# Patient Record
Sex: Female | Born: 1945
Health system: Southern US, Community
[De-identification: ages and names within clinical notes are randomized; demographics above are authoritative.]

## PROBLEM LIST (undated history)

## (undated) DIAGNOSIS — J189 Pneumonia, unspecified organism: Secondary | ICD-10-CM

## (undated) DIAGNOSIS — K5790 Diverticulosis of intestine, part unspecified, without perforation or abscess without bleeding: Secondary | ICD-10-CM

## (undated) DIAGNOSIS — G4730 Sleep apnea, unspecified: Secondary | ICD-10-CM

## (undated) DIAGNOSIS — K529 Noninfective gastroenteritis and colitis, unspecified: Secondary | ICD-10-CM

## (undated) DIAGNOSIS — J329 Chronic sinusitis, unspecified: Secondary | ICD-10-CM

## (undated) DIAGNOSIS — C50919 Malignant neoplasm of unspecified site of unspecified female breast: Secondary | ICD-10-CM

## (undated) DIAGNOSIS — E785 Hyperlipidemia, unspecified: Secondary | ICD-10-CM

## (undated) DIAGNOSIS — G5601 Carpal tunnel syndrome, right upper limb: Secondary | ICD-10-CM

## (undated) DIAGNOSIS — I890 Lymphedema, not elsewhere classified: Secondary | ICD-10-CM

## (undated) DIAGNOSIS — M1990 Unspecified osteoarthritis, unspecified site: Secondary | ICD-10-CM

## (undated) DIAGNOSIS — K635 Polyp of colon: Secondary | ICD-10-CM

## (undated) DIAGNOSIS — K120 Recurrent oral aphthae: Secondary | ICD-10-CM

## (undated) DIAGNOSIS — IMO0002 Reserved for concepts with insufficient information to code with codable children: Secondary | ICD-10-CM

## (undated) DIAGNOSIS — I10 Essential (primary) hypertension: Secondary | ICD-10-CM

## (undated) DIAGNOSIS — T7840XA Allergy, unspecified, initial encounter: Secondary | ICD-10-CM

## (undated) DIAGNOSIS — K219 Gastro-esophageal reflux disease without esophagitis: Secondary | ICD-10-CM

## (undated) HISTORY — DX: Allergy, unspecified, initial encounter: T78.40XA

## (undated) HISTORY — DX: Carpal tunnel syndrome, right upper limb: G56.01

## (undated) HISTORY — PX: SKIN GRAFT: SHX250

## (undated) HISTORY — DX: Diverticulosis of intestine, part unspecified, without perforation or abscess without bleeding: K57.90

## (undated) HISTORY — PX: AUGMENTATION MAMMAPLASTY: SUR837

## (undated) HISTORY — DX: Hyperlipidemia, unspecified: E78.5

## (undated) HISTORY — DX: Noninfective gastroenteritis and colitis, unspecified: K52.9

## (undated) HISTORY — DX: Essential (primary) hypertension: I10

## (undated) HISTORY — DX: Reserved for concepts with insufficient information to code with codable children: IMO0002

## (undated) HISTORY — PX: MASTECTOMY: SHX3

## (undated) HISTORY — DX: Sleep apnea, unspecified: G47.30

## (undated) HISTORY — DX: Polyp of colon: K63.5

## (undated) HISTORY — PX: TUBAL LIGATION: SHX77

## (undated) HISTORY — PX: REDUCTION MAMMAPLASTY: SUR839

## (undated) HISTORY — DX: Pneumonia, unspecified organism: J18.9

## (undated) HISTORY — DX: Unspecified osteoarthritis, unspecified site: M19.90

## (undated) HISTORY — DX: Lymphedema, not elsewhere classified: I89.0

## (undated) HISTORY — PX: TONSILLECTOMY: SUR1361

## (undated) HISTORY — PX: RECONSTRUCTION BREAST IMMEDIATE / DELAYED W/ TISSUE EXPANDER: SUR1077

## (undated) HISTORY — DX: Malignant neoplasm of unspecified site of unspecified female breast: C50.919

## (undated) HISTORY — PX: SQUAMOUS CELL CARCINOMA EXCISION: SHX2433

## (undated) HISTORY — DX: Gastro-esophageal reflux disease without esophagitis: K21.9

## (undated) HISTORY — DX: Recurrent oral aphthae: K12.0

## (undated) HISTORY — DX: Chronic sinusitis, unspecified: J32.9

---

## 1998-10-19 ENCOUNTER — Other Ambulatory Visit: Admission: RE | Admit: 1998-10-19 | Discharge: 1998-10-19 | Payer: Self-pay | Admitting: Obstetrics and Gynecology

## 2001-12-08 ENCOUNTER — Other Ambulatory Visit: Admission: RE | Admit: 2001-12-08 | Discharge: 2001-12-08 | Payer: Self-pay | Admitting: Obstetrics and Gynecology

## 2002-10-13 LAB — HM COLONOSCOPY

## 2003-01-13 ENCOUNTER — Other Ambulatory Visit: Admission: RE | Admit: 2003-01-13 | Discharge: 2003-01-13 | Payer: Self-pay | Admitting: Obstetrics and Gynecology

## 2004-03-27 ENCOUNTER — Other Ambulatory Visit: Admission: RE | Admit: 2004-03-27 | Discharge: 2004-03-27 | Payer: Self-pay | Admitting: Obstetrics and Gynecology

## 2004-11-07 ENCOUNTER — Ambulatory Visit: Payer: Self-pay | Admitting: Internal Medicine

## 2004-11-14 ENCOUNTER — Ambulatory Visit: Payer: Self-pay | Admitting: Internal Medicine

## 2005-02-03 ENCOUNTER — Ambulatory Visit: Payer: Self-pay | Admitting: Internal Medicine

## 2005-04-02 ENCOUNTER — Other Ambulatory Visit: Admission: RE | Admit: 2005-04-02 | Discharge: 2005-04-02 | Payer: Self-pay | Admitting: Obstetrics and Gynecology

## 2005-04-04 ENCOUNTER — Ambulatory Visit: Payer: Self-pay | Admitting: Internal Medicine

## 2005-06-30 ENCOUNTER — Ambulatory Visit: Payer: Self-pay | Admitting: Internal Medicine

## 2006-03-13 ENCOUNTER — Ambulatory Visit: Payer: Self-pay | Admitting: Internal Medicine

## 2006-03-26 ENCOUNTER — Ambulatory Visit: Payer: Self-pay | Admitting: Internal Medicine

## 2006-09-15 ENCOUNTER — Ambulatory Visit: Payer: Self-pay | Admitting: Internal Medicine

## 2006-09-15 LAB — CONVERTED CEMR LAB
HDL: 42.5 mg/dL (ref >39.0)
Triglyceride fasting, serum: 71 mg/dL (ref 0–149)

## 2006-09-30 ENCOUNTER — Ambulatory Visit: Payer: Self-pay | Admitting: Internal Medicine

## 2007-02-15 ENCOUNTER — Encounter: Payer: Self-pay | Admitting: Internal Medicine

## 2007-02-15 DIAGNOSIS — K219 Gastro-esophageal reflux disease without esophagitis: Secondary | ICD-10-CM | POA: Insufficient documentation

## 2007-02-15 DIAGNOSIS — I10 Essential (primary) hypertension: Secondary | ICD-10-CM | POA: Insufficient documentation

## 2007-02-15 DIAGNOSIS — R7301 Impaired fasting glucose: Secondary | ICD-10-CM | POA: Insufficient documentation

## 2007-02-15 DIAGNOSIS — E785 Hyperlipidemia, unspecified: Secondary | ICD-10-CM | POA: Insufficient documentation

## 2007-02-15 DIAGNOSIS — M1990 Unspecified osteoarthritis, unspecified site: Secondary | ICD-10-CM | POA: Insufficient documentation

## 2007-02-15 DIAGNOSIS — J309 Allergic rhinitis, unspecified: Secondary | ICD-10-CM | POA: Insufficient documentation

## 2007-04-07 ENCOUNTER — Ambulatory Visit: Payer: Self-pay | Admitting: Internal Medicine

## 2007-04-07 LAB — CONVERTED CEMR LAB
Bilirubin, Direct: 0.1 mg/dL (ref 0.0–0.3)
Hgb A1c MFr Bld: 6.5 % — ABNORMAL HIGH (ref 4.6–6.0)
Total Protein: 6.8 g/dL (ref 6.0–8.3)

## 2007-04-26 ENCOUNTER — Ambulatory Visit: Payer: Self-pay | Admitting: Internal Medicine

## 2007-11-02 ENCOUNTER — Encounter: Payer: Self-pay | Admitting: Internal Medicine

## 2007-12-07 ENCOUNTER — Ambulatory Visit: Payer: Self-pay | Admitting: Internal Medicine

## 2007-12-14 ENCOUNTER — Telehealth: Payer: Self-pay | Admitting: Internal Medicine

## 2007-12-14 LAB — CONVERTED CEMR LAB
Chloride: 101 meq/L (ref 96–112)
Creatinine, Ser: 0.8 mg/dL (ref 0.4–1.2)
Glucose, Bld: 126 mg/dL — ABNORMAL HIGH (ref 70–99)
HDL: 54.1 mg/dL (ref >39.0)
Hgb A1c MFr Bld: 7 % — ABNORMAL HIGH (ref 4.6–6.0)
LDL Cholesterol: 88 mg/dL (ref 0–99)
TSH: 1.98 microintl units/mL (ref 0.35–5.50)
Total CHOL/HDL Ratio: 3
Triglycerides: 113 mg/dL (ref 0–149)

## 2007-12-22 ENCOUNTER — Ambulatory Visit: Payer: Self-pay | Admitting: Internal Medicine

## 2007-12-22 DIAGNOSIS — M79609 Pain in unspecified limb: Secondary | ICD-10-CM | POA: Insufficient documentation

## 2007-12-22 DIAGNOSIS — F4389 Other reactions to severe stress: Secondary | ICD-10-CM | POA: Insufficient documentation

## 2007-12-22 DIAGNOSIS — F438 Other reactions to severe stress: Secondary | ICD-10-CM

## 2007-12-22 DIAGNOSIS — T887XXA Unspecified adverse effect of drug or medicament, initial encounter: Secondary | ICD-10-CM | POA: Insufficient documentation

## 2008-01-03 ENCOUNTER — Telehealth: Payer: Self-pay | Admitting: Internal Medicine

## 2008-02-14 ENCOUNTER — Ambulatory Visit: Payer: Self-pay | Admitting: Internal Medicine

## 2008-02-14 DIAGNOSIS — N390 Urinary tract infection, site not specified: Secondary | ICD-10-CM | POA: Insufficient documentation

## 2008-02-14 LAB — CONVERTED CEMR LAB
Bilirubin Urine: NEGATIVE
Glucose, Urine, Semiquant: NEGATIVE
Nitrite: POSITIVE
Specific Gravity, Urine: 1.015
Urobilinogen, UA: 1

## 2008-02-15 ENCOUNTER — Encounter: Payer: Self-pay | Admitting: Internal Medicine

## 2008-04-28 ENCOUNTER — Ambulatory Visit: Payer: Self-pay | Admitting: Internal Medicine

## 2008-04-28 LAB — CONVERTED CEMR LAB
AST: 32 units/L (ref 0–37)
Albumin: 4.3 g/dL (ref 3.5–5.2)
Bilirubin, Direct: 0.1 mg/dL (ref 0.0–0.3)
Cholesterol: 176 mg/dL (ref 0–200)
Creatinine,U: 140 mg/dL
HDL: 48.6 mg/dL (ref >39.0)
Hgb A1c MFr Bld: 6.9 % — ABNORMAL HIGH (ref 4.6–6.0)
Microalb, Ur: 0.6 mg/dL (ref 0.0–1.9)
TSH: 1.5 microintl units/mL (ref 0.35–5.50)
Total CHOL/HDL Ratio: 3.6
Total Protein: 6.9 g/dL (ref 6.0–8.3)
Triglycerides: 91 mg/dL (ref 0–149)

## 2008-05-05 ENCOUNTER — Ambulatory Visit: Payer: Self-pay | Admitting: Internal Medicine

## 2008-08-30 ENCOUNTER — Ambulatory Visit: Payer: Self-pay | Admitting: Internal Medicine

## 2008-08-30 LAB — CONVERTED CEMR LAB
AST: 31 units/L (ref 0–37)
Albumin: 4.2 g/dL (ref 3.5–5.2)
Total Protein: 6.9 g/dL (ref 6.0–8.3)

## 2008-09-05 ENCOUNTER — Telehealth: Payer: Self-pay | Admitting: Internal Medicine

## 2008-09-12 ENCOUNTER — Ambulatory Visit: Payer: Self-pay | Admitting: Internal Medicine

## 2008-10-13 DIAGNOSIS — C50919 Malignant neoplasm of unspecified site of unspecified female breast: Secondary | ICD-10-CM

## 2008-10-13 DIAGNOSIS — K120 Recurrent oral aphthae: Secondary | ICD-10-CM

## 2008-10-13 HISTORY — DX: Malignant neoplasm of unspecified site of unspecified female breast: C50.919

## 2008-10-13 HISTORY — DX: Recurrent oral aphthae: K12.0

## 2009-02-14 ENCOUNTER — Ambulatory Visit: Payer: Self-pay | Admitting: Internal Medicine

## 2009-02-14 LAB — CONVERTED CEMR LAB
AST: 25 units/L (ref 0–37)
Alkaline Phosphatase: 58 units/L (ref 39–117)
BUN: 12 mg/dL (ref 6–23)
Creatinine, Ser: 0.7 mg/dL (ref 0.4–1.2)
Creatinine,U: 122.8 mg/dL
Glucose, Bld: 145 mg/dL — ABNORMAL HIGH (ref 70–99)
HDL: 52.6 mg/dL (ref >39.00)
Hgb A1c MFr Bld: 6.6 % — ABNORMAL HIGH (ref 4.6–6.5)
Microalb Creat Ratio: 4.9 mg/g (ref 0.0–30.0)
Microalb, Ur: 0.6 mg/dL (ref 0.0–1.9)
Potassium: 4.3 meq/L (ref 3.5–5.1)
Total Bilirubin: 1.1 mg/dL (ref 0.3–1.2)

## 2009-02-21 ENCOUNTER — Ambulatory Visit: Payer: Self-pay | Admitting: Internal Medicine

## 2009-02-21 DIAGNOSIS — J310 Chronic rhinitis: Secondary | ICD-10-CM | POA: Insufficient documentation

## 2009-03-01 ENCOUNTER — Encounter: Payer: Self-pay | Admitting: Internal Medicine

## 2009-04-04 ENCOUNTER — Encounter: Admission: RE | Admit: 2009-04-04 | Discharge: 2009-04-04 | Payer: Self-pay | Admitting: Obstetrics and Gynecology

## 2009-04-04 ENCOUNTER — Encounter (INDEPENDENT_AMBULATORY_CARE_PROVIDER_SITE_OTHER): Payer: Self-pay | Admitting: Diagnostic Radiology

## 2009-04-07 ENCOUNTER — Telehealth: Payer: Self-pay | Admitting: Internal Medicine

## 2009-04-07 DIAGNOSIS — C50919 Malignant neoplasm of unspecified site of unspecified female breast: Secondary | ICD-10-CM | POA: Insufficient documentation

## 2009-04-10 ENCOUNTER — Encounter: Payer: Self-pay | Admitting: Internal Medicine

## 2009-04-11 ENCOUNTER — Encounter: Admission: RE | Admit: 2009-04-11 | Discharge: 2009-04-11 | Payer: Self-pay | Admitting: Obstetrics and Gynecology

## 2009-04-12 ENCOUNTER — Ambulatory Visit: Payer: Self-pay | Admitting: Oncology

## 2009-04-12 LAB — COMPREHENSIVE METABOLIC PANEL
ALT: 26 U/L (ref 0–35)
AST: 23 U/L (ref 0–37)
Alkaline Phosphatase: 67 U/L (ref 39–117)
CO2: 25 mEq/L (ref 19–32)
Creatinine, Ser: 0.66 mg/dL (ref 0.40–1.20)
Sodium: 140 mEq/L (ref 135–145)
Total Bilirubin: 1.2 mg/dL (ref 0.3–1.2)

## 2009-04-12 LAB — CBC WITH DIFFERENTIAL/PLATELET
BASO%: 0.4 % (ref 0.0–2.0)
EOS%: 1.6 % (ref 0.0–7.0)
HCT: 41.1 % (ref 34.8–46.6)
LYMPH%: 28.1 % (ref 14.0–49.7)
MCH: 32 pg (ref 25.1–34.0)
MCHC: 35.5 g/dL (ref 31.5–36.0)
MCV: 90.1 fL (ref 79.5–101.0)
MONO%: 7.6 % (ref 0.0–14.0)
NEUT%: 62.3 % (ref 38.4–76.8)
Platelets: 219 10*3/uL (ref 145–400)
RBC: 4.57 10*6/uL (ref 3.70–5.45)

## 2009-04-12 LAB — LACTATE DEHYDROGENASE: LDH: 126 U/L (ref 94–250)

## 2009-04-16 ENCOUNTER — Encounter: Payer: Self-pay | Admitting: Internal Medicine

## 2009-04-19 ENCOUNTER — Ambulatory Visit (HOSPITAL_COMMUNITY): Admission: RE | Admit: 2009-04-19 | Discharge: 2009-04-19 | Payer: Self-pay | Admitting: Oncology

## 2009-04-19 ENCOUNTER — Encounter: Payer: Self-pay | Admitting: Oncology

## 2009-04-20 ENCOUNTER — Ambulatory Visit (HOSPITAL_COMMUNITY): Admission: RE | Admit: 2009-04-20 | Discharge: 2009-04-20 | Payer: Self-pay | Admitting: Oncology

## 2009-04-23 ENCOUNTER — Ambulatory Visit (HOSPITAL_BASED_OUTPATIENT_CLINIC_OR_DEPARTMENT_OTHER): Admission: RE | Admit: 2009-04-23 | Discharge: 2009-04-23 | Payer: Self-pay | Admitting: Surgery

## 2009-05-01 ENCOUNTER — Ambulatory Visit: Payer: Self-pay | Admitting: Internal Medicine

## 2009-05-05 ENCOUNTER — Encounter: Payer: Self-pay | Admitting: Internal Medicine

## 2009-05-05 ENCOUNTER — Ambulatory Visit (HOSPITAL_BASED_OUTPATIENT_CLINIC_OR_DEPARTMENT_OTHER): Admission: RE | Admit: 2009-05-05 | Discharge: 2009-05-05 | Payer: Self-pay | Admitting: Internal Medicine

## 2009-05-09 ENCOUNTER — Encounter: Payer: Self-pay | Admitting: Internal Medicine

## 2009-05-09 LAB — URINALYSIS, MICROSCOPIC - CHCC
Bilirubin (Urine): NEGATIVE
Ketones: NEGATIVE mg/dL
Nitrite: NEGATIVE
Specific Gravity, Urine: 1.005 (ref 1.003–1.035)

## 2009-05-09 LAB — CBC WITH DIFFERENTIAL/PLATELET
Basophils Absolute: 0 10*3/uL (ref 0.0–0.1)
Eosinophils Absolute: 0.1 10*3/uL (ref 0.0–0.5)
HCT: 36.2 % (ref 34.8–46.6)
HGB: 12.5 g/dL (ref 11.6–15.9)
MONO#: 0.6 10*3/uL (ref 0.1–0.9)
NEUT%: 79.3 % — ABNORMAL HIGH (ref 38.4–76.8)
Platelets: 144 10*3/uL — ABNORMAL LOW (ref 145–400)
WBC: 14.6 10*3/uL — ABNORMAL HIGH (ref 3.9–10.3)
lymph#: 2.3 10*3/uL (ref 0.9–3.3)

## 2009-05-09 LAB — COMPREHENSIVE METABOLIC PANEL
ALT: 17 U/L (ref 0–35)
BUN: 15 mg/dL (ref 6–23)
CO2: 26 mEq/L (ref 19–32)
Calcium: 9.4 mg/dL (ref 8.4–10.5)
Chloride: 103 mEq/L (ref 96–112)
Creatinine, Ser: 0.79 mg/dL (ref 0.40–1.20)
Glucose, Bld: 198 mg/dL — ABNORMAL HIGH (ref 70–99)

## 2009-05-12 ENCOUNTER — Ambulatory Visit: Payer: Self-pay | Admitting: Internal Medicine

## 2009-05-22 ENCOUNTER — Ambulatory Visit: Payer: Self-pay | Admitting: Oncology

## 2009-05-24 ENCOUNTER — Encounter: Payer: Self-pay | Admitting: Internal Medicine

## 2009-05-24 LAB — BASIC METABOLIC PANEL
CO2: 25 mEq/L (ref 19–32)
Chloride: 103 mEq/L (ref 96–112)
Glucose, Bld: 148 mg/dL — ABNORMAL HIGH (ref 70–99)
Potassium: 4.1 mEq/L (ref 3.5–5.3)
Sodium: 138 mEq/L (ref 135–145)

## 2009-05-24 LAB — CBC WITH DIFFERENTIAL/PLATELET
Basophils Absolute: 0.1 10*3/uL (ref 0.0–0.1)
Eosinophils Absolute: 0 10*3/uL (ref 0.0–0.5)
HGB: 11.9 g/dL (ref 11.6–15.9)
MONO#: 0.5 10*3/uL (ref 0.1–0.9)
NEUT#: 14 10*3/uL — ABNORMAL HIGH (ref 1.5–6.5)
RBC: 3.8 10*6/uL (ref 3.70–5.45)
RDW: 13.4 % (ref 11.2–14.5)
WBC: 16.4 10*3/uL — ABNORMAL HIGH (ref 3.9–10.3)
lymph#: 1.8 10*3/uL (ref 0.9–3.3)

## 2009-05-28 ENCOUNTER — Ambulatory Visit: Payer: Self-pay | Admitting: Internal Medicine

## 2009-05-28 DIAGNOSIS — G4733 Obstructive sleep apnea (adult) (pediatric): Secondary | ICD-10-CM | POA: Insufficient documentation

## 2009-05-31 ENCOUNTER — Encounter: Payer: Self-pay | Admitting: Internal Medicine

## 2009-05-31 LAB — CBC WITH DIFFERENTIAL/PLATELET
BASO%: 0.3 % (ref 0.0–2.0)
HCT: 32.5 % — ABNORMAL LOW (ref 34.8–46.6)
LYMPH%: 19.7 % (ref 14.0–49.7)
MCH: 30.5 pg (ref 25.1–34.0)
MCHC: 34.8 g/dL (ref 31.5–36.0)
MCV: 87.6 fL (ref 79.5–101.0)
MONO#: 0.1 10*3/uL (ref 0.1–0.9)
MONO%: 2.1 % (ref 0.0–14.0)
NEUT%: 77.6 % — ABNORMAL HIGH (ref 38.4–76.8)
Platelets: 91 10*3/uL — ABNORMAL LOW (ref 145–400)
WBC: 3.9 10*3/uL (ref 3.9–10.3)

## 2009-06-06 ENCOUNTER — Encounter: Payer: Self-pay | Admitting: Internal Medicine

## 2009-06-06 LAB — CBC WITH DIFFERENTIAL/PLATELET
Basophils Absolute: 0 10*3/uL (ref 0.0–0.1)
EOS%: 0.2 % (ref 0.0–7.0)
HGB: 11.3 g/dL — ABNORMAL LOW (ref 11.6–15.9)
MCH: 31.9 pg (ref 25.1–34.0)
MCV: 91.1 fL (ref 79.5–101.0)
MONO%: 2.7 % (ref 0.0–14.0)
NEUT%: 88.4 % — ABNORMAL HIGH (ref 38.4–76.8)
RDW: 14.1 % (ref 11.2–14.5)

## 2009-06-06 LAB — COMPREHENSIVE METABOLIC PANEL
AST: 22 U/L (ref 0–37)
Alkaline Phosphatase: 80 U/L (ref 39–117)
BUN: 7 mg/dL (ref 6–23)
Creatinine, Ser: 0.82 mg/dL (ref 0.40–1.20)
Potassium: 3.4 mEq/L — ABNORMAL LOW (ref 3.5–5.3)

## 2009-06-12 ENCOUNTER — Encounter: Admission: RE | Admit: 2009-06-12 | Discharge: 2009-06-12 | Payer: Self-pay | Admitting: Oncology

## 2009-06-13 LAB — HM DIABETES EYE EXAM

## 2009-06-14 ENCOUNTER — Encounter: Payer: Self-pay | Admitting: Internal Medicine

## 2009-06-14 LAB — CBC WITH DIFFERENTIAL/PLATELET
Eosinophils Absolute: 0 10*3/uL (ref 0.0–0.5)
LYMPH%: 16 % (ref 14.0–49.7)
MCHC: 34.7 g/dL (ref 31.5–36.0)
MCV: 88.3 fL (ref 79.5–101.0)
MONO%: 3.3 % (ref 0.0–14.0)
NEUT#: 2.7 10*3/uL (ref 1.5–6.5)
NEUT%: 80.1 % — ABNORMAL HIGH (ref 38.4–76.8)
Platelets: 109 10*3/uL — ABNORMAL LOW (ref 145–400)
RBC: 3.49 10*6/uL — ABNORMAL LOW (ref 3.70–5.45)

## 2009-06-19 ENCOUNTER — Ambulatory Visit: Payer: Self-pay | Admitting: Oncology

## 2009-06-21 ENCOUNTER — Encounter: Payer: Self-pay | Admitting: Internal Medicine

## 2009-06-21 LAB — CBC WITH DIFFERENTIAL/PLATELET
BASO%: 0.4 % (ref 0.0–2.0)
LYMPH%: 3.7 % — ABNORMAL LOW (ref 14.0–49.7)
MCHC: 33.9 g/dL (ref 31.5–36.0)
MONO#: 1.9 10*3/uL — ABNORMAL HIGH (ref 0.1–0.9)
Platelets: 159 10*3/uL (ref 145–400)
RBC: 3.41 10*6/uL — ABNORMAL LOW (ref 3.70–5.45)
WBC: 39 10*3/uL — ABNORMAL HIGH (ref 3.9–10.3)

## 2009-06-21 LAB — COMPREHENSIVE METABOLIC PANEL
ALT: 22 U/L (ref 0–35)
Alkaline Phosphatase: 86 U/L (ref 39–117)
CO2: 24 mEq/L (ref 19–32)
Sodium: 137 mEq/L (ref 135–145)
Total Bilirubin: 0.4 mg/dL (ref 0.3–1.2)
Total Protein: 6.1 g/dL (ref 6.0–8.3)

## 2009-06-27 ENCOUNTER — Encounter: Payer: Self-pay | Admitting: Internal Medicine

## 2009-06-27 LAB — CBC WITH DIFFERENTIAL/PLATELET
BASO%: 1.3 % (ref 0.0–2.0)
EOS%: 0.6 % (ref 0.0–7.0)
LYMPH%: 22.6 % (ref 14.0–49.7)
MCH: 30.7 pg (ref 25.1–34.0)
MCHC: 34.1 g/dL (ref 31.5–36.0)
MCV: 90.1 fL (ref 79.5–101.0)
MONO%: 5.8 % (ref 0.0–14.0)
Platelets: 140 10*3/uL — ABNORMAL LOW (ref 145–400)
RBC: 3.35 10*6/uL — ABNORMAL LOW (ref 3.70–5.45)
WBC: 3.1 10*3/uL — ABNORMAL LOW (ref 3.9–10.3)

## 2009-07-05 ENCOUNTER — Encounter: Payer: Self-pay | Admitting: Internal Medicine

## 2009-07-05 LAB — COMPREHENSIVE METABOLIC PANEL
ALT: 24 U/L (ref 0–35)
AST: 14 U/L (ref 0–37)
CO2: 21 mEq/L (ref 19–32)
Chloride: 103 mEq/L (ref 96–112)
Sodium: 137 mEq/L (ref 135–145)
Total Bilirubin: 0.5 mg/dL (ref 0.3–1.2)
Total Protein: 6.3 g/dL (ref 6.0–8.3)

## 2009-07-05 LAB — CBC WITH DIFFERENTIAL/PLATELET
BASO%: 0.5 % (ref 0.0–2.0)
EOS%: 0 % (ref 0.0–7.0)
MCHC: 33.9 g/dL (ref 31.5–36.0)
MONO#: 0.6 10*3/uL (ref 0.1–0.9)
RBC: 3.28 10*6/uL — ABNORMAL LOW (ref 3.70–5.45)
WBC: 4.3 10*3/uL (ref 3.9–10.3)
lymph#: 0.6 10*3/uL — ABNORMAL LOW (ref 0.9–3.3)

## 2009-07-12 ENCOUNTER — Encounter: Payer: Self-pay | Admitting: Internal Medicine

## 2009-07-12 LAB — CBC WITH DIFFERENTIAL/PLATELET
EOS%: 0.2 % (ref 0.0–7.0)
Eosinophils Absolute: 0 10*3/uL (ref 0.0–0.5)
HGB: 10.2 g/dL — ABNORMAL LOW (ref 11.6–15.9)
MCH: 32 pg (ref 25.1–34.0)
MCV: 91.4 fL (ref 79.5–101.0)
MONO%: 0.3 % (ref 0.0–14.0)
NEUT#: 14.5 10*3/uL — ABNORMAL HIGH (ref 1.5–6.5)
RBC: 3.19 10*6/uL — ABNORMAL LOW (ref 3.70–5.45)
RDW: 16.5 % — ABNORMAL HIGH (ref 11.2–14.5)
lymph#: 1.4 10*3/uL (ref 0.9–3.3)

## 2009-07-16 ENCOUNTER — Ambulatory Visit: Payer: Self-pay | Admitting: Oncology

## 2009-07-17 ENCOUNTER — Ambulatory Visit: Payer: Self-pay | Admitting: Internal Medicine

## 2009-07-18 ENCOUNTER — Encounter: Payer: Self-pay | Admitting: Internal Medicine

## 2009-07-18 LAB — CBC WITH DIFFERENTIAL/PLATELET
BASO%: 0.3 % (ref 0.0–2.0)
Basophils Absolute: 0 10*3/uL (ref 0.0–0.1)
EOS%: 0.2 % (ref 0.0–7.0)
HCT: 30.4 % — ABNORMAL LOW (ref 34.8–46.6)
MCH: 31.7 pg (ref 25.1–34.0)
MCHC: 34.9 g/dL (ref 31.5–36.0)
MCV: 91 fL (ref 79.5–101.0)
MONO%: 3.2 % (ref 0.0–14.0)
NEUT%: 87.2 % — ABNORMAL HIGH (ref 38.4–76.8)
RDW: 17.5 % — ABNORMAL HIGH (ref 11.2–14.5)
lymph#: 1.5 10*3/uL (ref 0.9–3.3)

## 2009-07-18 LAB — COMPREHENSIVE METABOLIC PANEL
Albumin: 3.9 g/dL (ref 3.5–5.2)
Alkaline Phosphatase: 131 U/L — ABNORMAL HIGH (ref 39–117)
BUN: 8 mg/dL (ref 6–23)
Creatinine, Ser: 0.61 mg/dL (ref 0.40–1.20)
Glucose, Bld: 148 mg/dL — ABNORMAL HIGH (ref 70–99)
Total Bilirubin: 0.8 mg/dL (ref 0.3–1.2)

## 2009-07-26 ENCOUNTER — Encounter (INDEPENDENT_AMBULATORY_CARE_PROVIDER_SITE_OTHER): Payer: Self-pay | Admitting: *Deleted

## 2009-07-26 LAB — CBC WITH DIFFERENTIAL/PLATELET
Basophils Absolute: 0 10*3/uL (ref 0.0–0.1)
Eosinophils Absolute: 0 10*3/uL (ref 0.0–0.5)
HCT: 30.2 % — ABNORMAL LOW (ref 34.8–46.6)
HGB: 10.1 g/dL — ABNORMAL LOW (ref 11.6–15.9)
MCV: 92.4 fL (ref 79.5–101.0)
MONO%: 1.2 % (ref 0.0–14.0)
NEUT#: 10.6 10*3/uL — ABNORMAL HIGH (ref 1.5–6.5)
NEUT%: 94.2 % — ABNORMAL HIGH (ref 38.4–76.8)
Platelets: 179 10*3/uL (ref 145–400)
RDW: 18.2 % — ABNORMAL HIGH (ref 11.2–14.5)

## 2009-08-01 ENCOUNTER — Encounter (INDEPENDENT_AMBULATORY_CARE_PROVIDER_SITE_OTHER): Payer: Self-pay | Admitting: *Deleted

## 2009-08-01 LAB — CBC WITH DIFFERENTIAL/PLATELET
Basophils Absolute: 0 10*3/uL (ref 0.0–0.1)
Eosinophils Absolute: 0.1 10*3/uL (ref 0.0–0.5)
LYMPH%: 22.6 % (ref 14.0–49.7)
MCV: 92 fL (ref 79.5–101.0)
MONO%: 15.8 % — ABNORMAL HIGH (ref 0.0–14.0)
NEUT#: 2.5 10*3/uL (ref 1.5–6.5)
Platelets: 166 10*3/uL (ref 145–400)
RBC: 3.26 10*6/uL — ABNORMAL LOW (ref 3.70–5.45)

## 2009-08-03 ENCOUNTER — Ambulatory Visit: Payer: Self-pay | Admitting: Internal Medicine

## 2009-08-08 ENCOUNTER — Encounter (INDEPENDENT_AMBULATORY_CARE_PROVIDER_SITE_OTHER): Payer: Self-pay | Admitting: *Deleted

## 2009-08-08 LAB — CBC WITH DIFFERENTIAL/PLATELET
BASO%: 0.8 % (ref 0.0–2.0)
EOS%: 0.1 % (ref 0.0–7.0)
LYMPH%: 3.1 % — ABNORMAL LOW (ref 14.0–49.7)
MCHC: 33.8 g/dL (ref 31.5–36.0)
MCV: 95.5 fL (ref 79.5–101.0)
MONO%: 0.1 % (ref 0.0–14.0)
Platelets: 223 10*3/uL (ref 145–400)
RBC: 3.65 10*6/uL — ABNORMAL LOW (ref 3.70–5.45)
WBC: 19.1 10*3/uL — ABNORMAL HIGH (ref 3.9–10.3)

## 2009-08-08 LAB — COMPREHENSIVE METABOLIC PANEL
ALT: 27 U/L (ref 0–35)
Alkaline Phosphatase: 132 U/L — ABNORMAL HIGH (ref 39–117)
Sodium: 141 mEq/L (ref 135–145)
Total Bilirubin: 1 mg/dL (ref 0.3–1.2)
Total Protein: 7.2 g/dL (ref 6.0–8.3)

## 2009-08-10 ENCOUNTER — Encounter: Admission: RE | Admit: 2009-08-10 | Discharge: 2009-08-10 | Payer: Self-pay | Admitting: Oncology

## 2009-08-15 ENCOUNTER — Ambulatory Visit: Payer: Self-pay | Admitting: Oncology

## 2009-08-15 ENCOUNTER — Encounter (INDEPENDENT_AMBULATORY_CARE_PROVIDER_SITE_OTHER): Payer: Self-pay | Admitting: *Deleted

## 2009-08-15 LAB — CBC WITH DIFFERENTIAL/PLATELET
BASO%: 2.1 % — ABNORMAL HIGH (ref 0.0–2.0)
Basophils Absolute: 0 10*3/uL (ref 0.0–0.1)
EOS%: 0.5 % (ref 0.0–7.0)
HCT: 30 % — ABNORMAL LOW (ref 34.8–46.6)
HGB: 10 g/dL — ABNORMAL LOW (ref 11.6–15.9)
LYMPH%: 28.3 % (ref 14.0–49.7)
MCH: 31.2 pg (ref 25.1–34.0)
MCHC: 33.3 g/dL (ref 31.5–36.0)
MCV: 93.5 fL (ref 79.5–101.0)
NEUT%: 60.2 % (ref 38.4–76.8)
Platelets: 97 10*3/uL — ABNORMAL LOW (ref 145–400)

## 2009-08-16 ENCOUNTER — Ambulatory Visit: Admission: RE | Admit: 2009-08-16 | Discharge: 2009-09-26 | Payer: Self-pay | Admitting: Radiation Oncology

## 2009-08-17 ENCOUNTER — Encounter (INDEPENDENT_AMBULATORY_CARE_PROVIDER_SITE_OTHER): Payer: Self-pay | Admitting: *Deleted

## 2009-08-28 ENCOUNTER — Telehealth: Payer: Self-pay | Admitting: *Deleted

## 2009-08-31 ENCOUNTER — Ambulatory Visit: Payer: Self-pay | Admitting: Internal Medicine

## 2009-09-04 ENCOUNTER — Ambulatory Visit (HOSPITAL_BASED_OUTPATIENT_CLINIC_OR_DEPARTMENT_OTHER): Admission: RE | Admit: 2009-09-04 | Discharge: 2009-09-05 | Payer: Self-pay | Admitting: Surgery

## 2009-09-26 ENCOUNTER — Ambulatory Visit: Payer: Self-pay | Admitting: Oncology

## 2009-09-28 LAB — CBC WITH DIFFERENTIAL/PLATELET
BASO%: 0.6 % (ref 0.0–2.0)
EOS%: 7.8 % — ABNORMAL HIGH (ref 0.0–7.0)
HCT: 36.1 % (ref 34.8–46.6)
LYMPH%: 21.6 % (ref 14.0–49.7)
MCH: 30.8 pg (ref 25.1–34.0)
MCHC: 33.6 g/dL (ref 31.5–36.0)
MCV: 91.4 fL (ref 79.5–101.0)
MONO%: 6.7 % (ref 0.0–14.0)
NEUT%: 63.3 % (ref 38.4–76.8)
lymph#: 1.4 10*3/uL (ref 0.9–3.3)

## 2009-09-28 LAB — COMPREHENSIVE METABOLIC PANEL
ALT: 12 U/L (ref 0–35)
AST: 14 U/L (ref 0–37)
Alkaline Phosphatase: 75 U/L (ref 39–117)
Chloride: 103 mEq/L (ref 96–112)
Creatinine, Ser: 0.6 mg/dL (ref 0.40–1.20)
Total Bilirubin: 0.4 mg/dL (ref 0.3–1.2)

## 2009-10-03 ENCOUNTER — Ambulatory Visit: Admission: RE | Admit: 2009-10-03 | Discharge: 2009-10-12 | Payer: Self-pay | Admitting: Radiation Oncology

## 2009-10-04 ENCOUNTER — Encounter: Payer: Self-pay | Admitting: Internal Medicine

## 2009-10-15 ENCOUNTER — Ambulatory Visit: Admission: RE | Admit: 2009-10-15 | Discharge: 2009-10-16 | Payer: Self-pay | Admitting: Radiation Oncology

## 2009-10-17 ENCOUNTER — Encounter: Admission: RE | Admit: 2009-10-17 | Discharge: 2009-10-17 | Payer: Self-pay | Admitting: Oncology

## 2009-11-08 ENCOUNTER — Ambulatory Visit (HOSPITAL_BASED_OUTPATIENT_CLINIC_OR_DEPARTMENT_OTHER): Admission: RE | Admit: 2009-11-08 | Discharge: 2009-11-08 | Payer: Self-pay | Admitting: Surgery

## 2009-11-20 ENCOUNTER — Ambulatory Visit: Payer: Self-pay | Admitting: Oncology

## 2009-11-22 LAB — CBC WITH DIFFERENTIAL/PLATELET
BASO%: 0.5 % (ref 0.0–2.0)
HCT: 39.9 % (ref 34.8–46.6)
LYMPH%: 30 % (ref 14.0–49.7)
MCH: 30 pg (ref 25.1–34.0)
MCHC: 33.8 g/dL (ref 31.5–36.0)
MCV: 88.6 fL (ref 79.5–101.0)
MONO#: 0.5 10*3/uL (ref 0.1–0.9)
MONO%: 8.5 % (ref 0.0–14.0)
NEUT%: 56.2 % (ref 38.4–76.8)
Platelets: 235 10*3/uL (ref 145–400)
WBC: 6.4 10*3/uL (ref 3.9–10.3)

## 2009-11-23 LAB — COMPREHENSIVE METABOLIC PANEL
ALT: 23 U/L (ref 0–35)
CO2: 28 mEq/L (ref 19–32)
Creatinine, Ser: 0.64 mg/dL (ref 0.40–1.20)
Total Bilirubin: 0.4 mg/dL (ref 0.3–1.2)

## 2009-11-23 LAB — VITAMIN D 25 HYDROXY (VIT D DEFICIENCY, FRACTURES): Vit D, 25-Hydroxy: 42 ng/mL (ref 30–89)

## 2009-12-04 ENCOUNTER — Ambulatory Visit: Admission: RE | Admit: 2009-12-04 | Discharge: 2010-01-28 | Payer: Self-pay | Admitting: Radiation Oncology

## 2009-12-06 ENCOUNTER — Encounter: Payer: Self-pay | Admitting: Internal Medicine

## 2010-02-13 ENCOUNTER — Ambulatory Visit: Payer: Self-pay | Admitting: Oncology

## 2010-02-14 ENCOUNTER — Encounter: Payer: Self-pay | Admitting: Internal Medicine

## 2010-02-22 ENCOUNTER — Encounter: Payer: Self-pay | Admitting: Internal Medicine

## 2010-03-06 ENCOUNTER — Ambulatory Visit (HOSPITAL_BASED_OUTPATIENT_CLINIC_OR_DEPARTMENT_OTHER): Admission: RE | Admit: 2010-03-06 | Discharge: 2010-03-06 | Payer: Self-pay | Admitting: Surgery

## 2010-03-13 ENCOUNTER — Ambulatory Visit: Payer: Self-pay | Admitting: Internal Medicine

## 2010-03-13 LAB — CONVERTED CEMR LAB
ALT: 24 units/L (ref 0–35)
AST: 21 units/L (ref 0–37)
Albumin: 4.5 g/dL (ref 3.5–5.2)
Alkaline Phosphatase: 74 units/L (ref 39–117)
BUN: 16 mg/dL (ref 6–23)
Cholesterol: 216 mg/dL — ABNORMAL HIGH (ref 0–200)
Direct LDL: 133.8 mg/dL
HDL: 63.2 mg/dL (ref >39.00)
Sodium: 143 meq/L (ref 135–145)
Total CHOL/HDL Ratio: 3
Total Protein: 7 g/dL (ref 6.0–8.3)
Triglycerides: 116 mg/dL (ref 0.0–149.0)
VLDL: 23.2 mg/dL (ref 0.0–40.0)

## 2010-03-20 ENCOUNTER — Ambulatory Visit: Payer: Self-pay | Admitting: Internal Medicine

## 2010-04-26 ENCOUNTER — Ambulatory Visit: Payer: Self-pay | Admitting: Internal Medicine

## 2010-04-26 LAB — CONVERTED CEMR LAB
Bilirubin Urine: NEGATIVE
Glucose, Urine, Semiquant: NEGATIVE
Ketones, urine, test strip: NEGATIVE
Protein, U semiquant: NEGATIVE
Urobilinogen, UA: 0.2

## 2010-04-29 ENCOUNTER — Encounter: Admission: RE | Admit: 2010-04-29 | Discharge: 2010-04-29 | Payer: Self-pay | Admitting: Oncology

## 2010-06-07 ENCOUNTER — Encounter: Payer: Self-pay | Admitting: Internal Medicine

## 2010-06-19 ENCOUNTER — Ambulatory Visit: Payer: Self-pay | Admitting: Internal Medicine

## 2010-06-26 ENCOUNTER — Ambulatory Visit: Payer: Self-pay | Admitting: Internal Medicine

## 2010-07-08 ENCOUNTER — Encounter: Payer: Self-pay | Admitting: Internal Medicine

## 2010-07-11 ENCOUNTER — Encounter: Payer: Self-pay | Admitting: Internal Medicine

## 2010-08-16 ENCOUNTER — Ambulatory Visit: Payer: Self-pay | Admitting: Oncology

## 2010-08-20 ENCOUNTER — Ambulatory Visit: Payer: Self-pay | Admitting: Internal Medicine

## 2010-08-20 LAB — CONVERTED CEMR LAB
Bilirubin Urine: NEGATIVE
Ketones, urine, test strip: NEGATIVE
Nitrite: NEGATIVE
Specific Gravity, Urine: 1.015
pH: 7

## 2010-08-20 LAB — CBC WITH DIFFERENTIAL/PLATELET
Basophils Absolute: 0 10*3/uL (ref 0.0–0.1)
HCT: 39.2 % (ref 34.8–46.6)
HGB: 13.7 g/dL (ref 11.6–15.9)
MCH: 32 pg (ref 25.1–34.0)
MONO#: 0.4 10*3/uL (ref 0.1–0.9)
NEUT%: 65.5 % (ref 38.4–76.8)
WBC: 6 10*3/uL (ref 3.9–10.3)
lymph#: 1.5 10*3/uL (ref 0.9–3.3)

## 2010-08-21 LAB — LIPID PANEL
Cholesterol: 198 mg/dL (ref 0–200)
HDL: 61 mg/dL
LDL Cholesterol: 78 mg/dL (ref 0–99)
Total CHOL/HDL Ratio: 3.2 ratio
Triglycerides: 293 mg/dL — ABNORMAL HIGH
VLDL: 59 mg/dL — ABNORMAL HIGH (ref 0–40)

## 2010-08-21 LAB — COMPREHENSIVE METABOLIC PANEL
BUN: 11 mg/dL (ref 6–23)
CO2: 26 mEq/L (ref 19–32)
Calcium: 10 mg/dL (ref 8.4–10.5)
Chloride: 101 mEq/L (ref 96–112)
Creatinine, Ser: 0.75 mg/dL (ref 0.40–1.20)

## 2010-08-21 LAB — LACTATE DEHYDROGENASE: LDH: 132 U/L (ref 94–250)

## 2010-08-21 LAB — VITAMIN D 25 HYDROXY (VIT D DEFICIENCY, FRACTURES): Vit D, 25-Hydroxy: 58 ng/mL (ref 30–89)

## 2010-09-30 ENCOUNTER — Telehealth: Payer: Self-pay | Admitting: *Deleted

## 2010-09-30 ENCOUNTER — Encounter (INDEPENDENT_AMBULATORY_CARE_PROVIDER_SITE_OTHER): Payer: Self-pay | Admitting: *Deleted

## 2010-10-03 ENCOUNTER — Ambulatory Visit: Payer: Self-pay | Admitting: Internal Medicine

## 2010-11-03 ENCOUNTER — Encounter: Payer: Self-pay | Admitting: Radiology

## 2010-11-04 ENCOUNTER — Encounter: Payer: Self-pay | Admitting: Surgery

## 2010-11-04 ENCOUNTER — Telehealth: Payer: Self-pay | Admitting: *Deleted

## 2010-11-14 ENCOUNTER — Other Ambulatory Visit (INDEPENDENT_AMBULATORY_CARE_PROVIDER_SITE_OTHER): Payer: BC Managed Care – PPO | Admitting: Internal Medicine

## 2010-11-14 DIAGNOSIS — R739 Hyperglycemia, unspecified: Secondary | ICD-10-CM

## 2010-11-14 DIAGNOSIS — R7309 Other abnormal glucose: Secondary | ICD-10-CM

## 2010-11-14 LAB — BASIC METABOLIC PANEL
BUN: 14 mg/dL (ref 6–23)
Calcium: 9.5 mg/dL (ref 8.4–10.5)
GFR: 107.12 mL/min (ref >60.00)
Glucose, Bld: 116 mg/dL — ABNORMAL HIGH (ref 70–99)

## 2010-11-14 LAB — HEMOGLOBIN A1C: Hgb A1c MFr Bld: 5.7 % (ref 4.6–6.5)

## 2010-11-14 LAB — CBC WITH DIFFERENTIAL/PLATELET
Basophils Relative: 0.6 % (ref 0.0–3.0)
Eosinophils Relative: 3.5 % (ref 0.0–5.0)
HCT: 35.9 % — ABNORMAL LOW (ref 36.0–46.0)
Lymphs Abs: 1.1 10*3/uL (ref 0.7–4.0)
MCHC: 34.2 g/dL (ref 30.0–36.0)
MCV: 93.3 fl (ref 78.0–100.0)
Monocytes Absolute: 0.4 10*3/uL (ref 0.1–1.0)
Platelets: 232 10*3/uL (ref 150.0–400.0)
RBC: 3.84 Mil/uL — ABNORMAL LOW (ref 3.87–5.11)
WBC: 4.1 10*3/uL — ABNORMAL LOW (ref 4.5–10.5)

## 2010-11-14 LAB — TSH: TSH: 1.83 u[IU]/mL (ref 0.35–5.50)

## 2010-11-14 LAB — HEPATIC FUNCTION PANEL: Albumin: 4.5 g/dL (ref 3.5–5.2)

## 2010-11-14 NOTE — Letter (Signed)
Summary: Diabetic Eye Exam/[facility]  Diabetic Eye Exam/[facility]   Imported By: Maryln Gottron 07/16/2010 10:51:04  _____________________________________________________________________  External Attachment:    Type:   Image     Comment:   External Document

## 2010-11-14 NOTE — Assessment & Plan Note (Signed)
Summary: follow up on labs/ssc   Vital Signs:  Patient profile:   65 year old female Menstrual status:  postmenopausal Weight:      180 pounds Pulse rate:   66 / minute BP sitting:   118 / 70  (right arm) Cuff size:   regular  Vitals Entered By: Romualdo Bolk, CMA (AAMA) (March 20, 2010 11:44 AM) CC: Follow-up visit on labs, Hypertension Management   History of Present Illness: Chelsey [name] comes in today     for rov .   for multiple medical problems . Since last visit she has completed her chemoa nd radiation for her left breast cancer .  now has port out.  to do reconstruction in the fall.   Radiation    scar opened up and had complication. Better with hydrogel dressing.  BP   Back to   full dose and bp better  BG  up an down nl and then 200    at times  Eatring consistently end of march.   better taste and mouth sores..  Anxiety 3 x per week.   takes  med and asks for refill . COntinues to work  Hypertension History:      She denies headache, chest pain, palpitations, dyspnea with exertion, orthopnea, PND, peripheral edema, visual symptoms, neurologic problems, syncope, and side effects from treatment.  She notes no problems with any antihypertensive medication side effects.        Positive major cardiovascular risk factors include female age 56 years old or older, diabetes, hyperlipidemia, and hypertension.  Negative major cardiovascular risk factors include non-tobacco-user status.     Preventive Screening-Counseling & Management  Alcohol-Tobacco     Alcohol drinks/day: <1     Smoking Status: never  Caffeine-Diet-Exercise     Caffeine use/day: 2     Does Patient Exercise: no  Safety-Violence-Falls     Seat Belt Use: yes  Current Medications (verified): 1)  Crestor 10 Mg Tabs (Rosuvastatin Calcium) .Marland Kitchen.. 1 By Mouth Once Daily 2)  Zestril 20 Mg Tabs (Lisinopril) .Marland Kitchen.. 1  By Mouth Once Daily 3)  Alprazolam 0.25 Mg  Tabs (Alprazolam) .Marland Kitchen.. 1-2  By Mouth Three Times A Day As  Needed 4)  Metformin Hcl 750 Mg  Tb24 (Metformin Hcl) .Marland Kitchen.. 1 By Mouth Once Daily 5)  Voltaren 75 Mg  Tbec (Diclofenac Sodium) 6)  Nasonex 50 Mcg/act Susp (Mometasone Furoate) .... 2 Sprays in Each Nostril Daily 7)  Nexium 40 Mg Cpdr (Esomeprazole Magnesium) .... Take 1 Tablet By Mouth Once A Day As Needed 8)  Vitamin D 1000 Unit Tabs (Cholecalciferol) .... Take 1 Tablet By Mouth Once A Day 9)  Tums 500 Mg Chew (Calcium Carbonate Antacid) .... Take By Mouth As Needed 10)  Zoloft 25 Mg Tabs (Sertraline Hcl) 11)  Vicodin 5-500 Mg Tabs (Hydrocodone-Acetaminophen)  Allergies (verified): 1)  ! Penicillin 2)  Amoxicillin (Amoxicillin)  Past History:  Past medical, surgical, family and social histories (including risk factors) reviewed for relevance to current acute and chronic problems.  Past Medical History: hyperglycemia Hx colitis ? secondary to NSAIDS  MALIGNANT NEOPLASM OF BREAST UNSPECIFIED SITE (ICD-174.9) -Left- bx +, chemo SNORING (ICD-786.09) RHINITIS, CHRONIC (ICD-472.0) UNS ADVRS EFF UNS RX MEDICINAL&BIOLOGICAL SBSTNC (ICD-995.20) OTHER ACUTE REACTIONS TO STRESS (ICD-308.3) LEG PAIN, BILATERAL (ICD-729.5) IMPAIRED FASTING GLUCOSE (ICD-790.21) OSTEOARTHRITIS (ICD-715.90)------ Knees HYPERTENSION (ICD-401.9) HYPERLIPIDEMIA (ICD-272.4) GERD (ICD-530.81) ALLERGIC RHINITIS (ICD-477.9) Obstructive sleep apnea - NPSG 05/06/19- AHI 47.7/hr, CPAP 14 cwp/ AHI 0/hr  Past Surgical History:  Reviewed history from 08/31/2009 and no changes required. Childbirth x3 Caesarean section Breast biopsies  Left Mastectomy on 11/23  Past History:  Care Management: ENT: Dr. Jearld Fenton   Gynecology: Tenny Craw Dermatology: Mayford Knife Gastroenterology: Juanda Chance Hematology/Oncology: [facility]  Family History: Reviewed history from 05/01/2009 and no changes required. dm ht  mom has alzheimers  son died around 37 years secondary TGA pulmonary HT  Daughter with anxiety disorder Mom with   OSA   Social History: Reviewed history from 09/12/2008 and no changes required. 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         chest pain with stress   and  local scar problem.   Physical Exam  General:  alert, well-developed, well-nourished, and well-hydrated.   Head:  normocephalic and atraumatic.   Eyes:  vision grossly intact, pupils equal, and pupils round.   Neck:  No deformities, masses, or tenderness noted. Chest Wall:  radation changes left  Lungs:  Normal respiratory effort, chest expands symmetrically. Lungs are clear to auscultation, no crackles or wheezes.no dullness.   Heart:  Normal rate and regular rhythm. S1 and S2 normal without gallop, murmur, click, rub or other extra sounds.no lifts.   Abdomen:  Bowel sounds positive,abdomen soft and non-tender without masses, organomegaly or   noted. Pulses:  pulses intact without delay   Extremities:  no clubbing cyanosis or edema   no lymphedema left  Neurologic:  alert & oriented X3, strength normal in all extremities, and gait normal.   Skin:  turgor normal, color normal, no ecchymoses, and no petechiae.   Cervical Nodes:  No lymphadenopathy noted Psych:  Oriented X3, normally interactive, good eye contact, not anxious appearing, and not depressed appearing.     Impression & Recommendations:  Problem # 1:  HYPERTENSION (ICD-401.9) back on full dose and controlled after chemoa and surgery  Her updated medication list for this problem includes:    Zestril 20 Mg Tabs  (Lisinopril) .Marland Kitchen... 1  by mouth once daily  BP today: 118/70 Prior BP: 120/76 (08/31/2009)  10 Yr Risk Heart Disease: 8 % Prior 10 Yr Risk Heart Disease: 24 % (08/03/2009)  Labs Reviewed: K+: 4.5 (03/13/2010) Creat: : 0.6 (03/13/2010)   Chol: 216 (03/13/2010)   HDL: 63.20 (03/13/2010)   LDL: 119 (02/14/2009)   TG: 116.0 (03/13/2010)  Problem # 2:  IMPAIRED FASTING GLUCOSE (ICD-790.21) up and down  no a1c was done  but can increase metform in dose The following medications were removed from the medication list:    Metformin Hcl 750 Mg Tb24 (Metformin hcl) .Marland Kitchen... 1 by mouth once daily Her updated medication list for this problem includes:    Metformin Hcl 500 Mg Xr24h-tab (Metformin hcl) .Marland Kitchen... 2 by mouth once daily  Problem # 3:  HYPERLIPIDEMIA (ICD-272.4)  Her updated medication list for this problem includes:    Crestor 10 Mg Tabs (Rosuvastatin calcium) .Marland Kitchen... 1 by mouth once daily  Labs Reviewed: SGOT: 21 (03/13/2010)   SGPT: 24 (03/13/2010)  10 Yr Risk Heart Disease: 8 % Prior 10 Yr Risk Heart Disease: 24 % (08/03/2009)   HDL:63.20 (03/13/2010), 52.60 (02/14/2009)  LDL:119 (02/14/2009), 109 (04/28/2008)  Chol:216 (03/13/2010), 192 (02/14/2009)  Trig:116.0 (03/13/2010), 104.0 (02/14/2009)  Problem # 4:  MALIGNANT NEOPLASM OF BREAST UNSPECIFIED SITE (ICD-174.9) sp mastectomy axillary  exploration and radiation and chemotherapy.  had swound heeling complications   Problem # 5:  OTHER ACUTE REACTIONS TO STRESS (ICD-308.3) Assessment: Improved ok to use as needed benzo   Complete Medication List: 1)  Crestor 10 Mg Tabs (Rosuvastatin calcium) .Marland Kitchen.. 1 by mouth once daily 2)  Zestril 20 Mg Tabs (Lisinopril) .Marland Kitchen.. 1  by mouth once daily 3)  Alprazolam 0.25 Mg Tabs (Alprazolam) .Marland Kitchen.. 1-2  by mouth three times a day as needed 4)  Voltaren 75 Mg Tbec (Diclofenac sodium) 5)  Nasonex 50 Mcg/act Susp (Mometasone furoate) .... 2 sprays in each nostril daily 6)  Nexium 40 Mg Cpdr (Esomeprazole  magnesium) .... Take 1 tablet by mouth once a day as needed 7)  Vitamin D 1000 Unit Tabs (Cholecalciferol) .... Take 1 tablet by mouth once a day 8)  Tums 500 Mg Chew (Calcium carbonate antacid) .... Take by mouth as needed 9)  Zoloft 25 Mg Tabs (Sertraline hcl) 10)  Vicodin 5-500 Mg Tabs (Hydrocodone-acetaminophen) 11)  Metformin Hcl 500 Mg Xr24h-tab (Metformin hcl) .... 2 by mouth once daily  Hypertension Assessment/Plan:      The patient's hypertensive risk group is category C: Target organ damage and/or diabetes.  Her calculated 10 year risk of coronary heart disease is 8 %.  Today's blood pressure is 118/70.  Her blood pressure goal is < 130/80.  Patient Instructions: 1)  increase to  1000mg  per day metfromin  2)  hg a1c  in 3 months     and rov  3)  contiue other meds .  4)  call if  BG  consistently  over 200. Prescriptions: ALPRAZOLAM 0.25 MG  TABS (ALPRAZOLAM) 1-2  by mouth three times a day as needed  #60 x 1   Entered and Authorized by:   Madelin Headings MD   Signed by:   Madelin Headings MD on 03/20/2010   Method used:   Print then Give to Patient   RxID:   [id number] METFORMIN HCL 500 MG XR24H-TAB (METFORMIN HCL) 2 by mouth once daily  #60 x 6   Entered and Authorized by:   Madelin Headings MD   Signed by:   Madelin Headings MD on 03/20/2010   Method used:   Print then Give to Patient   RxID:   [id number]

## 2010-11-14 NOTE — Miscellaneous (Signed)
  Clinical Lists Changes 

## 2010-11-14 NOTE — Letter (Signed)
[facility]   Imported By: Maryln Gottron 01/03/2010 15:30:09  _____________________________________________________________________  External Attachment:    Type:   Image     Comment:   External Document

## 2010-11-14 NOTE — Assessment & Plan Note (Signed)
Summary: 3 month rov/njr   Vital Signs:  Patient profile:   65 year old female Menstrual status:  postmenopausal Height:      63 inches Weight:      181 pounds BMI:     32.18 O2 Sat:      98 % on Room air Temp:     99.5 degrees F oral Pulse rate:   68 / minute BP sitting:   120 / 80  (right arm) Cuff size:   regular  Vitals Entered By: Romualdo Bolk, CMA (AAMA) (June 26, 2010 10:08 AM)  O2 Flow:  Room air CC: Follow-up visit on labs and pt is also having coughing, congestion, wheezing in left lung. This started on 9/10 with a low grade temp off and on., Hypertension Management   History of Present Illness: Chelsey [name]   comes in today     for  follow up of a couple of medical problems. Since last visit she is diing fairly well but over the past 4-5  days   she developed   uri signs and cough and hoarse  .  increasing cough  and her    left lung feeling.   full?  NO real pain    dry cough.  No hemoptysis  temp 99. Family has had a coughing illness.   BG : up in  am 150 170 at times      taking  metformin without se  Breast cancer :   To have reconstruction  surgery  with Dr.   Jonna Munro   at [facility].   has begun pilates    and swimming also.  but not fully back to exercising  . ortho  joint. diclimitations. BP has been good.   Hypertension History:      She denies headache, chest pain, palpitations, dyspnea with exertion, orthopnea, PND, peripheral edema, visual symptoms, neurologic problems, syncope, and side effects from treatment.  She notes no problems with any antihypertensive medication side effects.        Positive major cardiovascular risk factors include female age 6 years old or older, diabetes, hyperlipidemia, and hypertension.  Negative major cardiovascular risk factors include non-tobacco-user status.     Preventive Screening-Counseling & Management  Alcohol-Tobacco     Alcohol drinks/day: <1     Smoking Status: never  Caffeine-Diet-Exercise  Caffeine use/day: 2     Does Patient Exercise: no  Current Medications (verified): 1)  Crestor 10 Mg Tabs (Rosuvastatin Calcium) .Marland Kitchen.. 1 By Mouth Once Daily 2)  Zestril 20 Mg Tabs (Lisinopril) .Marland Kitchen.. 1  By Mouth Once Daily 3)  Alprazolam 0.25 Mg  Tabs (Alprazolam) .Marland Kitchen.. 1-2  By Mouth Three Times A Day As Needed 4)  Voltaren 75 Mg  Tbec (Diclofenac Sodium) 5)  Nasonex 50 Mcg/act Susp (Mometasone Furoate) .... 2 Sprays in Each Nostril Daily 6)  Nexium 40 Mg Cpdr (Esomeprazole Magnesium) .... Take 1 Tablet By Mouth Once A Day As Needed 7)  Vitamin D 1000 Unit Tabs (Cholecalciferol) .... Take 1 Tablet By Mouth Once A Day 8)  Tums 500 Mg Chew (Calcium Carbonate Antacid) .... Take By Mouth As Needed 9)  Zoloft 25 Mg Tabs (Sertraline Hcl) .Marland Kitchen.. 1 By Mouth Once Daily 10)  Vicodin 5-500 Mg Tabs (Hydrocodone-Acetaminophen) 11)  Metformin Hcl 500 Mg Xr24h-Tab (Metformin Hcl) .... 2 By Mouth Once Daily 12)  Arimidex 1 Mg Tabs (Anastrozole) .Marland Kitchen.. 1 By Mouth Once Daily  Allergies (verified): 1)  ! Penicillin 2)  Amoxicillin (  Amoxicillin)  Past History:  Past medical, surgical, family and social histories (including risk factors) reviewed, and no changes noted (except as noted below).  Past Medical History: hyperglycemia diabetic range  at one point  Hx colitis ? secondary to NSAIDS  MALIGNANT NEOPLASM OF BREAST UNSPECIFIED SITE (ICD-174.9) -Left- bx +, chemo radiation SNORING (ICD-786.09) RHINITIS, CHRONIC (ICD-472.0) UNS ADVRS EFF UNS RX MEDICINAL&BIOLOGICAL SBSTNC (ICD-995.20) OTHER ACUTE REACTIONS TO STRESS (ICD-308.3) LEG PAIN, BILATERAL (ICD-729.5) IMPAIRED FASTING GLUCOSE (ICD-790.21) OSTEOARTHRITIS (ICD-715.90)------ Knees HYPERTENSION (ICD-401.9) HYPERLIPIDEMIA (ICD-272.4) GERD (ICD-530.81) ALLERGIC RHINITIS (ICD-477.9) Obstructive sleep apnea - NPSG 05/06/19- AHI 47.7/hr, CPAP 14 cwp/ AHI 0/hr  Past Surgical History: Reviewed history from 08/31/2009 and no changes required. Childbirth  x3 Caesarean section Breast biopsies  Left Mastectomy on 11/23  Past History:  Care Management: ENT: Dr. Jearld Fenton   Gynecology: Tenny Craw Dermatology: Mayford Knife Gastroenterology: Juanda Chance Hematology/Oncology: [facility] : Dr Jonna Munro  for breast reconst  Family History: Reviewed history from 05/01/2009 and no changes required. dm ht  mom has alzheimers  son died around 4 years secondary TGA pulmonary HT  Daughter with anxiety disorder Mom with  OSA   Social History: Reviewed history from 04/26/2010 and no changes required. Occupation: Veterinary surgeon working  Married Never Smoked son getting divorced and daughter in college mom with alzheimers in the home    Review of Systems  The patient denies anorexia, weight loss, vision loss, decreased hearing, headaches, hemoptysis, abdominal pain, transient blindness, difficulty walking, depression, abnormal bleeding, enlarged lymph nodes, and angioedema.         arthritis in knees no change  no  new neuropathy signs  no inew skin infections currently .  Physical Exam  General:  hoarse in nad  cough at times  bery congested  Head:  normocephalic and atraumatic.   Eyes:  PERRL, EOMs full, conjunctiva clear  Ears:  R ear normal, L ear normal, and no external deformities.   Nose:  congested no face pain Mouth:  pharynx pink and moist.   no lesions clear no edema Neck:  No deformities, masses, or tenderness noted. Lungs:  normal respiratory effort, no intercostal retractions, and no accessory muscle use.  dec bs wheezes and rhonchi bilaterally   some improved after nebulizer  Heart:  normal rate, regular rhythm, no murmur, and no lifts.   Abdomen:  Bowel sounds positive,abdomen soft and non-tender without masses, organomegaly or  s noted. Pulses:  nl cap refill  Extremities:  no clubbing cyanosis or edema  Neurologic:  non  focal  Cervical Nodes:  No lymphadenopathy noted Psych:  Oriented X3, normally interactive, good eye  contact, and not anxious appearing.     Impression & Recommendations:  Problem # 1:  IMPAIRED FASTING GLUCOSE (ICD-790.21) Assessment Improved hg a1c 6.1  has had diabetic readings   in the past     and now better  Her updated medication list for this problem includes:    Metformin Hcl 500 Mg Xr24h-tab (Metforminhas had diabetic readings in the past but hcl) .Marland Kitchen... 2 by mouth once daily  Labs Reviewed: Creat: 0.6 (03/13/2010)     Last Eye Exam: normal (06/13/2009)  Problem # 2:  MALIGNANT NEOPLASM OF BREAST UNSPECIFIED SITE (ICD-174.9) to do     reconsgtructive surgery  in NOvember   Problem # 3:  BRONCHITIS, ACUTE WITH MILD BRONCHOSPASM (ICD-466.0) vs early pneumonia    high risk with radiation hx    although  looks pretty good   rx for  atypicals with hx of family illness  and hoarseness has some reponse to nebulizer .  IF not getting better may need chest x Quashon Jesus etc. Her updated medication list for this problem includes:    Azithromycin 250 Mg Tabs (Azithromycin) .Marland Kitchen... Take 2 by mouth first day  then 1 by mouth once daily for 4 more days    Hydrocodone-homatropine 5-1.5 Mg/63ml Syrp (Hydrocodone-homatropine) .Marland Kitchen... 1-2 tsp by mouth q 4-6 hours as needed cough    Proair Hfa 108 (90 Base) Mcg/act Aers (Albuterol sulfate) .Marland Kitchen... 2 sprays  every 6 hours as needed wheezing  Complete Medication List: 1)  Crestor 10 Mg Tabs (Rosuvastatin calcium) .Marland Kitchen.. 1 by mouth once daily 2)  Zestril 20 Mg Tabs (Lisinopril) .Marland Kitchen.. 1  by mouth once daily 3)  Alprazolam 0.25 Mg Tabs (Alprazolam) .Marland Kitchen.. 1-2  by mouth three times a day as needed 4)  Voltaren 75 Mg Tbec (Diclofenac sodium) 5)  Nasonex 50 Mcg/act Susp (Mometasone furoate) .... 2 sprays in each nostril daily 6)  Nexium 40 Mg Cpdr (Esomeprazole magnesium) .... Take 1 tablet by mouth once a day as needed 7)  Vitamin D 1000 Unit Tabs (Cholecalciferol) .... Take 1 tablet by mouth once a day 8)  Tums 500 Mg Chew (Calcium carbonate antacid) .... Take by  mouth as needed 9)  Zoloft 25 Mg Tabs (Sertraline hcl) .Marland Kitchen.. 1 by mouth once daily 10)  Vicodin 5-500 Mg Tabs (Hydrocodone-acetaminophen) 11)  Metformin Hcl 500 Mg Xr24h-tab (Metformin hcl) .... 2 by mouth once daily 12)  Arimidex 1 Mg Tabs (Anastrozole) .Marland Kitchen.. 1 by mouth once daily 13)  Azithromycin 250 Mg Tabs (Azithromycin) .... Take 2 by mouth first day  then 1 by mouth once daily for 4 more days 14)  Hydrocodone-homatropine 5-1.5 Mg/19ml Syrp (Hydrocodone-homatropine) .Marland Kitchen.. 1-2 tsp by mouth q 4-6 hours as needed cough 15)  Proair Hfa 108 (90 Base) Mcg/act Aers (Albuterol sulfate) .... 2 sprays  every 6 hours as needed wheezing  Other Orders: Albuterol Sulfate Sol 1mg  unit dose (J8119) Admin of Therapeutic Inj  intramuscular or subcutaneous (14782) Nebulizer Tx (95621)  Hypertension Assessment/Plan:      The patient's hypertensive risk group is category C: Target organ damage and/or diabetes.  Her calculated 10 year risk of coronary heart disease is 13 %.  Today's blood pressure is 120/80.  Her blood pressure goal is < 130/80.  Patient Instructions: 1)  look into continue   water exercise  2)  take antibiotic   3)  cough med for comfort  4)  expect improvement in  3-4 days  5)  call as needed  6)  continut on metformin  7)  Let us know if we need to  do labs before surgery  in November  8)  other wise dues fot labs in december January   Prescriptions: PROAIR HFA 108 (90 BASE) MCG/ACT AERS (ALBUTEROL SULFATE) 2 sprays  every 6 hours as needed wheezing  #1 x 0   Entered and Authorized by:   Madelin Headings MD   Signed by:   Madelin Headings MD on 06/26/2010   Method used:   Electronically to        [facility]. [phone]* (retail)       [phone]       [place], [place]  [address]       Ph: [phone]       Fax: [phone]  RxID:   [id number] HYDROCODONE-HOMATROPINE 5-1.5 MG/5ML SYRP (HYDROCODONE-HOMATROPINE) 1-2 tsp by mouth q 4-6 hours  as needed cough  #6 oz x 0   Entered and Authorized by:   Madelin Headings MD   Signed by:   Madelin Headings MD on 06/26/2010   Method used:   Print then Give to Patient   RxID:   [id number] AZITHROMYCIN 250 MG TABS (AZITHROMYCIN) take 2 by mouth first day  then 1 by mouth once daily for 4 more days  #6 x 0   Entered and Authorized by:   Madelin Headings MD   Signed by:   Madelin Headings MD on 06/26/2010   Method used:   Electronically to        [facility]. [phone]* (retail)       [phone]       [place], [place]  [address]       Ph: [phone]       Fax: [phone]   RxID:   [id number]    Medication Administration  Medication # 1:    Medication: Albuterol Sulfate Sol 1mg  unit dose    Diagnosis: WHEEZING (ICD-786.07)    Dose: 3ml    Route: inhaled    Exp Date: 07/14/2011    Lot #: [id number]    Mfr: nephron    Patient tolerated medication without complications    Given by: Romualdo Bolk, CMA [facility]) (June 26, 2010 10:58 AM)  Orders Added: 1)  Albuterol Sulfate Sol 1mg  unit dose [J7613] 2)  Admin of Therapeutic Inj  intramuscular or subcutaneous [96372] 3)  Nebulizer Tx [94640] 4)  Est. Patient Level IV [06301]

## 2010-11-14 NOTE — Assessment & Plan Note (Signed)
Summary: ?uti/discomfort when urinating/cjr   Vital Signs:  Patient profile:   65 year old female Menstrual status:  postmenopausal Weight:      180 pounds Temp:     98.7 degrees F oral Pulse rate:   72 / minute BP sitting:   110 / 70  (right arm) Cuff size:   regular  Vitals Entered By: Romualdo Bolk, CMA (AAMA) (April 26, 2010 10:37 AM) CC: cramping, burning upon urination, bladder spasms off and on x 1 week   History of Present Illness: Chelsey [name] comes in for   sda acute visit  for above sx that   began a week ago and  then improved   However returned last night with dysuria frequency without back pain of fever  but" had a  bad last night.,"    no NVD sweats.  to go out of town this weekend.    Preventive Screening-Counseling & Management  Alcohol-Tobacco     Alcohol drinks/day: <1     Smoking Status: never  Caffeine-Diet-Exercise     Caffeine use/day: 2     Does Patient Exercise: no  Current Medications (verified): 1)  Crestor 10 Mg Tabs (Rosuvastatin Calcium) .Marland Kitchen.. 1 By Mouth Once Daily 2)  Zestril 20 Mg Tabs (Lisinopril) .Marland Kitchen.. 1  By Mouth Once Daily 3)  Alprazolam 0.25 Mg  Tabs (Alprazolam) .Marland Kitchen.. 1-2  By Mouth Three Times A Day As Needed 4)  Voltaren 75 Mg  Tbec (Diclofenac Sodium) 5)  Nasonex 50 Mcg/act Susp (Mometasone Furoate) .... 2 Sprays in Each Nostril Daily 6)  Nexium 40 Mg Cpdr (Esomeprazole Magnesium) .... Take 1 Tablet By Mouth Once A Day As Needed 7)  Vitamin D 1000 Unit Tabs (Cholecalciferol) .... Take 1 Tablet By Mouth Once A Day 8)  Tums 500 Mg Chew (Calcium Carbonate Antacid) .... Take By Mouth As Needed 9)  Zoloft 25 Mg Tabs (Sertraline Hcl) 10)  Vicodin 5-500 Mg Tabs (Hydrocodone-Acetaminophen) 11)  Metformin Hcl 500 Mg Xr24h-Tab (Metformin Hcl) .... 2 By Mouth Once Daily  Allergies (verified): 1)  ! Penicillin 2)  Amoxicillin (Amoxicillin)  Past History:  Past medical, surgical, family and social histories (including risk factors) reviewed for  relevance to current acute and chronic problems.  Past Medical History: Reviewed history from 03/20/2010 and no changes required. hyperglycemia Hx colitis ? secondary to NSAIDS  MALIGNANT NEOPLASM OF BREAST UNSPECIFIED SITE (ICD-174.9) -Left- bx +, chemo SNORING (ICD-786.09) RHINITIS, CHRONIC (ICD-472.0) UNS ADVRS EFF UNS RX MEDICINAL&BIOLOGICAL SBSTNC (ICD-995.20) OTHER ACUTE REACTIONS TO STRESS (ICD-308.3) LEG PAIN, BILATERAL (ICD-729.5) IMPAIRED FASTING GLUCOSE (ICD-790.21) OSTEOARTHRITIS (ICD-715.90)------ Knees HYPERTENSION (ICD-401.9) HYPERLIPIDEMIA (ICD-272.4) GERD (ICD-530.81) ALLERGIC RHINITIS (ICD-477.9) Obstructive sleep apnea - NPSG 05/06/19- AHI 47.7/hr, CPAP 14 cwp/ AHI 0/hr  Past Surgical History: Reviewed history from 08/31/2009 and no changes required. Childbirth x3 Caesarean section Breast biopsies  Left Mastectomy on 11/23  Past History:  Care Management: ENT: Dr. Jearld Fenton   Gynecology: Tenny Craw Dermatology: Mayford Knife Gastroenterology: Juanda Chance Hematology/Oncology: [facility]  Family History: Reviewed history from 05/01/2009 and no changes required. dm ht  mom has alzheimers  son died around 64 years secondary TGA pulmonary HT  Daughter with anxiety disorder Mom with  OSA   Social History: Reviewed history from 03/20/2010 and no changes required. 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         no flank pain  Physical Exam  General:  alert, well-developed, well-nourished, and well-hydrated.   Neck:  No deformities, masses, or tenderness noted. Abdomen:  Bowel sounds positive,abdomen soft and non-tender without masses, organomegaly or   noted. no flank pain  Extremities:  no clubbing cyanosis or edema    Skin:  turgor normal, color normal, no petechiae, and no purpura.   Cervical Nodes:  No lymphadenopathy noted Psych:  Oriented X3, normally interactive, good eye contact, not anxious appearing, and not depressed appearing.     Impression & Recommendations:  Problem # 1:  UTI (ICD-599.0) seems uncomplicated   although she has a number of comorbic conditions that seem to be stable currently   will cx and rx  .   Expectant management  Her updated medication list for this problem includes:    Nitrofurantoin Monohyd Macro 100 Mg Caps (Nitrofurantoin monohyd macro) .Marland Kitchen... 1 by mouth two times a day  Orders: UA Dipstick w/o Micro (automated)  (81003) T-Culture, Urine ([id number])  Problem # 2:  MALIGNANT NEOPLASM OF BREAST UNSPECIFIED SITE (ICD-174.9) s/p rx      Complete Medication List: 1)  Crestor 10 Mg Tabs (Rosuvastatin calcium) .Marland Kitchen.. 1 by mouth once daily 2)  Zestril 20 Mg Tabs (Lisinopril) .Marland Kitchen.. 1  by mouth once daily 3)  Alprazolam 0.25 Mg Tabs (Alprazolam) .Marland Kitchen.. 1-2  by mouth three times a day as needed 4)  Voltaren 75 Mg Tbec (Diclofenac sodium) 5)  Nasonex 50 Mcg/act Susp (Mometasone furoate) .... 2 sprays in each nostril daily 6)  Nexium 40 Mg Cpdr (Esomeprazole magnesium) .... Take 1 tablet by mouth once a day as needed 7)  Vitamin D 1000 Unit Tabs (Cholecalciferol) .... Take 1 tablet by mouth once a day 8)  Tums 500 Mg Chew (Calcium carbonate antacid) .... Take by mouth as needed 9)  Zoloft 25 Mg Tabs (Sertraline hcl) 10)  Vicodin 5-500 Mg Tabs (Hydrocodone-acetaminophen) 11)  Metformin Hcl 500 Mg Xr24h-tab (Metformin hcl) .... 2 by mouth once daily 12)  Nitrofurantoin Monohyd Macro 100 Mg Caps (Nitrofurantoin monohyd macro) .Marland Kitchen.. 1 by mouth two times a day [id number]  -[phone]   cell  Patient Instructions: 1)  agree this is a uti  2)  take antibiotic   call if not getting better in a few days.  3)  You will be informed of lab results when available.  Prescriptions: NITROFURANTOIN  MONOHYD MACRO 100 MG CAPS (NITROFURANTOIN MONOHYD MACRO) 1 by mouth two times a day  #14 x 0   Entered and Authorized by:   Madelin Headings MD   Signed by:   Madelin Headings MD on 04/26/2010   Method used:   Electronically to        [facility]. [phone]* (retail)       [phone] [address].       [place], [place]  [address]       Ph: [phone]       Fax: [phone]   RxID:   [id number]   Laboratory Results   Urine Tests    Routine Urinalysis   Color: yellow Appearance: Clear Glucose: negative   (Normal Range: Negative) Bilirubin: negative   (Normal Range: Negative) Ketone: negative   (Normal Range: Negative) Spec. Gravity: <1.005   (Normal Range: 1.003-1.035) Blood: 2+   (Normal Range: Negative) pH: 5.0   (Normal Range: 5.0-8.0) Protein:  negative   (Normal Range: Negative) Urobilinogen: 0.2   (Normal Range: 0-1) Nitrite: negative   (Normal Range: Negative) Leukocyte Esterace: 2+   (Normal Range: Negative)    Comments: Rita Ohara  April 26, 2010 10:21 AM

## 2010-11-14 NOTE — Assessment & Plan Note (Signed)
Summary: ?bladder inf/njr   Vital Signs:  Patient profile:   65 year old female Menstrual status:  postmenopausal Weight:      185 pounds Temp:     99.2 degrees F oral Pulse rate:   66 / minute BP sitting:   100 / 64  (right arm) Cuff size:   regular  Vitals Entered By: Romualdo Bolk, CMA (AAMA) (August 20, 2010 11:04 AM) CC: Lower Back pain, burning upon urination and urinary frequency that started on 11/4. CBG Result 94   History of Present Illness: Chelsey [name] comes in today  for sda for above. LBP and then  dysuria . nofever.    Hx of 2 this year .  last  years ago.  BG not checking recenlty   but did have reeses cups and wine last pm.   sausage buiscuit this am. TO have surgery  reconstructive   Nov 30th [facility].    Now on arimidex    Preventive Screening-Counseling & Management  Alcohol-Tobacco     Alcohol drinks/day: <1     Smoking Status: never  Caffeine-Diet-Exercise     Caffeine use/day: 2     Does Patient Exercise: no  Current Medications (verified): 1)  Crestor 10 Mg Tabs (Rosuvastatin Calcium) .Marland Kitchen.. 1 By Mouth Once Daily 2)  Zestril 20 Mg Tabs (Lisinopril) .Marland Kitchen.. 1  By Mouth Once Daily 3)  Alprazolam 0.25 Mg  Tabs (Alprazolam) .Marland Kitchen.. 1-2  By Mouth Three Times A Day As Needed 4)  Voltaren 75 Mg  Tbec (Diclofenac Sodium) 5)  Nasonex 50 Mcg/act Susp (Mometasone Furoate) .... 2 Sprays in Each Nostril Daily 6)  Nexium 40 Mg Cpdr (Esomeprazole Magnesium) .... Take 1 Tablet By Mouth Once A Day As Needed 7)  Vitamin D 1000 Unit Tabs (Cholecalciferol) .... Take 1 Tablet By Mouth Once A Day 8)  Tums 500 Mg Chew (Calcium Carbonate Antacid) .... Take By Mouth As Needed 9)  Zoloft 25 Mg Tabs (Sertraline Hcl) .Marland Kitchen.. 1 By Mouth Once Daily 10)  Vicodin 5-500 Mg Tabs (Hydrocodone-Acetaminophen) 11)  Metformin Hcl 500 Mg Xr24h-Tab (Metformin Hcl) .... 2 By Mouth Once Daily 12)  Arimidex 1 Mg Tabs (Anastrozole) .Marland Kitchen.. 1 By Mouth Once Daily  Allergies (verified): 1)  !  Penicillin 2)  Amoxicillin (Amoxicillin)  Past History:  Past medical, surgical, family and social histories (including risk factors) reviewed, and no changes noted (except as noted below).  Past Medical History: Reviewed history from 06/26/2010 and no changes required. hyperglycemia diabetic range  at one point  Hx colitis ? secondary to NSAIDS  MALIGNANT NEOPLASM OF BREAST UNSPECIFIED SITE (ICD-174.9) -Left- bx +, chemo radiation SNORING (ICD-786.09) RHINITIS, CHRONIC (ICD-472.0) UNS ADVRS EFF UNS RX MEDICINAL&BIOLOGICAL SBSTNC (ICD-995.20) OTHER ACUTE REACTIONS TO STRESS (ICD-308.3) LEG PAIN, BILATERAL (ICD-729.5) IMPAIRED FASTING GLUCOSE (ICD-790.21) OSTEOARTHRITIS (ICD-715.90)------ Knees HYPERTENSION (ICD-401.9) HYPERLIPIDEMIA (ICD-272.4) GERD (ICD-530.81) ALLERGIC RHINITIS (ICD-477.9) Obstructive sleep apnea - NPSG 05/06/19- AHI 47.7/hr, CPAP 14 cwp/ AHI 0/hr  Past Surgical History: Reviewed history from 08/31/2009 and no changes required. Childbirth x3 Caesarean section Breast biopsies  Left Mastectomy on 11/23  Past History:  Care Management: ENT: Dr. Jearld Fenton   Gynecology: Tenny Craw Dermatology: Mayford Knife Gastroenterology: Juanda Chance Hematology/Oncology: [facility] : Dr Jonna Munro  for breast reconst  Family History: Reviewed history from 05/01/2009 and no changes required. dm ht  mom has alzheimers  son died around 62 years secondary TGA pulmonary HT  Daughter with anxiety disorder Mom with  OSA   Social History:  Reviewed history from 06/26/2010 and no changes required. Occupation: Veterinary surgeon working  Married [employer] Sm[employer] son getting divorced and daughter in college mom with alzheimers in the home    Review of Systems  The patient denies anorexia, fever, abdominal pain, melena, hematochezia, hematuria, incontinence, and genital sores.    Physical Exam  General:  Well-developed,well-nourished,in no acute distress; alert,appropriate and  cooperative throughout examination Head:  normocephalic and atraumatic.   Abdomen:  no flank pain  no guarding and no rigidity.   Pulses:  pulses intact without delay   Neurologic:  nl gait. Skin:  no ecchymoses and no petechiae.   Cervical Nodes:  No lymphadenopathy noted Psych:  Oriented X3, normally interactive, good eye contact, not anxious appearing, and not depressed appearing.     Impression & Recommendations:  Problem # 1:  UTI (ICD-599.0) seems  typical     Expectant management and culture done  The following medications were removed from the medication list:    Azithromycin 250 Mg Tabs (Azithromycin) .Marland Kitchen... Take 2 by mouth first day  then 1 by mouth once daily for 4 more days Her updated medication list for this problem includes:    Nitrofurantoin Monohyd Macro 100 Mg Caps (Nitrofurantoin monohyd macro) .Marland Kitchen... 1 by mouth two times a day  Orders: UA Dipstick w/o Micro (automated)  (81003) T-Culture, Urine ([id number])  Problem # 2:  IMPAIRED FASTING GLUCOSE (ICD-790.21) diabetic range in past  a1c 7 in 2009   but doing better  glucose in urine   porb form diet  indiscretion and UTI    good today.  Monitor as before  .  Her updated medication list for this problem includes:    Metformin Hcl 500 Mg Xr24h-tab (Metformin hcl) .Marland Kitchen... 2 by mouth once daily  Orders: Fingerstick (36416) Glucose, (CBG) (69629)  Complete Medication List: 1)  Crestor 10 Mg Tabs (Rosuvastatin calcium) .Marland Kitchen.. 1 by mouth once daily 2)  Zestril 20 Mg Tabs (Lisinopril) .Marland Kitchen.. 1  by mouth once daily 3)  Alprazolam 0.25 Mg Tabs (Alprazolam) .Marland Kitchen.. 1-2  by mouth three times a day as needed 4)  Voltaren 75 Mg Tbec (Diclofenac sodium) 5)  Nasonex 50 Mcg/act Susp (Mometasone furoate) .... 2 sprays in each nostril daily 6)  Nexium 40 Mg Cpdr (Esomeprazole magnesium) .... Take 1 tablet by mouth once a day as needed 7)  Vitamin D 1000 Unit Tabs (Cholecalciferol) .... Take 1 tablet by mouth once a day 8)  Tums 500 Mg  Chew (Calcium carbonate antacid) .... Take by mouth as needed 9)  Zoloft 25 Mg Tabs (Sertraline hcl) .Marland Kitchen.. 1 by mouth once daily 10)  Vicodin 5-500 Mg Tabs (Hydrocodone-acetaminophen) 11)  Metformin Hcl 500 Mg Xr24h-tab (Metformin hcl) .... 2 by mouth once daily 12)  Arimidex 1 Mg Tabs (Anastrozole) .Marland Kitchen.. 1 by mouth once daily 13)  Nitrofurantoin Monohyd Macro 100 Mg Caps (Nitrofurantoin monohyd macro) .Marland Kitchen.. 1 by mouth two times a day  Patient Instructions: 1)  take antibioitc as directed and will   call about  culture results .  2)  Monitor your blood sugar ocassional. Prescriptions: NITROFURANTOIN MONOHYD MACRO 100 MG CAPS (NITROFURANTOIN MONOHYD MACRO) 1 by mouth two times a day  #14 x 0   Entered and Authorized by:   Madelin Headings MD   Signed by:   Madelin Headings MD on 08/20/2010   Method used:   Electronically to        [facility]. [phone]* (retail)  [address]       [place], [place]  [address]       Ph: [phone]       Fax: [phone]   RxID:   [id number]    Orders Added: 1)  UA Dipstick w/o Micro (automated)  [81003] 2)  T-Culture, Urine [[id number]] 3)  Fingerstick [36416] 4)  Glucose, (CBG) [82962] 5)  Est. Patient Level III [27253]    Laboratory Results   Urine Tests  Date/Time Received: August 20, 2010 11:17 AM  Routine Urinalysis   Color: yellow Appearance: Clear Glucose: 100   (Normal Range: Negative) Bilirubin: negative   (Normal Range: Negative) Ketone: negative   (Normal Range: Negative) Spec. Gravity: 1.015   (Normal Range: 1.003-1.035) Blood: small   (Normal Range: Negative) pH: 7.0   (Normal Range: 5.0-8.0) Protein: negative   (Normal Range: Negative) Urobilinogen: 0.2   (Normal Range: 0-1) Nitrite: negative   (Normal Range: Negative) Leukocyte Esterace: large   (Normal Range: Negative)    Comments: Romualdo Bolk, CMA (AAMA)  August 20, 2010 11:18 AM   Blood Tests     CBG  Random:: 94mg /dL

## 2010-11-14 NOTE — Letter (Signed)
[facility]   Im[facility] By: Maryln Gottron 03/08/2010 15:34:36  _____________________________________________________________________  External Attachment:    Type:   Image     Comment:   External Document

## 2010-11-14 NOTE — Assessment & Plan Note (Signed)
Summary: tetanus shot/per shannon/cjr  Nurse Visit   Allergies: 1)  ! Penicillin 2)  Amoxicillin (Amoxicillin)  Immunizations Administered:  Tetanus Vaccine:    Vaccine Type: Tdap    Site: left deltoid    Mfr: GlaxoSmithKline    Dose: 0.5 ml    Route: IM    Given by: Romualdo Bolk, CMA (AAMA)    Exp. Date: 08/01/2012    Lot #: [id number]    VIS given: 08/30/08 version given October 03, 2010.  Orders Added: 1)  Tdap => 68yrs IM [90715] 2)  Admin 1st Vaccine [30865]

## 2010-11-14 NOTE — Progress Notes (Signed)
Summary: Dog bite  Phone Note Call from Patient Call back at Home Phone [phone]   Caller: Patient Summary of Call: Pt states that she was bite by a dog today. She is currently taking an antibiotic bactrium ds 1 po bid and has been on it for 7 days. Dog is up to date on shots. Pt states that it doesn't need stitches. Wound is more superficial than anything. Told pt to clean up wound, put antibiotic ointment on it and cover it up. Initial call taken by: Romualdo Bolk, CMA Duncan Dull),  September 30, 2010 4:26 PM  Follow-up for Phone Call        ? is she up to day on her  tdap?    Where is the bite? call us if we need to check the wound .   any signs of infection. Was this unprovoked attack  ? need to report the bite. ?  Follow-up by: Madelin Headings MD,  September 30, 2010 6:46 PM  Additional Follow-up for Phone Call Additional follow up Details #1::        she does not know about her Tdap.  The dog is hers.  He is up to date and she does not want to report the bite.  The reason she called late yesterday is to find out if she could see Dr. Fabian Sharp before closing.  Did not want to go to the ER or to report this as it was her own dog,. Call came in at 4:30 with several other semi emergent calls, and Carollee Herter was paged to ask Dr. Fabian Sharp about what to do about pt's dog bite. Additional Follow-up by: Lynann Beaver CMA AAMA,  October 01, 2010 2:01 PM    Additional Follow-up for Phone Call Additional follow up Details #2::    LMTOCB Romualdo Bolk, CMA Duncan Dull)  October 01, 2010 4:30 PM   Additional Follow-up for Phone Call Additional follow up Details #3:: Details for Additional Follow-up Action Taken: Pt is returning Shannon's call again.  Sent to [employer] phone. Additional Follow-up by: Lynann Beaver CMA AAMA,  October 02, 2010 2:33 PM    Pt states that her last td was more than 5 years. I told her that she needs Tdap. Wound is looking better. Pt is going to call [facility] because  she is seeing them about getting this. She is going over there on friday. But she is going to call them today and if she can't get it done at [facility], she will come here this week. Romualdo Bolk, CMA Duncan Dull)  October 02, 2010 2:56 PM

## 2010-11-14 NOTE — Progress Notes (Signed)
Summary: lab order  Phone Note Call from Patient Call back at Home Phone [phone]   Caller: Patient Summary of Call: According to ov note from 06/2010 you wanted pt to come in for labs and then a rov. What labs would you like to have done? Initial call taken by: Romualdo Bolk, CMA (AAMA),  November 04, 2010 11:42 AM  Follow-up for Phone Call        cbc bmp lfts tsh and hg a1c  dx ht elevated sugars    breast cancer OA Follow-up by: Madelin Headings MD,  November 04, 2010 2:00 PM  Additional Follow-up for Phone Call Additional follow up Details #1::        Left message for pt to call back. Additional Follow-up by: Romualdo Bolk, CMA Duncan Dull),  November 04, 2010 3:43 PM    Additional Follow-up for Phone Call Additional follow up Details #2::    Pt called back and has been sch for labs on 11/14/10 and a fup with Dr Fabian Sharp on 11/18/10 2:30 Follow-up by: Lucy Antigua,  November 06, 2010 4:17 PM

## 2010-11-14 NOTE — Letter (Signed)
Summary: [facility]  [facility]   Imported By: Maryln Gottron 10/29/2009 10:06:34  _____________________________________________________________________  External Attachment:    Type:   Image     Comment:   External Document

## 2010-11-14 NOTE — Letter (Signed)
[facility]   Imported By: Maryln Gottron 03/15/2010 13:57:44  _____________________________________________________________________  External Attachment:    Type:   Image     Comment:   External Document

## 2010-11-14 NOTE — Letter (Signed)
Summary: [facility]  [facility]   Imported By: Maryln Gottron 06/24/2010 15:51:50  _____________________________________________________________________  External Attachment:    Type:   Image     Comment:   External Document

## 2010-11-14 NOTE — Letter (Signed)
Summary: [facility]  [facility]   Imported By: Maryln Gottron 07/24/2010 10:02:02  _____________________________________________________________________  External Attachment:    Type:   Image     Comment:   External Document

## 2010-11-15 ENCOUNTER — Encounter: Payer: Self-pay | Admitting: Internal Medicine

## 2010-11-18 ENCOUNTER — Ambulatory Visit: Payer: BC Managed Care – PPO | Admitting: Internal Medicine

## 2010-11-18 DIAGNOSIS — Z0289 Encounter for other administrative examinations: Secondary | ICD-10-CM

## 2010-11-26 ENCOUNTER — Ambulatory Visit (INDEPENDENT_AMBULATORY_CARE_PROVIDER_SITE_OTHER): Payer: BC Managed Care – PPO | Admitting: Internal Medicine

## 2010-11-26 ENCOUNTER — Encounter: Payer: Self-pay | Admitting: Internal Medicine

## 2010-11-26 DIAGNOSIS — I10 Essential (primary) hypertension: Secondary | ICD-10-CM

## 2010-11-26 DIAGNOSIS — E785 Hyperlipidemia, unspecified: Secondary | ICD-10-CM

## 2010-11-26 DIAGNOSIS — F4389 Other reactions to severe stress: Secondary | ICD-10-CM

## 2010-11-26 DIAGNOSIS — J309 Allergic rhinitis, unspecified: Secondary | ICD-10-CM

## 2010-11-26 DIAGNOSIS — F438 Other reactions to severe stress: Secondary | ICD-10-CM

## 2010-11-26 DIAGNOSIS — R7301 Impaired fasting glucose: Secondary | ICD-10-CM

## 2010-11-26 DIAGNOSIS — C50919 Malignant neoplasm of unspecified site of unspecified female breast: Secondary | ICD-10-CM

## 2010-11-26 DIAGNOSIS — G4733 Obstructive sleep apnea (adult) (pediatric): Secondary | ICD-10-CM

## 2010-11-26 MED ORDER — ALPRAZOLAM 0.25 MG PO TABS: 0.2500 mg | ORAL_TABLET | Freq: Three times a day (TID) | ORAL | Status: DC | PRN

## 2010-11-26 MED ORDER — LISINOPRIL 20 MG PO TABS: 20.0000 mg | ORAL_TABLET | Freq: Every day | ORAL | Status: DC

## 2010-11-26 MED ORDER — FLUTICASONE PROPIONATE 50 MCG/ACT NA SUSP: 2.0000 | Freq: Every day | NASAL | Status: DC

## 2010-11-26 MED ORDER — ATORVASTATIN CALCIUM 20 MG PO TABS: 20.0000 mg | ORAL_TABLET | Freq: Every day | ORAL | Status: DC

## 2010-11-26 NOTE — Patient Instructions (Signed)
Continue meds as you are taking . Your labs are good today. Try the lipitor  Call if getting leg pains. Call before your may labs so we can order lipids  lfts because we are changing  Your medications.

## 2010-11-27 ENCOUNTER — Encounter: Payer: Self-pay | Admitting: Internal Medicine

## 2010-11-27 NOTE — Assessment & Plan Note (Signed)
Improved   But refill  Prn xanax

## 2010-11-27 NOTE — Assessment & Plan Note (Signed)
Ok to change crestor back to lipitor  For cost reasons .  ? If had leg craps with atorva but  Ok to  Give a trail.

## 2010-11-27 NOTE — Assessment & Plan Note (Signed)
Ok to change to flonase  Instead of nasonex.

## 2010-11-27 NOTE — Assessment & Plan Note (Signed)
Continue lifestyle intervention healthy eating and exercise .  

## 2010-11-27 NOTE — Assessment & Plan Note (Signed)
No current problem with this

## 2010-11-27 NOTE — Assessment & Plan Note (Addendum)
Sp breast reconstruction  residual lymphedema left arm

## 2010-11-27 NOTE — Progress Notes (Signed)
  Subjective:    Patient ID: Chelsey [name], female    DOB: [birth year], 65 y.o.   MRN: [id number]  HPI  Pt comes in today for ,fu of  multiple medical issues . Since last visit has had breast recon surgery x 2  For her breast cancer. Some residual.  swelling let arm and lymphedema but no infection .   DM   Was on insulin in the hospital doing well no lows seems to be controlled. HT:   The side effects of medicine reported. LIPIDS:  No side effects of Crestor but asks about changing to Lipitor again because of the cost difference. She may have had some increased leg cramps with Lipitor but would like to try to get in. ALL RH:  Doing well would like to try a generic fluticasone instead of the Nasonex. Her snoring is stable.   Review of Systems  negative for chest pain shortness of breath vision changes hearing changes falling new joint swelling although arthritis is persistent change in bowel habits bleeding. Otherwise as per history of present illness    Objective:   Physical Exam WDWN in nad  Narres patent op clear teeth in good repair. Neck no masses Chest CTA CV s1 s2 no g or m  No clubbing cyanosis  Left arm with 1-2+ lymphedema no redness or warmth or tenderness Abd Soft nl bs no organomegaly Neuro NO motor changes        Assessment & Plan:  Hyperglycemia HT  LIPIDS ALL rhinitis Breast cancer Left /sp  rx and reconstruction with residual lymphedema left

## 2010-11-27 NOTE — Assessment & Plan Note (Signed)
Controlled  No se of meds  

## 2010-12-29 LAB — BASIC METABOLIC PANEL
Calcium: 9.7 mg/dL (ref 8.4–10.5)
GFR calc Af Amer: >60 mL/min (ref >60)
GFR calc non Af Amer: >60 mL/min (ref >60)
Potassium: 4.1 mEq/L (ref 3.5–5.1)
Sodium: 138 mEq/L (ref 135–145)

## 2010-12-29 LAB — GLUCOSE, CAPILLARY: Glucose-Capillary: 171 mg/dL — ABNORMAL HIGH (ref 70–99)

## 2010-12-29 LAB — POCT HEMOGLOBIN-HEMACUE: Hemoglobin: 14.2 g/dL (ref 12.0–15.0)

## 2010-12-30 LAB — BASIC METABOLIC PANEL
CO2: 30 mEq/L (ref 19–32)
Glucose, Bld: 120 mg/dL — ABNORMAL HIGH (ref 70–99)
Potassium: 4.1 mEq/L (ref 3.5–5.1)
Sodium: 139 mEq/L (ref 135–145)

## 2010-12-30 LAB — GLUCOSE, CAPILLARY: Glucose-Capillary: 151 mg/dL — ABNORMAL HIGH (ref 70–99)

## 2011-01-08 ENCOUNTER — Telehealth: Payer: Self-pay | Admitting: *Deleted

## 2011-01-08 NOTE — Telephone Encounter (Signed)
Pt wants to change back to crestor because the lipitor is not working.

## 2011-01-08 NOTE — Telephone Encounter (Signed)
Ok to go back to previous med

## 2011-01-09 MED ORDER — ROSUVASTATIN CALCIUM 10 MG PO TABS: 10.0000 mg | ORAL_TABLET | Freq: Every day | ORAL | Status: DC

## 2011-01-09 NOTE — Telephone Encounter (Signed)
Pt had legs cramps on lipitor so she d/c it and leg cramps are better.

## 2011-01-15 LAB — COMPREHENSIVE METABOLIC PANEL WITH GFR
ALT: 16 U/L (ref 0–35)
AST: 23 U/L (ref 0–37)
Albumin: 3.9 g/dL (ref 3.5–5.2)
Alkaline Phosphatase: 66 U/L (ref 39–117)
BUN: 5 mg/dL — ABNORMAL LOW (ref 6–23)
CO2: 30 meq/L (ref 19–32)
Calcium: 9.9 mg/dL (ref 8.4–10.5)
Chloride: 104 meq/L (ref 96–112)
Creatinine, Ser: 0.49 mg/dL (ref 0.4–1.2)
GFR calc non Af Amer: >60 mL/min
Glucose, Bld: 94 mg/dL (ref 70–99)
Potassium: 4.2 meq/L (ref 3.5–5.1)
Sodium: 141 meq/L (ref 135–145)
Total Bilirubin: 0.7 mg/dL (ref 0.3–1.2)
Total Protein: 6.7 g/dL (ref 6.0–8.3)

## 2011-01-15 LAB — GLUCOSE, CAPILLARY
Glucose-Capillary: 103 mg/dL — ABNORMAL HIGH (ref 70–99)
Glucose-Capillary: 124 mg/dL — ABNORMAL HIGH (ref 70–99)
Glucose-Capillary: 142 mg/dL — ABNORMAL HIGH (ref 70–99)
Glucose-Capillary: 155 mg/dL — ABNORMAL HIGH (ref 70–99)

## 2011-01-15 LAB — DIFFERENTIAL
Basophils Absolute: 0 K/uL (ref 0.0–0.1)
Basophils Relative: 1 % (ref 0–1)
Eosinophils Absolute: 0.1 K/uL (ref 0.0–0.7)
Eosinophils Relative: 3 % (ref 0–5)
Lymphocytes Relative: 21 % (ref 12–46)
Lymphs Abs: 1.2 K/uL (ref 0.7–4.0)
Monocytes Absolute: 0.7 K/uL (ref 0.1–1.0)
Monocytes Relative: 11 % (ref 3–12)
Neutro Abs: 3.7 K/uL (ref 1.7–7.7)
Neutrophils Relative %: 65 % (ref 43–77)

## 2011-01-15 LAB — CBC
HCT: 31.4 % — ABNORMAL LOW (ref 36.0–46.0)
Hemoglobin: 11 g/dL — ABNORMAL LOW (ref 12.0–15.0)
MCHC: 35 g/dL (ref 30.0–36.0)
MCV: 92.6 fL (ref 78.0–100.0)
Platelets: 295 K/uL (ref 150–400)
RBC: 3.4 MIL/uL — ABNORMAL LOW (ref 3.87–5.11)
RDW: 19.6 % — ABNORMAL HIGH (ref 11.5–15.5)
WBC: 5.8 K/uL (ref 4.0–10.5)

## 2011-01-17 ENCOUNTER — Other Ambulatory Visit: Payer: Self-pay | Admitting: Internal Medicine

## 2011-01-19 LAB — COMPREHENSIVE METABOLIC PANEL
Albumin: 4.5 g/dL (ref 3.5–5.2)
BUN: 15 mg/dL (ref 6–23)
Chloride: 100 mEq/L (ref 96–112)
Creatinine, Ser: 0.81 mg/dL (ref 0.4–1.2)
GFR calc non Af Amer: >60 mL/min (ref >60)
Glucose, Bld: 142 mg/dL — ABNORMAL HIGH (ref 70–99)
Total Bilirubin: 1.2 mg/dL (ref 0.3–1.2)

## 2011-01-19 LAB — CBC
HCT: 44.3 % (ref 36.0–46.0)
MCV: 92.1 fL (ref 78.0–100.0)
Platelets: 206 10*3/uL (ref 150–400)
WBC: 6.3 10*3/uL (ref 4.0–10.5)

## 2011-01-19 LAB — DIFFERENTIAL
Basophils Absolute: 0 10*3/uL (ref 0.0–0.1)
Lymphocytes Relative: 30 % (ref 12–46)
Neutro Abs: 3.9 10*3/uL (ref 1.7–7.7)
Neutrophils Relative %: 62 % (ref 43–77)

## 2011-01-19 LAB — GLUCOSE, CAPILLARY
Glucose-Capillary: 112 mg/dL — ABNORMAL HIGH (ref 70–99)
Glucose-Capillary: 112 mg/dL — ABNORMAL HIGH (ref 70–99)
Glucose-Capillary: 136 mg/dL — ABNORMAL HIGH (ref 70–99)

## 2011-02-18 ENCOUNTER — Other Ambulatory Visit: Payer: Self-pay | Admitting: Oncology

## 2011-02-18 ENCOUNTER — Encounter (HOSPITAL_BASED_OUTPATIENT_CLINIC_OR_DEPARTMENT_OTHER): Payer: BC Managed Care – PPO | Admitting: Oncology

## 2011-02-18 DIAGNOSIS — C50919 Malignant neoplasm of unspecified site of unspecified female breast: Secondary | ICD-10-CM

## 2011-02-18 DIAGNOSIS — C50419 Malignant neoplasm of upper-outer quadrant of unspecified female breast: Secondary | ICD-10-CM

## 2011-02-18 LAB — CBC WITH DIFFERENTIAL/PLATELET
BASO%: 0.4 % (ref 0.0–2.0)
EOS%: 2.5 % (ref 0.0–7.0)
MCH: 31.4 pg (ref 25.1–34.0)
MCHC: 34.9 g/dL (ref 31.5–36.0)
MONO#: 0.4 10*3/uL (ref 0.1–0.9)
RBC: 4.24 10*6/uL (ref 3.70–5.45)
WBC: 5.7 10*3/uL (ref 3.9–10.3)
lymph#: 1.4 10*3/uL (ref 0.9–3.3)

## 2011-02-19 LAB — COMPREHENSIVE METABOLIC PANEL
ALT: 21 U/L (ref 0–35)
AST: 21 U/L (ref 0–37)
CO2: 24 mEq/L (ref 19–32)
Calcium: 10.9 mg/dL — ABNORMAL HIGH (ref 8.4–10.5)
Chloride: 103 mEq/L (ref 96–112)
Sodium: 142 mEq/L (ref 135–145)
Total Bilirubin: 0.6 mg/dL (ref 0.3–1.2)
Total Protein: 7.1 g/dL (ref 6.0–8.3)

## 2011-02-19 LAB — LACTATE DEHYDROGENASE: LDH: 128 U/L (ref 94–250)

## 2011-02-19 LAB — VITAMIN D 25 HYDROXY (VIT D DEFICIENCY, FRACTURES): Vit D, 25-Hydroxy: 44 ng/mL (ref 30–89)

## 2011-02-25 ENCOUNTER — Other Ambulatory Visit: Payer: Self-pay | Admitting: Oncology

## 2011-02-25 ENCOUNTER — Encounter (HOSPITAL_BASED_OUTPATIENT_CLINIC_OR_DEPARTMENT_OTHER): Payer: BC Managed Care – PPO | Admitting: Oncology

## 2011-02-25 DIAGNOSIS — C50419 Malignant neoplasm of upper-outer quadrant of unspecified female breast: Secondary | ICD-10-CM

## 2011-02-25 DIAGNOSIS — Z9012 Acquired absence of left breast and nipple: Secondary | ICD-10-CM

## 2011-02-25 NOTE — Procedures (Signed)
NAME:  Chelsey [name]                     ACCOUNT NO.:  [id number]   MEDICAL RECORD NO.:  [id number]          PATIENT TYPE:  OUT   LOCATION:  SLEEP CENTER                 FACILITY:  [facility]   PHYSICIAN:  Clinton D. Maple Hudson, MD, FCCP, FACPDATE OF BIRTH:  1946/12/17   DATE OF STUDY:  05/05/2009                            NOCTURNAL POLYSOMNOGRAM   REFERRING PHYSICIAN:  Clinton D. Young, MD, FCCP, FACP   INDICATION FOR STUDY:  Hypersomnia with sleep apnea.   EPWORTH SLEEPINESS SCORE:  Epworth sleepiness score 4/24, BMI 34.  Weight 192 pounds, height 63 inches.  Neck 16 inches.   MEDICATIONS:  Home medications are charted and reviewed.   SLEEP ARCHITECTURE:  Split study protocol.  During the diagnostic phase,  total sleep time 134.5 minutes with sleep efficiency 57.7%.  Stage I was  23.4%, stage II 76.6%, stages III and REM were absent.  Sleep latency  21.5 minutes.  Awake after sleep onset 55 minutes.  Arousal index 11.6.   BEDTIME MEDICATION:  Alprazolam, Benadryl.   RESPIRATORY DATA:  Split study protocol.  Apnea/hypopnea index (AHI)  47.7 per hour.  107 events were scored, all as hypopneas associated with  nonsupine sleep position.  CPAP was then titrated to 14 CWP, AHI 0 per  hour.  She wore a small ResMed full face Quattro mask with heated  humidifier.   OXYGEN DATA:  Moderately loud snoring before CPAP with oxygen  desaturation to a nadir of 86%.  After CPAP control, mean oxygen  saturation held at 96.2% on room air.   CARDIAC DATA:  Normal sinus rhythm.   MOVEMENT-PARASOMNIA:  No movement disturbance.  Bathroom x1.   IMPRESSIONS-RECOMMENDATIONS:  1. The patient was uncomfortable related to left sciatic nerve      discomfort and also her Port-A-Cath.  She had nasal congestion      despite Benadryl at bedtime.  2. Moderately severe obstructive sleep apnea/hypopnea syndrome, AHI      47.7 per hour with all events recorded as nonsupine hypopneas.      Moderately loud snoring with  oxygen desaturation to a nadir of 86%      on room air.  3. Successful CPAP titration to 14 CWP, AHI 0 per hour.  She wore a      small ResMed full face Quattro mask with      heated humidifier.  She will probably need attention to rhinitis      and nasal congestion for a successful CPAP use.      Clinton D. Maple Hudson, MD, [facility], FACP  Diplomate, Biomedical engineer of Sleep Medicine  Electronically Signed     CDY/MEDQ  D:  05/12/2009 09:47:02  T:  05/12/2009 12:31:57  Job:  [id number]

## 2011-02-25 NOTE — Op Note (Signed)
NAME:  Chelsey [name], Chelsey [name]                     ACCOUNT NO.:  [id number]   MEDICAL RECORD NO.:  [id number]          PATIENT TYPE:  AMB   LOCATION:  DSC                          FACILITY:  [facility]   PHYSICIAN:  Thomas A. Cornett, M.D.DATE OF BIRTH:  [birth year]   DATE OF PROCEDURE:  04/23/2009  DATE OF DISCHARGE:                               OPERATIVE REPORT   PREOPERATIVE DIAGNOSIS:  Left breast cancer.   POSTOPERATIVE DIAGNOSIS:  Left breast cancer plus in need of venous  access for preoperative chemotherapy.   PROCEDURE:  Placement of right subclavian 8-French Power Port-A-Cath  with fluoroscopy.   SURGEON:  Maisie Fus A. Cornett, MD   ANESTHESIA:  MAC with 0.25% Sensorcaine local with epinephrine.   ESTIMATED BLOOD LOSS:  10 mL.   SPECIMENS:  None.   INDICATIONS FOR PROCEDURE:  The patient is a 65 year old female with a  large left breast cancer in need of preoperative neoadjuvant  chemotherapy.  She presents today for insertion of Port-A-Cath for  preoperative chemotherapy.  Procedure was discussed at the bedside with  her to include complications of bleeding, infection, pneumothorax,  hemothorax, injury to other intramediastinal structures to include  esophagus, arteries, veins, nerves, and airway, also complications of  catheter malfunction, catheter migration, catheter dislodgement, raw  potential complications discussed.  She voiced understanding and the  need for Port-A-Cath in the benefit of easier administration of  chemotherapy with it.   DESCRIPTION OF PROCEDURE:  The patient was brought to the operating room  and placed supine.  After induction of anesthesia, both arms were tucked  and the upper chest and neck regions were prepped and draped in sterile  fashion.  With the patient in Trendelenburg, the right subclavian area  was injected with 0.25% Sensorcaine.  A needle was used to cannulate the  right subclavian vein with return of dark non-pulsatile blood.  Wire was  fed  through this easily under fluoroscopic guidance and was found to be  in the superior vena cava.  The patient was flattened out.  A small  incision of about 3 cm was made below the wire insertion site and a  small pocket was created using cautery down to the chest wall.  A small  incision was made at the wire entrance site as well with a 11 blade.  The catheter was then brought on the field.  It was flushed and then put  together.  It was then put through the lower incision to the upper  incision without difficulty and cut at about 17 cm.  The patient was  then placed back in Trendelenburg.  I advanced the dilator over the wire  moving the wire to and fro without resistance.  I then removed the  dilator and placed the dilator introducer complex over the wire moving  it to and fro as I advanced it without difficulty.  I then removed the  wire and dilator.  Catheter was placed in the introducer and the peel-  away sheath was pulled away without difficulty.  We then shot another  fluoroscopic image which  showed the tip to be in superior vena cava with  no obvious pneumothorax.  There were no kinks in the catheter.  We then  drew back on the catheter.  We were able to drew dark non-pulsatile  blood from this and then flushed it with heparinized saline.  It drew  back easily and flushed easily.  We then flushed the catheter with 100  units/mL of heparinized saline.  The catheter was secured to the chest  wall with 2-0 Prolene.  We then closed the incisions with combination of  a deep layer of 3-0  Vicryl and subsequent 4-0 Monocryl stitch.  Dermabond was applied over  both incisions.  All final counts of sponge, needle, and instruments  were found to be correct at this portion of the case.  The patient was  then awoken, taken to the recovery room in satisfactory condition.  Chest x-ray will be obtained.      Thomas A. Cornett, M.D.  Electronically Signed     TAC/MEDQ  D:  04/23/2009  T:   04/24/2009  Job:  [id number]   cc:   Pierce Crane, MD

## 2011-03-06 ENCOUNTER — Other Ambulatory Visit: Payer: Self-pay | Admitting: Internal Medicine

## 2011-03-24 ENCOUNTER — Other Ambulatory Visit: Payer: Self-pay | Admitting: Internal Medicine

## 2011-04-05 ENCOUNTER — Other Ambulatory Visit: Payer: Self-pay | Admitting: Internal Medicine

## 2011-05-01 ENCOUNTER — Ambulatory Visit: Payer: BC Managed Care – PPO

## 2011-05-23 ENCOUNTER — Other Ambulatory Visit: Payer: Self-pay | Admitting: Internal Medicine

## 2011-06-23 ENCOUNTER — Other Ambulatory Visit: Payer: Self-pay | Admitting: Internal Medicine

## 2011-06-23 NOTE — Telephone Encounter (Signed)
Ok x 1

## 2011-06-23 NOTE — Telephone Encounter (Signed)
Last ov 11/26/10 Nov- none

## 2011-06-24 NOTE — Telephone Encounter (Signed)
Rx called in 

## 2011-06-30 ENCOUNTER — Other Ambulatory Visit: Payer: Self-pay | Admitting: Internal Medicine

## 2011-07-07 ENCOUNTER — Encounter: Payer: Self-pay | Admitting: Internal Medicine

## 2011-07-07 ENCOUNTER — Ambulatory Visit (INDEPENDENT_AMBULATORY_CARE_PROVIDER_SITE_OTHER): Payer: BC Managed Care – PPO | Admitting: Internal Medicine

## 2011-07-07 VITALS — BP 130/80 | HR 70 | Temp 99.5°F | Wt 179.0 lb

## 2011-07-07 DIAGNOSIS — R509 Fever, unspecified: Secondary | ICD-10-CM

## 2011-07-07 DIAGNOSIS — I10 Essential (primary) hypertension: Secondary | ICD-10-CM

## 2011-07-07 DIAGNOSIS — J329 Chronic sinusitis, unspecified: Secondary | ICD-10-CM

## 2011-07-07 DIAGNOSIS — R8290 Unspecified abnormal findings in urine: Secondary | ICD-10-CM

## 2011-07-07 DIAGNOSIS — R82998 Other abnormal findings in urine: Secondary | ICD-10-CM

## 2011-07-07 DIAGNOSIS — C50919 Malignant neoplasm of unspecified site of unspecified female breast: Secondary | ICD-10-CM

## 2011-07-07 LAB — POCT URINALYSIS DIPSTICK
Bilirubin, UA: NEGATIVE
Glucose, UA: NEGATIVE
Ketones, UA: NEGATIVE
Leukocytes, UA: NEGATIVE
Nitrite, UA: NEGATIVE
Protein, UA: NEGATIVE
Spec Grav, UA: 1.02
Urobilinogen, UA: 0.2
pH, UA: 5

## 2011-07-07 MED ORDER — LISINOPRIL 20 MG PO TABS: 20.0000 mg | ORAL_TABLET | Freq: Every day | ORAL | Status: DC

## 2011-07-07 MED ORDER — CEFUROXIME AXETIL 500 MG PO TABS: 500.0000 mg | ORAL_TABLET | Freq: Two times a day (BID) | ORAL | Status: AC

## 2011-07-07 NOTE — Patient Instructions (Addendum)
Antibiotic for possible sinus infection. Get chest x ray tomorrow first thing   And another urinalysis  Will notify you of results. Fever should be gone in 48 -72 hours either way.  Call if fever not gone  By then

## 2011-07-07 NOTE — Progress Notes (Signed)
  Subjective:    Patient ID: Chelsey [name], female    DOB: Jun 16, 1946, 65 y.o.   MRN: [id number]  HPI Patient comes in today for an acute visit evaluation. She was in her usual state of health until about 6 days ago when she noted the onset of nocturnal fever chills and increased sinus pain and aching. She has had some continued low-grade temperatures at night since that time. Last night it was 100.2 that's better in the morning. She has a minor cough mid chest and a lot of sinus drainage like she has had in the past. Otherwise no unusual rashes new joint pains urinary tract infection symptoms or abdominal pain.  Recent travel to [place] for a conference no tick bites new rashes  uti sx . She has had major reconstructive surgery status post her mastectomy.   History of infections and graft difficulties however it is has been stable and there is no redness or pain in that area. She does have mild lymphedema on the left arm.  She believes her last antibiotic was made 2012   Review of Systems As per hpi  No bleeding.  Some fatigue  Some blood from right nostril at times Rest neg or hpi Past history family history social history reviewed in the electronic medical record.      Objective:   Physical Exam Physical Exam: Vital signs reviewed WUJ:WJXB is a well-developed well-nourished alert cooperative  white female who appears her stated age in no acute distress.  Looks tired  HEENT: normocephalic  traumatic , Eyes: PERRL EOM's full, conjunctiva clear, Nares: paten,t no deformity discharge mild right maxillary  tenderness., Ears: no deformity EAC's clear TMs with normal landmarks. Mouth: clear OP, no lesions, edema.  Moist mucous membranes. Dentition in adequate repair. NECK: supple without masses, thyromegaly or bruits. CHEST/PULM:  Clear to auscultation and percussion breath sounds equal no wheeze , rales or rhonchi. Well healed scars from reconstrutive surgeries some warmth left but no tenderness or  redness or fluctuance  axill a is clear  Arm no redness slight edema CV: PMI is nondisplaced, S1 S2 no gallops, murmurs, rubs. Peripheral pulses are full without delay.No JVD .  ABDOMEN: Bowel sounds normal nontender  No guard or rebound, no hepato splenomegal no CVA tenderness. Extremtities:  No clubbing cyanosis or edema, no acute joint swelling or redness no focal atrophy has OA NEURO:  Oriented x3, cranial nerves 3-12 appear to be intact, no obvious focal weakness,gait within normal limits  SKIN: No acute rashes normal turgor, color, no bruising or petechiae. see above  PSYCH: Oriented, good eye contact, no obvious depression anxiety, cognition and judgment appear normal.      Assessment & Plan:  Fever :  Nocturnal low grade fevers for 6 days with some ur sx  Otherwise non focal exam.  Consider sinusitis    Get chest x ray  UA 3+ blood no wbc  Empiric rx   Gets rash with pcn  warn of slight changes of cross reactino End of day got get x ray tomorrow and repeat UA with micro  Call in 48 hours of fever persists for further advice .    HT ok to refil med today  Hx of breast CA  Hx of graft area infection but appears ok for now.

## 2011-07-08 ENCOUNTER — Ambulatory Visit (INDEPENDENT_AMBULATORY_CARE_PROVIDER_SITE_OTHER)
Admission: RE | Admit: 2011-07-08 | Discharge: 2011-07-08 | Disposition: A | Discharge status: Home / Self Care | Payer: BC Managed Care – PPO | Source: Ambulatory Visit | Attending: Internal Medicine | Admitting: Internal Medicine

## 2011-07-08 ENCOUNTER — Telehealth: Payer: Self-pay | Admitting: *Deleted

## 2011-07-08 ENCOUNTER — Other Ambulatory Visit (INDEPENDENT_AMBULATORY_CARE_PROVIDER_SITE_OTHER): Payer: BC Managed Care – PPO

## 2011-07-08 DIAGNOSIS — R509 Fever, unspecified: Secondary | ICD-10-CM

## 2011-07-08 LAB — URINALYSIS, ROUTINE W REFLEX MICROSCOPIC
Bilirubin Urine: NEGATIVE
Hgb urine dipstick: NEGATIVE
Leukocytes, UA: NEGATIVE
Nitrite: NEGATIVE

## 2011-07-08 MED ORDER — AZITHROMYCIN 250 MG PO TABS: ORAL_TABLET | ORAL | Status: AC

## 2011-07-08 NOTE — Telephone Encounter (Signed)
Pt aware of results 

## 2011-07-08 NOTE — Telephone Encounter (Signed)
Message copied by Romualdo Bolk on Tue Jul 08, 2011  4:19 PM ------      Message from: [facility] [place], [place] K      Created: Tue Jul 08, 2011  4:11 PM       Tell patient chest x ray show poss early pneumonia on left side .      The antibiotic given may kill some pneumonia germs  (is better for sinus infection  but may not be strong enough .)      Call in z pack    And plan follow up  In 2 weeks or as needed. We will need a follow up x ray after better to make sure this is gone.                   Also her urinalysis is normal

## 2011-07-22 ENCOUNTER — Telehealth: Payer: Self-pay | Admitting: Internal Medicine

## 2011-07-22 NOTE — Telephone Encounter (Signed)
Pt coming in for ov on 10/15 and is req to get labs done prior to visit.

## 2011-07-22 NOTE — Telephone Encounter (Signed)
Patient Instructions     Continue meds as you are taking .  Your labs are good today.  Try the lipitor Call if getting leg pains.  Call before your may labs so we can order lipids lfts because we are changing Your medications   Per Dr. Fabian Sharp- lipids, liver, hga1c, bmp

## 2011-07-23 NOTE — Telephone Encounter (Signed)
Pt has been called and sch for labs as noted.

## 2011-07-24 ENCOUNTER — Other Ambulatory Visit (INDEPENDENT_AMBULATORY_CARE_PROVIDER_SITE_OTHER): Payer: BC Managed Care – PPO

## 2011-07-24 DIAGNOSIS — T887XXA Unspecified adverse effect of drug or medicament, initial encounter: Secondary | ICD-10-CM

## 2011-07-24 DIAGNOSIS — E785 Hyperlipidemia, unspecified: Secondary | ICD-10-CM

## 2011-07-24 LAB — LIPID PANEL
HDL: 59 mg/dL (ref >39.00)
Total CHOL/HDL Ratio: 3
Triglycerides: 201 mg/dL — ABNORMAL HIGH (ref 0.0–149.0)

## 2011-07-24 LAB — HEPATIC FUNCTION PANEL
ALT: 20 U/L (ref 0–35)
Total Bilirubin: 0.5 mg/dL (ref 0.3–1.2)

## 2011-07-24 LAB — BASIC METABOLIC PANEL
GFR: 138.27 mL/min (ref >60.00)
Glucose, Bld: 136 mg/dL — ABNORMAL HIGH (ref 70–99)
Potassium: 4.5 mEq/L (ref 3.5–5.1)
Sodium: 141 mEq/L (ref 135–145)

## 2011-07-28 ENCOUNTER — Encounter: Payer: Self-pay | Admitting: Internal Medicine

## 2011-07-28 ENCOUNTER — Ambulatory Visit (INDEPENDENT_AMBULATORY_CARE_PROVIDER_SITE_OTHER): Payer: BC Managed Care – PPO | Admitting: Internal Medicine

## 2011-07-28 DIAGNOSIS — M79601 Pain in right arm: Secondary | ICD-10-CM

## 2011-07-28 DIAGNOSIS — E785 Hyperlipidemia, unspecified: Secondary | ICD-10-CM

## 2011-07-28 DIAGNOSIS — M79609 Pain in unspecified limb: Secondary | ICD-10-CM

## 2011-07-28 DIAGNOSIS — I10 Essential (primary) hypertension: Secondary | ICD-10-CM

## 2011-07-28 DIAGNOSIS — J189 Pneumonia, unspecified organism: Secondary | ICD-10-CM

## 2011-07-28 DIAGNOSIS — J309 Allergic rhinitis, unspecified: Secondary | ICD-10-CM

## 2011-07-28 DIAGNOSIS — M792 Neuralgia and neuritis, unspecified: Secondary | ICD-10-CM

## 2011-07-28 DIAGNOSIS — IMO0002 Reserved for concepts with insufficient information to code with codable children: Secondary | ICD-10-CM

## 2011-07-28 DIAGNOSIS — R7301 Impaired fasting glucose: Secondary | ICD-10-CM

## 2011-07-28 DIAGNOSIS — C50919 Malignant neoplasm of unspecified site of unspecified female breast: Secondary | ICD-10-CM

## 2011-07-28 HISTORY — DX: Pneumonia, unspecified organism: J18.9

## 2011-07-28 MED ORDER — HYDROCODONE-ACETAMINOPHEN 5-325 MG PO TABS: 1.0000 | ORAL_TABLET | ORAL | Status: AC | PRN

## 2011-07-28 MED ORDER — METFORMIN HCL ER 500 MG PO TB24: 1000.0000 mg | ORAL_TABLET | Freq: Every day | ORAL | Status: DC

## 2011-07-28 MED ORDER — PREDNISONE 10 MG PO TABS: ORAL_TABLET | ORAL | Status: AC

## 2011-07-28 MED ORDER — FLUTICASONE PROPIONATE 50 MCG/ACT NA SUSP: 2.0000 | Freq: Every day | NASAL | Status: DC

## 2011-07-28 NOTE — Patient Instructions (Signed)
I am concerned about arm  Pain being a pinched nerve in your neck and poss CTS.  Prednisone   For now to decrease  Swelling around the nerve. And pain as needed. This could  elevated  blood sugar  temporaro so monitor this and call if needed. Also will see specialist . Get  Chest x ray  In the meantime before  Taking the prednisone .  Pain med in the short run Call if you get a rash. Recheck  Otherwise  With labs in 4-6 months .

## 2011-07-28 NOTE — Progress Notes (Signed)
  Subjective:    Patient ID: Chelsey [name], female    DOB: 01/07/1946, 65 y.o.   MRN: [id number]  HPI Patient comes in today for follow up of  multiple medical problems.   See last visit  Has used antibiotic and  Now fever is better dx with left sided pneumonia .still is tired  New problem: Pain and burning in hands for a while  Poss cts but thenRecently moved work site and lifting and bending . Pain is severe and hard to sleep with this  Has tried  Voltaren  ibuprof and xanax. Laying down makes pain worse.  Severe pain and cannot sleep. Hard to cut and grasp .  Right hand  ? If cts .   Hand numbness and tingling in middle finger Lymphedema in left arm. No change   BP stable on meds  BG no change on metformin Breast cancer  On  arimidex     Review of Systems No cp sob now cough some sinus congestion no fever now  No unusual rash   No NVD otherwise as per hpi Past history family history social history reviewed in the electronic medical record.     Objective:   Physical Exam WDWN in mild discomfort  HEENT: Normocephalic ;atraumatic , Eyes;  PERRL, EOMs  Full, lids and conjunctiva clear,,Ears: no deformities, canals nl, TM landmarks normal, Nose: no deformity or discharge mild congestion Mouth : OP clear without lesion or edema . Neck: Supple without adenopathy or masses or bruits no Brinckerhoff mass no midline tenderness Chest:  Clear to A&P without wheezes rales or rhonchi CV:  S1-S2 no gallops or murmurs peripheral perfusion is normal MS: right arm nl rom  Co pain alone lateral arm grip 4.5/5 cause of pain no atrophy . Vascular intact  dtrs present    Lab Results  Component Value Date   WBC 4.1* 11/14/2010   HGB 13.3 02/18/2011   HCT 38.2 02/18/2011   PLT 213 02/18/2011   GLUCOSE 136* 07/24/2011   CHOL 184 07/24/2011   TRIG 201.0* 07/24/2011   HDL 59.00 07/24/2011   LDLDIRECT 101.5 07/24/2011   LDLCALC 78 08/20/2010   ALT 20 07/24/2011   AST 19 07/24/2011   NA 141 07/24/2011   K 4.5 07/24/2011    CL 104 07/24/2011   CREATININE 0.5 07/24/2011   BUN 12 07/24/2011   CO2 28 07/24/2011   TSH 1.83 11/14/2010   HGBA1C 6.4 07/24/2011   MICROALBUR 0.6 02/14/2009   Reviewed with patient    Assessment & Plan:  Pneumonia left ;better   Get fu chest x rya  Pre prednisone rx   Right arm pain severe  Out of proportion ot exam  Could have cts but suspect neuritis poss radicular. No help with conservative rx ..will add pred nad pain control and specialty consultation Hyperglycemia controlled continue with meds Hypertension  Controlled Lipids:  On meds as tolerated   ldl 78

## 2011-07-29 ENCOUNTER — Ambulatory Visit (INDEPENDENT_AMBULATORY_CARE_PROVIDER_SITE_OTHER)
Admission: RE | Admit: 2011-07-29 | Discharge: 2011-07-29 | Disposition: A | Discharge status: Home / Self Care | Payer: BC Managed Care – PPO | Source: Ambulatory Visit | Attending: Internal Medicine | Admitting: Internal Medicine

## 2011-07-29 DIAGNOSIS — J189 Pneumonia, unspecified organism: Secondary | ICD-10-CM

## 2011-07-29 DIAGNOSIS — M79609 Pain in unspecified limb: Secondary | ICD-10-CM

## 2011-07-29 DIAGNOSIS — M792 Neuralgia and neuritis, unspecified: Secondary | ICD-10-CM

## 2011-07-29 DIAGNOSIS — IMO0002 Reserved for concepts with insufficient information to code with codable children: Secondary | ICD-10-CM

## 2011-07-29 DIAGNOSIS — M79601 Pain in right arm: Secondary | ICD-10-CM

## 2011-07-29 DIAGNOSIS — R7301 Impaired fasting glucose: Secondary | ICD-10-CM

## 2011-07-29 NOTE — Progress Notes (Signed)
Left message on machine about results. 

## 2011-07-30 ENCOUNTER — Encounter: Payer: Self-pay | Admitting: Internal Medicine

## 2011-07-30 ENCOUNTER — Telehealth: Payer: Self-pay | Admitting: Internal Medicine

## 2011-07-30 DIAGNOSIS — G4733 Obstructive sleep apnea (adult) (pediatric): Secondary | ICD-10-CM

## 2011-07-30 NOTE — Telephone Encounter (Signed)
Was speaking with patient but got disconnected. I had to call back and leave a message for patient to return my call in the morning to schedule 6 week follow up OV with CY. Order placed.

## 2011-07-30 NOTE — Telephone Encounter (Signed)
Spoke with patient-states she was told by CY at last OV to let him know when she is ready to start her CPAP-pt is ready and needs CPAP settings order,etc sent to DME company. Pt will own her own machine-will use DME for supplies and such. Pt is also aware that she will still need to follow up with CY at least once a year for insurance to continue coverage for supplies and remain a patient of CY. Will send to [facility] for orders.

## 2011-07-30 NOTE — Assessment & Plan Note (Addendum)
ldl 100 range  Continue could be better  follow

## 2011-07-30 NOTE — Telephone Encounter (Signed)
OK to order -[facility]- new CPAP 14, mask of choice, humidifier,  Dx OSA NPSG is in EMR- done 05/06/19  AHI 47/hr  She will need office face to face visit in 6 weeks please

## 2011-07-30 NOTE — Assessment & Plan Note (Signed)
Onset right arm hand  Sounds like neuritis   And poss CTS no rash but pain severe enough for zoster.  Will plan pred with precautions and referral. And pain meds

## 2011-07-31 NOTE — Telephone Encounter (Signed)
Spoke with pt. I advised that the order for CPAP was sent and she will need to followup here in 6 wks. She states unable to sched this appt today, and will call us back once gets started on CPAP to sched 6 wk rov.

## 2011-08-01 ENCOUNTER — Other Ambulatory Visit: Payer: Self-pay | Admitting: Internal Medicine

## 2011-08-11 ENCOUNTER — Ambulatory Visit: Payer: BC Managed Care – PPO | Attending: Oncology | Admitting: Physical Therapy

## 2011-08-11 DIAGNOSIS — I890 Lymphedema, not elsewhere classified: Secondary | ICD-10-CM | POA: Insufficient documentation

## 2011-08-11 DIAGNOSIS — IMO0001 Reserved for inherently not codable concepts without codable children: Secondary | ICD-10-CM | POA: Insufficient documentation

## 2011-08-12 ENCOUNTER — Other Ambulatory Visit: Payer: Self-pay | Admitting: Oncology

## 2011-08-12 DIAGNOSIS — C50919 Malignant neoplasm of unspecified site of unspecified female breast: Secondary | ICD-10-CM

## 2011-08-13 ENCOUNTER — Ambulatory Visit
Admission: RE | Admit: 2011-08-13 | Discharge: 2011-08-13 | Disposition: A | Discharge status: Home / Self Care | Payer: BC Managed Care – PPO | Source: Ambulatory Visit | Attending: Oncology | Admitting: Oncology

## 2011-08-13 ENCOUNTER — Ambulatory Visit: Payer: BC Managed Care – PPO | Admitting: Physical Therapy

## 2011-08-13 ENCOUNTER — Other Ambulatory Visit: Payer: Self-pay | Admitting: Internal Medicine

## 2011-08-13 DIAGNOSIS — Z9012 Acquired absence of left breast and nipple: Secondary | ICD-10-CM

## 2011-08-13 NOTE — Telephone Encounter (Signed)
Pt needs to have Dr. Darrelyn Hillock do rx because we don't have any of his notes. Pt aware of this.

## 2011-08-13 NOTE — Telephone Encounter (Signed)
Pt saw Dr Darrelyn Hillock on sat (08-09-11) per pt doc is requesting for her to have another round of prednisone. Please call [facility] [phone]

## 2011-08-15 ENCOUNTER — Other Ambulatory Visit: Payer: Self-pay | Admitting: Obstetrics and Gynecology

## 2011-08-19 ENCOUNTER — Other Ambulatory Visit: Payer: Self-pay | Admitting: Oncology

## 2011-08-19 ENCOUNTER — Other Ambulatory Visit (HOSPITAL_BASED_OUTPATIENT_CLINIC_OR_DEPARTMENT_OTHER): Payer: BC Managed Care – PPO | Admitting: Lab

## 2011-08-19 DIAGNOSIS — C50919 Malignant neoplasm of unspecified site of unspecified female breast: Secondary | ICD-10-CM

## 2011-08-19 DIAGNOSIS — C50419 Malignant neoplasm of upper-outer quadrant of unspecified female breast: Secondary | ICD-10-CM

## 2011-08-19 LAB — COMPREHENSIVE METABOLIC PANEL
Alkaline Phosphatase: 66 U/L (ref 39–117)
BUN: 22 mg/dL (ref 6–23)
Glucose, Bld: 97 mg/dL (ref 70–99)
Total Bilirubin: 0.3 mg/dL (ref 0.3–1.2)

## 2011-08-19 LAB — CBC WITH DIFFERENTIAL/PLATELET
Basophils Absolute: 0.1 10*3/uL (ref 0.0–0.1)
Eosinophils Absolute: 0.1 10*3/uL (ref 0.0–0.5)
HCT: 39.9 % (ref 34.8–46.6)
HGB: 13.5 g/dL (ref 11.6–15.9)
LYMPH%: 29.3 % (ref 14.0–49.7)
MCV: 91.5 fL (ref 79.5–101.0)
MONO%: 7.6 % (ref 0.0–14.0)
NEUT#: 4.8 10*3/uL (ref 1.5–6.5)
NEUT%: 60.3 % (ref 38.4–76.8)
Platelets: 206 10*3/uL (ref 145–400)
RBC: 4.36 10*6/uL (ref 3.70–5.45)

## 2011-08-21 ENCOUNTER — Ambulatory Visit: Payer: BC Managed Care – PPO | Attending: Oncology | Admitting: Physical Therapy

## 2011-08-21 DIAGNOSIS — I890 Lymphedema, not elsewhere classified: Secondary | ICD-10-CM | POA: Insufficient documentation

## 2011-08-21 DIAGNOSIS — IMO0001 Reserved for inherently not codable concepts without codable children: Secondary | ICD-10-CM | POA: Insufficient documentation

## 2011-08-25 ENCOUNTER — Encounter: Payer: BC Managed Care – PPO | Admitting: Physical Therapy

## 2011-08-25 ENCOUNTER — Ambulatory Visit (HOSPITAL_BASED_OUTPATIENT_CLINIC_OR_DEPARTMENT_OTHER): Payer: BC Managed Care – PPO | Admitting: Oncology

## 2011-08-25 ENCOUNTER — Telehealth: Payer: Self-pay | Admitting: Oncology

## 2011-08-25 DIAGNOSIS — C50419 Malignant neoplasm of upper-outer quadrant of unspecified female breast: Secondary | ICD-10-CM

## 2011-08-25 DIAGNOSIS — C50919 Malignant neoplasm of unspecified site of unspecified female breast: Secondary | ICD-10-CM

## 2011-08-25 DIAGNOSIS — E559 Vitamin D deficiency, unspecified: Secondary | ICD-10-CM

## 2011-08-25 DIAGNOSIS — I890 Lymphedema, not elsewhere classified: Secondary | ICD-10-CM

## 2011-08-25 NOTE — Progress Notes (Signed)
Hematology and Oncology Follow Up Visit  Chelsey [name] [id number] 1946/03/19 65 y.o. 08/25/2011 1:11 PM   DIAGNOSIS:   Encounter Diagnoses  Name Primary?  . Malignant neoplasm of breast (female), unspecified site   . Unspecified vitamin D deficiency      PAST THERAPY:  ddFEC followed by dd taxotere, followed by mrm 11/08/09, followed by cw xrt com,pleted 01/25/2010   Interim History:  Doing well, had recent rt arm pain resolved with prednisone, now lt hand numbness with associated lt arm lymphedema.  Medications: I have reviewed the patient's current medications.  Allergies:  Allergies  Allergen Reactions  . Amoxicillin     REACTION: unspecified  . Lipitor (Atorvastatin Calcium)     Leg cramps  . Penicillins     Past Medical History, Surgical history, Social history, and Family History were reviewed and updated.  Review of Systems: Constitutional:  Negative for fever, chills, night sweats, anorexia, weight loss, pain. Cardiovascular: no chest pain or dyspnea on exertion Respiratory: no cough, shortness of breath, or wheezing Neurological: negative Dermatological: negative ENT: negative Skin Gastrointestinal: no abdominal pain, change in bowel habits, or black or bloody stools Genito-Urinary: no dysuria, trouble voiding, or hematuria Hematological and Lymphatic: negative Breast: negative for breast lumps Musculoskeletal: positive for - joint pain and joint stiffness Remaining ROS negative.  Physical Exam:  Blood pressure 124/75, pulse 73, temperature 98.3 F (36.8 C), height 5\' 3"  (1.6 m), weight 186 lb 4.8 oz (84.505 kg).  ECOG: 0HEENT:  Sclerae anicteric, conjunctivae pink.  Oropharynx clear.  No mucositis or candidiasis.  Nodes:  No cervical, supraclavicular, or axillary lymphadenopathy palpated.  Breast Exam:  Right breast is benign.  No masses, discharge, skin change, or nipple inversion.  Left breast is s/p reconstruction, generally firm.  No masses, discharge,  skin change, or nipple inversion..  Lungs:  Clear to auscultation bilaterally.  No crackles, rhonchi, or wheezes.  Heart:  Regular rate and rhythm.  Abdomen:  Soft, nontender.  Positive bowel sounds.  No organomegaly or masses palpated.  Musculoskeletal:  No focal spinal tenderness to palpation.  Extremities:  Benign.  No peripheral edema or cyanosis.  Skin:  Benign.  Neuro:  Nonfocal.      Lab Results: Lab Results  Component Value Date   WBC 4.1* 11/14/2010   HGB 13.5 08/19/2011   HCT 39.9 08/19/2011   MCV 91.5 08/19/2011   PLT 206 08/19/2011     Chemistry      Component Value Date/Time   NA 141 08/19/2011 1342   NA 141 08/19/2011 1342   K 4.3 08/19/2011 1342   K 4.3 08/19/2011 1342   CL 102 08/19/2011 1342   CL 102 08/19/2011 1342   CO2 28 08/19/2011 1342   CO2 28 08/19/2011 1342   BUN 22 08/19/2011 1342   BUN 22 08/19/2011 1342   CREATININE 0.83 08/19/2011 1342   CREATININE 0.83 08/19/2011 1342      Component Value Date/Time   CALCIUM 9.5 08/19/2011 1342   CALCIUM 9.5 08/19/2011 1342   ALKPHOS 66 08/19/2011 1342   ALKPHOS 66 08/19/2011 1342   AST 13 08/19/2011 1342   AST 13 08/19/2011 1342   ALT 17 08/19/2011 1342   ALT 17 08/19/2011 1342   BILITOT 0.3 08/19/2011 1342   BILITOT 0.3 08/19/2011 1342       Radiological Studies:  Dg Chest 2 View  07/29/2011  *RADIOLOGY REPORT*  Clinical Data: Pneumonia  CHEST - 2 VIEW  Comparison: July 08, 2011 and  April 23, 2009  Findings: The cardiac silhouette, mediastinum, pulmonary vasculature are within normal limits.  Both lungs are clear.  Left mid lung opacity has resolved. There is no acute bony abnormality.  IMPRESSION: Interval resolution of left midlung opacity.  Original Report Authenticated By: Brandon Melnick, M.D.   Mm Digital Screening Unilat R  08/15/2011  DG SCREENING MAMMOGRAM RIGHT CC and MLO view(s) were taken of the right breast.  DIGITAL SCREENING MAMMOGRAM WITH CAD: There are scattered fibroglandular densities.  No masses or  malignant type calcifications are  identified.  Compared with prior studies.  Images were processed with CAD.  IMPRESSION: No specific mammographic evidence of malignancy.  Next screening mammogram is recommended in one  year.  A result letter of this screening mammogram will be mailed directly to the patient.  ASSESSMENT: Negative - BI-RADS 1  Screening mammogram in 1 year. ,     IMPRESSIONS AND PLAN: A 65 y.o. female with hx of locally advanced breast cancer, now s/p chemo, xrt and mrm, on arimidex , tolerating it well. I have recommended a lymphedema sleeve, f/u in 6 months, and continuitaion of arimidex. Her vitamin D level is low, so i also suggested increasing po intake.     Spent more than half the time coordinating care.    Ahnaf Caponi 11/12/20121:11 PM

## 2011-08-25 NOTE — Telephone Encounter (Signed)
Gv pt appt for may2013 

## 2011-08-27 ENCOUNTER — Ambulatory Visit: Payer: BC Managed Care – PPO | Admitting: Physical Therapy

## 2011-09-08 ENCOUNTER — Ambulatory Visit: Payer: BC Managed Care – PPO | Admitting: Physical Therapy

## 2011-09-10 ENCOUNTER — Ambulatory Visit: Payer: BC Managed Care – PPO | Admitting: Physical Therapy

## 2011-09-12 ENCOUNTER — Ambulatory Visit: Payer: BC Managed Care – PPO | Admitting: Physical Therapy

## 2011-09-24 ENCOUNTER — Ambulatory Visit: Payer: BC Managed Care – PPO | Attending: Oncology | Admitting: Physical Therapy

## 2011-09-24 DIAGNOSIS — I890 Lymphedema, not elsewhere classified: Secondary | ICD-10-CM | POA: Insufficient documentation

## 2011-09-24 DIAGNOSIS — IMO0001 Reserved for inherently not codable concepts without codable children: Secondary | ICD-10-CM | POA: Insufficient documentation

## 2011-09-29 ENCOUNTER — Encounter: Payer: BC Managed Care – PPO | Admitting: Physical Therapy

## 2011-10-02 ENCOUNTER — Encounter: Payer: Self-pay | Admitting: Internal Medicine

## 2012-01-14 ENCOUNTER — Other Ambulatory Visit: Payer: Self-pay | Admitting: Internal Medicine

## 2012-02-08 ENCOUNTER — Other Ambulatory Visit: Payer: Self-pay | Admitting: Internal Medicine

## 2012-02-17 ENCOUNTER — Other Ambulatory Visit: Payer: BC Managed Care – PPO | Admitting: Lab

## 2012-02-24 ENCOUNTER — Ambulatory Visit: Payer: BC Managed Care – PPO | Admitting: Oncology

## 2012-02-24 ENCOUNTER — Ambulatory Visit (HOSPITAL_BASED_OUTPATIENT_CLINIC_OR_DEPARTMENT_OTHER): Payer: BC Managed Care – PPO | Admitting: Lab

## 2012-02-24 ENCOUNTER — Telehealth: Payer: Self-pay | Admitting: Oncology

## 2012-02-24 VITALS — BP 126/77 | HR 73 | Temp 98.6°F | Ht 63.0 in | Wt 190.2 lb

## 2012-02-24 DIAGNOSIS — C50919 Malignant neoplasm of unspecified site of unspecified female breast: Secondary | ICD-10-CM

## 2012-02-24 DIAGNOSIS — C50419 Malignant neoplasm of upper-outer quadrant of unspecified female breast: Secondary | ICD-10-CM

## 2012-02-24 DIAGNOSIS — E559 Vitamin D deficiency, unspecified: Secondary | ICD-10-CM

## 2012-02-24 LAB — CBC WITH DIFFERENTIAL/PLATELET
BASO%: 1 % (ref 0.0–2.0)
Basophils Absolute: 0.1 10*3/uL (ref 0.0–0.1)
EOS%: 3 % (ref 0.0–7.0)
HGB: 13.4 g/dL (ref 11.6–15.9)
MCH: 30.9 pg (ref 25.1–34.0)
RDW: 13.5 % (ref 11.2–14.5)
lymph#: 1.6 10*3/uL (ref 0.9–3.3)
nRBC: 0 % (ref 0–0)

## 2012-02-24 NOTE — Telephone Encounter (Signed)
F/u appt today cx - appt scheduled w/wrong provider. Pt sent to lb and given new f/u to see PR 5/31.

## 2012-02-25 LAB — CANCER ANTIGEN 27.29: CA 27.29: 22 U/mL (ref 0–39)

## 2012-02-25 LAB — COMPREHENSIVE METABOLIC PANEL
ALT: 19 U/L (ref 0–35)
AST: 22 U/L (ref 0–37)
Albumin: 4.6 g/dL (ref 3.5–5.2)
Alkaline Phosphatase: 67 U/L (ref 39–117)
Potassium: 4.2 mEq/L (ref 3.5–5.3)
Sodium: 140 mEq/L (ref 135–145)
Total Protein: 6.6 g/dL (ref 6.0–8.3)

## 2012-03-11 ENCOUNTER — Ambulatory Visit (INDEPENDENT_AMBULATORY_CARE_PROVIDER_SITE_OTHER): Payer: BC Managed Care – PPO | Admitting: Internal Medicine

## 2012-03-11 ENCOUNTER — Encounter: Payer: Self-pay | Admitting: Internal Medicine

## 2012-03-11 VITALS — BP 120/70 | HR 77 | Temp 99.2°F | Wt 189.0 lb

## 2012-03-11 DIAGNOSIS — C50919 Malignant neoplasm of unspecified site of unspecified female breast: Secondary | ICD-10-CM

## 2012-03-11 DIAGNOSIS — E785 Hyperlipidemia, unspecified: Secondary | ICD-10-CM

## 2012-03-11 DIAGNOSIS — R7301 Impaired fasting glucose: Secondary | ICD-10-CM

## 2012-03-11 DIAGNOSIS — F4320 Adjustment disorder, unspecified: Secondary | ICD-10-CM

## 2012-03-11 DIAGNOSIS — I10 Essential (primary) hypertension: Secondary | ICD-10-CM

## 2012-03-11 MED ORDER — ALPRAZOLAM 0.25 MG PO TABS: 0.2500 mg | ORAL_TABLET | Freq: Every evening | ORAL | Status: DC | PRN

## 2012-03-11 NOTE — Progress Notes (Signed)
Subjective:    Patient ID: Chelsey [name], female    DOB: [birth year], 66 y.o.   MRN: [id number]  HPI  Patient comes in today for follow up of  multiple medical problems.  Her fu got delayed for various reasons.  Her mom recently dx with breast cancer.  And caretaking problems. Using xanax for anxiety for this  ,  Could use refill.  Coping.Still taking 25 of sertraline Had labs for Cancer center  hasnt been checking sugars recently  Has left arm lymphedema from surgery.  eval and has cts.  No se of crestor  Taking metformin wo side effects  Ht stable  Sometimes light headed.   Review of Systems No fever weight loss syncope  kness problematic to get knee replacement in near future  Past history family history social history reviewed in the electronic medical record. Outpatient Encounter Prescriptions as of 03/11/2012  Medication Sig Dispense Refill  . ALPRAZolam (XANAX) 0.25 MG tablet Take 1 tablet (0.25 mg total) by mouth at bedtime as needed for sleep or anxiety.  40 tablet  1  . anastrozole (ARIMIDEX) 1 MG tablet Take 1 mg by mouth daily.        . calcium carbonate (TUMS) 500 MG chewable tablet Chew 1 tablet by mouth as needed.        . cholecalciferol (VITAMIN D) 1000 UNITS tablet Take 1,000 Units by mouth daily.        . CRESTOR 10 MG tablet take 1 tablet by mouth once daily  30 tablet  4  . fluticasone (FLONASE) 50 MCG/ACT nasal spray Place 2 sprays into the nose daily.  16 g  11  . lisinopril (PRINIVIL,ZESTRIL) 20 MG tablet Take 1 tablet (20 mg total) by mouth daily.  90 tablet  3  . metFORMIN (GLUCOPHAGE-XR) 500 MG 24 hr tablet Take 2 tablets (1,000 mg total) by mouth daily with breakfast.  60 tablet  11  . NEXIUM 40 MG capsule TAKE 1 CAPSULE BY MOUTH ONCE DAILY IF NEEDED  30 capsule  5  . sertraline (ZOLOFT) 25 MG tablet Take 25 mg by mouth daily.        Marland Kitchen DISCONTD: ALPRAZolam (XANAX) 0.25 MG tablet take 1 to 2 tablets by mouth three times a day if needed  30 tablet  0  . DISCONTD:  mometasone (NASONEX) 50 MCG/ACT nasal spray 2 sprays by Nasal route daily.        . diclofenac (VOLTAREN) 75 MG EC tablet Take 75 mg by mouth 2 (two) times daily as needed.       Marland Kitchen DISCONTD: esomeprazole (NEXIUM) 20 MG packet Take 40 mg by mouth daily as needed.            Objective:   Physical Exam BP 120/70  Pulse 77  Temp(Src) 99.2 F (37.3 C) (Oral)  Wt 189 lb (85.73 kg)  SpO2 98% HEENT grossly normal  Neck: Supple without adenopathy or masses or bruits Chest:  Clear to A&P without wheezes rales or rhonchi CV:  S1-S2 no gallops or murmurs peripheral perfusion is normal Abdomen:  Sof,t normal bowel sounds without hepatosplenomegaly, no guarding rebound or masses no CVA tenderness Left arm mild lymphedema Oriented x 3. Normal cognition, attention, speech. Notr depressed appearing   Good eye contact . mildly anxious.   Chemistry      Component Value Date/Time   NA 140 02/24/2012 1044   K 4.2 02/24/2012 1044   CL 104 02/24/2012 1044   CO2 24  02/24/2012 1044   BUN 14 02/24/2012 1044   CREATININE 0.67 02/24/2012 1044      Component Value Date/Time   CALCIUM 10.1 02/24/2012 1044   ALKPHOS 67 02/24/2012 1044   AST 22 02/24/2012 1044   ALT 19 02/24/2012 1044   BILITOT 0.4 02/24/2012 1044    bg 172     Assessment & Plan:  Hx  Of breast ca Ht stable  No change hyperglycemia/ early diabetes  Got better with weight loss  Now elevated again   Unfortunately no hg a1c was done. Pt wants ti Intensify lifestyle interventions. Instead of adding more meds.L IPIDS  No se of meds  No labs done yet. Family illness  Mom with dementia dx with large br mass cancer   Caretaker stress.  Refill xnax x 2  Can ty increase zoloft to 50 mg if needed.   Total visit > 50% spent counseling and coordinating care   Labs reviewed   .  Plan labs at fu to check a1c and lipids

## 2012-03-11 NOTE — Patient Instructions (Signed)
Attend to diet and activity   Foods with low glycemic  Index.  Try tracking  Intake and activity  with free apps.  Cholesterol and chemistry and hg a1c    To be done

## 2012-03-12 ENCOUNTER — Telehealth: Payer: Self-pay | Admitting: *Deleted

## 2012-03-12 ENCOUNTER — Ambulatory Visit (HOSPITAL_BASED_OUTPATIENT_CLINIC_OR_DEPARTMENT_OTHER): Payer: BC Managed Care – PPO | Admitting: Oncology

## 2012-03-12 VITALS — BP 118/68 | HR 71 | Temp 98.7°F | Ht 63.0 in | Wt 189.4 lb

## 2012-03-12 DIAGNOSIS — C50919 Malignant neoplasm of unspecified site of unspecified female breast: Secondary | ICD-10-CM

## 2012-03-12 NOTE — Telephone Encounter (Signed)
made patient appointment for mammogram and bone density on 08-13-2012 at 8:00am gave patient appointment for 04-26-2012 for genetic counseling appointment gave patient appointment for 08-24-2012 starting at 8:30am with labs and then md appointment

## 2012-03-13 DIAGNOSIS — F4320 Adjustment disorder, unspecified: Secondary | ICD-10-CM | POA: Insufficient documentation

## 2012-03-14 NOTE — Progress Notes (Signed)
Hematology and Oncology Follow Up Visit  Chelsey [name] [id number] [birth year] 66 y.o. 03/14/2012 3:21 PM   DIAGNOSIS:   Encounter Diagnosis  Name Primary?  Marland Kitchen MALIGNANT NEOPLASM OF BREAST UNSPECIFIED SITE Yes     PAST THERAPY:  ddFEC followed by dd taxotere, followed by mrm 11/08/09, for Ypt 2, ypn1a, ypmx tumor,  followed by cw xrt com,pleted 01/25/2010 on arimidex.   Interim History:  Doing well, had recent rt arm pain resolved with prednisone, now lt hand numbness with associated lt arm lymphedema.  Medications: I have reviewed the patient's current medications.  Allergies:  Allergies  Allergen Reactions  . Amoxicillin     REACTION: unspecified  . Lipitor (Atorvastatin Calcium)     Leg cramps  . Penicillins     Past Medical History, Surgical history, Social history, and Family History were reviewed and updated.  Review of Systems: Constitutional:  Negative for fever, chills, night sweats, anorexia, weight loss, pain. Cardiovascular: no chest pain or dyspnea on exertion Respiratory: no cough, shortness of breath, or wheezing Neurological: negative Dermatological: negative ENT: negative Skin Gastrointestinal: no abdominal pain, change in bowel habits, or black or bloody stools Genito-Urinary: no dysuria, trouble voiding, or hematuria Hematological and Lymphatic: negative Breast: negative for breast lumps Musculoskeletal: positive for - joint pain and joint stiffness Remaining ROS negative.  Physical Exam:  Blood pressure 118/68, pulse 71, temperature 98.7 F (37.1 C), temperature source Oral, height 5\' 3"  (1.6 m), weight 189 lb 6.4 oz (85.911 kg).  ECOG: 0HEENT:  Sclerae anicteric, conjunctivae pink.  Oropharynx clear.  No mucositis or candidiasis.  Nodes:  No cervical, supraclavicular, or axillary lymphadenopathy palpated.  Breast Exam:  Right breast is benign.  No masses, discharge, skin change, or nipple inversion.  Left breast is s/p reconstruction, generally firm.   No masses, discharge, skin change, or nipple inversion..  Lungs:  Clear to auscultation bilaterally.  No crackles, rhonchi, or wheezes.  Heart:  Regular rate and rhythm.  Abdomen:  Soft, nontender.  Positive bowel sounds.  No organomegaly or masses palpated.  Musculoskeletal:  No focal spinal tenderness to palpation.  Extremities:  Benign.  No peripheral edema or cyanosis.  Skin:  Benign.  Neuro:  Nonfocal.      Lab Results: Lab Results  Component Value Date   WBC 5.5 02/24/2012   HGB 13.4 02/24/2012   HCT 39.2 02/24/2012   MCV 90.7 02/24/2012   PLT 229 02/24/2012     Chemistry      Component Value Date/Time   NA 140 02/24/2012 1044   K 4.2 02/24/2012 1044   CL 104 02/24/2012 1044   CO2 24 02/24/2012 1044   BUN 14 02/24/2012 1044   CREATININE 0.67 02/24/2012 1044      Component Value Date/Time   CALCIUM 10.1 02/24/2012 1044   ALKPHOS 67 02/24/2012 1044   AST 22 02/24/2012 1044   ALT 19 02/24/2012 1044   BILITOT 0.4 02/24/2012 1044       Radiological Studies:  Dg Chest 2 View  07/29/2011  *RADIOLOGY REPORT*  Clinical Data: Pneumonia  CHEST - 2 VIEW  Comparison: July 08, 2011 and April 23, 2009  Findings: The cardiac silhouette, mediastinum, pulmonary vasculature are within normal limits.  Both lungs are clear.  Left mid lung opacity has resolved. There is no acute bony abnormality.  IMPRESSION: Interval resolution of left midlung opacity.  Original Report Authenticated By: Brandon Melnick, M.D.   Mm Digital Screening Unilat R  08/15/2011  DG SCREENING  MAMMOGRAM RIGHT CC and MLO view(s) were taken of the right breast.  DIGITAL SCREENING MAMMOGRAM WITH CAD: There are scattered fibroglandular densities.  No masses or malignant type calcifications are  identified.  Compared with prior studies.  Images were processed with CAD.  IMPRESSION: No specific mammographic evidence of malignancy.  Next screening mammogram is recommended in one  year.  A result letter of this screening mammogram will be  mailed directly to the patient.  ASSESSMENT: Negative - BI-RADS 1  Screening mammogram in 1 year. ,     IMPRESSIONS AND PLAN: A 66 y.o. female with hx of locally advanced breast cancer, now s/p chemo, xrt and mrm, on arimidex , tolerating it well. I have recommended a lymphedema sleeve, f/u in 6 months, and continuitaion of arimidex. Her vitamin D level is better , so I have recommended further supplementation.   Spent more than half the time coordinating care.    Chelsey [name] 6/2/20133:21 PM

## 2012-04-23 ENCOUNTER — Ambulatory Visit (INDEPENDENT_AMBULATORY_CARE_PROVIDER_SITE_OTHER): Payer: Medicare Other | Admitting: Surgery

## 2012-04-23 ENCOUNTER — Encounter (INDEPENDENT_AMBULATORY_CARE_PROVIDER_SITE_OTHER): Payer: Self-pay | Admitting: Surgery

## 2012-04-23 VITALS — BP 124/76 | HR 72 | Resp 14 | Ht 64.0 in | Wt 192.6 lb

## 2012-04-23 DIAGNOSIS — Z853 Personal history of malignant neoplasm of breast: Secondary | ICD-10-CM | POA: Diagnosis not present

## 2012-04-23 NOTE — Patient Instructions (Signed)
Return 1 year. 

## 2012-04-23 NOTE — Progress Notes (Signed)
NAME: Chelsey [name]       DOB: 1946-02-25           DATE: 04/23/2012       MRN: [id number]   Chelsey [name] is a 66 y.o.Marland Kitchenfemale who presents for routine followup of her Left breast cancer diagnosed in 2010 and treated with  Left MRM . She has no problems or concerns on either side.  PFSH: She has had no significant changes since the last visit here.  ROS: There have been no significant changes since the last visit here  EXAM:  VS: BP 124/76  Pulse 72  Resp 14  Ht 5\' 4"  (1.626 m)  Wt 192 lb 9.6 oz (87.363 kg)  BMI 33.06 kg/m2   General: The patient is alert, oriented, generally healthy appearing, NAD. Mood and affect are normal.  Breasts:  left breast reconstruction noted no masses  Right breast reduction noted no masses   Lymphatics: She has no axillary or supraclavicular adenopathy on either side.  Mild left arm lymphedema noted  Extremities: Full ROM of the surgical side with no lymphedema noted.  Data Reviewed:    Results   Surgical pathology converted EChart (Order [id number])        Result Information       Abnormality Status Priority Source      Final result (09/10/2009 1233) Routine        Surgical pathology converted EChart      Status: Final result   MyChart: Not Released   Next appt with me: None      Result Narrative       [facility]  [address], [address]  [place], [place] [address]  Telephone [phone] or [phone] Fax [phone]   REPORT OF SURGICAL PATHOLOGY   Case #: [id number]  Patient Name: Chelsey [name], Chelsey [name]  Office Chart Number: [id number]   MRN: [id number]  Pathologist: Luisa Hart M.D., Jonny Ruiz  DOB/Age [birth year] (Age: 11) Gender: F  Date Taken: 09/04/2009  Date Received: 09/04/2009   FINAL DIAGNOSIS  Microscopic Examination and Diagnosis   1. METASTATIC CARCINOMA IN ONE LYMPH NODE.  2. METASTATIC CARCINOMA IN ONE LYMPH NODE.  3. ONE BENIGN LYMPH NODE.  4. INVASIVE DUCTAL CARCINOMA.MARGINS NOT INVOLVED.   CLINICAL HISTORY   SPECIMEN(S) OBTAINED  1. Lymph node, sentinel, biopsy, Left Axilla  2. Lymph node, sentinel, biopsy, Left Axilla  3. Lymph node, sentinel, biopsy, Left Axilla  4. Breast, simple mastectomy, Left   SPECIMEN COMMENTS:  1. Left breast cancer; neoadjnvant chemo   Gross Description  1. Rapid intraoperative consult performed (yes or no): Yes  Specimen: Left axillary sentinel lymph node, received fresh for rapid  intraoperative consult  Number and size: One, 1 cm  Cut surface(s): Tan-Pink with fatty infiltration.  Block summary: Touch preparations are submitted for rapid intraoperative consult  on one slide and the lymph node is entirely submitted in one cassette.  2. Rapid intraoperative consult performed (yes or no): Yes  Specimen: Left axillary sentinel lymph node, received fresh for rapid  intraoperative consult.  Number and size: One, 1.4 cm  Cut surface(s): Tan-Pink with fatty infiltration  Block summary: Touch preparations are submitted for rapid intraoperative consult  on one slide and the lymph node is entirely submitted in one cassette.  3. Rapid intraoperative consult performed (yes or no): Yes  Specimen: Left axillary sentinel lymph node, received fresh for rapid  intraoperative consult.  Number and size: One, 0.6 cm  Cut surface(s): Tan-Pink  with fatty infiltration  Block summary: Touch preparations are submitted for rapid intraoperative consult  on one slide and entirely submitted in one cassette. (Gp:Mj 09/04/09)  4. Specimen: Left simple mastectomy, received fresh.The specimen is placed in  formalin at 10:30 a.m.On 09/04/09  Specimen integrity (intact/disrupted): Intact  Weight: 1,024 grams  Size: The breast tissue measures 29 x 21 x 6 cm  Skin: There is an attached 24 x 13 cm ellipse of skin skin with a central  unremarkable nipple. There is a suture attached along the superior edge  Tumor/Cavity : In the mid lateral breast there is an ill defined  palpable  nodular area of dense white fibrous tissue which on sectioning is edematous.  This area measures 5 x 4 x 3 cm and there is a silver metallic biopsy clip  PRESENT in this area. This area is located 3 cm from the deep margin Uninvolved parenchyma: The remainder of the breast tissue consist of fat and  focally dense white fibrous tissue. In the lateral aspect of the breast there  is a 0.7 cm rubbery red nodule possibly representing an intraparenchymal lymph  node.  Prognostic indicators: Obtained from paraffin blocks if needed  Lymph nodes: A single potential intraparenchymal lymph node is identified  Block summary: 11 blocks  A- Nipple  B- Deep margin at mass  C-F- Sections of mass subsequentially labelled from lateral thru medial  G- Lower medial  H- Upper medial  I- Lower lateral  J- Upper lateral  K- Possible intraparenchymal lymph node (gp:mw 09/04/09)   MICROSCOPIC DESCRIPTION  4.  Specimen, including laterality: Left breast  Procedure: Mastectomy  Tumor size (gross measurement or glass slide measurement): 5 cm  Margins: Free of tumor  Invasive component, distance to closest margin: 3 cm from deep margin  In situ component, distance to closest margin: N/A  Lymph - vascular invasion: PRESENT  Histologic type, invasive component: Ductal  Grade, invasive component (nottingham combined histologic score): Ii  Tubule formation grade: 2  Nuclear pleomorphism grade: 2  Mitotic grade: 2  Ductal carcinoma in situ: No  Extensive intraductal component: No  Lobular neoplasia PRESENT: PRESENT, not identified  Treatment effect (if treated with neoadjuvant therapy): PRESENT  Multicentric (separate tumors in different quadrants): No  Multifocal (separate tumors in same quadrant or biopsy): No  Macroscopic or microscopic extent of tumor: Confined to breast  Skin: Free of tumor  Nipple: Free of tumor  Skeletal muscle: N/A  Tnm code: Ypt 2, ypn1a, ypmx  Breast prognostic  markers: Pm10-427  Estrogen receptor: 99%, positive  Progesterone receptor: 99%, positive  Ki 67 (mib-1): 30%  Her 2 neu by cish: No amplification, ratio 0.93  Non-Neoplastic breast: Fibrocystic changes  Comments: The area of the tumor shows dense fibrosis and patchy foci of residual  invasive ductal carcinoma. (Jdp:Mj 09/05/09)   CASE COMMENTS  Received fresh is tissue that contains a lymph node which is sampled for rapid intraoperative consult.  Left axillary sentinel lymph node by touch preparations-atypical cells PRESENT. (Bns)  Luisa Hart M.D., Jonny Ruiz, Pathologist, Electronic Signature  (Frozen section signed 11 24 2010)  Received fresh is tissue that contains a lymph node which is sampled for rapid intraoperative consult.  Left axillary sentinel lymph node by touch preparation-no tumor seen (bns)  Luisa Hart M.D., Jonny Ruiz, Pathologist, Electronic Signature  (Frozen section signed 11 24 2010)  Received fresh is tissue that contains a lymph node which is sampled for rapid intraoperative consult.  Left axillary sentinel lymph node by touch  preparations-no tumor seen. (Bns)  Luisa Hart M.D., Jonny Ruiz, Pathologist, Electronic Signature  (Frozen section signed 11 24 2010)         Order Details View Encounter Lab and Collection Details Routing Result History      Specimen Collected: 09/04/09 12:01 AM Last Resulted: 09/10/09 12:33 PM           Surgical pathology converted EChart      Status: Final result   MyChart: Not Released   Next appt with me: None      Result Narrative       [facility]  [address]  [place], [place] [address]  Telephone [phone] or [phone] Fax [phone]   REPORT OF SURGICAL PATHOLOGY   Case #: [id number]  Patient Name: Chelsey [name], Chelsey [name]  Office Chart Number: [id number]   MRN: [id number]  Pathologist: Luisa Hart M.D., Jonny Ruiz  DOB/Age 07/15/1946 (Age: 20) Gender: F  Date Taken: 09/04/2009  Date Received: 09/04/2009   FINAL DIAGNOSIS   Microscopic Examination and Diagnosis   1. METASTATIC CARCINOMA IN ONE LYMPH NODE.  2. METASTATIC CARCINOMA IN ONE LYMPH NODE.  3. ONE BENIGN LYMPH NODE.  4. INVASIVE DUCTAL CARCINOMA.MARGINS NOT INVOLVED.   CLINICAL HISTORY   SPECIMEN(S) OBTAINED  1. Lymph node, sentinel, biopsy, Left Axilla  2. Lymph node, sentinel, biopsy, Left Axilla  3. Lymph node, sentinel, biopsy, Left Axilla  4. Breast, simple mastectomy, Left   SPECIMEN COMMENTS:  1. Left breast cancer; neoadjnvant chemo   Gross Description  1. Rapid intraoperative consult performed (yes or no): Yes  Specimen: Left axillary sentinel lymph node, received fresh for rapid  intraoperative consult  Number and size: One, 1 cm  Cut surface(s): Tan-Pink with fatty infiltration.  Block summary: Touch preparations are submitted for rapid intraoperative consult  on one slide and the lymph node is entirely submitted in one cassette.  2. Rapid intraoperative consult performed (yes or no): Yes  Specimen: Left axillary sentinel lymph node, received fresh for rapid  intraoperative consult.  Number and size: One, 1.4 cm  Cut surface(s): Tan-Pink with fatty infiltration  Block summary: Touch preparations are submitted for rapid intraoperative consult  on one slide and the lymph node is entirely submitted in one cassette.  3. Rapid intraoperative consult performed (yes or no): Yes  Specimen: Left axillary sentinel lymph node, received fresh for rapid  intraoperative consult.  Number and size: One, 0.6 cm  Cut surface(s): Tan-Pink with fatty infiltration  Block summary: Touch preparations are submitted for rapid intraoperative consult  on one slide and entirely submitted in one cassette. (Gp:Mj 09/04/09)  4. Specimen: Left simple mastectomy, received fresh.The specimen is placed in  formalin at 10:30 a.m.On 09/04/09  Specimen integrity (intact/disrupted): Intact  Weight: 1,024 grams  Size: The breast tissue measures 29 x 21 x 6 cm   Skin: There is an attached 24 x 13 cm ellipse of skin skin with a central  unremarkable nipple. There is a suture attached along the superior edge  Tumor/Cavity : In the mid lateral breast there is an ill defined palpable  nodular area of dense white fibrous tissue which on sectioning is edematous.  This area measures 5 x 4 x 3 cm and there is a silver metallic biopsy clip  PRESENT in this area. This area is located 3 cm from the deep margin Uninvolved parenchyma: The remainder of the breast tissue consist of fat and  focally dense white fibrous tissue. In the lateral aspect of  the breast there  is a 0.7 cm rubbery red nodule possibly representing an intraparenchymal lymph  node.  Prognostic indicators: Obtained from paraffin blocks if needed  Lymph nodes: A single potential intraparenchymal lymph node is identified  Block summary: 11 blocks  A- Nipple  B- Deep margin at mass  C-F- Sections of mass subsequentially labelled from lateral thru medial  G- Lower medial  H- Upper medial  I- Lower lateral  J- Upper lateral  K- Possible intraparenchymal lymph node (gp:mw 09/04/09)   MICROSCOPIC DESCRIPTION  4.  Specimen, including laterality: Left breast  Procedure: Mastectomy  Tumor size (gross measurement or glass slide measurement): 5 cm  Margins: Free of tumor  Invasive component, distance to closest margin: 3 cm from deep margin  In situ component, distance to closest margin: N/A  Lymph - vascular invasion: PRESENT  Histologic type, invasive component: Ductal  Grade, invasive component (nottingham combined histologic score): Ii  Tubule formation grade: 2  Nuclear pleomorphism grade: 2  Mitotic grade: 2  Ductal carcinoma in situ: No  Extensive intraductal component: No  Lobular neoplasia PRESENT: PRESENT, not identified  Treatment effect (if treated with neoadjuvant therapy): PRESENT  Multicentric (separate tumors in different quadrants): No  Multifocal (separate tumors in same  quadrant or biopsy): No  Macroscopic or microscopic extent of tumor: Confined to breast  Skin: Free of tumor  Nipple: Free of tumor  Skeletal muscle: N/A  Tnm code: Ypt 2, ypn1a, ypmx  Breast prognostic markers: Pm10-427  Estrogen receptor: 99%, positive  Progesterone receptor: 99%, positive  Ki 67 (mib-1): 30%  Her 2 neu by cish: No amplification, ratio 0.93  Non-Neoplastic breast: Fibrocystic changes  Comments: The area of the tumor shows dense fibrosis and patchy foci of residual  invasive ductal carcinoma. (Jdp:Mj 09/05/09)   CASE COMMENTS  Received fresh is tissue that contains a lymph node which is sampled for rapid intraoperative consult.  Left axillary sentinel lymph node by touch preparations-atypical cells PRESENT. (Bns)  Luisa Hart M.D., Jonny Ruiz, Pathologist, Electronic Signature  (Frozen section signed 11 24 2010)  Received fresh is tissue that contains a lymph node which is sampled for rapid intraoperative consult.  Left axillary sentinel lymph node by touch preparation-no tumor seen (bns)  Luisa Hart M.D., Jonny Ruiz, Pathologist, Electronic Signature  (Frozen section signed 11 24 2010)  Received fresh is tissue that contains a lymph node which is sampled for rapid intraoperative consult.  Left axillary sentinel lymph node by touch preparations-no tumor seen. (Bns)  Luisa Hart M.D., Jonny Ruiz, Pathologist, Electronic Signature  (Frozen section signed 11 24 2010)         Order Details View Encounter Lab and Collection Details Routing Result History      Specimen Collected: 09/04/09 12:01 AM Last Resulted: 09/10/09 12:33 PM          Authorizing Provider Information     Name: Provider Default, MD Fax:      Phone: [phone] Pager:                 Surgical pathology converted EChart (Order [id number])  Pathology and Cytology  : [id number]   Authorizing: Provider Default  Department: Default Location   Date: 09/04/2009  Ordering: Lab Conversion Interface        Order  Information       Order Date/Time Release Date/Time Start Date/Time End Date/Time    09/04/2009 12:01 AM None 09/04/2009 12:01 AM None              Order Details  Frequency Duration Priority Order Class    None None Routine Normal                  Acc#    [id number]        Order Requisition     SURGICAL PATHOLOGY Berniece Pap [phone]) on 09/04/09         Collection Information       Collection Date Collection Time Resulting Agency    09/04/2009 12:01 AM ECHART REPORTS              Order Provider Info         Office phone Pager/beeper E-mail    Ordering User Lab Conversion Interface -- -- --    Authorizing Provider Provider Default [phone] -- --      Order-Level Documents:     There are no order-level documents.      Additional Information       Associated Reports    View Encounter    Priority and Order Details    Collection Information.      Impression: Doing well, with no evidence of recurrent cancer or new cancer  Plan: Will continue to follow up on an annual basis here.

## 2012-04-24 NOTE — Progress Notes (Signed)
No viisit

## 2012-04-26 ENCOUNTER — Encounter: Payer: Self-pay | Admitting: Genetic Counselor

## 2012-04-26 ENCOUNTER — Other Ambulatory Visit: Payer: Self-pay | Admitting: Oncology

## 2012-04-26 ENCOUNTER — Ambulatory Visit (HOSPITAL_BASED_OUTPATIENT_CLINIC_OR_DEPARTMENT_OTHER): Payer: Medicare Other | Admitting: Genetic Counselor

## 2012-04-26 ENCOUNTER — Other Ambulatory Visit: Payer: BC Managed Care – PPO

## 2012-04-26 DIAGNOSIS — Z803 Family history of malignant neoplasm of breast: Secondary | ICD-10-CM

## 2012-04-26 DIAGNOSIS — IMO0002 Reserved for concepts with insufficient information to code with codable children: Secondary | ICD-10-CM | POA: Diagnosis not present

## 2012-04-26 DIAGNOSIS — C50919 Malignant neoplasm of unspecified site of unspecified female breast: Secondary | ICD-10-CM

## 2012-04-26 DIAGNOSIS — C50419 Malignant neoplasm of upper-outer quadrant of unspecified female breast: Secondary | ICD-10-CM

## 2012-04-26 NOTE — Progress Notes (Signed)
Dr. Donnie Coffin requested a consultation for genetic counseling and risk assessment for Chelsey [name], a 66 y.o. female, for discussion of her breat cancer. She presents to clinic today to discuss the possibility of a genetic predisposition to cancer, and to further clarify her risks, as well as her family members' risks for cancer.   HISTORY OF PRESENT ILLNESS: In 2010, at the age of 35, Chelsey [name] was diagnosed with invasive ductal carcinoma of the breast.    Past Medical History  Diagnosis Date  . Diabetes mellitus   . Ulcer   . Allergy   . Arthritis     osteoarthritis  . Hypertension   . Hyperlipidemia   . GERD (gastroesophageal reflux disease)   . Sleep apnea   . Cancer     breast    Past Surgical History  Procedure Date  . Cesarean section   . Breast surgery 11/23    left mastectomy  . Reconstruction breast immediate / delayed w/ tissue expander     Left breast  . Skin graft     History  Substance Use Topics  . Smoking status: Never Smoker   . Smokeless tobacco: Not on file  . Alcohol Use: 1.2 oz/week    2 Glasses of wine per week    REPRODUCTIVE HISTORY AND PERSONAL RISK ASSESSMENT FACTORS: Menarche was at age 9.   Menopause was in her early 36s Uterus Intact: Yes Ovaries Intact: Yes [id number] , first live birth at age 48  She has previously undergone treatment for infertility.  Patient used clomid for 8 months - 1 year. OCP use for 4-5 years total.   She has used HRT in the past.    FAMILY HISTORY:  We obtained a detailed, 4-generation family history.  Significant diagnoses are listed below: Family History  Problem Relation Age of Onset  . Alzheimer's disease Mother   . Sleep apnea Mother   . Diabetes Mother   . Hypertension Mother   . Hyperlipidemia Mother   . Breast cancer Mother 48    triple negative  . Anxiety disorder Daughter   . Hypertension Son     pulmonary  . Congenital heart disease Son 0    TGA  died age 50   . Lung disease Sister   The  patient was diagnosed with breast cancer at age 33.  She had three children.  One son was born with transposition of the great vessels and died at age 37.  She is an only child.  Her mother was diagnosed at age 11 with triple negative breast cancer.  There is no other cancer history reported on either side of her family.  Patient's maternal ancestors are of [place] descent, and paternal ancestors are of [place] descent. There is no reported Ashkenazi Jewish ancestry. There is no known consanguinity.  GENETIC COUNSELING RISK ASSESSMENT, DISCUSSION, AND SUGGESTED FOLLOW UP: We reviewed the natural history and genetic etiology of sporadic, familial and hereditary cancer syndromes.  About 5-10% of breast cancer is hereditary.  Of this, about 85% is the result of a BRCA1 or BRCA2 mutation.  We reviewed the red flags of hereditary cancer syndromes and the dominant inheritance patterns.  Based on her family history she does not meet criteria for genetic testing.  The patient's personal and family history of breast cancer is suggestive of the following possible diagnosis: familial breast cancer  We discussed that identification of a hereditary cancer syndrome may help her care providers tailor  the patients medical management. If a mutation indicating hereditary breast cacner is detected in this case, the [employer] recommendations would include increased cancer surveillance and possible prophylactic surgery. If a mutation is detected, the patient will be referred back to the referring provider and to any additional appropriate care providers to discuss the relevant options.   If a mutation is not found in the patient, this will decrease the likelihood of a hereditary cancer syndrome as the explanation for her breast cancer. Cancer surveillance options would be discussed for the patient according to the appropriate standard National Comprehensive Cancer Network and American  Cancer Society guidelines, with consideration of their personal and family history risk factors. In this case, the patient will be referred back to their care providers for discussions of management.   In order to estimate her chance of having a BRCA1 or BRCA2 mutation, we used statistical models (Penn II, BOADCIA and CancerGenes) and laboratory data that take into account her personal medical history, family history and ancestry.  Because each model is different, there can be a lot of variability in the risks they give.  Therefore, these numbers must be considered a rough range and not a precise risk of having a BRCA1 or BRCA2 mutation.  These models estimate that she has approximately a 0.6-5% chance of having a mutation. Based on this assessment of her family and personal history, genetic testing is not recommended.  After considering the risks, benefits, and limitations, the patient declined testing based on that she does not meet criteria for genetic testing per Medicare guidelines.  The patient was seen for a total of 60 minutes, greater than 50% of which was spent face-to-face counseling.  This plan is being carried out per Dr. Renelda Loma recommendations.  This note will also be sent to the referring provider via the electronic medical record. The patient will be supplied with a summary of this genetic counseling discussion as well as educational information on the discussed hereditary cancer syndromes following the conclusion of their visit.   Patient was discussed with Dr. Drue Second.   _______________________________________________________________________ For Office Staff:  Number of people involved in session: 2 Was an Intern/ student involved with case: not applicable

## 2012-06-11 ENCOUNTER — Other Ambulatory Visit: Payer: BC Managed Care – PPO

## 2012-06-15 ENCOUNTER — Other Ambulatory Visit (INDEPENDENT_AMBULATORY_CARE_PROVIDER_SITE_OTHER): Payer: Medicare Other

## 2012-06-15 DIAGNOSIS — E119 Type 2 diabetes mellitus without complications: Secondary | ICD-10-CM

## 2012-06-15 LAB — LIPID PANEL
HDL: 59.5 mg/dL (ref >39.00)
LDL Cholesterol: 66 mg/dL (ref 0–99)
VLDL: 21.2 mg/dL (ref 0.0–40.0)

## 2012-06-15 LAB — BASIC METABOLIC PANEL
BUN: 14 mg/dL (ref 6–23)
Calcium: 9.4 mg/dL (ref 8.4–10.5)
GFR: 85 mL/min (ref >60.00)
Glucose, Bld: 132 mg/dL — ABNORMAL HIGH (ref 70–99)
Sodium: 140 mEq/L (ref 135–145)

## 2012-06-18 ENCOUNTER — Other Ambulatory Visit: Payer: Self-pay | Admitting: Internal Medicine

## 2012-06-18 ENCOUNTER — Telehealth: Payer: Self-pay

## 2012-06-18 DIAGNOSIS — E119 Type 2 diabetes mellitus without complications: Secondary | ICD-10-CM

## 2012-06-18 NOTE — Progress Notes (Signed)
Quick Note:  Called and spoke with pt and pt is aware. ______ 

## 2012-06-18 NOTE — Telephone Encounter (Signed)
Ok to refill x 2  

## 2012-06-18 NOTE — Telephone Encounter (Signed)
Pt states she needs a refill on alprazolam.  Rx last filled on 03/11/12 #40 x 1 rf.  Pt last seen 03/11/12.  Pls advise.   FYI:  Pt states she may need some help with her mother depending on when or if she gets out of the nursing home.

## 2012-06-21 ENCOUNTER — Other Ambulatory Visit: Payer: Self-pay | Admitting: Internal Medicine

## 2012-06-21 MED ORDER — ALPRAZOLAM 0.25 MG PO TABS: 0.2500 mg | ORAL_TABLET | Freq: Every evening | ORAL | Status: DC | PRN

## 2012-06-21 NOTE — Telephone Encounter (Signed)
Called to the pharmacy and left on voicemail. 

## 2012-07-20 ENCOUNTER — Other Ambulatory Visit: Payer: Self-pay | Admitting: Internal Medicine

## 2012-08-05 DIAGNOSIS — H40059 Ocular hypertension, unspecified eye: Secondary | ICD-10-CM | POA: Diagnosis not present

## 2012-08-05 DIAGNOSIS — H1045 Other chronic allergic conjunctivitis: Secondary | ICD-10-CM | POA: Diagnosis not present

## 2012-08-05 DIAGNOSIS — H01009 Unspecified blepharitis unspecified eye, unspecified eyelid: Secondary | ICD-10-CM | POA: Diagnosis not present

## 2012-08-12 DIAGNOSIS — L819 Disorder of pigmentation, unspecified: Secondary | ICD-10-CM | POA: Diagnosis not present

## 2012-08-12 DIAGNOSIS — C44721 Squamous cell carcinoma of skin of unspecified lower limb, including hip: Secondary | ICD-10-CM | POA: Diagnosis not present

## 2012-08-12 DIAGNOSIS — L821 Other seborrheic keratosis: Secondary | ICD-10-CM | POA: Diagnosis not present

## 2012-08-13 ENCOUNTER — Ambulatory Visit
Admission: RE | Admit: 2012-08-13 | Discharge: 2012-08-13 | Disposition: A | Discharge status: Home / Self Care | Payer: Medicare Other | Source: Ambulatory Visit | Attending: Oncology | Admitting: Oncology

## 2012-08-13 DIAGNOSIS — C50919 Malignant neoplasm of unspecified site of unspecified female breast: Secondary | ICD-10-CM

## 2012-08-13 DIAGNOSIS — Z1231 Encounter for screening mammogram for malignant neoplasm of breast: Secondary | ICD-10-CM | POA: Diagnosis not present

## 2012-08-13 DIAGNOSIS — Z853 Personal history of malignant neoplasm of breast: Secondary | ICD-10-CM | POA: Diagnosis not present

## 2012-08-13 DIAGNOSIS — M949 Disorder of cartilage, unspecified: Secondary | ICD-10-CM | POA: Diagnosis not present

## 2012-08-13 DIAGNOSIS — M899 Disorder of bone, unspecified: Secondary | ICD-10-CM | POA: Diagnosis not present

## 2012-08-19 ENCOUNTER — Other Ambulatory Visit: Payer: Self-pay | Admitting: Internal Medicine

## 2012-08-23 ENCOUNTER — Other Ambulatory Visit: Payer: Self-pay | Admitting: *Deleted

## 2012-08-23 ENCOUNTER — Other Ambulatory Visit: Payer: Self-pay | Admitting: Internal Medicine

## 2012-08-23 DIAGNOSIS — C50919 Malignant neoplasm of unspecified site of unspecified female breast: Secondary | ICD-10-CM

## 2012-08-24 ENCOUNTER — Ambulatory Visit: Payer: BC Managed Care – PPO | Admitting: Oncology

## 2012-08-24 ENCOUNTER — Other Ambulatory Visit: Payer: BC Managed Care – PPO | Admitting: Lab

## 2012-08-30 ENCOUNTER — Other Ambulatory Visit: Payer: Self-pay | Admitting: Dermatology

## 2012-08-30 DIAGNOSIS — C44721 Squamous cell carcinoma of skin of unspecified lower limb, including hip: Secondary | ICD-10-CM | POA: Diagnosis not present

## 2012-09-01 ENCOUNTER — Other Ambulatory Visit: Payer: Self-pay | Admitting: *Deleted

## 2012-09-06 ENCOUNTER — Ambulatory Visit (HOSPITAL_BASED_OUTPATIENT_CLINIC_OR_DEPARTMENT_OTHER): Payer: Medicare Other | Admitting: Oncology

## 2012-09-06 ENCOUNTER — Telehealth: Payer: Self-pay | Admitting: *Deleted

## 2012-09-06 ENCOUNTER — Other Ambulatory Visit: Payer: Self-pay | Admitting: Oncology

## 2012-09-06 ENCOUNTER — Other Ambulatory Visit (HOSPITAL_BASED_OUTPATIENT_CLINIC_OR_DEPARTMENT_OTHER): Payer: Medicare Other | Admitting: Lab

## 2012-09-06 VITALS — BP 118/73 | HR 67 | Temp 98.6°F | Resp 20 | Ht 64.0 in | Wt 198.8 lb

## 2012-09-06 DIAGNOSIS — E559 Vitamin D deficiency, unspecified: Secondary | ICD-10-CM

## 2012-09-06 DIAGNOSIS — C50419 Malignant neoplasm of upper-outer quadrant of unspecified female breast: Secondary | ICD-10-CM | POA: Diagnosis not present

## 2012-09-06 DIAGNOSIS — C50919 Malignant neoplasm of unspecified site of unspecified female breast: Secondary | ICD-10-CM

## 2012-09-06 LAB — CBC WITH DIFFERENTIAL/PLATELET
BASO%: 0.9 % (ref 0.0–2.0)
EOS%: 3.5 % (ref 0.0–7.0)
HCT: 39.9 % (ref 34.8–46.6)
MCH: 31.1 pg (ref 25.1–34.0)
MCHC: 34 g/dL (ref 31.5–36.0)
NEUT%: 64.5 % (ref 38.4–76.8)
RBC: 4.37 10*6/uL (ref 3.70–5.45)
lymph#: 1.2 10*3/uL (ref 0.9–3.3)

## 2012-09-06 LAB — COMPREHENSIVE METABOLIC PANEL (CC13)
ALT: 35 U/L (ref 0–55)
AST: 36 U/L — ABNORMAL HIGH (ref 5–34)
Albumin: 4 g/dL (ref 3.5–5.0)
Alkaline Phosphatase: 84 U/L (ref 40–150)
BUN: 12 mg/dL (ref 7.0–26.0)
Potassium: 4.1 mEq/L (ref 3.5–5.1)
Sodium: 139 mEq/L (ref 136–145)
Total Protein: 6.7 g/dL (ref 6.4–8.3)

## 2012-09-06 LAB — CANCER ANTIGEN 27.29: CA 27.29: 25 U/mL (ref 0–39)

## 2012-09-06 MED ORDER — ALENDRONATE SODIUM 35 MG PO TABS: 35.0000 mg | ORAL_TABLET | ORAL | Status: DC

## 2012-09-06 NOTE — Progress Notes (Signed)
Hematology and Oncology Follow Up Visit  Chelsey [name] [id number] [birth year] 66 y.o. 09/06/2012 9:02 AM   DIAGNOSIS:   No diagnosis found.   PAST THERAPY:  ddFEC followed by dd taxotere, followed by mrm 11/08/09, for Ypt 2, ypn1a, ypmx tumor,  followed by cw xrt completed 01/25/2010 on arimidex for ER PR positive breast cancer   Interim History:  Her mother who has advanced dementia was also diagnosed with breast cancer underwent a mastectomy and had a mass removed from her breast which is triple negative as well 6 at the 6 involved lymph nodes. She is currently in the dementia unit and is of not undergoing any form of followup adjuvant therapy. Chelsey [name] is otherwise doing well but does need left knee replacement. She had a recent bone density test which shows a fairly dramatic decrease in her bone density. Her mammogram which was done recently as well as within normal limits. She is taking Arimidex tolerates it well. She is taking vitamin D 1000 units a day.  Medications: I have reviewed the patient's current medications.  Allergies:  Allergies  Allergen Reactions  . Amoxicillin     REACTION: unspecified  . Lipitor (Atorvastatin Calcium)     Leg cramps  . Penicillins     Past Medical History, Surgical history, Social history, and Family History were reviewed and updated.  Review of Systems: Constitutional:  Negative for fever, chills, night sweats, anorexia, weight loss, pain. Cardiovascular: no chest pain or dyspnea on exertion Respiratory: no cough, shortness of breath, or wheezing Neurological: negative Dermatological: negative ENT: negative Skin Gastrointestinal: no abdominal pain, change in bowel habits, or black or bloody stools Genito-Urinary: no dysuria, trouble voiding, or hematuria Hematological and Lymphatic: negative Breast: negative for breast lumps Musculoskeletal: positive for - joint pain and joint stiffness Remaining ROS negative.  Physical Exam:  There were no  vitals taken for this visit.  ECOG: 0HEENT:  Sclerae anicteric, conjunctivae pink.  Oropharynx clear.  No mucositis or candidiasis.  Nodes:  No cervical, supraclavicular, or axillary lymphadenopathy palpated.  Breast Exam:  Right breast is benign.  No masses, discharge, skin change, or nipple inversion.  Left breast is s/p reconstruction, generally firm.  No masses, discharge, skin change, or nipple inversion..  Lungs:  Clear to auscultation bilaterally.  No crackles, rhonchi, or wheezes.  Heart:  Regular rate and rhythm.  Abdomen:  Soft, nontender.  Positive bowel sounds.  No organomegaly or masses palpated.  Musculoskeletal:  No focal spinal tenderness to palpation.  Extremities:  Benign.  No peripheral edema or cyanosis.  Skin:  Benign.  Neuro:  Nonfocal.      Lab Results: Lab Results  Component Value Date   WBC 5.4 09/06/2012   HGB 13.6 09/06/2012   HCT 39.9 09/06/2012   MCV 91.3 09/06/2012   PLT 210 09/06/2012     Chemistry      Component Value Date/Time   NA 140 06/15/2012 1016   K 4.2 06/15/2012 1016   CL 105 06/15/2012 1016   CO2 26 06/15/2012 1016   BUN 14 06/15/2012 1016   CREATININE 0.7 06/15/2012 1016      Component Value Date/Time   CALCIUM 9.4 06/15/2012 1016   ALKPHOS 67 02/24/2012 1044   AST 22 02/24/2012 1044   ALT 19 02/24/2012 1044   BILITOT 0.4 02/24/2012 1044       Radiological Studies:  Dg Chest 2 View  07/29/2011  *RADIOLOGY REPORT*  Clinical Data: Pneumonia  CHEST - 2 VIEW  Comparison: July 08, 2011 and April 23, 2009  Findings: The cardiac silhouette, mediastinum, pulmonary vasculature are within normal limits.  Both lungs are clear.  Left mid lung opacity has resolved. There is no acute bony abnormality.  IMPRESSION: Interval resolution of left midlung opacity.  Original Report Authenticated By: Brandon Melnick, M.D.   Mm Digital Screening Unilat R  08/15/2011  DG SCREENING MAMMOGRAM RIGHT CC and MLO view(s) were taken of the right breast.  DIGITAL SCREENING  MAMMOGRAM WITH CAD: There are scattered fibroglandular densities.  No masses or malignant type calcifications are  identified.  Compared with prior studies.  Images were processed with CAD.  IMPRESSION: No specific mammographic evidence of malignancy.  Next screening mammogram is recommended in one  year.  A result letter of this screening mammogram will be mailed directly to the patient.  ASSESSMENT: Negative - BI-RADS 1  Screening mammogram in 1 year. ,     IMPRESSIONS AND PLAN: A 66 y.o. female with hx of locally advanced breast cancer, now s/p chemo, xrt and mrm, on arimidex , tolerating it well. I have recommended a lymphedema sleeve, f/u in 6 months, and continuitaion of arimidex. She does have at the 19th 19% decrease in bone density I have recommended Low-dose Fosamax and doubling her vitamin D intake. She did have a referral for genetic testing was not felt to be a candidate because of a limited risk for developing genetically linked breast cancer. I have suggested she may want to consider going to the academic center for genetic testing regardless given the limited pedigree information and her family. I will see her in 6 months. Spent more than half the time coordinating care.    Chelsey [name] 11/25/20139:02 AM

## 2012-09-06 NOTE — Telephone Encounter (Signed)
Gave patient appointment for 03-2013 

## 2012-09-07 ENCOUNTER — Other Ambulatory Visit: Payer: Self-pay | Admitting: *Deleted

## 2012-09-07 DIAGNOSIS — M1990 Unspecified osteoarthritis, unspecified site: Secondary | ICD-10-CM | POA: Diagnosis not present

## 2012-09-07 DIAGNOSIS — C50919 Malignant neoplasm of unspecified site of unspecified female breast: Secondary | ICD-10-CM

## 2012-09-07 MED ORDER — ANASTROZOLE 1 MG PO TABS: 1.0000 mg | ORAL_TABLET | Freq: Every day | ORAL | Status: DC

## 2012-09-13 ENCOUNTER — Other Ambulatory Visit: Payer: Self-pay | Admitting: Internal Medicine

## 2012-09-13 ENCOUNTER — Telehealth: Payer: Self-pay | Admitting: Family Medicine

## 2012-09-13 MED ORDER — ALPRAZOLAM 0.25 MG PO TABS: 0.2500 mg | ORAL_TABLET | Freq: Every evening | ORAL | Status: DC | PRN

## 2012-09-13 NOTE — Telephone Encounter (Signed)
Last filled 06/21/12 #40 with 1 additional refill.  Please advise.

## 2012-09-13 NOTE — Telephone Encounter (Signed)
Ok to refill xanax x1 

## 2012-09-13 NOTE — Telephone Encounter (Signed)
Last seen 03/11/12

## 2012-09-13 NOTE — Telephone Encounter (Signed)
This patient is set to have surgery through [facility] on 12/20/12.  Please make a 30 minute appt before this date to see WP.  Please let me know when she is coming.

## 2012-09-13 NOTE — Telephone Encounter (Signed)
Pt is sch for surgery clearance on 1-14-201 815am. Pt also needs refill on alprazolam 0.25mg  [facility] [phone]

## 2012-09-13 NOTE — Telephone Encounter (Signed)
lmom for pt to call back

## 2012-09-13 NOTE — Telephone Encounter (Signed)
Called to the pharmacy and left on voicemail. 

## 2012-10-14 ENCOUNTER — Encounter: Payer: Self-pay | Admitting: Internal Medicine

## 2012-10-14 ENCOUNTER — Ambulatory Visit (INDEPENDENT_AMBULATORY_CARE_PROVIDER_SITE_OTHER): Payer: Medicare Other | Admitting: Internal Medicine

## 2012-10-14 VITALS — BP 118/58 | HR 67 | Ht 64.0 in | Wt 202.4 lb

## 2012-10-14 DIAGNOSIS — G4733 Obstructive sleep apnea (adult) (pediatric): Secondary | ICD-10-CM | POA: Diagnosis not present

## 2012-10-14 DIAGNOSIS — J309 Allergic rhinitis, unspecified: Secondary | ICD-10-CM | POA: Diagnosis not present

## 2012-10-14 NOTE — Progress Notes (Signed)
10/14/12-- 28 yoF never smoker here with husband. Hx OSA/ CPAP, complicated by history breast cancer, hypertension, allergic rhinitis           Here with husband follows for : last seen in 2010. pt wears mask 7 days week approx 8 hours . denies any mask or pressure issues.  NPSG 47.7/ hr Wearing CPAP 11/[facility] all night every night and says she can't sleep without it. She only started wearing CPAP last year after recovering from breast cancer surgery and reconstruction.  Now pending TKR  ROS-see HPI Constitutional:   No-   weight loss, night sweats, fevers, chills, fatigue, lassitude. HEENT:   No-  headaches, difficulty swallowing, tooth/dental problems, sore throat,       No-  sneezing, itching, ear ache, nasal congestion, post nasal drip,  CV:  No-   chest pain, orthopnea, PND, swelling in lower extremities, anasarca,                                  dizziness, palpitations Resp: No-   shortness of breath with exertion or at rest.              No-   productive cough,  No non-productive cough,  No- coughing up of blood.              No-   change in color of mucus.  No- wheezing.   Skin: No-   rash or lesions. GI:  No-   heartburn, indigestion, abdominal pain, nausea, vomiting,  GU:  MS:  + joint pain or swelling.   Neuro-     nothing unusual Psych:  No- change in mood or affect. No depression or anxiety.  No memory loss.  OBJ- Physical Exam General- Alert, Oriented, Affect-appropriate, Distress- none acute Skin- rash-none, lesions- none, excoriation- none Lymphadenopathy- none Head- atraumatic            Eyes- Gross vision intact, PERRLA, conjunctivae and secretions clear            Ears- Hearing, canals-normal            Nose- + prominent turbinates, no-Septal dev, mucus, polyps, erosion, perforation             Throat- Mallampati III , mucosa clear , drainage- none, tonsils- atrophic Neck- flexible , trachea midline, no stridor , thyroid nl, carotid no bruit Chest - symmetrical  excursion , unlabored           Heart/CV- RRR , no murmur , no gallop  , no rub, nl s1 s2                           - JVD- none , edema- none, stasis changes- none, varices- none           Lung- clear to P&A, wheeze- none, cough- none , dullness-none, rub- none           Chest wall-  Abd-  Br/ Gen/ Rectal- Not done, not indicated Extrem- cyanosis- none, clubbing, none, atrophy- none, strength- nl Neuro- grossly intact to observation

## 2012-10-14 NOTE — Patient Instructions (Addendum)
Try using you nasal steroid spray  1-2 puffs each nostril once daily at bedtime for at least a week  Order- Continue CPAP 11, mask of choice, humidifier and supplies   Dx OSA       Need pressure compliance download for Medicare documentation

## 2012-10-24 NOTE — Assessment & Plan Note (Addendum)
Good compliance and control now with CPAP 11/[facility]. Late start, delayed as she waited to recover from her breast cancer treatment Plan- download from DME for CPAP compliance for Medicare.  She should use CPAP when asleep during hospital time for her surgery. Replacement of mask and supplies when appropriate.

## 2012-10-24 NOTE — Assessment & Plan Note (Signed)
Suggested she retry nasal steroid at bedtime

## 2012-10-25 ENCOUNTER — Ambulatory Visit: Payer: Medicare Other | Admitting: Internal Medicine

## 2012-10-28 ENCOUNTER — Other Ambulatory Visit: Payer: Self-pay | Admitting: Internal Medicine

## 2012-11-01 ENCOUNTER — Other Ambulatory Visit: Payer: Self-pay | Admitting: Family Medicine

## 2012-11-01 MED ORDER — METFORMIN HCL ER 500 MG PO TB24: 1000.0000 mg | ORAL_TABLET | Freq: Every day | ORAL | Status: DC

## 2012-11-03 ENCOUNTER — Other Ambulatory Visit: Payer: Self-pay | Admitting: Internal Medicine

## 2012-11-05 ENCOUNTER — Encounter: Payer: Self-pay | Admitting: Internal Medicine

## 2012-11-05 ENCOUNTER — Ambulatory Visit (INDEPENDENT_AMBULATORY_CARE_PROVIDER_SITE_OTHER): Payer: Medicare Other | Admitting: Internal Medicine

## 2012-11-05 VITALS — BP 136/70 | HR 82 | Temp 98.2°F | Wt 201.0 lb

## 2012-11-05 DIAGNOSIS — M1710 Unilateral primary osteoarthritis, unspecified knee: Secondary | ICD-10-CM

## 2012-11-05 DIAGNOSIS — F4320 Adjustment disorder, unspecified: Secondary | ICD-10-CM

## 2012-11-05 DIAGNOSIS — G4733 Obstructive sleep apnea (adult) (pediatric): Secondary | ICD-10-CM

## 2012-11-05 DIAGNOSIS — R053 Chronic cough: Secondary | ICD-10-CM

## 2012-11-05 DIAGNOSIS — M179 Osteoarthritis of knee, unspecified: Secondary | ICD-10-CM

## 2012-11-05 DIAGNOSIS — C50919 Malignant neoplasm of unspecified site of unspecified female breast: Secondary | ICD-10-CM | POA: Diagnosis not present

## 2012-11-05 DIAGNOSIS — M1990 Unspecified osteoarthritis, unspecified site: Secondary | ICD-10-CM | POA: Diagnosis not present

## 2012-11-05 DIAGNOSIS — Z01818 Encounter for other preprocedural examination: Secondary | ICD-10-CM

## 2012-11-05 DIAGNOSIS — Z9989 Dependence on other enabling machines and devices: Secondary | ICD-10-CM

## 2012-11-05 DIAGNOSIS — E785 Hyperlipidemia, unspecified: Secondary | ICD-10-CM | POA: Diagnosis not present

## 2012-11-05 DIAGNOSIS — M792 Neuralgia and neuritis, unspecified: Secondary | ICD-10-CM

## 2012-11-05 DIAGNOSIS — I10 Essential (primary) hypertension: Secondary | ICD-10-CM

## 2012-11-05 DIAGNOSIS — R05 Cough: Secondary | ICD-10-CM | POA: Insufficient documentation

## 2012-11-05 DIAGNOSIS — R7301 Impaired fasting glucose: Secondary | ICD-10-CM

## 2012-11-05 DIAGNOSIS — E119 Type 2 diabetes mellitus without complications: Secondary | ICD-10-CM

## 2012-11-05 DIAGNOSIS — IMO0002 Reserved for concepts with insufficient information to code with codable children: Secondary | ICD-10-CM

## 2012-11-05 LAB — BASIC METABOLIC PANEL
BUN: 13 mg/dL (ref 6–23)
Chloride: 106 mEq/L (ref 96–112)
GFR: 82.29 mL/min (ref >60.00)
Potassium: 5.3 mEq/L — ABNORMAL HIGH (ref 3.5–5.1)

## 2012-11-05 LAB — LIPID PANEL
LDL Cholesterol: 81 mg/dL (ref 0–99)
Total CHOL/HDL Ratio: 3
VLDL: 26.4 mg/dL (ref 0.0–40.0)

## 2012-11-05 LAB — TSH: TSH: 1.58 u[IU]/mL (ref 0.35–5.50)

## 2012-11-05 LAB — HEMOGLOBIN A1C: Hgb A1c MFr Bld: 7.4 % — ABNORMAL HIGH (ref 4.6–6.5)

## 2012-11-05 MED ORDER — LOSARTAN POTASSIUM 50 MG PO TABS: 50.0000 mg | ORAL_TABLET | Freq: Every day | ORAL | Status: DC

## 2012-11-05 MED ORDER — DICLOFENAC SODIUM 1 % TD GEL: 4.0000 g | Freq: Four times a day (QID) | TRANSDERMAL | Status: DC

## 2012-11-05 NOTE — Patient Instructions (Addendum)
Change bp medication to one that could help cough . Take one a day . And monitor your blood pressure .  Avoid oral voltaren   Try topical voltaren .   Marland Kitchen   Will notify you  of labs when available.  Will send note to [facility]io.  Plan follow up depending on labs  Or 6 months.

## 2012-11-05 NOTE — Progress Notes (Signed)
Chief Complaint  Patient presents with  . Medical Clearance    Knee replacement (left) on March 10, 14. [facility]io    HPI: Patient comes in today for preoperative evaluation for total knee replacement left Dr. Despina Hick on March 10. She is also here for followup of her multiple medical problems.    She's had degenerative joint disease for quite a while and battling knee pain. However she developed breast cancer in the last couple years and had to delay her surgery. She underwent underwent a full course treatment. Chemotherapy radiation and breast reconstruction She is now finished with this medicine but is on her ARIMIDEX;has residual neuropathy tingling on her fingers and some lymphedema on her left upper extremity but otherwise is doing pretty well.  She's had no recent cardiovascular or pulmonary events stroke heart attack.  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   Hypertension she is on the center Prolite is controlled she does have a dry hacking cough but is always had throat clearing.  History of obstructive sleep apnea under treatment by Dr. Maple Hudson.  Prediabetes early diabetes on metformin daily no low blood sugars had been in very good control throughout chemotherapy and when her weight had gone down she has gained weight most recently hasn't checked her sugars.  Anxiety mood has been on sertraline low-dose only takes Xanax as needed at bedtime usually a half to one. She would like to continue on this at this time.  Osteopenia she states that her bone density was bad and was given Fosamax to take but she hasn't started it yet because she was told it caused bone pain and wanted to wait until after the surgery.  Lipids on Crestor due for lab tests today.   ROS: See pertinent positives and negatives per HPI. No unusual bleeding has had some swelling in her feet has residual lymphedema on her left upper remedy.  Past Medical History  Diagnosis  Date  . Diabetes mellitus   . Ulcer   . Allergy   . Arthritis     osteoarthritis  . Hypertension   . Hyperlipidemia   . GERD (gastroesophageal reflux disease)   . Sleep apnea   . Cancer     breast  . Pneumonia 07/28/2011    Family History  Problem Relation Age of Onset  . Alzheimer's disease Mother   . Sleep apnea Mother   . Diabetes Mother   . Hypertension Mother   . Hyperlipidemia Mother   . Breast cancer Mother 14    triple negative  . Anxiety disorder Daughter   . Hypertension Son     pulmonary  . Congenital heart disease Son 0    TGA  died age 5   . Lung disease Sister     History   Social History  . Marital Status: Married    Spouse Name: N/A    Number of Children: N/A  . Years of Education: N/A   Occupational History  . realtor    Social History Main Topics  . Smoking status: Never Smoker   . Smokeless tobacco: None  . Alcohol Use: 1.2 oz/week    2 Glasses of wine per week  . Drug Use: No  . Sexually Active: None   Other Topics Concern  . None   Social History Narrative   cb x 3Realtor married Bereaved  Parent2 adult childrenMother with dementia and breast cancer now in nursing home care.  Outpatient Encounter Prescriptions as of 11/05/2012  Medication Sig Dispense Refill  . alendronate (FOSAMAX) 35 MG tablet Take 1 tablet (35 mg total) by mouth every 7 (seven) days. Take with a full glass of water on an empty stomach.  12 tablet  4  . ALPRAZolam (XANAX) 0.25 MG tablet Take 1 tablet (0.25 mg total) by mouth at bedtime as needed for sleep or anxiety.  40 tablet  0  . anastrozole (ARIMIDEX) 1 MG tablet Take 1 tablet (1 mg total) by mouth daily.  30 tablet  7  . calcium carbonate (TUMS) 500 MG chewable tablet Chew 1 tablet by mouth as needed.        . cholecalciferol (VITAMIN D) 1000 UNITS tablet Take 1,000 Units by mouth daily.        . CRESTOR 10 MG tablet take 1 tablet by mouth once daily  30 each  0  . diclofenac (VOLTAREN) 75 MG EC tablet  Take 75 mg by mouth 2 (two) times daily as needed.       . metFORMIN (GLUCOPHAGE-XR) 500 MG 24 hr tablet Take 2 tablets (1,000 mg total) by mouth daily with breakfast.  60 tablet  0  . NEXIUM 40 MG capsule TAKE 1 CAPSULE BY MOUTH ONCE DAILY IF NEEDED  30 capsule  5  . sertraline (ZOLOFT) 25 MG tablet Take 25 mg by mouth daily.        . [DISCONTINUED] lisinopril (PRINIVIL,ZESTRIL) 20 MG tablet take 1 tablet by mouth once daily  30 tablet  5  . diclofenac sodium (VOLTAREN) 1 % GEL Apply 4 g topically 4 (four) times daily. As neede for arthitis pain knees  100 Tube  3  . fluticasone (FLONASE) 50 MCG/ACT nasal spray Place 2 sprays into the nose daily.  16 g  11  . losartan (COZAAR) 50 MG tablet Take 1 tablet (50 mg total) by mouth daily.  90 tablet  3  . [DISCONTINUED] mupirocin ointment (BACTROBAN) 2 %         EXAM: Wt Readings from Last 3 Encounters:  11/05/12 201 lb (91.173 kg)  10/14/12 202 lb 6.4 oz (91.808 kg)  09/06/12 198 lb 12.8 oz (90.175 kg)    BP 136/70  Pulse 82  Temp 98.2 F (36.8 C) (Oral)  Wt 201 lb (91.173 kg)  SpO2 98%  There is no height on file to calculate BMI.  GENERAL: vitals reviewed and listed above, alert, oriented, appears well hydrated and in no acute distress  HEENT: atraumatic, conjunctiva  clear, no obvious abnormalities on inspection of external nose and ears TMs are clear OP : no lesion edema or exudate  NECK: no obvious masses on inspection palpation no obvious adenopathy she does have some throat clearing nobruit LUNGS: clear to auscultation bilaterally, no wheezes, rales or rhonchi, good air movement CV: HRRR, no gallops or murmurs no clubbing cyanosis , has slight peripheral edema of the feet nl cap refill  Abdomen soft without organomegaly guarding or rebound MS: moves all extremities without noticeable focal  abnormality antalgic gait no acute redness Feet no unusual calluses good perfusion sensation intact. Slightly puffy PSYCH: pleasant and  cooperative, no obvious depression or anxiety  ASSESSMENT AND PLAN:  Discussed the following assessment and plan:  1. Degenerative joint disease of knee     Acceptable candidate for total knee replacement trial of topical voltarin try to avoid systemic because of its metabolic GI renal negative effects  2. MALIGNANT NEOPLASM OF BREAST UNSPECIFIED SITE  Lipid panel, Basic metabolic panel, TSH, Hemoglobin A1c  3. HYPERLIPIDEMIA  Lipid panel, Basic metabolic panel, TSH, Hemoglobin A1c  4. HYPERTENSION  Lipid panel, Basic metabolic panel, TSH, Hemoglobin A1c   controlled change to arb and seeif cough resolves  5. OSTEOARTHRITIS  Lipid panel, Basic metabolic panel, TSH, Hemoglobin A1c  6. Impaired fasting glucose  Lipid panel, Basic metabolic panel, TSH, Hemoglobin A1c   In the prediabetes borderline range hasn't been checked in a while. On metformin  7. Neuritis  Lipid panel, Basic metabolic panel, TSH, Hemoglobin A1c   Decrease finger sensation.  8. Adjustment disorder     situational sleep  ok to stay on low dose xanax for now 1/2 - 1 hs  9. Preoperative examination    10. Cough, persistent     poss from acei or allergy or  reflux  no alarm features  will change acei to arb.   11. OSA on CPAP    12. Diabetes mellitus, type 2     Early had been in control during chemotherapy is still on metformin, no known complication   counseled on weight management especially preoperatively /to avoid weight gain postoperatively. -Patient advised to return or notify health care team  immediately if symptoms worsen or persist or new concerns arise.  Patient Instructions  Change bp medication to one that could help cough . Take one a day . And monitor your blood pressure .  Avoid oral voltaren   Try topical voltaren .   Marland Kitchen   Will notify you  of labs when available.  Will send note to [facility].  Plan follow up depending on labs  Or 6 months.    Neta Mends. Kaci Freel M.D.

## 2012-11-08 ENCOUNTER — Encounter: Payer: Self-pay | Admitting: Family Medicine

## 2012-11-10 ENCOUNTER — Telehealth: Payer: Self-pay | Admitting: Internal Medicine

## 2012-11-10 NOTE — Telephone Encounter (Signed)
See message from [facility] for documentation.

## 2012-11-10 NOTE — Telephone Encounter (Signed)
Message copied by Waymon Budge on Wed Nov 10, 2012  4:00 PM ------      Message from: Henderson Newcomer      Created: Wed Nov 10, 2012  3:43 PM      Regarding: needed CPAP downloads       Good afternoon Dr. Maple Hudson.  My name is Henderson Newcomer and I have taken over for [place] as your [facility] rep.            Just wanted to let you know that we have been trying to get in touch with Chelsey [name] and her husband, Chelsey [name] (09/13/46) to obtain needed PAP downloads.       At one point, early in January, she advised that she would bring both cards in for download but never did.  We have left phone messages and mailed a letter to their home.       Please let me know if we can be of further assistance.            Thank you.

## 2012-11-16 ENCOUNTER — Other Ambulatory Visit: Payer: Self-pay | Admitting: Family Medicine

## 2012-11-16 ENCOUNTER — Other Ambulatory Visit: Payer: Self-pay | Admitting: Internal Medicine

## 2012-11-16 DIAGNOSIS — E119 Type 2 diabetes mellitus without complications: Secondary | ICD-10-CM

## 2012-12-01 ENCOUNTER — Other Ambulatory Visit: Payer: Self-pay | Admitting: Internal Medicine

## 2012-12-03 ENCOUNTER — Other Ambulatory Visit: Payer: Self-pay | Admitting: Family Medicine

## 2012-12-03 MED ORDER — METFORMIN HCL ER 500 MG PO TB24: ORAL_TABLET | ORAL | Status: DC

## 2012-12-06 NOTE — Progress Notes (Signed)
Drew- Dr Lequita Halt-  Need PRE OP ORDERS PLEASE   Thanks

## 2012-12-07 ENCOUNTER — Other Ambulatory Visit: Payer: Self-pay | Admitting: Orthopedic Surgery

## 2012-12-07 ENCOUNTER — Other Ambulatory Visit: Payer: Self-pay | Admitting: Family Medicine

## 2012-12-07 MED ORDER — BUPIVACAINE LIPOSOME 1.3 % IJ SUSP: 20.0000 mL | Freq: Once | INTRAMUSCULAR | Status: DC

## 2012-12-07 MED ORDER — DEXAMETHASONE SODIUM PHOSPHATE 10 MG/ML IJ SOLN: 10.0000 mg | Freq: Once | INTRAMUSCULAR | Status: DC

## 2012-12-07 NOTE — H&P (Signed)
Chelsey [name]  DOB: 03/31/1946 Married / Language: English / Race: White Female  Date of Admission:  12/20/2012  Chief Complaint:  Left Knee Pain  History of Present Illness The patient is a 67 year old female who comes in for a preoperative History and Physical. The patient is scheduled for a left total knee arthroplasty to be performed by Dr. Frank V. Aluisio, MD at [facility] on March 10. 2014. The patient is a 67 year old female who presents with knee complaints. The patient reports left knee symptoms including: pain and stiffness which began 20 year(s) ago without any known injury.The patient feels that the symptoms are worsening. Previous work-up for this problem has included knee x-rays. Symptoms are exacerbated by weight bearing. She had had multiple health issues in the past few years most significantly breast cancer. Fortunately she is currently in remission. She just saw Dr. Rubin recently and got a good report. Her biggest problem now is her knees. She had knee problems when we saw her in 2010 and was going to contemplate knee replacement at that surgery but the other health issues arose and took precedence. She feels as though the left knee is bothering her more than the right. It is hurting at all times. It is limiting what she can and can not do. She does have pain at night if she has had a very busy day. 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 She is ready to proceed with knee replacement so that she can become acitve again. They have been treated conservatively in the past for the above stated problem and despite conservative measures, they continue to have progressive pain and severe functional limitations and dysfunction. They have failed non-operative management including home exercise, medications, and injections. It is felt that they would benefit from undergoing total joint replacement. Risks and benefits of the procedure have been discussed with the patient and they  elect to proceed with surgery. There are no active contraindications to surgery such as ongoing infection or rapidly progressive neurological disease.   Allergies Penicillin G Sodium *PENICILLINS*. Childhood RXN Latex. Rash.   Family History Diabetes Mellitus. mother Hypertension. mother   Social History Alcohol use. current drinker; drinks wine; 5-7 per week Children. 2 Current work status. working full time Drug/Alcohol Rehab (Currently). no Drug/Alcohol Rehab (Previously). no Exercise. Exercises never Illicit drug use. no Living situation. live with spouse Marital status. married Number of flights of stairs before winded. 2-3 Pain Contract. no Tobacco / smoke exposure. no Tobacco use. never smoker   Medication History Vitamin D ( Oral) Specific dose unknown - Active. Arimidex (1MG Tablet, Oral) Active. Crestor (10MG Tablet, Oral) Active. Hydrocodone-Acetaminophen (5-325MG Tablet, Oral) Active. MetFORMIN HCl (500MG Tablet ER 24HR, Oral) Active. NexIUM (40MG Capsule DR, Oral) Active. PredniSONE (10MG Tablet, Oral) Active. Restasis (0.05% Emulsion, Ophthalmic) Active. Sertraline HCl (50MG Tablet, Oral) Active. Losartan Potassium (50MG Tablet, Oral) Active.   Past Surgical History Breast Reconstruction. left Cesarean Delivery. 3 or more times Colon Polyp Removal - Colonoscopy Mammoplasty; Reduction. right Mastectomy. left  Medical History Anxiety Disorder Breast Cancer Diabetes Mellitus, Type II Gastroesophageal Reflux Disease High blood pressure Hypercholesterolemia   Review of Systems General:Not Present- Chills, Fever, Night Sweats, Fatigue, Weight Gain, Weight Loss and Memory Loss. Skin:Not Present- Hives, Itching, Rash, Eczema and Lesions. HEENT:Not Present- Tinnitus, Headache, Double Vision, Visual Loss, Hearing Loss and Dentures. Respiratory:Not Present- Shortness of breath with exertion, Shortness of breath at rest,  Allergies, Coughing up blood and Chronic Cough.   Cardiovascular:Not Present- Chest Pain, Racing/skipping heartbeats, Difficulty Breathing Lying Down, Murmur, Swelling and Palpitations. Gastrointestinal:Not Present- Bloody Stool, Heartburn, Abdominal Pain, Vomiting, Nausea, Constipation, Diarrhea, Difficulty Swallowing, Jaundice and Loss of appetitie. Female Genitourinary:Not Present- Blood in Urine, Urinary frequency, Weak urinary stream, Discharge, Flank Pain, Incontinence, Painful Urination, Urgency, Urinary Retention and Urinating at Night. Musculoskeletal:Present- Joint Pain. Not Present- Muscle Weakness, Muscle Pain, Joint Swelling, Back Pain, Morning Stiffness and Spasms. Neurological:Not Present- Tremor, Dizziness, Blackout spells, Paralysis, Difficulty with balance and Weakness. Psychiatric:Not Present- Insomnia.   Vitals Weight: 200 lb Height: 63.5 in Body Surface Area: 2.02 m Body Mass Index: 34.87 kg/m Pulse: 64 (Regular) Resp.: 16 (Unlabored) BP: 132/74 (Sitting, Right Arm, Standard)    Physical Exam The physical exam findings are as follows:  Note: Patient is a 67 year old female with continued knee pain.   General Mental Status - Alert, cooperative and good historian. General Appearance- pleasant. Not in acute distress. Orientation- Oriented X3. Build & Nutrition- Well nourished and Well developed.   Head and Neck Head- normocephalic, atraumatic . Neck Global Assessment- supple. no bruit auscultated on the right and no bruit auscultated on the left.   Eye Pupil- Bilateral- Regular and Round. Motion- Bilateral- EOMI.   Chest and Lung Exam Auscultation: Breath sounds:- clear at anterior chest wall and - clear at posterior chest wall. Adventitious sounds:- No Adventitious sounds.   Cardiovascular Auscultation:Rhythm- Regular rate and rhythm. Heart Sounds- S1 WNL and S2 WNL. Murmurs & Other Heart Sounds:Auscultation  of the heart reveals - No Murmurs.   Abdomen Palpation/Percussion:Tenderness- Abdomen is non-tender to palpation. Rigidity (guarding)- Abdomen is soft. Auscultation:Auscultation of the abdomen reveals - Bowel sounds normal.   Female Genitourinary  Not done, not pertinent to present illness  Musculoskeletal Upper Extremity Left Upper Extremity: General:Neurovascular Status- Neurovascularly intact throughout. Note: She does have lymphedema of the left arm. NEEDS TO AVOID BP AND LABS IN THE LEFT ARM. On exam well developed female alert and oriented in no apparent distress. Both hips have normal range of motion with no discomfort. Her left knee shows varus deformity, range 10 to 120, marked crepitus on range of motion, tenderness medial greater than lateral with no instability noted. Right knee no effusion, slight varus, range 0 to 120, moderate crepitus on range of motion, tender medial greater than lateral with no instability. Pulses, sensation and motor are intact both lower extremities.  RADIOGRAPHS: AP both knees and lateral show that she has advanced endstage arthritis both knees medial and patellofemoral compartments with varus deformity worse on the left than the right.  Assessment & Plan Primary osteoarthritis of one knee (715.16) Impression: Left Knee  Note: Plan is for a Left Total Knee Replacement by Dr. Aluisio.  Plan is to go home following the surgery.  PCP - Dr. Panosh - Patient has been seen preoperatively and felt to be stable for surgery as per surgery. NEEDS TO AVOID BP AND LABS IN THE LEFT ARM.  Signed electronically by DREW L PERKINS, PA-C  

## 2012-12-07 NOTE — Progress Notes (Signed)
Preoperative surgical orders have been place into the Epic hospital system for Chelsey [name] on 12/07/2012, 10:15 AM  by Patrica Duel for surgery on 12/20/2012.  Preop Total Knee orders including Experal, IV Tylenol, and IV Decadron as long as there are no contraindications to the above medications. Avel Peace, PA-C

## 2012-12-13 ENCOUNTER — Encounter (HOSPITAL_COMMUNITY): Payer: Self-pay | Admitting: Pharmacy Technician

## 2012-12-14 ENCOUNTER — Other Ambulatory Visit (HOSPITAL_COMMUNITY): Payer: Self-pay | Admitting: Orthopedic Surgery

## 2012-12-15 ENCOUNTER — Encounter (HOSPITAL_COMMUNITY)
Admission: RE | Admit: 2012-12-15 | Discharge: 2012-12-15 | Disposition: A | Discharge status: Home / Self Care | Payer: Medicare Other | Source: Ambulatory Visit | Attending: Orthopedic Surgery | Admitting: Orthopedic Surgery

## 2012-12-15 ENCOUNTER — Encounter (HOSPITAL_COMMUNITY): Payer: Self-pay

## 2012-12-15 ENCOUNTER — Ambulatory Visit (HOSPITAL_COMMUNITY)
Admission: RE | Admit: 2012-12-15 | Discharge: 2012-12-15 | Disposition: A | Discharge status: Home / Self Care | Payer: Medicare Other | Source: Ambulatory Visit | Attending: Orthopedic Surgery | Admitting: Orthopedic Surgery

## 2012-12-15 DIAGNOSIS — K8020 Calculus of gallbladder without cholecystitis without obstruction: Secondary | ICD-10-CM | POA: Insufficient documentation

## 2012-12-15 DIAGNOSIS — Z01812 Encounter for preprocedural laboratory examination: Secondary | ICD-10-CM | POA: Insufficient documentation

## 2012-12-15 DIAGNOSIS — Z01818 Encounter for other preprocedural examination: Secondary | ICD-10-CM | POA: Diagnosis not present

## 2012-12-15 DIAGNOSIS — Z01810 Encounter for preprocedural cardiovascular examination: Secondary | ICD-10-CM | POA: Diagnosis not present

## 2012-12-15 DIAGNOSIS — I700 Atherosclerosis of aorta: Secondary | ICD-10-CM | POA: Insufficient documentation

## 2012-12-15 LAB — CBC
HCT: 40.1 % (ref 36.0–46.0)
MCHC: 33.9 g/dL (ref 30.0–36.0)
Platelets: 214 10*3/uL (ref 150–400)
RDW: 12.9 % (ref 11.5–15.5)
WBC: 5.4 10*3/uL (ref 4.0–10.5)

## 2012-12-15 LAB — URINALYSIS, ROUTINE W REFLEX MICROSCOPIC
Hgb urine dipstick: NEGATIVE
Nitrite: NEGATIVE
Specific Gravity, Urine: 1.009 (ref 1.005–1.030)
Urobilinogen, UA: 0.2 mg/dL (ref 0.0–1.0)
pH: 5.5 (ref 5.0–8.0)

## 2012-12-15 LAB — APTT: aPTT: 29 seconds (ref 24–37)

## 2012-12-15 LAB — COMPREHENSIVE METABOLIC PANEL
AST: 39 U/L — ABNORMAL HIGH (ref 0–37)
Albumin: 4.1 g/dL (ref 3.5–5.2)
Alkaline Phosphatase: 79 U/L (ref 39–117)
BUN: 12 mg/dL (ref 6–23)
Potassium: 4.4 mEq/L (ref 3.5–5.1)
Total Protein: 7.2 g/dL (ref 6.0–8.3)

## 2012-12-15 LAB — URINE MICROSCOPIC-ADD ON

## 2012-12-15 LAB — PROTIME-INR
INR: 0.93 (ref 0.00–1.49)
Prothrombin Time: 12.4 seconds (ref 11.6–15.2)

## 2012-12-15 NOTE — Patient Instructions (Signed)
20      Your procedure is scheduled on:  Monday 12/20/2012  Report to [id number]  AM.  Call this number if you have problems the morning of surgery: [phone]   Remember:             IF YOU USE CPAP,BRING MASK AND TUBING AM OF SURGERY!   Do not eat food or drink liquids AFTER MIDNIGHT!  Take these medicines the morning of surgery with A SIP OF WATER: Prilosec,use eye drops if needed and Flonase nasal spray if needed   Do not bring valuables to the hospital.  .  Leave suitcase in the car. After surgery it may be brought to your room.  For patients admitted to the hospital, checkout time is 11:00 AM the day of              Discharge.    DO NOT WEAR JEWELRY , MAKE-UP, LOTIONS,POWDERS,PERFUMES!             WOMEN -DO NOT SHAVE LEGS OR UNDERARMS 12 HRS. BEFORE SURGERY!               MEN MAY SHAVE AS USUAL!             CONTACTS,DENTURES OR BRIDGEWORK, FALSE EYELASHES MAY NOT BE WORN INTO SURGERY!                                           Patients discharged the day of surgery will not be allowed to drive home. If going home the same day of surgery, must have someone stay with youfirst 24 hrs.at home and arrange for someone to drive you home from the Hospital.                         YOUR DRIVER IS:Don-spouse   Special Instructions:             Please read over the following fact sheets that you were given:             1. [facility] PREPARING FOR SURGERY SHEET              2.[facility] INFORMATION              3.[facility]                                        Telford Nab.Nanney,RN,BSN     [phone]                FAILURE TO FOLLOW THESE INSTRUCTIONS MAY RESULT IN  CANCELLATION OF YOUR SURGERY!               Patient Signature:___________________________

## 2012-12-20 ENCOUNTER — Inpatient Hospital Stay (HOSPITAL_COMMUNITY): Payer: Medicare Other | Admitting: Anesthesiology

## 2012-12-20 ENCOUNTER — Encounter (HOSPITAL_COMMUNITY)
Admission: RE | Disposition: A | Discharge status: Home Health | Payer: Self-pay | Source: Ambulatory Visit | Attending: Orthopedic Surgery

## 2012-12-20 ENCOUNTER — Encounter (HOSPITAL_COMMUNITY): Payer: Self-pay | Admitting: Anesthesiology

## 2012-12-20 ENCOUNTER — Encounter (HOSPITAL_COMMUNITY): Payer: Self-pay | Admitting: *Deleted

## 2012-12-20 ENCOUNTER — Inpatient Hospital Stay (HOSPITAL_COMMUNITY)
Admission: RE | Admit: 2012-12-20 | Discharge: 2012-12-22 | DRG: 470 | Disposition: A | Discharge status: Home Health | Payer: Medicare Other | Source: Ambulatory Visit | Attending: Orthopedic Surgery | Admitting: Orthopedic Surgery

## 2012-12-20 DIAGNOSIS — F411 Generalized anxiety disorder: Secondary | ICD-10-CM | POA: Diagnosis present

## 2012-12-20 DIAGNOSIS — IMO0002 Reserved for concepts with insufficient information to code with codable children: Secondary | ICD-10-CM | POA: Diagnosis not present

## 2012-12-20 DIAGNOSIS — E119 Type 2 diabetes mellitus without complications: Secondary | ICD-10-CM | POA: Diagnosis not present

## 2012-12-20 DIAGNOSIS — I10 Essential (primary) hypertension: Secondary | ICD-10-CM | POA: Diagnosis not present

## 2012-12-20 DIAGNOSIS — Z9010 Acquired absence of unspecified breast and nipple: Secondary | ICD-10-CM

## 2012-12-20 DIAGNOSIS — G4730 Sleep apnea, unspecified: Secondary | ICD-10-CM | POA: Diagnosis present

## 2012-12-20 DIAGNOSIS — E871 Hypo-osmolality and hyponatremia: Secondary | ICD-10-CM | POA: Diagnosis not present

## 2012-12-20 DIAGNOSIS — Z79899 Other long term (current) drug therapy: Secondary | ICD-10-CM

## 2012-12-20 DIAGNOSIS — K219 Gastro-esophageal reflux disease without esophagitis: Secondary | ICD-10-CM | POA: Diagnosis present

## 2012-12-20 DIAGNOSIS — Z96652 Presence of left artificial knee joint: Secondary | ICD-10-CM

## 2012-12-20 DIAGNOSIS — E7800 Pure hypercholesterolemia, unspecified: Secondary | ICD-10-CM | POA: Diagnosis present

## 2012-12-20 DIAGNOSIS — M179 Osteoarthritis of knee, unspecified: Secondary | ICD-10-CM

## 2012-12-20 DIAGNOSIS — Z880 Allergy status to penicillin: Secondary | ICD-10-CM | POA: Diagnosis not present

## 2012-12-20 DIAGNOSIS — M1710 Unilateral primary osteoarthritis, unspecified knee: Secondary | ICD-10-CM | POA: Diagnosis not present

## 2012-12-20 DIAGNOSIS — D62 Acute posthemorrhagic anemia: Secondary | ICD-10-CM

## 2012-12-20 DIAGNOSIS — E876 Hypokalemia: Secondary | ICD-10-CM

## 2012-12-20 DIAGNOSIS — Z853 Personal history of malignant neoplasm of breast: Secondary | ICD-10-CM

## 2012-12-20 HISTORY — PX: TOTAL KNEE ARTHROPLASTY: SHX125

## 2012-12-20 SURGERY — ARTHROPLASTY, KNEE, TOTAL
Anesthesia: Spinal | Site: Knee | Laterality: Left | Wound class: Clean

## 2012-12-20 MED ORDER — PHENYLEPHRINE HCL 10 MG/ML IJ SOLN
INTRAMUSCULAR | Status: DC | PRN
  Administered 2012-12-20 (×3): 40 ug via INTRAVENOUS

## 2012-12-20 MED ORDER — ONDANSETRON HCL 4 MG/2ML IJ SOLN
INTRAMUSCULAR | Status: DC | PRN
  Administered 2012-12-20: 4 mg via INTRAVENOUS

## 2012-12-20 MED ORDER — FLEET ENEMA 7-19 GM/118ML RE ENEM: 1.0000 | ENEMA | Freq: Once | RECTAL | Status: AC | PRN

## 2012-12-20 MED ORDER — PHENOL 1.4 % MT LIQD
1.0000 | OROMUCOSAL | Status: DC | PRN
  Filled 2012-12-20: qty 177

## 2012-12-20 MED ORDER — PROMETHAZINE HCL 25 MG/ML IJ SOLN: 6.2500 mg | INTRAMUSCULAR | Status: DC | PRN

## 2012-12-20 MED ORDER — METHOCARBAMOL 100 MG/ML IJ SOLN: 500.0000 mg | Freq: Four times a day (QID) | INTRAVENOUS | Status: DC | PRN

## 2012-12-20 MED ORDER — EPHEDRINE SULFATE 50 MG/ML IJ SOLN
INTRAMUSCULAR | Status: DC | PRN
  Administered 2012-12-20 (×2): 5 mg via INTRAVENOUS

## 2012-12-20 MED ORDER — ACETAMINOPHEN 325 MG PO TABS: 650.0000 mg | ORAL_TABLET | Freq: Four times a day (QID) | ORAL | Status: DC | PRN

## 2012-12-20 MED ORDER — METHOCARBAMOL 500 MG PO TABS
500.0000 mg | ORAL_TABLET | Freq: Four times a day (QID) | ORAL | Status: DC | PRN
  Administered 2012-12-20 – 2012-12-22 (×4): 500 mg via ORAL
  Filled 2012-12-20 (×3): qty 1

## 2012-12-20 MED ORDER — BUPIVACAINE IN DEXTROSE 0.75-8.25 % IT SOLN
INTRATHECAL | Status: DC | PRN
  Administered 2012-12-20: 1.8 mL via INTRATHECAL

## 2012-12-20 MED ORDER — SODIUM CHLORIDE 0.9 % IV SOLN: INTRAVENOUS | Status: DC

## 2012-12-20 MED ORDER — ACETAMINOPHEN 650 MG RE SUPP: 650.0000 mg | Freq: Four times a day (QID) | RECTAL | Status: DC | PRN

## 2012-12-20 MED ORDER — ONDANSETRON HCL 4 MG PO TABS: 4.0000 mg | ORAL_TABLET | Freq: Four times a day (QID) | ORAL | Status: DC | PRN

## 2012-12-20 MED ORDER — LACTATED RINGERS IV SOLN
INTRAVENOUS | Status: DC
  Administered 2012-12-20: 12:00:00 via INTRAVENOUS
  Administered 2012-12-20: 1000 mL via INTRAVENOUS

## 2012-12-20 MED ORDER — DIPHENHYDRAMINE HCL 12.5 MG/5ML PO ELIX: 12.5000 mg | ORAL_SOLUTION | ORAL | Status: DC | PRN

## 2012-12-20 MED ORDER — PROPOFOL 10 MG/ML IV EMUL
INTRAVENOUS | Status: DC | PRN
  Administered 2012-12-20: 100 ug/kg/min via INTRAVENOUS

## 2012-12-20 MED ORDER — METOCLOPRAMIDE HCL 5 MG PO TABS
5.0000 mg | ORAL_TABLET | Freq: Three times a day (TID) | ORAL | Status: DC | PRN
  Filled 2012-12-20: qty 2

## 2012-12-20 MED ORDER — MENTHOL 3 MG MT LOZG
1.0000 | LOZENGE | OROMUCOSAL | Status: DC | PRN
  Filled 2012-12-20: qty 9

## 2012-12-20 MED ORDER — LACTATED RINGERS IV SOLN: INTRAVENOUS | Status: DC

## 2012-12-20 MED ORDER — SERTRALINE HCL 25 MG PO TABS
25.0000 mg | ORAL_TABLET | Freq: Every day | ORAL | Status: DC
  Administered 2012-12-20 – 2012-12-21 (×2): 25 mg via ORAL
  Filled 2012-12-20 (×3): qty 1

## 2012-12-20 MED ORDER — BUPIVACAINE LIPOSOME 1.3 % IJ SUSP
20.0000 mL | Freq: Once | INTRAMUSCULAR | Status: AC
  Administered 2012-12-20: 20 mL
  Filled 2012-12-20: qty 20

## 2012-12-20 MED ORDER — VANCOMYCIN HCL IN DEXTROSE 1-5 GM/200ML-% IV SOLN
1000.0000 mg | Freq: Two times a day (BID) | INTRAVENOUS | Status: AC
  Administered 2012-12-20: 1000 mg via INTRAVENOUS
  Filled 2012-12-20: qty 200

## 2012-12-20 MED ORDER — LOSARTAN POTASSIUM 50 MG PO TABS
50.0000 mg | ORAL_TABLET | Freq: Every day | ORAL | Status: DC
  Administered 2012-12-21: 50 mg via ORAL
  Filled 2012-12-20 (×4): qty 1

## 2012-12-20 MED ORDER — PANTOPRAZOLE SODIUM 40 MG PO TBEC
40.0000 mg | DELAYED_RELEASE_TABLET | Freq: Every day | ORAL | Status: DC
  Filled 2012-12-20: qty 1

## 2012-12-20 MED ORDER — METFORMIN HCL ER 500 MG PO TB24
1000.0000 mg | ORAL_TABLET | Freq: Every day | ORAL | Status: DC
  Filled 2012-12-20: qty 2

## 2012-12-20 MED ORDER — ALPRAZOLAM 0.25 MG PO TABS: 0.2500 mg | ORAL_TABLET | Freq: Every evening | ORAL | Status: DC | PRN

## 2012-12-20 MED ORDER — RIVAROXABAN 10 MG PO TABS
10.0000 mg | ORAL_TABLET | Freq: Every day | ORAL | Status: DC
  Administered 2012-12-21 – 2012-12-22 (×2): 10 mg via ORAL
  Filled 2012-12-20 (×3): qty 1

## 2012-12-20 MED ORDER — SODIUM CHLORIDE 0.9 % IV SOLN
INTRAVENOUS | Status: DC | PRN
  Administered 2012-12-20: 50 mL via INTRAMUSCULAR

## 2012-12-20 MED ORDER — ALPRAZOLAM 0.25 MG PO TABS
0.2500 mg | ORAL_TABLET | Freq: Every evening | ORAL | Status: DC | PRN
  Administered 2012-12-20 – 2012-12-21 (×2): 0.25 mg via ORAL
  Filled 2012-12-20 (×2): qty 1

## 2012-12-20 MED ORDER — ACETAMINOPHEN 10 MG/ML IV SOLN: 1000.0000 mg | Freq: Once | INTRAVENOUS | Status: DC

## 2012-12-20 MED ORDER — METFORMIN HCL ER 500 MG PO TB24
500.0000 mg | ORAL_TABLET | Freq: Every day | ORAL | Status: DC
  Filled 2012-12-20: qty 2

## 2012-12-20 MED ORDER — BISACODYL 10 MG RE SUPP: 10.0000 mg | Freq: Every day | RECTAL | Status: DC | PRN

## 2012-12-20 MED ORDER — HYDROMORPHONE HCL PF 1 MG/ML IJ SOLN
0.5000 mg | INTRAMUSCULAR | Status: DC | PRN
  Administered 2012-12-20: 1 mg via INTRAVENOUS
  Administered 2012-12-20: 0.5 mg via INTRAVENOUS
  Administered 2012-12-20 – 2012-12-21 (×2): 1 mg via INTRAVENOUS
  Filled 2012-12-20 (×4): qty 1

## 2012-12-20 MED ORDER — DOCUSATE SODIUM 100 MG PO CAPS
100.0000 mg | ORAL_CAPSULE | Freq: Two times a day (BID) | ORAL | Status: DC
  Administered 2012-12-20 – 2012-12-22 (×5): 100 mg via ORAL

## 2012-12-20 MED ORDER — MORPHINE SULFATE 2 MG/ML IJ SOLN
1.0000 mg | INTRAMUSCULAR | Status: DC | PRN
  Administered 2012-12-20 (×2): 1 mg via INTRAVENOUS
  Filled 2012-12-20 (×2): qty 1

## 2012-12-20 MED ORDER — FENTANYL CITRATE 0.05 MG/ML IJ SOLN
INTRAMUSCULAR | Status: DC | PRN
  Administered 2012-12-20 (×2): 50 ug via INTRAVENOUS

## 2012-12-20 MED ORDER — HYDROMORPHONE HCL PF 1 MG/ML IJ SOLN
0.2500 mg | INTRAMUSCULAR | Status: DC | PRN
  Administered 2012-12-20 (×2): 0.5 mg via INTRAVENOUS

## 2012-12-20 MED ORDER — SODIUM CHLORIDE 0.9 % IR SOLN
Status: DC | PRN
  Administered 2012-12-20: 200 mL

## 2012-12-20 MED ORDER — MIDAZOLAM HCL 5 MG/5ML IJ SOLN
INTRAMUSCULAR | Status: DC | PRN
  Administered 2012-12-20: 2 mg via INTRAVENOUS

## 2012-12-20 MED ORDER — CYCLOSPORINE 0.05 % OP EMUL
1.0000 [drp] | Freq: Every day | OPHTHALMIC | Status: DC
Start: 2012-12-21 — End: 2012-12-22
  Administered 2012-12-21 – 2012-12-22 (×2): 1 [drp] via OPHTHALMIC
  Filled 2012-12-20 (×2): qty 1

## 2012-12-20 MED ORDER — INSULIN ASPART 100 UNIT/ML ~~LOC~~ SOLN
0.0000 [IU] | Freq: Three times a day (TID) | SUBCUTANEOUS | Status: DC
  Administered 2012-12-22 (×2): 3 [IU] via SUBCUTANEOUS

## 2012-12-20 MED ORDER — TRAMADOL HCL 50 MG PO TABS: 50.0000 mg | ORAL_TABLET | Freq: Four times a day (QID) | ORAL | Status: DC | PRN

## 2012-12-20 MED ORDER — FLUTICASONE PROPIONATE 50 MCG/ACT NA SUSP
2.0000 | Freq: Every day | NASAL | Status: DC | PRN
  Filled 2012-12-20: qty 16

## 2012-12-20 MED ORDER — ONDANSETRON HCL 4 MG/2ML IJ SOLN
4.0000 mg | Freq: Four times a day (QID) | INTRAMUSCULAR | Status: DC | PRN
  Administered 2012-12-20 – 2012-12-21 (×3): 4 mg via INTRAVENOUS
  Filled 2012-12-20 (×3): qty 2

## 2012-12-20 MED ORDER — METOCLOPRAMIDE HCL 5 MG/ML IJ SOLN
5.0000 mg | Freq: Three times a day (TID) | INTRAMUSCULAR | Status: DC | PRN
  Administered 2012-12-21: 5 mg via INTRAVENOUS
  Filled 2012-12-20: qty 2

## 2012-12-20 MED ORDER — POLYETHYLENE GLYCOL 3350 17 G PO PACK: 17.0000 g | PACK | Freq: Every day | ORAL | Status: DC | PRN

## 2012-12-20 MED ORDER — OXYCODONE HCL 5 MG PO TABS
5.0000 mg | ORAL_TABLET | ORAL | Status: DC | PRN
  Administered 2012-12-20 – 2012-12-21 (×6): 10 mg via ORAL
  Administered 2012-12-21: 5 mg via ORAL
  Filled 2012-12-20 (×8): qty 2

## 2012-12-20 MED ORDER — METFORMIN HCL ER 500 MG PO TB24
500.0000 mg | ORAL_TABLET | Freq: Every day | ORAL | Status: DC
  Administered 2012-12-20: 500 mg via ORAL
  Filled 2012-12-20 (×2): qty 1

## 2012-12-20 MED ORDER — METFORMIN HCL ER 500 MG PO TB24
1000.0000 mg | ORAL_TABLET | Freq: Every day | ORAL | Status: DC
  Administered 2012-12-21: 1000 mg via ORAL
  Filled 2012-12-20 (×2): qty 2

## 2012-12-20 MED ORDER — MUPIROCIN 2 % EX OINT
TOPICAL_OINTMENT | Freq: Two times a day (BID) | CUTANEOUS | Status: DC
  Administered 2012-12-20: 1 via NASAL
  Filled 2012-12-20: qty 22

## 2012-12-20 MED ORDER — ANASTROZOLE 1 MG PO TABS
1.0000 mg | ORAL_TABLET | Freq: Every day | ORAL | Status: DC
  Administered 2012-12-20 – 2012-12-22 (×3): 1 mg via ORAL
  Filled 2012-12-20 (×4): qty 1

## 2012-12-20 MED ORDER — 0.9 % SODIUM CHLORIDE (POUR BTL) OPTIME
TOPICAL | Status: DC | PRN
  Administered 2012-12-20: 125 mL

## 2012-12-20 MED ORDER — SODIUM CHLORIDE 0.9 % IV BOLUS (SEPSIS)
250.0000 mL | Freq: Once | INTRAVENOUS | Status: AC
  Administered 2012-12-20: 250 mL via INTRAVENOUS

## 2012-12-20 MED ORDER — VANCOMYCIN HCL 10 G IV SOLR
1500.0000 mg | INTRAVENOUS | Status: AC
  Administered 2012-12-20: 1500 mg via INTRAVENOUS
  Filled 2012-12-20: qty 1500

## 2012-12-20 SURGICAL SUPPLY — 57 items
BAG ZIPLOCK 12X15 (MISCELLANEOUS) ×2 IMPLANT
BANDAGE ELASTIC 6 VELCRO ST LF (GAUZE/BANDAGES/DRESSINGS) ×2 IMPLANT
BANDAGE ESMARK 6X9 LF (GAUZE/BANDAGES/DRESSINGS) ×1 IMPLANT
BLADE SAG 18X100X1.27 (BLADE) ×2 IMPLANT
BLADE SAW SGTL 11.0X1.19X90.0M (BLADE) ×2 IMPLANT
BNDG ESMARK 6X9 LF (GAUZE/BANDAGES/DRESSINGS) ×2
BOWL SMART MIX CTS (DISPOSABLE) ×2 IMPLANT
CATH FOLEY LATEX FREE 16FR (CATHETERS) ×2 IMPLANT
CEMENT HV SMART SET (Cement) ×4 IMPLANT
CLOTH BEACON ORANGE TIMEOUT ST (SAFETY) ×2 IMPLANT
CUFF TOURN SGL QUICK 34 (TOURNIQUET CUFF) ×1
CUFF TRNQT CYL 34X4X40X1 (TOURNIQUET CUFF) ×1 IMPLANT
DRAPE EXTREMITY T 121X128X90 (DRAPE) ×2 IMPLANT
DRAPE POUCH INSTRU U-SHP 10X18 (DRAPES) ×2 IMPLANT
DRAPE U-SHAPE 47X51 STRL (DRAPES) ×2 IMPLANT
DRSG ADAPTIC 3X8 NADH LF (GAUZE/BANDAGES/DRESSINGS) ×2 IMPLANT
DURAPREP 26ML APPLICATOR (WOUND CARE) ×2 IMPLANT
ELECT REM PT RETURN 9FT ADLT (ELECTROSURGICAL) ×2
ELECTRODE REM PT RTRN 9FT ADLT (ELECTROSURGICAL) ×1 IMPLANT
EVACUATOR 1/8 PVC DRAIN (DRAIN) ×2 IMPLANT
FACESHIELD LNG OPTICON STERILE (SAFETY) ×10 IMPLANT
GLOVE BIO SURGEON STRL SZ7.5 (GLOVE) IMPLANT
GLOVE BIO SURGEON STRL SZ8 (GLOVE) IMPLANT
GLOVE BIOGEL PI IND STRL 7.0 (GLOVE) ×1 IMPLANT
GLOVE BIOGEL PI IND STRL 8 (GLOVE) ×1 IMPLANT
GLOVE BIOGEL PI INDICATOR 7.0 (GLOVE) ×1
GLOVE BIOGEL PI INDICATOR 8 (GLOVE) ×1
GLOVE SURG SS PI 6.5 STRL IVOR (GLOVE) ×6 IMPLANT
GLOVE SURG SS PI 8.0 STRL IVOR (GLOVE) ×2 IMPLANT
GOWN STRL NON-REIN LRG LVL3 (GOWN DISPOSABLE) ×4 IMPLANT
GOWN STRL REIN XL XLG (GOWN DISPOSABLE) ×2 IMPLANT
HANDPIECE INTERPULSE COAX TIP (DISPOSABLE) ×1
IMMOBILIZER KNEE 20 (SOFTGOODS) ×2
IMMOBILIZER KNEE 20 THIGH 36 (SOFTGOODS) ×1 IMPLANT
KIT BASIN OR (CUSTOM PROCEDURE TRAY) ×2 IMPLANT
MANIFOLD NEPTUNE II (INSTRUMENTS) ×2 IMPLANT
NDL SAFETY ECLIPSE 18X1.5 (NEEDLE) ×1 IMPLANT
NEEDLE HYPO 18GX1.5 SHARP (NEEDLE) ×1
NS IRRIG 1000ML POUR BTL (IV SOLUTION) ×2 IMPLANT
PACK TOTAL JOINT (CUSTOM PROCEDURE TRAY) ×2 IMPLANT
PAD ABD 7.5X8 STRL (GAUZE/BANDAGES/DRESSINGS) ×2 IMPLANT
PADDING CAST COTTON 6X4 STRL (CAST SUPPLIES) ×2 IMPLANT
POSITIONER SURGICAL ARM (MISCELLANEOUS) ×2 IMPLANT
SET HNDPC FAN SPRY TIP SCT (DISPOSABLE) ×1 IMPLANT
SPONGE GAUZE 4X4 12PLY (GAUZE/BANDAGES/DRESSINGS) ×2 IMPLANT
STRIP CLOSURE SKIN 1/2X4 (GAUZE/BANDAGES/DRESSINGS) ×2 IMPLANT
SUCTION FRAZIER 12FR DISP (SUCTIONS) ×2 IMPLANT
SUT MNCRL AB 4-0 PS2 18 (SUTURE) ×2 IMPLANT
SUT VIC AB 2-0 CT1 27 (SUTURE) ×3
SUT VIC AB 2-0 CT1 TAPERPNT 27 (SUTURE) ×3 IMPLANT
SUT VLOC 180 0 24IN GS25 (SUTURE) ×2 IMPLANT
SYR 50ML LL SCALE MARK (SYRINGE) ×2 IMPLANT
TOWEL OR 17X26 10 PK STRL BLUE (TOWEL DISPOSABLE) ×4 IMPLANT
TOWEL OR NON WOVEN STRL DISP B (DISPOSABLE) ×2 IMPLANT
TRAY FOLEY CATH 14FRSI W/METER (CATHETERS) IMPLANT
WATER STERILE IRR 1500ML POUR (IV SOLUTION) ×2 IMPLANT
WRAP KNEE MAXI GEL POST OP (GAUZE/BANDAGES/DRESSINGS) ×4 IMPLANT

## 2012-12-20 NOTE — Interval H&P Note (Signed)
History and Physical Interval Note:  12/20/2012 10:35 AM  Chelsey [name]  has presented today for surgery, with the diagnosis of OSTEOARTHRITIS LEFT KNEE  The various methods of treatment have been discussed with the patient and family. After consideration of risks, benefits and other options for treatment, the patient has consented to  Procedure(s): TOTAL KNEE ARTHROPLASTY (Left) as a surgical intervention .  The patient's history has been reviewed, patient examined, no change in status, stable for surgery.  I have reviewed the patient's chart and labs.  Questions were answered to the patient's satisfaction.     Loanne Drilling

## 2012-12-20 NOTE — Transfer of Care (Signed)
Immediate Anesthesia Transfer of Care Note  Patient: Chelsey [name]  Procedure(s) Performed: Procedure(s): TOTAL KNEE ARTHROPLASTY (Left)  Patient Location: PACU  Anesthesia Type:Regional  Level of Consciousness: awake, alert , oriented and patient cooperative  Airway & Oxygen Therapy: Patient Spontanous Breathing and Patient connected to face mask oxygen  Post-op Assessment: Report given to PACU RN and Post -op Vital signs reviewed and stable  Post vital signs: stable  Complications: No apparent anesthesia complications

## 2012-12-20 NOTE — H&P (View-Only) (Signed)
Chelsey [name]  DOB: 18-Feb-1946 Married / Language: English / Race: White Female  Date of Admission:  12/20/2012  Chief Complaint:  Left Knee Pain  History of Present Illness The patient is a 67 year old female who comes in for a preoperative History and Physical. The patient is scheduled for a left total knee arthroplasty to be performed by Dr. Gus Rankin. Aluisio, MD at [facility] on March 10. 2014. The patient is a 67 year old female who presents with knee complaints. The patient reports left knee symptoms including: pain and stiffness which began 20 year(s) ago without any known injury.The patient feels that the symptoms are worsening. Previous work-up for this problem has included knee x-rays. Symptoms are exacerbated by weight bearing. She had had multiple health issues in the past few years most significantly breast cancer. Fortunately she is currently in remission. She just saw Dr. Donnie Coffin recently and got a good report. Her biggest problem now is her knees. She had knee problems when we saw her in 2010 and was going to contemplate knee replacement at that surgery but the other health issues arose and took precedence. She feels as though the left knee is bothering her more than the right. It is hurting at all times. It is limiting what she can and can not do. She does have pain at night if she has had a very busy day. 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 She is ready to proceed with knee replacement so that she can become acitve again. They have been treated conservatively in the past for the above stated problem and despite conservative measures, they continue to have progressive pain and severe functional limitations and dysfunction. They have failed non-operative management including home exercise, medications, and injections. It is felt that they would benefit from undergoing total joint replacement. Risks and benefits of the procedure have been discussed with the patient and they  elect to proceed with surgery. There are no active contraindications to surgery such as ongoing infection or rapidly progressive neurological disease.   Allergies Penicillin G Sodium *PENICILLINS*. Childhood RXN Latex. Rash.   Family History Diabetes Mellitus. mother Hypertension. mother   Social History Alcohol use. current drinker; drinks wine; 5-7 per week Children. 2 Current work status. working full time Drug/Alcohol Rehab (Currently). no Drug/Alcohol Rehab (Previously). no Exercise. Exercises never Illicit drug use. no Living situation. live with spouse Marital status. married Number of flights of stairs before winded. 2-3 Pain Contract. no Tobacco / smoke exposure. no Tobacco use. never smoker   Medication History Vitamin D ( Oral) Specific dose unknown - Active. Arimidex (1MG  Tablet, Oral) Active. Crestor (10MG  Tablet, Oral) Active. Hydrocodone-Acetaminophen (5-325MG  Tablet, Oral) Active. MetFORMIN HCl (500MG  Tablet ER 24HR, Oral) Active. NexIUM (40MG  Capsule DR, Oral) Active. PredniSONE (10MG  Tablet, Oral) Active. Restasis (0.05% Emulsion, Ophthalmic) Active. Sertraline HCl (50MG  Tablet, Oral) Active. Losartan Potassium (50MG  Tablet, Oral) Active.   Past Surgical History Breast Reconstruction. left Cesarean Delivery. 3 or more times Colon Polyp Removal - Colonoscopy Mammoplasty; Reduction. right Mastectomy. left  Medical History Anxiety Disorder Breast Cancer Diabetes Mellitus, Type II Gastroesophageal Reflux Disease High blood pressure Hypercholesterolemia   Review of Systems General:Not Present- Chills, Fever, Night Sweats, Fatigue, Weight Gain, Weight Loss and Memory Loss. Skin:Not Present- Hives, Itching, Rash, Eczema and Lesions. HEENT:Not Present- Tinnitus, Headache, Double Vision, Visual Loss, Hearing Loss and Dentures. Respiratory:Not Present- Shortness of breath with exertion, Shortness of breath at rest,  Allergies, Coughing up blood and Chronic Cough.  Cardiovascular:Not Present- Chest Pain, Racing/skipping heartbeats, Difficulty Breathing Lying Down, Murmur, Swelling and Palpitations. Gastrointestinal:Not Present- Bloody Stool, Heartburn, Abdominal Pain, Vomiting, Nausea, Constipation, Diarrhea, Difficulty Swallowing, Jaundice and Loss of appetitie. Female Genitourinary:Not Present- Blood in Urine, Urinary frequency, Weak urinary stream, Discharge, Flank Pain, Incontinence, Painful Urination, Urgency, Urinary Retention and Urinating at Night. Musculoskeletal:Present- Joint Pain. Not Present- Muscle Weakness, Muscle Pain, Joint Swelling, Back Pain, Morning Stiffness and Spasms. Neurological:Not Present- Tremor, Dizziness, Blackout spells, Paralysis, Difficulty with balance and Weakness. Psychiatric:Not Present- Insomnia.   Vitals Weight: 200 lb Height: 63.5 in Body Surface Area: 2.02 m Body Mass Index: 34.87 kg/m Pulse: 64 (Regular) Resp.: 16 (Unlabored) BP: 132/74 (Sitting, Right Arm, Standard)    Physical Exam The physical exam findings are as follows:  Note: Patient is a 67 year old female with continued knee pain.   General Mental Status - Alert, cooperative and good historian. General Appearance- pleasant. Not in acute distress. Orientation- Oriented X3. Build & Nutrition- Well nourished and Well developed.   Head and Neck Head- normocephalic, atraumatic . Neck Global Assessment- supple. no bruit auscultated on the right and no bruit auscultated on the left.   Eye Pupil- Bilateral- Regular and Round. Motion- Bilateral- EOMI.   Chest and Lung Exam Auscultation: Breath sounds:- clear at anterior chest wall and - clear at posterior chest wall. Adventitious sounds:- No Adventitious sounds.   Cardiovascular Auscultation:Rhythm- Regular rate and rhythm. Heart Sounds- S1 WNL and S2 WNL. Murmurs & Other Heart Sounds:Auscultation  of the heart reveals - No Murmurs.   Abdomen Palpation/Percussion:Tenderness- Abdomen is non-tender to palpation. Rigidity (guarding)- Abdomen is soft. Auscultation:Auscultation of the abdomen reveals - Bowel sounds normal.   Female Genitourinary  Not done, not pertinent to present illness  Musculoskeletal Upper Extremity Left Upper Extremity: General:Neurovascular Status- Neurovascularly intact throughout. Note: She does have lymphedema of the left arm. NEEDS TO AVOID BP AND LABS IN THE LEFT ARM. On exam well developed female alert and oriented in no apparent distress. Both hips have normal range of motion with no discomfort. Her left knee shows varus deformity, range 10 to 120, marked crepitus on range of motion, tenderness medial greater than lateral with no instability noted. Right knee no effusion, slight varus, range 0 to 120, moderate crepitus on range of motion, tender medial greater than lateral with no instability. Pulses, sensation and motor are intact both lower extremities.  RADIOGRAPHS: AP both knees and lateral show that she has advanced endstage arthritis both knees medial and patellofemoral compartments with varus deformity worse on the left than the right.  Assessment & Plan Primary osteoarthritis of one knee (715.16) Impression: Left Knee  Note: Plan is for a Left Total Knee Replacement by Dr. Lequita Halt.  Plan is to go home following the surgery.  PCP - Dr. Fabian Sharp - Patient has been seen preoperatively and felt to be stable for surgery as per surgery. NEEDS TO AVOID BP AND LABS IN THE LEFT ARM.  Signed electronically by Roberts Gaudy, PA-C

## 2012-12-20 NOTE — Anesthesia Preprocedure Evaluation (Signed)
Anesthesia Evaluation  Patient identified by MRN, date of birth, ID band Patient awake    Reviewed: Allergy & Precautions, H&P , NPO status , Patient's Chart, lab work & pertinent test results  Airway Mallampati: III TM Distance: >3 FB Neck ROM: Full    Dental  (+) Teeth Intact and Dental Advisory Given   Pulmonary neg pulmonary ROS, sleep apnea , pneumonia -,  breath sounds clear to auscultation  Pulmonary exam normal       Cardiovascular hypertension, Pt. on medications Rhythm:Regular Rate:Normal     Neuro/Psych negative neurological ROS  negative psych ROS   GI/Hepatic Neg liver ROS, GERD-  Medicated,  Endo/Other  diabetes, Type 2  Renal/GU negative Renal ROS  negative genitourinary   Musculoskeletal negative musculoskeletal ROS (+)   Abdominal   Peds negative pediatric ROS (+)  Hematology negative hematology ROS (+)   Anesthesia Other Findings   Reproductive/Obstetrics negative OB ROS Prior left mastectomy with recostruction                           Anesthesia Physical Anesthesia Plan  ASA: II  Anesthesia Plan: Spinal   Post-op Pain Management:    Induction: Intravenous  Airway Management Planned: Simple Face Mask  Additional Equipment:   Intra-op Plan:   Post-operative Plan: Extubation in OR  Informed Consent: I have reviewed the patients History and Physical, chart, labs and discussed the procedure including the risks, benefits and alternatives for the proposed anesthesia with the patient or authorized representative who has indicated his/her understanding and acceptance.   Dental advisory given  Plan Discussed with: CRNA  Anesthesia Plan Comments:         Anesthesia Quick Evaluation

## 2012-12-20 NOTE — Op Note (Signed)
Pre-operative diagnosis- Osteoarthritis  Left knee(s)  Post-operative diagnosis- Osteoarthritis Left knee(s)  Procedure-  Left  Total Knee Arthroplasty  Surgeon- Gus Rankin. Aluisio, MD  Assistant- Dimitri Ped, PA-C   Anesthesia-  Spinal EBL-* No blood loss amount entered *  Drains Hemovac  Tourniquet time- 32 minutes @ 300 mm Hg Complications- None  Condition-PACU - hemodynamically stable.   Brief Clinical Note  Chelsey [name] is a 67 y.o. year old female with end stage OA of her left knee with progressively worsening pain and dysfunction. She has constant pain, with activity and at rest and significant functional deficits with difficulties even with ADLs. She has had extensive non-op management including analgesics, injections of cortisone and viscosupplements, and home exercise program, but remains in significant pain with significant dysfunction. Radiographs show bone on bone arthritis medial and patellofemoral with varus deformity. She presents now for left Total Knee Arthroplasty.    Procedure in detail---   The patient is brought into the operating room and positioned supine on the operating table. After successful administration of  Spinal,   a tourniquet is placed high on the  Left thigh(s) and the lower extremity is prepped and draped in the usual sterile fashion. Time out is performed by the operating team and then the  Left lower extremity is wrapped in Esmarch, knee flexed and the tourniquet inflated to 300 mmHg.       A midline incision is made with a ten blade through the subcutaneous tissue to the level of the extensor mechanism. A fresh blade is used to make a medial parapatellar arthrotomy. Soft tissue over the proximal medial tibia is subperiosteally elevated to the joint line with a knife and into the semimembranosus bursa with a Cobb elevator. Soft tissue over the proximal lateral tibia is elevated with attention being paid to avoiding the patellar tendon on the tibial  tubercle. The patella is everted, knee flexed 90 degrees and the ACL and PCL are removed. Findings are bone on bone medial and patellofemoral with large medial and patellar osteophytes.        The drill is used to create a starting hole in the distal femur and the canal is thoroughly irrigated with sterile saline to remove the fatty contents. The 5 degree Left  valgus alignment guide is placed into the femoral canal and the distal femoral cutting block is pinned to remove 10 mm off the distal femur. Resection is made with an oscillating saw.      The tibia is subluxed forward and the menisci are removed. The extramedullary alignment guide is placed referencing proximally at the medial aspect of the tibial tubercle and distally along the second metatarsal axis and tibial crest. The block is pinned to remove 2mm off the more deficient medial  side. Resection is made with an oscillating saw. Size 2.5is the most appropriate size for the tibia and the proximal tibia is prepared with the modular drill and keel punch for that size.      The femoral sizing guide is placed and size 3 is most appropriate. Rotation is marked off the epicondylar axis and confirmed by creating a rectangular flexion gap at 90 degrees. The size 3 cutting block is pinned in this rotation and the anterior, posterior and chamfer cuts are made with the oscillating saw. The intercondylar block is then placed and that cut is made.      Trial size 2.5 tibial component, trial size 3 posterior stabilized femur and a 10  mm  posterior stabilized rotating platform insert trial is placed. Full extension is achieved with excellent varus/valgus and anterior/posterior balance throughout full range of motion. The patella is everted and thickness measured to be 22  mm. Free hand resection is taken to 12 mm, a 35 template is placed, lug holes are drilled, trial patella is placed, and it tracks normally. Osteophytes are removed off the posterior femur with the  trial in place. All trials are removed and the cut bone surfaces prepared with pulsatile lavage. Cement is mixed and once ready for implantation, the size 2.5 tibial implant, size  3 posterior stabilized femoral component, and the size 35 patella are cemented in place and the patella is held with the clamp. The trial insert is placed and the knee held in full extension. The Exparel (20 ml mixed with 50 ml saline) is injected into the extensor mechanism, posterior capsule, medial and lateral gutters and subcutaneous tissues.  All extruded cement is removed and once the cement is hard the permanent 10 mm posterior stabilized rotating platform insert is placed into the tibial tray.      The wound is copiously irrigated with saline solution and the extensor mechanism closed over a hemovac drain with #1 PDS suture. The tourniquet is released for a total tourniquet time of 32  minutes. Flexion against gravity is 135 degrees and the patella tracks normally. Subcutaneous tissue is closed with 2.0 vicryl and subcuticular with running 4.0 Monocryl. The incision is cleaned and dried and steri-strips and a bulky sterile dressing are applied. The limb is placed into a knee immobilizer and the patient is awakened and transported to recovery in stable condition.      Please note that a surgical assistant was a medical necessity for this procedure in order to perform it in a safe and expeditious manner. Surgical assistant was necessary to retract the ligaments and vital neurovascular structures to prevent injury to them and also necessary for proper positioning of the limb to allow for anatomic placement of the prosthesis.   Gus Rankin Aluisio, MD    12/20/2012, 12:39 PM

## 2012-12-20 NOTE — Anesthesia Procedure Notes (Signed)
Spinal  Patient location during procedure: OR Start time: 12/20/2012 11:30 AM End time: 12/20/2012 11:35 AM Staffing Anesthesiologist: Lucille Passy F Performed by: anesthesiologist  Preanesthetic Checklist Completed: patient identified, site marked, surgical consent, pre-op evaluation, timeout performed, IV checked, risks and benefits discussed and monitors and equipment checked Spinal Block Patient position: sitting Prep: Betadine Patient monitoring: heart rate, continuous pulse ox and blood pressure Approach: midline Location: L3-4 Injection technique: single-shot Needle Needle type: Spinocan  Needle gauge: 22 G Needle length: 9 cm Additional Notes Expiration date of kit checked and confirmed. Patient tolerated procedure well, without complications. Negative heme/paresthesia Lot [id number] DOE 09/2013

## 2012-12-20 NOTE — Anesthesia Postprocedure Evaluation (Signed)
Anesthesia Post Note  Patient: Chelsey [name]  Procedure(s) Performed: Procedure(s) (LRB): TOTAL KNEE ARTHROPLASTY (Left)  Anesthesia type: Spinal  Patient location: PACU  Post pain: Pain level controlled  Post assessment: Post-op Vital signs reviewed  Last Vitals:  Filed Vitals:   12/20/12 1400  BP:   Pulse: 61  Temp:   Resp: 16    Post vital signs: Reviewed  Level of consciousness: sedated  Complications: No apparent anesthesia complications

## 2012-12-20 NOTE — Care Management (Signed)
[name] is following this pt. Chelsey [name] [phone]

## 2012-12-20 NOTE — Progress Notes (Signed)
Utilization review completed.  

## 2012-12-20 NOTE — Progress Notes (Signed)
RT set patient up on CPAP. Sterile water added to fill line. CPAP set to 10cm H2O per patient home settings. Patient has home nasal mask and tubing. Husband at bedside. She states she is comfortable, RT encouraged both to call if they needed any further assistance. RN aware.

## 2012-12-21 ENCOUNTER — Encounter (HOSPITAL_COMMUNITY): Payer: Self-pay | Admitting: Orthopedic Surgery

## 2012-12-21 DIAGNOSIS — E871 Hypo-osmolality and hyponatremia: Secondary | ICD-10-CM | POA: Diagnosis not present

## 2012-12-21 LAB — CBC
HCT: 29.4 % — ABNORMAL LOW (ref 36.0–46.0)
MCH: 31.1 pg (ref 26.0–34.0)
MCV: 88 fL (ref 78.0–100.0)
RBC: 3.34 MIL/uL — ABNORMAL LOW (ref 3.87–5.11)
RDW: 13.2 % (ref 11.5–15.5)
WBC: 9.1 10*3/uL (ref 4.0–10.5)

## 2012-12-21 LAB — BASIC METABOLIC PANEL
CO2: 22 mEq/L (ref 19–32)
Calcium: 8.4 mg/dL (ref 8.4–10.5)
Chloride: 96 mEq/L (ref 96–112)
Creatinine, Ser: 0.49 mg/dL — ABNORMAL LOW (ref 0.50–1.10)
Glucose, Bld: 162 mg/dL — ABNORMAL HIGH (ref 70–99)

## 2012-12-21 LAB — GLUCOSE, CAPILLARY
Glucose-Capillary: 201 mg/dL — ABNORMAL HIGH (ref 70–99)
Glucose-Capillary: 155 mg/dL — ABNORMAL HIGH (ref 70–99)
Glucose-Capillary: 153 mg/dL — ABNORMAL HIGH (ref 70–99)
Glucose-Capillary: 170 mg/dL — ABNORMAL HIGH (ref 70–99)

## 2012-12-21 MED ORDER — NON FORMULARY: 20.0000 mg | Freq: Every day | Status: DC

## 2012-12-21 MED ORDER — SODIUM CHLORIDE 0.9 % IV BOLUS (SEPSIS)
500.0000 mL | Freq: Once | INTRAVENOUS | Status: AC
  Administered 2012-12-21: 09:00:00 via INTRAVENOUS

## 2012-12-21 MED ORDER — PROMETHAZINE HCL 25 MG/ML IJ SOLN
12.5000 mg | INTRAMUSCULAR | Status: DC | PRN
  Administered 2012-12-21: 12.5 mg via INTRAVENOUS
  Filled 2012-12-21: qty 1

## 2012-12-21 MED ORDER — ALUM & MAG HYDROXIDE-SIMETH 200-200-20 MG/5ML PO SUSP
30.0000 mL | Freq: Four times a day (QID) | ORAL | Status: DC | PRN
  Filled 2012-12-21: qty 30

## 2012-12-21 MED ORDER — OMEPRAZOLE 20 MG PO CPDR
20.0000 mg | DELAYED_RELEASE_CAPSULE | Freq: Every day | ORAL | Status: DC
  Administered 2012-12-21 – 2012-12-22 (×2): 20 mg via ORAL
  Filled 2012-12-21 (×2): qty 1

## 2012-12-21 MED ORDER — HYDROMORPHONE HCL 2 MG PO TABS
2.0000 mg | ORAL_TABLET | ORAL | Status: DC | PRN
Start: 2012-12-21 — End: 2012-12-22
  Administered 2012-12-21: 2 mg via ORAL
  Administered 2012-12-21: 4 mg via ORAL
  Administered 2012-12-21: 2 mg via ORAL
  Administered 2012-12-22 (×2): 4 mg via ORAL
  Filled 2012-12-21: qty 2
  Filled 2012-12-21 (×2): qty 1
  Filled 2012-12-21 (×2): qty 2

## 2012-12-21 NOTE — Progress Notes (Signed)
Physical Therapy Treatment Patient Details Name: Chelsey [name] MRN: [id number] DOB: 1946/04/16 Today's Date: 12/21/2012 Time: 1415-1440 PT Time Calculation (min): 25 min  PT Assessment / Plan / Recommendation Comments on Treatment Session  Progression limited by nausea/vomiting/pain. Plan is tentatively for home tomorrow. Recommend HHPT.     Follow Up Recommendations  Home health PT     Does the patient have the potential to tolerate intense rehabilitation     Barriers to Discharge        Equipment Recommendations  Rolling walker with 5" wheels    Recommendations for Other Services OT consult  Frequency 7X/week   Plan Discharge plan remains appropriate    Precautions / Restrictions Precautions Precautions: Fall;Knee Required Braces or Orthoses: Knee Immobilizer - Left Knee Immobilizer - Left: Discontinue once straight leg raise with < 10 degree lag Restrictions Weight Bearing Restrictions: No LLE Weight Bearing: Weight bearing as tolerated   Pertinent Vitals/Pain 8/10 L knee    Mobility  Bed Mobility Bed Mobility: Supine to Sit;Sit to Supine Supine to Sit: 4: Min assist Sit to Supine: 4: Min assist Details for Bed Mobility Assistance: assist for L LE off/onto bed.  Transfers Transfers: Sit to Stand;Stand to Sit Sit to Stand: 4: Min assist;From bed Stand to Sit: 4: Min assist;To bed Details for Transfer Assistance: VCs safety, technique, hand placement. Assist to rise, stabilize, control descent Ambulation/Gait Ambulation/Gait Assistance: 4: Min assist Ambulation Distance (Feet): 45 Feet Assistive device: Rolling walker Ambulation/Gait Assistance Details: VCs safety, sequence, step length. Assist to stabilize throughout ambulation. Vomited on KI earlier so ambulated without it-pt tolerated fine.  Gait Pattern: Step-to pattern;Antalgic;Decreased stride length;Decreased step length - left    Exercises Total Joint Exercises Ankle Circles/Pumps: AROM;Both;10  reps;Supine Quad Sets: AROM;Both;10 reps;Supine Short Arc Quad: AAROM;Left;10 reps;Supine Heel Slides: AAROM;Left;10 reps;Supine Hip ABduction/ADduction: AAROM;Left;10 reps;Supine Straight Leg Raises: AAROM;Left;10 reps;Supine   PT Diagnosis:    PT Problem List:   PT Treatment Interventions:     PT Goals Acute Rehab PT Goals Pt will go Supine/Side to Sit: with supervision PT Goal: Supine/Side to Sit - Progress: Progressing toward goal Pt will go Sit to Supine/Side: with supervision PT Goal: Sit to Supine/Side - Progress: Progressing toward goal Pt will go Sit to Stand: with supervision PT Goal: Sit to Stand - Progress: Progressing toward goal Pt will Ambulate: 51 - 150 feet;with supervision;with rolling walker PT Goal: Ambulate - Progress: Progressing toward goal Pt will Perform Home Exercise Program: with supervision, verbal cues required/provided PT Goal: Perform Home Exercise Program - Progress: Progressing toward goal  Visit Information  Last PT Received On: 12/21/12 Assistance Needed: +1    Subjective Data  Subjective: I can't keep anything down Patient Stated Goal: home   Cognition  Cognition Overall Cognitive Status: Appears within functional limits for tasks assessed/performed Arousal/Alertness: Awake/alert Orientation Level: Appears intact for tasks assessed Behavior During Session: [facility] for tasks performed    Balance     End of Session PT - End of Session Equipment Utilized During Treatment: Gait belt;Left knee immobilizer Activity Tolerance: Patient limited by pain Patient left: in bed;with call bell/phone within reach;with family/visitor present CPM Left Knee CPM Left Knee: On   GP     Rebeca Alert, MPT Pager: [phone]

## 2012-12-21 NOTE — Clinical Documentation Improvement (Signed)
GENERIC DOCUMENTATION CLARIFICATION QUERY  THIS DOCUMENT IS NOT A PERMANENT PART OF THE MEDICAL RECORD  TO RESPOND TO THE THIS QUERY, FOLLOW THE INSTRUCTIONS BELOW:  1. If needed, update documentation for the patient's encounter via the notes activity.  2. Access this query again and click edit on the In [employer].  3. After updating, or not, click F2 to complete all highlighted (required) fields concerning your review. Select "additional documentation in the medical record" OR "no additional documentation provided".  4. Click Sign note button.  5. The deficiency will fall out of your In Basket *Please let us know if you are not able to complete this workflow by phone or e-mail (listed below).  Please update your documentation within the medical record to reflect your response to this query.                                                                                        12/21/12   Dear Chelsey [name] / Associates,  In a better effort to capture your patient's severity of illness, reflect appropriate length of stay and utilization of resources, a review of the patient medical record has revealed the following indicators.    Based on your clinical judgment, please clarify and document in a progress note and/or discharge summary the clinical condition associated with the following supporting information:  In responding to this query please exercise your independent judgment.  The fact that a query is asked, does not imply that any particular answer is desired or expected.   Possible Clinical Conditions?  _______Hyponatremia   _______Other Condition  _______Cannot Clinically Determine     Supporting Information:  Diagnostics: 3/11: sodium: 130 3/05: sodium: 141  Treatment: 3/10:  0.9% bolus 0.9% NaCl 3/10:  0.9% NaCl @ 70ml/hr 3/11:  0.9% NaCl bolus 3/11:  0.9% NaCl @20ml /hr   You may use possible, probable, or suspect with inpatient documentation.  possible, probable, suspected diagnoses MUST be documented at the time of discharge  Reviewed: Post-op hyponatremia documented per 12/21/12 progress notes.                                     Thank You,  Marciano Sequin,  Clinical Documentation Specialist:  Pager: [phone]  Phone: [phone]  Health Information Management [place]

## 2012-12-21 NOTE — Evaluation (Addendum)
Physical Therapy Evaluation Patient Details Name: Chelsey [name] MRN: [id number] DOB: 08-28-1946 Today's Date: 12/21/2012 Time: 3086-5784 PT Time Calculation (min): 20 min  PT Assessment / Plan / Recommendation Clinical Impression  67 yo female s/p L TKA. On eval pt required Min assist for mobility. Pt was able to ambulate~45 feet with RW. Recommend HHPT, RW.     PT Assessment  Patient needs continued PT services    Follow Up Recommendations  Home health PT    Does the patient have the potential to tolerate intense rehabilitation      Barriers to Discharge        Equipment Recommendations  Rolling walker with 5" wheels    Recommendations for Other Services OT consult   Frequency 7X/week    Precautions / Restrictions Precautions Precautions: Fall;Knee Required Braces or Orthoses: Knee Immobilizer - Left Knee Immobilizer - Left: Discontinue once straight leg raise with < 10 degree lag Restrictions Weight Bearing Restrictions: No LLE Weight Bearing: Weight bearing as tolerated   Pertinent Vitals/Pain 5/10 L knee      Mobility  Bed Mobility Bed Mobility: Supine to Sit Supine to Sit: 4: Min assist Details for Bed Mobility Assistance: assist for L LE off bed.  Transfers Transfers: Sit to Stand;Stand to Sit Sit to Stand: 4: Min assist;From bed Stand to Sit: 4: Min assist;To chair/3-in-1 Details for Transfer Assistance: VCs safety, technique, hand placement. Assist to rise, stabilize, control descent Ambulation/Gait Ambulation/Gait Assistance: 4: Min assist Ambulation Distance (Feet): 45 Feet Assistive device: Rolling walker Ambulation/Gait Assistance Details: VCs safety, sequence, step length. Assist to stabilize throughout ambulation Gait Pattern: Step-to pattern;Antalgic;Decreased stride length;Decreased step length - left    Exercises     PT Diagnosis: Difficulty walking;Abnormality of gait;Acute pain  PT Problem List: Decreased strength;Decreased range of  motion;Decreased activity tolerance;Decreased mobility;Pain;Decreased knowledge of use of DME;Decreased knowledge of precautions PT Treatment Interventions: DME instruction;Gait training;Stair training;Functional mobility training;Therapeutic activities;Therapeutic exercise;Patient/family education   PT Goals Acute Rehab PT Goals PT Goal Formulation: With patient Time For Goal Achievement: 12/28/12 Potential to Achieve Goals: Good Pt will go Supine/Side to Sit: with supervision PT Goal: Supine/Side to Sit - Progress: Goal set today Pt will go Sit to Supine/Side: with supervision PT Goal: Sit to Supine/Side - Progress: Goal set today Pt will go Sit to Stand: with supervision PT Goal: Sit to Stand - Progress: Goal set today Pt will Ambulate: 51 - 150 feet;with supervision;with rolling walker PT Goal: Ambulate - Progress: Goal set today Pt will Go Up / Down Stairs: 1-2 stairs;with least restrictive assistive device PT Goal: Up/Down Stairs - Progress: Goal set today Pt will Perform Home Exercise Program: with supervision, verbal cues required/provided PT Goal: Perform Home Exercise Program - Progress: Goal set today  Visit Information  Last PT Received On: 12/21/12 Assistance Needed: +1    Subjective Data  Subjective: I keep falling asleep Patient Stated Goal: home   Prior Functioning  Home Living Lives With: Spouse Type of Home: House Home Access: Stairs to enter Entrance Stairs-Rails: None Home Layout: Two level;Able to live on main level with bedroom/bathroom Bathroom Shower/Tub: Engineer, manufacturing systems: Standard Home Adaptive Equipment: Tub transfer bench Prior Function Level of Independence: Independent Able to Take Stairs?: Yes Driving: Yes Communication Communication: No difficulties    Cognition  Cognition Overall Cognitive Status: Appears within functional limits for tasks assessed/performed Arousal/Alertness: Awake/alert Orientation Level: Appears  intact for tasks assessed Behavior During Session: [facility] for tasks performed    Extremity/Trunk  Assessment Right Lower Extremity Assessment RLE ROM/Strength/Tone: [facility] for tasks assessed Left Lower Extremity Assessment LLE ROM/Strength/Tone: Deficits LLE ROM/Strength/Tone Deficits: hip flex 2/5, hip abd/add 2/5, moves ankle well Trunk Assessment Trunk Assessment: Normal   Balance    End of Session PT - End of Session Equipment Utilized During Treatment: Gait belt;Left knee immobilizer Activity Tolerance: Patient tolerated treatment well Patient left: in chair;with call bell/phone within reach;with family/visitor present  GP     Rebeca Alert, MPT Pager: [phone]

## 2012-12-21 NOTE — Progress Notes (Signed)
Received orders for rolling walker and commode.  Equipment has been delivered to the hospital room.

## 2012-12-21 NOTE — Care Management Note (Signed)
    Page 1 of 2   12/21/2012     5:59:11 PM   CARE MANAGEMENT NOTE 12/21/2012  Patient:  Chelsey [name], Chelsey [name]   Account Number:  [id number]  Date Initiated:  12/21/2012  Documentation initiated by:  Colleen Can  Subjective/Objective Assessment:   dx total knee replacemnt    Pre-arranged with [name] to provide [facility] services. HHPt to start day after discharge.     Action/Plan:   CM spoke with patient and spouse. Plans are for patient to return to her home in [place] where spouse will be caregiver. DME has been del to pt  's room. [name] will provide [facility] services   Anticipated DC Date:  12/22/2012   Anticipated DC Plan:  HOME W HOME HEALTH SERVICES      DC Planning Services  CM consult      PAC Choice  DURABLE MEDICAL EQUIPMENT  HOME HEALTH   Choice offered to / List presented to:  C-1 Patient   DME arranged  3-N-1  Levan Hurst      DME agency  [facility]     HH arranged  HH-2 PT      [facility] agency  [facility]   Status of service:  Completed, signed off Medicare Important Message given?  NA - LOS <3 / Initial given by admissions (If response is "NO", the following Medicare IM given date fields will be blank) Date Medicare IM given:   Date Additional Medicare IM given:    Discharge Disposition:    Per UR Regulation:    If discussed at Long Length of Stay Meetings, dates discussed:    Comments:

## 2012-12-21 NOTE — Progress Notes (Addendum)
   Subjective: 1 Day Post-Op Procedure(s) (LRB): TOTAL KNEE ARTHROPLASTY (Left) Patient reports pain as moderate.   Patient seen in rounds with Dr. Lequita Halt. Family in room at bedside. Patient is having problems with pain in the knee, requiring pain medications We will start therapy today.  Plan is to go Home after hospital stay.  Objective: Vital signs in last 24 hours: Temp:  [97.3 F (36.3 C)-99.1 F (37.3 C)] 98.7 F (37.1 C) (03/11 0655) Pulse Rate:  [54-84] 75 (03/11 0655) Resp:  [15-18] 16 (03/11 0655) BP: (92-137)/(49-79) 118/73 mmHg (03/11 0655) SpO2:  [96 %-100 %] 96 % (03/11 0655) Weight:  [92.987 kg (205 lb)] 92.987 kg (205 lb) (03/10 1415)  Intake/Output from previous day:  Intake/Output Summary (Last 24 hours) at 12/21/12 0829 Last data filed at 12/21/12 0700  Gross per 24 hour  Intake 2698.42 ml  Output   2825 ml  Net -126.58 ml    Intake/Output this shift: UOP 550 since MN -126  Labs:  Recent Labs  12/21/12 0645  HGB 10.4*    Recent Labs  12/21/12 0645  WBC 9.1  RBC 3.34*  HCT 29.4*  PLT 163    Recent Labs  12/21/12 0456  NA 130*  K 4.0  CL 96  CO2 22  BUN 8  CREATININE 0.49*  GLUCOSE 162*  CALCIUM 8.4   No results found for this basename: LABPT, INR,  in the last 72 hours  EXAM General - Patient is Alert, Appropriate and Oriented Extremity - Neurovascular intact Sensation intact distally Dorsiflexion/Plantar flexion intact Dressing - dressing C/D/I Motor Function - intact, moving foot and toes well on exam.  Hemovac pulled without difficulty.  Past Medical History  Diagnosis Date  . Diabetes mellitus   . Ulcer   . Allergy   . Arthritis     osteoarthritis  . Hypertension   . Hyperlipidemia   . GERD (gastroesophageal reflux disease)   . Pneumonia 07/28/2011  . Sleep apnea     setting of 10  . Cancer     breast    Assessment/Plan: 1 Day Post-Op Procedure(s) (LRB): TOTAL KNEE ARTHROPLASTY (Left) Principal  Problem:   OA (osteoarthritis) of knee  Estimated body mass index is 35.17 kg/(m^2) as calculated from the following:   Height as of this encounter: 5\' 4"  (1.626 m).   Weight as of this encounter: 92.987 kg (205 lb). Advance diet Up with therapy Plan for discharge tomorrow Discharge home with home health Additional fluids this morning.  Negative on fluids I&O's. Blood pressure was soft thru the night. Cozaar on parameters.  DVT Prophylaxis - Xarelto Weight-Bearing as tolerated to left leg No vaccines. D/C O2 and Pulse OX and try on Room Air  Patrica Duel 12/21/2012, 8:29 AM  Patient has post-op hyponatremia. Will decrease IV fluids and recheck in AM

## 2012-12-21 NOTE — Evaluation (Signed)
Occupational Therapy Evaluation Patient Details Name: Chelsey [name] MRN: [id number] DOB: 17-Sep-1946 Today's Date: 12/21/2012 Time: 6213-0865 OT Time Calculation (min): 18 min  OT Assessment / Plan / Recommendation Clinical Impression  Pt presents to OT s/p TKR in which pt has decreased I with ADL activity. Pt will benefit from skilled OT to increase I with ADL activity and return to PLOF and decrease burden on husband    OT Assessment  Patient needs continued OT Services    Follow Up Recommendations  No OT follow up             Frequency  Min 2X/week    Precautions / Restrictions Precautions Precautions: Fall;Knee Required Braces or Orthoses: Knee Immobilizer - Left Knee Immobilizer - Left: Discontinue once straight leg raise with < 10 degree lag Restrictions Weight Bearing Restrictions: No LLE Weight Bearing: Weight bearing as tolerated       ADL  Lower Body Bathing: Simulated;Moderate assistance Where Assessed - Lower Body Bathing: Supine, head of bed up Lower Body Dressing: Simulated;Moderate assistance Where Assessed - Lower Body Dressing: Supine, head of bed up Tub/Shower Transfer:  (verbalized safety with tub bench) Transfers/Ambulation Related to ADLs: Pt in bed declining to get OOB due to nausea, but did agree to OT educating pt in regards to ADL activity post TKR. Education provided, as well as demonstration for LB dressing, tub transfer (pt has a bench) and toilet transfer.  Pt agreed to practice ADL activity next day.    OT Diagnosis: Generalized weakness;Acute pain  OT Problem List: Decreased strength;Pain OT Treatment Interventions: Self-care/ADL training;DME and/or AE instruction;Patient/family education   OT Goals Acute Rehab OT Goals OT Goal Formulation: With patient Time For Goal Achievement: 01/04/13 ADL Goals Pt Will Perform Lower Body Dressing: with modified independence;Sit to stand from chair ADL Goal: Lower Body Dressing - Progress: Goal set  today Pt Will Transfer to Toilet: with modified independence;Comfort height toilet ADL Goal: Toilet Transfer - Progress: Goal set today Pt Will Perform Toileting - Clothing Manipulation: with modified independence;Standing ADL Goal: Toileting - Clothing Manipulation - Progress: Goal set today Pt Will Perform Tub/Shower Transfer: Tub transfer;Transfer tub bench;with supervision ADL Goal: Tub/Shower Transfer - Progress: Goal set today  Visit Information  Last OT Received On: 12/21/12 Assistance Needed: +1    Subjective Data  Subjective: I just am feeling sick   Prior Functioning     Home Living Lives With: Spouse Type of Home: House Home Access: Stairs to enter Entrance Stairs-Rails: None Home Layout: Two level;Able to live on main level with bedroom/bathroom Bathroom Shower/Tub: Engineer, manufacturing systems: Standard Home Adaptive Equipment: Tub transfer bench Prior Function Level of Independence: Independent Able to Take Stairs?: Yes Driving: Yes Communication Communication: No difficulties         Vision/Perception Vision - History Patient Visual Report: No change from baseline   Cognition  Cognition Overall Cognitive Status: Appears within functional limits for tasks assessed/performed Arousal/Alertness: Awake/alert Orientation Level: Appears intact for tasks assessed Behavior During Session: [facility] for tasks performed    Extremity/Trunk Assessment Right Upper Extremity Assessment RUE ROM/Strength/Tone: [facility] for tasks assessed Left Upper Extremity Assessment LUE ROM/Strength/Tone: WFL for tasks assessed Right Lower Extremity Assessment RLE ROM/Strength/Tone: [facility] for tasks assessed Left Lower Extremity Assessment LLE ROM/Strength/Tone: Deficits LLE ROM/Strength/Tone Deficits: hip flex 2/5, hip abd/add 2/5, moves ankle well Trunk Assessment Trunk Assessment: Normal     Mobility Bed Mobility Bed Mobility: Supine to Sit Supine to Sit: 4: Min  assist Details for  Bed Mobility Assistance: assist for L LE off bed.  Transfers Sit to Stand: 4: Min assist;From bed Stand to Sit: 4: Min assist;To chair/3-in-1 Details for Transfer Assistance: VCs safety, technique, hand placement. Assist to rise, stabilize, control descent           End of Session OT - End of Session Activity Tolerance: Other (comment) (pt feeling sick) Patient left: in bed;with family/visitor present;with call bell/phone within reach  GO     [facility], [place] 12/21/2012, 2:06 PM

## 2012-12-21 NOTE — Progress Notes (Signed)
RT checked on patient with CPAP. She has her own home nasal mask and tubing. RT filled CPAP with sterile water to fill line. 2L of oxygen bled in. Patient states that she will place herself on later. Husband at bedside. RT encouraged both to call if they needed any further assistance.

## 2012-12-22 DIAGNOSIS — E876 Hypokalemia: Secondary | ICD-10-CM

## 2012-12-22 LAB — CBC
MCV: 88.3 fL (ref 78.0–100.0)
Platelets: 173 10*3/uL (ref 150–400)
RBC: 3.24 MIL/uL — ABNORMAL LOW (ref 3.87–5.11)
RDW: 13.3 % (ref 11.5–15.5)
WBC: 9.3 10*3/uL (ref 4.0–10.5)

## 2012-12-22 LAB — BASIC METABOLIC PANEL
CO2: 27 mEq/L (ref 19–32)
Chloride: 98 mEq/L (ref 96–112)
Creatinine, Ser: 0.57 mg/dL (ref 0.50–1.10)
GFR calc Af Amer: >90 mL/min (ref >90)
Potassium: 3.4 mEq/L — ABNORMAL LOW (ref 3.5–5.1)
Sodium: 135 mEq/L (ref 135–145)

## 2012-12-22 LAB — GLUCOSE, CAPILLARY: Glucose-Capillary: 193 mg/dL — ABNORMAL HIGH (ref 70–99)

## 2012-12-22 MED ORDER — HYDROMORPHONE HCL 2 MG PO TABS: 2.0000 mg | ORAL_TABLET | ORAL | Status: DC | PRN

## 2012-12-22 MED ORDER — TRAMADOL HCL 50 MG PO TABS: 50.0000 mg | ORAL_TABLET | Freq: Four times a day (QID) | ORAL | Status: DC | PRN

## 2012-12-22 MED ORDER — RIVAROXABAN 10 MG PO TABS: 10.0000 mg | ORAL_TABLET | Freq: Every day | ORAL | Status: DC

## 2012-12-22 MED ORDER — METHOCARBAMOL 500 MG PO TABS: 500.0000 mg | ORAL_TABLET | Freq: Four times a day (QID) | ORAL | Status: DC | PRN

## 2012-12-22 MED ORDER — POTASSIUM CHLORIDE CRYS ER 20 MEQ PO TBCR
40.0000 meq | EXTENDED_RELEASE_TABLET | Freq: Once | ORAL | Status: DC
  Filled 2012-12-22: qty 2

## 2012-12-22 NOTE — Progress Notes (Signed)
Pt to d/c home with home health. AVS reviewed and "My Chart" discussed with pt. Pt capable of verbalizing medications and follow-up appointments with Dr. Lequita Halt. Remains hemodynamically stable. No signs and symptoms of distress. Educated pt to return to ER in the case of SOB, dizziness, or chest pain.

## 2012-12-22 NOTE — Progress Notes (Signed)
Physical Therapy Treatment Patient Details Name: Chelsey [name] MRN: [id number] DOB: 06/08/1946 Today's Date: 12/22/2012 Time: 4098-1191 PT Time Calculation (min): 28 min  PT Assessment / Plan / Recommendation Comments on Treatment Session  Progressing with mobility. Plan is for d/c later today. Will have 2nd session this afternoon. Recommend HHPT.     Follow Up Recommendations  Home health PT     Does the patient have the potential to tolerate intense rehabilitation     Barriers to Discharge        Equipment Recommendations  Rolling walker with 5" wheels    Recommendations for Other Services    Frequency 7X/week   Plan Discharge plan remains appropriate    Precautions / Restrictions Precautions Precautions: Fall;Knee Required Braces or Orthoses: Knee Immobilizer - Left Knee Immobilizer - Left: Discontinue once straight leg raise with < 10 degree lag Restrictions Weight Bearing Restrictions: No LLE Weight Bearing: Weight bearing as tolerated   Pertinent Vitals/Pain 5/10 L knee    Mobility  Transfers Transfers: Sit to Stand;Stand to Sit Sit to Stand: 4: Min assist;From chair/3-in-1 Stand to Sit: 4: Min guard;To chair/3-in-1 Details for Transfer Assistance: VCs safety, technique, hand placement. Assist to rise, stabilize Ambulation/Gait Ambulation/Gait Assistance: 4: Min guard Ambulation Distance (Feet): 75 Feet Assistive device: Rolling walker Ambulation/Gait Assistance Details: VCS safety, sequence, step length. Close-guarding. Followed with recliner.  Gait Pattern: Step-to pattern;Antalgic;Decreased stride length;Decreased step length - left Stairs: Yes Stairs Assistance: 4: Min assist Stairs Assistance Details (indicate cue type and reason): VCs safety, technique, sequence. Husband present and assisted with stabilizing walker.  Stair Management Technique: Backwards;With walker Number of Stairs: 4    Exercises     PT Diagnosis:    PT Problem List:   PT Treatment  Interventions:     PT Goals Acute Rehab PT Goals Pt will go Sit to Stand: with supervision PT Goal: Sit to Stand - Progress: Progressing toward goal Pt will Ambulate: 51 - 150 feet;with supervision;with rolling walker PT Goal: Ambulate - Progress: Progressing toward goal Pt will Go Up / Down Stairs: 1-2 stairs;with least restrictive assistive device PT Goal: Up/Down Stairs - Progress: Progressing toward goal  Visit Information  Last PT Received On: 12/22/12 Assistance Needed: +1    Subjective Data  Subjective: I feel better today Patient Stated Goal: home   Cognition  Cognition Overall Cognitive Status: Appears within functional limits for tasks assessed/performed Arousal/Alertness: Awake/alert Orientation Level: Appears intact for tasks assessed Behavior During Session: [facility] for tasks performed    Balance     End of Session PT - End of Session Equipment Utilized During Treatment: Gait belt;Left knee immobilizer Activity Tolerance: Patient limited by fatigue Patient left: in chair;with call bell/phone within reach;with family/visitor present   GP     Rebeca Alert, MPT Pager: [phone]

## 2012-12-22 NOTE — Progress Notes (Signed)
Occupational Therapy Treatment Patient Details Name: Chelsey [name] MRN: [id number] DOB: Mar 25, 1946 Today's Date: 12/22/2012 Time: 1308-6578 OT Time Calculation (min): 22 min  OT Assessment / Plan / Recommendation    Follow Up Recommendations  No OT follow up    Barriers to Discharge       Equipment Recommendations  None recommended by OT          Plan Discharge plan remains appropriate    Precautions / Restrictions Restrictions Weight Bearing Restrictions: No LLE Weight Bearing: Weight bearing as tolerated       ADL  Lower Body Dressing: Performed;Moderate assistance;Other (comment) (husband will A as needed) Where Assessed - Lower Body Dressing: Unsupported sit to stand Toilet Transfer: Simulated;Minimal assistance Toilet Transfer Method: Sit to stand Toilet Transfer Equipment: Bedside commode Toileting - Clothing Manipulation and Hygiene: Performed;Min guard Where Assessed - Toileting Clothing Manipulation and Hygiene: Standing Transfers/Ambulation Related to ADLs: Husband will A as needed. Pt and husband verbalized understanding of tub transfer.  Safety education with ADL activity complete      OT Goals ADL Goals ADL Goal: Lower Body Dressing - Progress: Progressing toward goals ADL Goal: Toilet Transfer - Progress: Progressing toward goals ADL Goal: Toileting - Clothing Manipulation - Progress: Progressing toward goals  Visit Information  Last OT Received On: 12/22/12          Cognition  Cognition Overall Cognitive Status: Appears within functional limits for tasks assessed/performed Arousal/Alertness: Awake/alert Orientation Level: Appears intact for tasks assessed Behavior During Session: [facility] for tasks performed    Mobility  Transfers Transfers: Sit to Stand;Stand to Sit Sit to Stand: 4: Min assist;With upper extremity assist;From chair/3-in-1 Stand to Sit: 4: Min assist;With upper extremity assist;To chair/3-in-1          End of Session OT - End of  Session Activity Tolerance: Patient tolerated treatment well Patient left: in chair;with call bell/phone within reach;with family/visitor present CPM Left Knee CPM Left Knee: Off  GO     [name] 12/22/2012, 10:25 AM

## 2012-12-22 NOTE — Progress Notes (Signed)
   Subjective: 2 Days Post-Op Procedure(s) (LRB): TOTAL KNEE ARTHROPLASTY (Left) Patient reports pain as mild.   Patient seen in rounds for Dr. Lequita Halt. Patient is well, and has had no acute complaints or problems Patient is ready to go home later today.  Objective: Vital signs in last 24 hours: Temp:  [98.9 F (37.2 C)-99.9 F (37.7 C)] 99.4 F (37.4 C) (03/12 0436) Pulse Rate:  [84-94] 93 (03/12 0800) Resp:  [14-16] 16 (03/12 1200) BP: (94-120)/(55-71) 94/55 mmHg (03/12 0800) SpO2:  [93 %-97 %] 93 % (03/12 0800)  Intake/Output from previous day:  Intake/Output Summary (Last 24 hours) at 12/22/12 1322 Last data filed at 12/22/12 1200  Gross per 24 hour  Intake   1290 ml  Output      0 ml  Net   1290 ml    Intake/Output this shift: Total I/O In: 720 [P.O.:720] Out: -   Labs:  Recent Labs  12/21/12 0645 12/22/12 0447  HGB 10.4* 10.0*    Recent Labs  12/21/12 0645 12/22/12 0447  WBC 9.1 9.3  RBC 3.34* 3.24*  HCT 29.4* 28.6*  PLT 163 173    Recent Labs  12/21/12 0456 12/22/12 0447  NA 130* 135  K 4.0 3.4*  CL 96 98  CO2 22 27  BUN 8 7  CREATININE 0.49* 0.57  GLUCOSE 162* 209*  CALCIUM 8.4 8.8   No results found for this basename: LABPT, INR,  in the last 72 hours  EXAM: General - Patient is Alert, Appropriate and Oriented Extremity - Neurovascular intact Sensation intact distally Dorsiflexion/Plantar flexion intact No cellulitis present Incision - clean, dry, no drainage, healing Motor Function - intact, moving foot and toes well on exam.   Assessment/Plan: 2 Days Post-Op Procedure(s) (LRB): TOTAL KNEE ARTHROPLASTY (Left) Procedure(s) (LRB): TOTAL KNEE ARTHROPLASTY (Left) Past Medical History  Diagnosis Date  . Diabetes mellitus   . Ulcer   . Allergy   . Arthritis     osteoarthritis  . Hypertension   . Hyperlipidemia   . GERD (gastroesophageal reflux disease)   . Pneumonia 07/28/2011  . Sleep apnea     setting of 10  . Cancer      breast   Principal Problem:   OA (osteoarthritis) of knee Active Problems:   Hyponatremia  Estimated body mass index is 35.17 kg/(m^2) as calculated from the following:   Height as of this encounter: 5\' 4"  (1.626 m).   Weight as of this encounter: 92.987 kg (205 lb). Up with therapy Discharge home with home health Diet - Cardiac diet and Diabetic diet Follow up - in 2 weeks Activity - WBAT Disposition - Home Condition Upon Discharge - Good D/C Meds - See DC Summary DVT Prophylaxis - Xarelto  PERKINS, ALEXZANDREW 12/22/2012, 1:22 PM

## 2012-12-22 NOTE — Discharge Summary (Signed)
Physician Discharge Summary   Patient ID: Chelsey [name] MRN: [id number] DOB/AGE: [birth year] 67 y.o.  Admit date: 12/20/2012 Discharge date: 12/22/2012  Primary Diagnosis:  Osteoarthritis Left knee  Admission Diagnoses:  Past Medical History  Diagnosis Date  . Diabetes mellitus   . Ulcer   . Allergy   . Arthritis     osteoarthritis  . Hypertension   . Hyperlipidemia   . GERD (gastroesophageal reflux disease)   . Pneumonia 07/28/2011  . Sleep apnea     setting of 10  . Cancer     breast   Discharge Diagnoses:   Principal Problem:   OA (osteoarthritis) of knee Active Problems:   Hyponatremia   Postop Hypokalemia  Estimated body mass index is 35.17 kg/(m^2) as calculated from the following:   Height as of this encounter: 5\' 4"  (1.626 m).   Weight as of this encounter: 92.987 kg (205 lb).  Procedure:  Procedure(s) (LRB): TOTAL KNEE ARTHROPLASTY (Left)   Consults: None  HPI: Chelsey [name] is a 67 y.o. year old female with end stage OA of her left knee with progressively worsening pain and dysfunction. She has constant pain, with activity and at rest and significant functional deficits with difficulties even with ADLs. She has had extensive non-op management including analgesics, injections of cortisone and viscosupplements, and home exercise program, but remains in significant pain with significant dysfunction. Radiographs show bone on bone arthritis medial and patellofemoral with varus deformity. She presents now for left Total Knee Arthroplasty.   Laboratory Data: Admission on 12/20/2012, Discharged on 12/22/2012  Component Date Value Range Status  . ABO/RH(D) 12/20/2012 O POS   Final  . Antibody Screen 12/20/2012 NEG   Final  . Sample Expiration 12/20/2012 12/23/2012   Final  . Glucose-Capillary 12/20/2012 170* 70 - 99 mg/dL Final  . ABO/RH(D) 40/98/1191 O POS   Final  . Glucose-Capillary 12/20/2012 119* 70 - 99 mg/dL Final  . Sodium 47/82/9562 130* 135 - 145 mEq/L Final    . Potassium 12/21/2012 4.0  3.5 - 5.1 mEq/L Final  . Chloride 12/21/2012 96  96 - 112 mEq/L Final  . CO2 12/21/2012 22  19 - 32 mEq/L Final  . Glucose, Bld 12/21/2012 162* 70 - 99 mg/dL Final  . BUN 13/05/6577 8  6 - 23 mg/dL Final  . Creatinine, Ser 12/21/2012 0.49* 0.50 - 1.10 mg/dL Final  . Calcium 46/96/2952 8.4  8.4 - 10.5 mg/dL Final  . GFR calc non Af Amer 12/21/2012 >90  >90 mL/min Final  . GFR calc Af Amer 12/21/2012 >90  >90 mL/min Final   Comment:                                 The eGFR has been calculated                          using the CKD EPI equation.                          This calculation has not been                          validated in all clinical                          situations.  eGFR's persistently                          <90 mL/min signify                          possible Chronic Kidney Disease.  . WBC 12/21/2012 9.1  4.0 - 10.5 K/uL Final  . RBC 12/21/2012 3.34* 3.87 - 5.11 MIL/uL Final  . Hemoglobin 12/21/2012 10.4* 12.0 - 15.0 g/dL Final  . HCT 16/07/9603 29.4* 36.0 - 46.0 % Final  . MCV 12/21/2012 88.0  78.0 - 100.0 fL Final  . MCH 12/21/2012 31.1  26.0 - 34.0 pg Final  . MCHC 12/21/2012 35.4  30.0 - 36.0 g/dL Final  . RDW 54/06/8118 13.2  11.5 - 15.5 % Final  . Platelets 12/21/2012 163  150 - 400 K/uL Final  . Glucose-Capillary 12/20/2012 171* 70 - 99 mg/dL Final  . Glucose-Capillary 12/20/2012 201* 70 - 99 mg/dL Final  . Glucose-Capillary 12/21/2012 162* 70 - 99 mg/dL Final  . Comment 1 14/78/2956 Notify RN   Final  . Comment 2 12/21/2012 Documented in Chart   Final  . Glucose-Capillary 12/21/2012 155* 70 - 99 mg/dL Final  . Comment 1 21/30/8657 Notify RN   Final  . Comment 2 12/21/2012 Documented in Chart   Final  . WBC 12/22/2012 9.3  4.0 - 10.5 K/uL Final  . RBC 12/22/2012 3.24* 3.87 - 5.11 MIL/uL Final  . Hemoglobin 12/22/2012 10.0* 12.0 - 15.0 g/dL Final  . HCT 84/69/6295 28.6* 36.0 - 46.0 % Final  . MCV  12/22/2012 88.3  78.0 - 100.0 fL Final  . MCH 12/22/2012 30.9  26.0 - 34.0 pg Final  . MCHC 12/22/2012 35.0  30.0 - 36.0 g/dL Final  . RDW 28/41/3244 13.3  11.5 - 15.5 % Final  . Platelets 12/22/2012 173  150 - 400 K/uL Final  . Sodium 12/22/2012 135  135 - 145 mEq/L Final  . Potassium 12/22/2012 3.4* 3.5 - 5.1 mEq/L Final  . Chloride 12/22/2012 98  96 - 112 mEq/L Final  . CO2 12/22/2012 27  19 - 32 mEq/L Final  . Glucose, Bld 12/22/2012 209* 70 - 99 mg/dL Final  . BUN 10/15/7251 7  6 - 23 mg/dL Final  . Creatinine, Ser 12/22/2012 0.57  0.50 - 1.10 mg/dL Final  . Calcium 66/44/0347 8.8  8.4 - 10.5 mg/dL Final  . GFR calc non Af Amer 12/22/2012 >90  >90 mL/min Final  . GFR calc Af Amer 12/22/2012 >90  >90 mL/min Final   Comment:                                 The eGFR has been calculated                          using the CKD EPI equation.                          This calculation has not been                          validated in all clinical  situations.                          eGFR's persistently                          <90 mL/min signify                          possible Chronic Kidney Disease.  . Glucose-Capillary 12/21/2012 153* 70 - 99 mg/dL Final  . Comment 1 47/82/9562 Notify RN   Final  . Comment 2 12/21/2012 Documented in Chart   Final  . Glucose-Capillary 12/21/2012 170* 70 - 99 mg/dL Final  . Glucose-Capillary 12/22/2012 193* 70 - 99 mg/dL Final  . Glucose-Capillary 12/22/2012 158* 70 - 99 mg/dL Final  . Comment 1 13/05/6577 Notify RN   Final  Hospital Outpatient Visit on 12/15/2012  Component Date Value Range Status  . MRSA, PCR 12/15/2012 NEGATIVE  NEGATIVE Final  . Staphylococcus aureus 12/15/2012 POSITIVE* NEGATIVE Final   Comment:                                 The Xpert SA Assay (FDA                          approved for NASAL specimens                          in patients over 91 years of age),                          is one  component of                          a comprehensive surveillance                          program.  Test performance has                          been validated by [employer] for patients greater                          than or equal to 7 year old.                          It is not intended                          to diagnose infection nor to                          guide or monitor treatment.  Marland Kitchen aPTT 12/15/2012 29  24 - 37 seconds Final  . WBC 12/15/2012 5.4  4.0 - 10.5 K/uL Final  . RBC 12/15/2012 4.47  3.87 - 5.11 MIL/uL Final  . Hemoglobin 12/15/2012 13.6  12.0 - 15.0 g/dL Final  . HCT 46/96/2952  40.1  36.0 - 46.0 % Final  . MCV 12/15/2012 89.7  78.0 - 100.0 fL Final  . MCH 12/15/2012 30.4  26.0 - 34.0 pg Final  . MCHC 12/15/2012 33.9  30.0 - 36.0 g/dL Final  . RDW 16/07/9603 12.9  11.5 - 15.5 % Final  . Platelets 12/15/2012 214  150 - 400 K/uL Final  . Sodium 12/15/2012 141  135 - 145 mEq/L Final  . Potassium 12/15/2012 4.4  3.5 - 5.1 mEq/L Final  . Chloride 12/15/2012 103  96 - 112 mEq/L Final  . CO2 12/15/2012 27  19 - 32 mEq/L Final  . Glucose, Bld 12/15/2012 189* 70 - 99 mg/dL Final  . BUN 54/06/8118 12  6 - 23 mg/dL Final  . Creatinine, Ser 12/15/2012 0.63  0.50 - 1.10 mg/dL Final  . Calcium 14/78/2956 10.4  8.4 - 10.5 mg/dL Final  . Total Protein 12/15/2012 7.2  6.0 - 8.3 g/dL Final  . Albumin 21/30/8657 4.1  3.5 - 5.2 g/dL Final  . AST 84/69/6295 39* 0 - 37 U/L Final  . ALT 12/15/2012 45* 0 - 35 U/L Final  . Alkaline Phosphatase 12/15/2012 79  39 - 117 U/L Final  . Total Bilirubin 12/15/2012 0.4  0.3 - 1.2 mg/dL Final  . GFR calc non Af Amer 12/15/2012 >90  >90 mL/min Final  . GFR calc Af Amer 12/15/2012 >90  >90 mL/min Final   Comment:                                 The eGFR has been calculated                          using the CKD EPI equation.                          This calculation has not been                           validated in all clinical                          situations.                          eGFR's persistently                          <90 mL/min signify                          possible Chronic Kidney Disease.  Marland Kitchen Prothrombin Time 12/15/2012 12.4  11.6 - 15.2 seconds Final  . INR 12/15/2012 0.93  0.00 - 1.49 Final  . Color, Urine 12/15/2012 YELLOW  YELLOW Final  . APPearance 12/15/2012 CLEAR  CLEAR Final  . Specific Gravity, Urine 12/15/2012 1.009  1.005 - 1.030 Final  . pH 12/15/2012 5.5  5.0 - 8.0 Final  . Glucose, UA 12/15/2012 500* NEGATIVE mg/dL Final  . Hgb urine dipstick 12/15/2012 NEGATIVE  NEGATIVE Final  . Bilirubin Urine 12/15/2012 NEGATIVE  NEGATIVE Final  . Ketones, ur 12/15/2012 NEGATIVE  NEGATIVE mg/dL Final  . Protein, ur 28/41/3244 NEGATIVE  NEGATIVE mg/dL Final  . Urobilinogen, UA 12/15/2012 0.2  0.0 -  1.0 mg/dL Final  . Nitrite 16/07/9603 NEGATIVE  NEGATIVE Final  . Leukocytes, UA 12/15/2012 TRACE* NEGATIVE Final  . Squamous Epithelial / LPF 12/15/2012 RARE  RARE Final  . WBC, UA 12/15/2012 0-2  <3 WBC/hpf Final  . Bacteria, UA 12/15/2012 RARE  RARE Final  Office Visit on 11/05/2012  Component Date Value Range Status  . Cholesterol 11/05/2012 168  0 - 200 mg/dL Final   ATP III Classification       Desirable:  < 200 mg/dL               Borderline High:  200 - 239 mg/dL          High:  > = 540 mg/dL  . Triglycerides 11/05/2012 132.0  0.0 - 149.0 mg/dL Final   Normal:  <981 mg/dLBorderline High:  150 - 199 mg/dL  . HDL 11/05/2012 60.30  >39.00 mg/dL Final  . VLDL 19/14/7829 26.4  0.0 - 40.0 mg/dL Final  . LDL Cholesterol 11/05/2012 81  0 - 99 mg/dL Final  . Total CHOL/HDL Ratio 11/05/2012 3   Final                  Men          Women1/2 Average Risk     3.4          3.3Average Risk          5.0          4.42X Average Risk          9.6          7.13X Average Risk          15.0          11.0                      . Sodium 11/05/2012 144  135 - 145 mEq/L Final  .  Potassium 11/05/2012 5.3* 3.5 - 5.1 mEq/L Final  . Chloride 11/05/2012 106  96 - 112 mEq/L Final  . CO2 11/05/2012 27  19 - 32 mEq/L Final  . Glucose, Bld 11/05/2012 170* 70 - 99 mg/dL Final  . BUN 56/21/3086 13  6 - 23 mg/dL Final  . Creatinine, Ser 11/05/2012 0.8  0.4 - 1.2 mg/dL Final  . Calcium 57/84/6962 10.5  8.4 - 10.5 mg/dL Final  . GFR 95/28/4132 82.29  >60.00 mL/min Final  . TSH 11/05/2012 1.58  0.35 - 5.50 uIU/mL Final  . Hemoglobin A1C 11/05/2012 7.4* 4.6 - 6.5 % Final   Glycemic Control Guidelines for People with Diabetes:Non Diabetic:  <6%Goal of Therapy: <7%Additional Action Suggested:  >8%      X-Rays:Dg Chest 2 View  12/15/2012  *RADIOLOGY REPORT*  Clinical Data: Preoperative evaluation.  CHEST - 2 VIEW  Comparison: Chest x-ray 07/29/2011.  Findings: Lung volumes are normal.  No consolidative airspace disease.  No pleural effusions.  No pneumothorax.  No pulmonary nodule or mass noted.  Pulmonary vasculature and the cardiomediastinal silhouette are within normal limits. Atherosclerosis in the thoracic aorta.  Surgical clips project over the left axillary region, presumably from prior lymph node dissection.  Large rim calcified structure projecting over the right upper quadrant of the abdomen is most compatible with a calcified gallstone.  IMPRESSION: 1.  No radiographic evidence of acute cardiopulmonary disease. 2.  Cholelithiasis. 3.  Atherosclerosis.   Original Report Authenticated By: Trudie Reed, M.D.     EKG: Orders placed during the hospital encounter  of 12/20/12  . EKG     Hospital Course: Chelsey [name] is a 67 y.o. who was admitted to [facility]. They were brought to the operating room on 12/20/2012 and underwent Procedure(s): TOTAL KNEE ARTHROPLASTY.  Patient tolerated the procedure well and was later transferred to the recovery room and then to the orthopaedic floor for postoperative care.  They were given PO and IV analgesics for pain control following  their surgery.  They were given 24 hours of postoperative antibiotics of  Anti-infectives   Start     Dose/Rate Route Frequency Ordered Stop   12/20/12 2330  vancomycin (VANCOCIN) IVPB 1000 mg/200 mL premix     1,000 mg 200 mL/hr over 60 Minutes Intravenous Every 12 hours 12/20/12 1430 12/21/12 0056   12/20/12 0908  vancomycin (VANCOCIN) 1,500 mg in sodium chloride 0.9 % 500 mL IVPB     1,500 mg 250 mL/hr over 120 Minutes Intravenous On call to O.R. 12/20/12 0908 12/20/12 1119     and started on DVT prophylaxis in the form of Xarelto.   PT and OT were ordered for total joint protocol.  Discharge planning consulted to help with postop disposition and equipment needs.  Patient had a tough night on the evening of surgery due to pain.  They started to get up OOB with therapy on day one walking about 45 feet. Hemovac drain was pulled without difficulty.  Continued to work with therapy into day two.  Dressing was changed on day two and the incision was healing well.  Patient was seen in rounds and was ready to go home later that same day.   Discharge Medications: Prior to Admission medications   Medication Sig Start Date End Date Taking? Authorizing Belle Charlie  ALPRAZolam (XANAX) 0.25 MG tablet Take 1 tablet (0.25 mg total) by mouth at bedtime as needed for sleep or anxiety. 09/13/12  Yes Madelin Headings, MD  anastrozole (ARIMIDEX) 1 MG tablet Take 1 tablet (1 mg total) by mouth daily. 09/07/12  Yes Pierce Crane, MD  cycloSPORINE (RESTASIS) 0.05 % ophthalmic emulsion Place 1 drop into both eyes daily.   Yes Historical Uriah Trueba, MD  losartan (COZAAR) 50 MG tablet Take 50 mg by mouth at bedtime.   Yes Historical Lemmie Vanlanen, MD  metFORMIN (GLUMETZA) 500 MG (MOD) 24 hr tablet Take 500-1,000 mg by mouth 2 (two) times daily. 2 tablets with breakfast and 1 tablet with dinner   Yes Historical Anette Barra, MD  omeprazole (PRILOSEC) 20 MG capsule Take 20 mg by mouth daily.   Yes Historical Charna Neeb, MD  sertraline  (ZOLOFT) 25 MG tablet Take 25 mg by mouth at bedtime.    Yes Historical Cordarrius Coad, MD  CRESTOR 10 MG tablet take 1 tablet by mouth once daily 12/01/12   Madelin Headings, MD  fluticasone (FLONASE) 50 MCG/ACT nasal spray Place 2 sprays into the nose daily as needed for rhinitis or allergies.    Historical Toriann Spadoni, MD  HYDROmorphone (DILAUDID) 2 MG tablet Take 1-2 tablets (2-4 mg total) by mouth every 4 (four) hours as needed. 12/22/12   Alexzandrew Julien Girt, PA-C  methocarbamol (ROBAXIN) 500 MG tablet Take 1 tablet (500 mg total) by mouth every 6 (six) hours as needed. 12/22/12   Alexzandrew Julien Girt, PA-C  rivaroxaban (XARELTO) 10 MG TABS tablet Take 1 tablet (10 mg total) by mouth daily with breakfast. Take Xarelto for two and a half more weeks, then discontinue Xarelto. 12/22/12   Alexzandrew Perkins, PA-C  traMADol (ULTRAM) 50 MG tablet  Take 1-2 tablets (50-100 mg total) by mouth every 6 (six) hours as needed. 12/22/12   Alexzandrew Julien Girt, PA-C    Diet: Cardiac diet and Diabetic diet Activity:WBAT Follow-up:in 2 weeks Disposition - Home Discharged Condition: good       Discharge Orders   Future Appointments Laylana Gerwig Department Dept Phone   03/11/2013 8:15 AM [facility] [place] [phone]   03/18/2013 10:30 AM Madelin Headings, MD [facility] [place] [phone]   03/30/2013 10:00 AM Windell [facility] MEDICAL ONCOLOGY [phone]   04/06/2013 10:00 AM Keitha Butte, NP [facility] MEDICAL ONCOLOGY [phone]   10/14/2013 10:15 AM Waymon Budge, MD [facility] [phone]   Future Orders Complete By Expires     Call MD / Call 911  As directed     Comments:      If you experience chest pain or shortness of breath, CALL 911 and be transported to the hospital emergency room.  If you develope a fever above 101 F, pus (white drainage) or increased drainage or redness at the wound, or calf pain, call your  surgeon's office.    Change dressing  As directed     Comments:      Change dressing daily with sterile 4 x 4 inch gauze dressing and apply TED hose. Do not submerge the incision under water.    Constipation Prevention  As directed     Comments:      Drink plenty of fluids.  Prune juice may be helpful.  You may use a stool softener, such as Colace (over the counter) 100 mg twice a day.  Use MiraLax (over the counter) for constipation as needed.    Diet - low sodium heart healthy  As directed     Diet Carb Modified  As directed     Discharge instructions  As directed     Comments:      Pick up stool softner and laxative for home. Do not submerge incision under water. May shower. Continue to use ice for pain and swelling from surgery.  Take Xarelto for two and a half more weeks, then discontinue Xarelto.    Do not put a pillow under the knee. Place it under the heel.  As directed     Do not sit on low chairs, stoools or toilet seats, as it may be difficult to get up from low surfaces  As directed     Driving restrictions  As directed     Comments:      No driving until released by the physician.    Increase activity slowly as tolerated  As directed     Lifting restrictions  As directed     Comments:      No lifting until released by the physician.    Patient may shower  As directed     Comments:      You may shower without a dressing once there is no drainage.  Do not wash over the wound.  If drainage remains, do not shower until drainage stops.    TED hose  As directed     Comments:      Use stockings (TED hose) for 3 weeks on both leg(s).  You may remove them at night for sleeping.    Weight bearing as tolerated  As directed         Medication List    STOP taking these medications  calcium carbonate 500 MG chewable tablet  Commonly known as:  TUMS - dosed in mg elemental calcium     CALTRATE 600 PO     cholecalciferol 1000 UNITS tablet  Commonly known as:  VITAMIN D      diclofenac sodium 1 % Gel  Commonly known as:  VOLTAREN     multivitamin with minerals Tabs     pyridOXINE 100 MG tablet  Commonly known as:  VITAMIN B-6     vitamin B-12 1000 MCG tablet  Commonly known as:  CYANOCOBALAMIN      TAKE these medications       ALPRAZolam 0.25 MG tablet  Commonly known as:  XANAX  Take 1 tablet (0.25 mg total) by mouth at bedtime as needed for sleep or anxiety.     anastrozole 1 MG tablet  Commonly known as:  ARIMIDEX  Take 1 tablet (1 mg total) by mouth daily.     CRESTOR 10 MG tablet  Generic drug:  rosuvastatin  take 1 tablet by mouth once daily     cycloSPORINE 0.05 % ophthalmic emulsion  Commonly known as:  RESTASIS  Place 1 drop into both eyes daily.     fluticasone 50 MCG/ACT nasal spray  Commonly known as:  FLONASE  Place 2 sprays into the nose daily as needed for rhinitis or allergies.     HYDROmorphone 2 MG tablet  Commonly known as:  DILAUDID  Take 1-2 tablets (2-4 mg total) by mouth every 4 (four) hours as needed.     losartan 50 MG tablet  Commonly known as:  COZAAR  Take 50 mg by mouth at bedtime.     metFORMIN 500 MG (MOD) 24 hr tablet  Commonly known as:  GLUMETZA  Take 500-1,000 mg by mouth 2 (two) times daily. 2 tablets with breakfast and 1 tablet with dinner     methocarbamol 500 MG tablet  Commonly known as:  ROBAXIN  Take 1 tablet (500 mg total) by mouth every 6 (six) hours as needed.     omeprazole 20 MG capsule  Commonly known as:  PRILOSEC  Take 20 mg by mouth daily.     rivaroxaban 10 MG Tabs tablet  Commonly known as:  XARELTO  Take 1 tablet (10 mg total) by mouth daily with breakfast. Take Xarelto for two and a half more weeks, then discontinue Xarelto.     sertraline 25 MG tablet  Commonly known as:  ZOLOFT  Take 25 mg by mouth at bedtime.     traMADol 50 MG tablet  Commonly known as:  ULTRAM  Take 1-2 tablets (50-100 mg total) by mouth every 6 (six) hours as needed.       Follow-up  Information   Follow up with Loanne Drilling, MD. Schedule an appointment as soon as possible for a visit in 2 weeks.   Contact information:   [address], [address] [place] [address] [phone]       Signed: Patrica Duel 01/04/2013, 8:43 AM

## 2012-12-23 DIAGNOSIS — Z471 Aftercare following joint replacement surgery: Secondary | ICD-10-CM | POA: Diagnosis not present

## 2012-12-23 DIAGNOSIS — IMO0001 Reserved for inherently not codable concepts without codable children: Secondary | ICD-10-CM | POA: Diagnosis not present

## 2012-12-23 DIAGNOSIS — Z96659 Presence of unspecified artificial knee joint: Secondary | ICD-10-CM | POA: Diagnosis not present

## 2012-12-23 DIAGNOSIS — M1710 Unilateral primary osteoarthritis, unspecified knee: Secondary | ICD-10-CM | POA: Diagnosis not present

## 2012-12-23 DIAGNOSIS — E119 Type 2 diabetes mellitus without complications: Secondary | ICD-10-CM | POA: Diagnosis not present

## 2012-12-24 ENCOUNTER — Telehealth: Payer: Self-pay | Admitting: Internal Medicine

## 2012-12-24 ENCOUNTER — Other Ambulatory Visit: Payer: Self-pay | Admitting: Family Medicine

## 2012-12-24 DIAGNOSIS — M1710 Unilateral primary osteoarthritis, unspecified knee: Secondary | ICD-10-CM | POA: Diagnosis not present

## 2012-12-24 DIAGNOSIS — Z96659 Presence of unspecified artificial knee joint: Secondary | ICD-10-CM | POA: Diagnosis not present

## 2012-12-24 DIAGNOSIS — Z471 Aftercare following joint replacement surgery: Secondary | ICD-10-CM | POA: Diagnosis not present

## 2012-12-24 DIAGNOSIS — IMO0001 Reserved for inherently not codable concepts without codable children: Secondary | ICD-10-CM | POA: Diagnosis not present

## 2012-12-24 DIAGNOSIS — E119 Type 2 diabetes mellitus without complications: Secondary | ICD-10-CM | POA: Diagnosis not present

## 2012-12-24 MED ORDER — SULFAMETHOXAZOLE-TRIMETHOPRIM 800-160 MG PO TABS: 1.0000 | ORAL_TABLET | Freq: Two times a day (BID) | ORAL | Status: DC

## 2012-12-24 NOTE — Telephone Encounter (Signed)
Spoke to the pt.  She is not on any antibiotics.  Is taking Xarelto for 2.5wks.  Per WP.  Sent in Septra DS 1 po bid for three days #6.

## 2012-12-24 NOTE — Telephone Encounter (Signed)
Patient is calling back to check on the status of her request for UTI and constipation issues.  She states that she has not heard from the office at this time.  REQUESTING FOLLOW UP .[name], Chelsey [name]  /PCP  Dr. Fabian Sharp

## 2012-12-24 NOTE — Telephone Encounter (Signed)
Left message at home number for the pt to return my call. 

## 2012-12-24 NOTE — Telephone Encounter (Signed)
Patient Information:  Caller Name: Edla  Phone: [phone]  Patient: Chelsey [name], Chelsey [name]  Gender: Female  DOB: 10/16/1945  Age: 67 Years  PCP: Berniece Andreas (Family Practice)  Office Follow Up:  Does the office need to follow up with this patient?: Yes  Instructions For The Office: She states she can't come to the office due being on Walker post knee replacement surgery/unable to get in the car and travel.  She did speak with surgeons office and they referred her to pcp.  She can get someone to bring a U/A specimen.  Needs assistance with constipation and UTI.  [facility] [facility] PLEASE CONTACT PATIENT.  RN Note:  She states she can't come to the office due being on Walker post knee replacement surgery/unable to get in the car and travel.  She did speak with surgeons office and they referred her to pcp.  She can get someone to bring a U/A specimen.  Needs assistance with constipation and UTI.  [facility] PLEASE CONTACT PATIENT.  Symptoms  Reason For Call & Symptoms: Patient has a Left Knee replacement on Monday 12/20/12. HAVING ISSUES WITH UTI AND CONSTPATION . SURGEON REFERRED HER TO PCP.    (1) She feels like she has a UTI. +burning, cloudy urine, Odor and frequency.  Onset 12/22/12. Last uop- a few minutes ago  (2)  Constipation- Last BM sunday, 12/19/12.  Treatment at home Colace, Stool softeners, miralax with no results. No vomiting.  Pain medication-Tramadol , Hydrocodone, muscle spasma medication  Reviewed Health History In EMR: No  Reviewed Medications In EMR: No  Reviewed Allergies In EMR: Yes  Reviewed Surgeries / Procedures: Yes  Date of Onset of Symptoms: 12/22/2012  Treatments Tried: Colace, miralax, stool softners  Treatments Tried Worked: No  Guideline(s) Used:  Urination Pain - Female  Constipation  Disposition Per Guideline:   See Today in Office  Reason For Disposition Reached:   Last bowel movement (BM) > 4 days  ago  Advice Given:  Fluids:   Drink extra fluids. Drink 8-10 glasses of liquids a day (Reason: to produce a dilute, non-irritating urine).  Cranberry Juice:   Some people think that drinking cranberry juice may help in fighting urinary tract infections. However, there is no good research that has ever proved this.  General Constipation Instructions:  Eat a high fiber diet.  Don't ignore your body's signals to have a BM.  Liquids:  Drink 6-8 glasses of water a day (Caution: certain medical conditions require fluid restriction).  Prune juice is a natural laxative.  Avoid alcohol.  High Fiber Diet:  Try to eat fresh fruit and vegetables at each meal (peas, prunes, citrus, apples, beans, corn).  Eat more grain foods (bran flakes, bran muffins, graham crackers, oatmeal, brown rice, and whole wheat bread). Popcorn is a source of fiber.  Call Back If:  Constipation continues (i.e., less than 3 BMs / week or straining more than 25% of the time) after following care advice for constipation for 2 weeks  You become worse  Patient Refused Recommendation:  Patient Requests Prescription  She states she can't come to the office due being on Walker post knee replacement surgery/unable to get in the car and travel.  She did speak with surgeons office and they referred her to pcp.  She can get someone to bring a U/A specimen.  Needs assistance with constipation and UTI.  Ph[facility] [place] [facility] PLEASE CONTACT PATIENT.

## 2012-12-27 DIAGNOSIS — M1710 Unilateral primary osteoarthritis, unspecified knee: Secondary | ICD-10-CM | POA: Diagnosis not present

## 2012-12-27 DIAGNOSIS — Z471 Aftercare following joint replacement surgery: Secondary | ICD-10-CM | POA: Diagnosis not present

## 2012-12-27 DIAGNOSIS — Z96659 Presence of unspecified artificial knee joint: Secondary | ICD-10-CM | POA: Diagnosis not present

## 2012-12-27 DIAGNOSIS — IMO0001 Reserved for inherently not codable concepts without codable children: Secondary | ICD-10-CM | POA: Diagnosis not present

## 2012-12-27 DIAGNOSIS — E119 Type 2 diabetes mellitus without complications: Secondary | ICD-10-CM | POA: Diagnosis not present

## 2012-12-28 DIAGNOSIS — M1710 Unilateral primary osteoarthritis, unspecified knee: Secondary | ICD-10-CM | POA: Diagnosis not present

## 2012-12-28 DIAGNOSIS — Z471 Aftercare following joint replacement surgery: Secondary | ICD-10-CM | POA: Diagnosis not present

## 2012-12-28 DIAGNOSIS — IMO0001 Reserved for inherently not codable concepts without codable children: Secondary | ICD-10-CM | POA: Diagnosis not present

## 2012-12-28 DIAGNOSIS — Z96659 Presence of unspecified artificial knee joint: Secondary | ICD-10-CM | POA: Diagnosis not present

## 2012-12-28 DIAGNOSIS — E119 Type 2 diabetes mellitus without complications: Secondary | ICD-10-CM | POA: Diagnosis not present

## 2012-12-28 NOTE — Telephone Encounter (Signed)
Patient Information:  Caller Name: Aerianna  Phone: [phone]  Patient: Chelsey [name]  Gender: Female  DOB: June 08, 1946  Age: 67 Years  PCP: Berniece Andreas (Family Practice)  Office Follow Up:  Does the office need to follow up with this patient?: Yes  Instructions For The Office: Please contact patient Home Bound. Post knee replacement. [name] working with patient  RN NoteAudiological scientist is her pharmacy.  PLEASE CONTACT AND ADVISE WHAT YOU WOULD LIKE TO HAVE DONE. [phone]  Symptoms  Reason For Call & Symptoms: Patient contacted the office on Friday 12/24/12 for UTI. She is currently recovering from a knee replacement and cannot get out. The office called her in Septra DS one po bid x days.  She completed the medication on Monday , 12/27/12. She states today she feels like it is returning. She does not believe the medication was long enough. (#2) If you would like a specimen [name] will collect and have it brought in .  You need to contact [facility] make a "nurse request" [phone] Leonel Ramsay.   She is able to urinate .Last output 5 minutes ago. Discomfort at end of stream, urine is cloudy.  Patient remains on Xarelto blood thinner post op.  Reviewed Health History In EMR: Yes  Reviewed Medications In EMR: Yes  Reviewed Allergies In EMR: Yes  Reviewed Surgeries / Procedures: Yes  Date of Onset of Symptoms: 12/24/2012  Treatments Tried: Septra  Treatments Tried Worked: Yes  Guideline(s) Used:  Urination Pain - Female  Disposition Per Guideline:   See Today in Office  Reason For Disposition Reached:   Taking antibiotic > 3 days for UTI and painful urination not improved  Advice Given:  N/A  RN Overrode Recommendation:  Patient Requests Prescription  PATIENT IS NEEDING ASSISTANCE WITH LINGERING UTI. she is currently home bound with Knee replacement. [name] can collect u/a and culture if you contact them for a Nurse .

## 2012-12-29 DIAGNOSIS — E119 Type 2 diabetes mellitus without complications: Secondary | ICD-10-CM | POA: Diagnosis not present

## 2012-12-29 DIAGNOSIS — Z96659 Presence of unspecified artificial knee joint: Secondary | ICD-10-CM | POA: Diagnosis not present

## 2012-12-29 DIAGNOSIS — N390 Urinary tract infection, site not specified: Secondary | ICD-10-CM | POA: Diagnosis not present

## 2012-12-29 DIAGNOSIS — IMO0001 Reserved for inherently not codable concepts without codable children: Secondary | ICD-10-CM | POA: Diagnosis not present

## 2012-12-29 DIAGNOSIS — M1710 Unilateral primary osteoarthritis, unspecified knee: Secondary | ICD-10-CM | POA: Diagnosis not present

## 2012-12-29 DIAGNOSIS — Z471 Aftercare following joint replacement surgery: Secondary | ICD-10-CM | POA: Diagnosis not present

## 2012-12-29 NOTE — Telephone Encounter (Signed)
Called and spoke to [place].  They will do a UA and culture.  Specimen to be collected today and sent to [facility].  Pt notified.

## 2012-12-29 NOTE — Telephone Encounter (Signed)
Please see if [facility] can do a urinalysis and  urnioe culture tests  On a clean catch urine ( As per CAN )  3 day should have been enough we may need to change med .

## 2012-12-30 DIAGNOSIS — IMO0001 Reserved for inherently not codable concepts without codable children: Secondary | ICD-10-CM | POA: Diagnosis not present

## 2012-12-30 DIAGNOSIS — Z96659 Presence of unspecified artificial knee joint: Secondary | ICD-10-CM | POA: Diagnosis not present

## 2012-12-30 DIAGNOSIS — M1710 Unilateral primary osteoarthritis, unspecified knee: Secondary | ICD-10-CM | POA: Diagnosis not present

## 2012-12-30 DIAGNOSIS — Z471 Aftercare following joint replacement surgery: Secondary | ICD-10-CM | POA: Diagnosis not present

## 2012-12-30 DIAGNOSIS — E119 Type 2 diabetes mellitus without complications: Secondary | ICD-10-CM | POA: Diagnosis not present

## 2012-12-31 DIAGNOSIS — M1710 Unilateral primary osteoarthritis, unspecified knee: Secondary | ICD-10-CM | POA: Diagnosis not present

## 2012-12-31 DIAGNOSIS — IMO0001 Reserved for inherently not codable concepts without codable children: Secondary | ICD-10-CM | POA: Diagnosis not present

## 2012-12-31 DIAGNOSIS — Z96659 Presence of unspecified artificial knee joint: Secondary | ICD-10-CM | POA: Diagnosis not present

## 2012-12-31 DIAGNOSIS — Z471 Aftercare following joint replacement surgery: Secondary | ICD-10-CM | POA: Diagnosis not present

## 2012-12-31 DIAGNOSIS — E119 Type 2 diabetes mellitus without complications: Secondary | ICD-10-CM | POA: Diagnosis not present

## 2013-01-01 ENCOUNTER — Telehealth: Payer: Self-pay | Admitting: *Deleted

## 2013-01-01 ENCOUNTER — Encounter: Payer: Self-pay | Admitting: Oncology

## 2013-01-01 NOTE — Telephone Encounter (Signed)
Lm gv pt appt d/t, and info for [facility].

## 2013-01-03 DIAGNOSIS — Z471 Aftercare following joint replacement surgery: Secondary | ICD-10-CM | POA: Diagnosis not present

## 2013-01-03 DIAGNOSIS — E119 Type 2 diabetes mellitus without complications: Secondary | ICD-10-CM | POA: Diagnosis not present

## 2013-01-03 DIAGNOSIS — M1710 Unilateral primary osteoarthritis, unspecified knee: Secondary | ICD-10-CM | POA: Diagnosis not present

## 2013-01-03 DIAGNOSIS — Z96659 Presence of unspecified artificial knee joint: Secondary | ICD-10-CM | POA: Diagnosis not present

## 2013-01-03 DIAGNOSIS — IMO0001 Reserved for inherently not codable concepts without codable children: Secondary | ICD-10-CM | POA: Diagnosis not present

## 2013-01-04 ENCOUNTER — Other Ambulatory Visit: Payer: Self-pay | Admitting: Family Medicine

## 2013-01-04 DIAGNOSIS — M1710 Unilateral primary osteoarthritis, unspecified knee: Secondary | ICD-10-CM | POA: Diagnosis not present

## 2013-01-04 DIAGNOSIS — Z96659 Presence of unspecified artificial knee joint: Secondary | ICD-10-CM | POA: Diagnosis not present

## 2013-01-04 DIAGNOSIS — IMO0001 Reserved for inherently not codable concepts without codable children: Secondary | ICD-10-CM | POA: Diagnosis not present

## 2013-01-04 DIAGNOSIS — E119 Type 2 diabetes mellitus without complications: Secondary | ICD-10-CM | POA: Diagnosis not present

## 2013-01-04 DIAGNOSIS — D62 Acute posthemorrhagic anemia: Secondary | ICD-10-CM

## 2013-01-04 DIAGNOSIS — Z471 Aftercare following joint replacement surgery: Secondary | ICD-10-CM | POA: Diagnosis not present

## 2013-01-04 MED ORDER — NITROFURANTOIN MONOHYD MACRO 100 MG PO CAPS: 100.0000 mg | ORAL_CAPSULE | Freq: Two times a day (BID) | ORAL | Status: DC

## 2013-01-05 DIAGNOSIS — E119 Type 2 diabetes mellitus without complications: Secondary | ICD-10-CM | POA: Diagnosis not present

## 2013-01-05 DIAGNOSIS — IMO0001 Reserved for inherently not codable concepts without codable children: Secondary | ICD-10-CM | POA: Diagnosis not present

## 2013-01-05 DIAGNOSIS — Z96659 Presence of unspecified artificial knee joint: Secondary | ICD-10-CM | POA: Diagnosis not present

## 2013-01-05 DIAGNOSIS — Z471 Aftercare following joint replacement surgery: Secondary | ICD-10-CM | POA: Diagnosis not present

## 2013-01-05 DIAGNOSIS — M1710 Unilateral primary osteoarthritis, unspecified knee: Secondary | ICD-10-CM | POA: Diagnosis not present

## 2013-01-06 ENCOUNTER — Telehealth: Payer: Self-pay | Admitting: Orthopedic Surgery

## 2013-01-06 DIAGNOSIS — IMO0001 Reserved for inherently not codable concepts without codable children: Secondary | ICD-10-CM | POA: Diagnosis not present

## 2013-01-06 DIAGNOSIS — M1710 Unilateral primary osteoarthritis, unspecified knee: Secondary | ICD-10-CM | POA: Diagnosis not present

## 2013-01-06 DIAGNOSIS — Z96659 Presence of unspecified artificial knee joint: Secondary | ICD-10-CM | POA: Diagnosis not present

## 2013-01-06 DIAGNOSIS — Z471 Aftercare following joint replacement surgery: Secondary | ICD-10-CM | POA: Diagnosis not present

## 2013-01-06 DIAGNOSIS — E119 Type 2 diabetes mellitus without complications: Secondary | ICD-10-CM | POA: Diagnosis not present

## 2013-01-10 DIAGNOSIS — M1710 Unilateral primary osteoarthritis, unspecified knee: Secondary | ICD-10-CM | POA: Diagnosis not present

## 2013-01-10 DIAGNOSIS — R262 Difficulty in walking, not elsewhere classified: Secondary | ICD-10-CM | POA: Diagnosis not present

## 2013-01-13 DIAGNOSIS — M1710 Unilateral primary osteoarthritis, unspecified knee: Secondary | ICD-10-CM | POA: Diagnosis not present

## 2013-01-13 DIAGNOSIS — R262 Difficulty in walking, not elsewhere classified: Secondary | ICD-10-CM | POA: Diagnosis not present

## 2013-01-17 ENCOUNTER — Encounter: Payer: Self-pay | Admitting: Internal Medicine

## 2013-01-17 DIAGNOSIS — R262 Difficulty in walking, not elsewhere classified: Secondary | ICD-10-CM | POA: Diagnosis not present

## 2013-01-17 DIAGNOSIS — M1710 Unilateral primary osteoarthritis, unspecified knee: Secondary | ICD-10-CM | POA: Diagnosis not present

## 2013-01-19 DIAGNOSIS — M1710 Unilateral primary osteoarthritis, unspecified knee: Secondary | ICD-10-CM | POA: Diagnosis not present

## 2013-01-19 DIAGNOSIS — R262 Difficulty in walking, not elsewhere classified: Secondary | ICD-10-CM | POA: Diagnosis not present

## 2013-01-20 DIAGNOSIS — R262 Difficulty in walking, not elsewhere classified: Secondary | ICD-10-CM | POA: Diagnosis not present

## 2013-01-20 DIAGNOSIS — M1710 Unilateral primary osteoarthritis, unspecified knee: Secondary | ICD-10-CM | POA: Diagnosis not present

## 2013-01-21 ENCOUNTER — Ambulatory Visit (INDEPENDENT_AMBULATORY_CARE_PROVIDER_SITE_OTHER): Payer: Medicare Other | Admitting: Internal Medicine

## 2013-01-21 ENCOUNTER — Encounter: Payer: Self-pay | Admitting: Internal Medicine

## 2013-01-21 ENCOUNTER — Ambulatory Visit: Payer: Medicare Other | Admitting: Internal Medicine

## 2013-01-21 ENCOUNTER — Telehealth: Payer: Self-pay | Admitting: Internal Medicine

## 2013-01-21 VITALS — BP 112/68 | HR 82 | Temp 98.6°F | Wt 196.0 lb

## 2013-01-21 DIAGNOSIS — R300 Dysuria: Secondary | ICD-10-CM | POA: Diagnosis not present

## 2013-01-21 DIAGNOSIS — G479 Sleep disorder, unspecified: Secondary | ICD-10-CM | POA: Diagnosis not present

## 2013-01-21 LAB — POCT URINALYSIS DIPSTICK
Nitrite, UA: NEGATIVE
Protein, UA: NEGATIVE
pH, UA: 5.5

## 2013-01-21 MED ORDER — CEFUROXIME AXETIL 500 MG PO TABS: 500.0000 mg | ORAL_TABLET | Freq: Two times a day (BID) | ORAL | Status: DC

## 2013-01-21 MED ORDER — ALPRAZOLAM 0.25 MG PO TABS: 0.2500 mg | ORAL_TABLET | Freq: Every evening | ORAL | Status: DC | PRN

## 2013-01-21 NOTE — Telephone Encounter (Signed)
Pt had UTI w/ ecoli a few weeks ago. Results sent to Dr Fabian Sharp.  Pt is concerned she has another starting and would like to know if she could come in today and give a UA asap.  Pt refused appt.  Pls advise.

## 2013-01-21 NOTE — Telephone Encounter (Signed)
Appt scheduled for today.

## 2013-01-21 NOTE — Progress Notes (Signed)
Chief Complaint  Patient presents with  . Dysuria    HPI: Patient comes in today for SDA for  new problem evaluation. He is recovering from a total knee replacement on the left doing as expected. However today was having some mild dysuria. This is important because a few weeks ago while she was post operative she had dysuria with negative UA screen but positive UTI Escherichia coli culture. At that time she was treated with Macrobid based on sensitivities. The original sulfa medicine prescribed was not helpful.    Her symptoms resolved however because she had some mild symptoms today and the weekend is coming out she needed to come in to get checked.  No fever or flank pain other systemic symptoms aches and pains as before  Would also like a refill of the Xanax she is using it at night to help her with falling asleep many stresses. But doing okay  ROS: See pertinent positives and negatives per HPI.  Past Medical History  Diagnosis Date  . Diabetes mellitus   . Ulcer   . Allergy   . Arthritis     osteoarthritis  . Hypertension   . Hyperlipidemia   . GERD (gastroesophageal reflux disease)   . Pneumonia 07/28/2011  . Sleep apnea     setting of 10  . Cancer     breast    Family History  Problem Relation Age of Onset  . Alzheimer's disease Mother   . Sleep apnea Mother   . Diabetes Mother   . Hypertension Mother   . Hyperlipidemia Mother   . Breast cancer Mother 19    triple negative  . Anxiety disorder Daughter   . Hypertension Son     pulmonary  . Congenital heart disease Son 0    TGA  died age 71   . Lung disease Sister     History   Social History  . Marital Status: Married    Spouse Name: N/A    Number of Children: N/A  . Years of Education: N/A   Occupational History  . realtor    Social History Main Topics  . Smoking status: Never Smoker   . Smokeless tobacco: None  . Alcohol Use: 1.2 oz/week    2 Glasses of wine per week  . Drug Use: No  .  Sexually Active: None   Other Topics Concern  . 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    Outpatient Encounter Prescriptions as of 01/21/2013  Medication Sig Dispense Refill  . ALPRAZolam (XANAX) 0.25 MG tablet Take 1 tablet (0.25 mg total) by mouth at bedtime as needed for sleep or anxiety.  40 tablet  0  . anastrozole (ARIMIDEX) 1 MG tablet Take 1 tablet (1 mg total) by mouth daily.  30 tablet  7  . CRESTOR 10 MG tablet take 1 tablet by mouth once daily  30 tablet  4  . cycloSPORINE (RESTASIS) 0.05 % ophthalmic emulsion Place 1 drop into both eyes daily.      . fluticasone (FLONASE) 50 MCG/ACT nasal spray Place 2 sprays into the nose daily as needed for rhinitis or allergies.      Marland Kitchen HYDROmorphone (DILAUDID) 2 MG tablet Take 1-2 tablets (2-4 mg total) by mouth every 4 (four) hours as needed.  80 tablet  0  .  losartan (COZAAR) 50 MG tablet Take 50 mg by mouth at bedtime.      . metFORMIN (GLUMETZA) 500 MG (MOD) 24 hr tablet Take 500-1,000 mg by mouth 2 (two) times daily. 2 tablets with breakfast and 1 tablet with dinner      . methocarbamol (ROBAXIN) 500 MG tablet Take 1 tablet (500 mg total) by mouth every 6 (six) hours as needed.  80 tablet  0  . omeprazole (PRILOSEC) 20 MG capsule Take 20 mg by mouth daily.      . sertraline (ZOLOFT) 25 MG tablet Take 25 mg by mouth at bedtime.       . traMADol (ULTRAM) 50 MG tablet Take 1-2 tablets (50-100 mg total) by mouth every 6 (six) hours as needed.  60 tablet  0  . [DISCONTINUED] ALPRAZolam (XANAX) 0.25 MG tablet Take 1 tablet (0.25 mg total) by mouth at bedtime as needed for sleep or anxiety.  40 tablet  0  . cefUROXime (CEFTIN) 500 MG tablet Take 1 tablet (500 mg total) by mouth 2 (two) times daily.  10 tablet  0  . [DISCONTINUED] nitrofurantoin, macrocrystal-monohydrate, (MACROBID) 100 MG capsule Take 1 capsule (100  mg total) by mouth 2 (two) times daily.  10 capsule  0  . [DISCONTINUED] rivaroxaban (XARELTO) 10 MG TABS tablet Take 1 tablet (10 mg total) by mouth daily with breakfast. Take Xarelto for two and a half more weeks, then discontinue Xarelto.  19 tablet  0  . [DISCONTINUED] sulfamethoxazole-trimethoprim (BACTRIM DS,SEPTRA DS) 800-160 MG per tablet Take 1 tablet by mouth 2 (two) times daily.  6 tablet  0   No facility-administered encounter medications on file as of 01/21/2013.    EXAM:  BP 112/68  Pulse 82  Temp(Src) 98.6 F (37 C) (Oral)  Wt 196 lb (88.905 kg)  BMI 33.63 kg/m2  SpO2 97%  Body mass index is 33.63 kg/(m^2).  GENERAL: vitals reviewed and listed above, alert, oriented, appears well hydrated and in no acute distress   MS: moves all extremities limps a bit with the left knee can bend it to about 90.  PSYCH: pleasant and cooperative, no obvious depression or anxiety UA shows trace leukocytes 1+ blood reviewed tE HR  had Escherichia coli that was resistant to Septra and penicillin previous ASSESSMENT AND PLAN:  Discussed the following assessment and plan:  Dysuria - recnet hx of uti post op - Plan: POC Urinalysis Dipstick  Sleep disturbance - refill xanax fo now May be not a UTI continue fluids prescription given for Ceftin if needed over the weekend if symptoms progress. -Patient advised to return or notify health care team  if symptoms worsen or persist or new concerns arise.  Patient Instructions  Urine test has minimal amount of  Cells in it.     If  Having   More uti sx begin antibiotic and let us know.   Xanax   As needed.    Chelsey [name]. Chelsey M.D.

## 2013-01-21 NOTE — Patient Instructions (Addendum)
Urine test has minimal amount of  Cells in it.     If  Having   More uti sx begin antibiotic and let us know.   Xanax   As needed.

## 2013-01-24 DIAGNOSIS — R262 Difficulty in walking, not elsewhere classified: Secondary | ICD-10-CM | POA: Diagnosis not present

## 2013-01-24 DIAGNOSIS — M1710 Unilateral primary osteoarthritis, unspecified knee: Secondary | ICD-10-CM | POA: Diagnosis not present

## 2013-01-26 DIAGNOSIS — Z96659 Presence of unspecified artificial knee joint: Secondary | ICD-10-CM | POA: Diagnosis not present

## 2013-01-27 DIAGNOSIS — M1710 Unilateral primary osteoarthritis, unspecified knee: Secondary | ICD-10-CM | POA: Diagnosis not present

## 2013-01-27 DIAGNOSIS — R262 Difficulty in walking, not elsewhere classified: Secondary | ICD-10-CM | POA: Diagnosis not present

## 2013-01-28 DIAGNOSIS — R262 Difficulty in walking, not elsewhere classified: Secondary | ICD-10-CM | POA: Diagnosis not present

## 2013-01-28 DIAGNOSIS — M1710 Unilateral primary osteoarthritis, unspecified knee: Secondary | ICD-10-CM | POA: Diagnosis not present

## 2013-01-31 DIAGNOSIS — M1710 Unilateral primary osteoarthritis, unspecified knee: Secondary | ICD-10-CM | POA: Diagnosis not present

## 2013-01-31 DIAGNOSIS — R262 Difficulty in walking, not elsewhere classified: Secondary | ICD-10-CM | POA: Diagnosis not present

## 2013-02-02 DIAGNOSIS — Z471 Aftercare following joint replacement surgery: Secondary | ICD-10-CM

## 2013-02-02 DIAGNOSIS — Z96659 Presence of unspecified artificial knee joint: Secondary | ICD-10-CM

## 2013-02-02 DIAGNOSIS — N390 Urinary tract infection, site not specified: Secondary | ICD-10-CM

## 2013-02-03 DIAGNOSIS — R262 Difficulty in walking, not elsewhere classified: Secondary | ICD-10-CM | POA: Diagnosis not present

## 2013-02-03 DIAGNOSIS — M1710 Unilateral primary osteoarthritis, unspecified knee: Secondary | ICD-10-CM | POA: Diagnosis not present

## 2013-02-10 DIAGNOSIS — M1710 Unilateral primary osteoarthritis, unspecified knee: Secondary | ICD-10-CM | POA: Diagnosis not present

## 2013-02-10 DIAGNOSIS — R262 Difficulty in walking, not elsewhere classified: Secondary | ICD-10-CM | POA: Diagnosis not present

## 2013-02-11 DIAGNOSIS — R262 Difficulty in walking, not elsewhere classified: Secondary | ICD-10-CM | POA: Diagnosis not present

## 2013-02-11 DIAGNOSIS — M1710 Unilateral primary osteoarthritis, unspecified knee: Secondary | ICD-10-CM | POA: Diagnosis not present

## 2013-02-14 ENCOUNTER — Other Ambulatory Visit: Payer: Self-pay | Admitting: Internal Medicine

## 2013-02-15 DIAGNOSIS — M1710 Unilateral primary osteoarthritis, unspecified knee: Secondary | ICD-10-CM | POA: Diagnosis not present

## 2013-02-15 DIAGNOSIS — R262 Difficulty in walking, not elsewhere classified: Secondary | ICD-10-CM | POA: Diagnosis not present

## 2013-02-16 NOTE — Telephone Encounter (Signed)
Ok  To refill x 1  

## 2013-02-16 NOTE — Telephone Encounter (Signed)
Last seen and filled on 01/21/13 #60 with 0 refills.  Has an upcoming appt on 03/21/13.   Please advise.  Thanks!!

## 2013-02-17 DIAGNOSIS — R262 Difficulty in walking, not elsewhere classified: Secondary | ICD-10-CM | POA: Diagnosis not present

## 2013-02-17 DIAGNOSIS — M1710 Unilateral primary osteoarthritis, unspecified knee: Secondary | ICD-10-CM | POA: Diagnosis not present

## 2013-02-22 DIAGNOSIS — R262 Difficulty in walking, not elsewhere classified: Secondary | ICD-10-CM | POA: Diagnosis not present

## 2013-02-22 DIAGNOSIS — M1710 Unilateral primary osteoarthritis, unspecified knee: Secondary | ICD-10-CM | POA: Diagnosis not present

## 2013-02-25 DIAGNOSIS — M1710 Unilateral primary osteoarthritis, unspecified knee: Secondary | ICD-10-CM | POA: Diagnosis not present

## 2013-02-25 DIAGNOSIS — R262 Difficulty in walking, not elsewhere classified: Secondary | ICD-10-CM | POA: Diagnosis not present

## 2013-02-28 DIAGNOSIS — R262 Difficulty in walking, not elsewhere classified: Secondary | ICD-10-CM | POA: Diagnosis not present

## 2013-02-28 DIAGNOSIS — M1710 Unilateral primary osteoarthritis, unspecified knee: Secondary | ICD-10-CM | POA: Diagnosis not present

## 2013-03-03 DIAGNOSIS — M1710 Unilateral primary osteoarthritis, unspecified knee: Secondary | ICD-10-CM | POA: Diagnosis not present

## 2013-03-03 DIAGNOSIS — R262 Difficulty in walking, not elsewhere classified: Secondary | ICD-10-CM | POA: Diagnosis not present

## 2013-03-09 DIAGNOSIS — M1710 Unilateral primary osteoarthritis, unspecified knee: Secondary | ICD-10-CM | POA: Diagnosis not present

## 2013-03-09 DIAGNOSIS — R262 Difficulty in walking, not elsewhere classified: Secondary | ICD-10-CM | POA: Diagnosis not present

## 2013-03-10 DIAGNOSIS — M1710 Unilateral primary osteoarthritis, unspecified knee: Secondary | ICD-10-CM | POA: Diagnosis not present

## 2013-03-10 DIAGNOSIS — R262 Difficulty in walking, not elsewhere classified: Secondary | ICD-10-CM | POA: Diagnosis not present

## 2013-03-11 ENCOUNTER — Other Ambulatory Visit (INDEPENDENT_AMBULATORY_CARE_PROVIDER_SITE_OTHER): Payer: Medicare Other

## 2013-03-11 DIAGNOSIS — Z96659 Presence of unspecified artificial knee joint: Secondary | ICD-10-CM | POA: Diagnosis not present

## 2013-03-11 DIAGNOSIS — M25569 Pain in unspecified knee: Secondary | ICD-10-CM | POA: Diagnosis not present

## 2013-03-11 DIAGNOSIS — E119 Type 2 diabetes mellitus without complications: Secondary | ICD-10-CM

## 2013-03-11 LAB — BASIC METABOLIC PANEL
BUN: 13 mg/dL (ref 6–23)
Chloride: 109 mEq/L (ref 96–112)
Creatinine, Ser: 0.8 mg/dL (ref 0.4–1.2)
GFR: 82.2 mL/min (ref >60.00)

## 2013-03-18 ENCOUNTER — Ambulatory Visit: Payer: Medicare Other | Admitting: Internal Medicine

## 2013-03-21 ENCOUNTER — Ambulatory Visit (INDEPENDENT_AMBULATORY_CARE_PROVIDER_SITE_OTHER): Payer: Medicare Other | Admitting: Internal Medicine

## 2013-03-21 ENCOUNTER — Encounter: Payer: Self-pay | Admitting: Internal Medicine

## 2013-03-21 VITALS — BP 130/60 | HR 78 | Temp 98.1°F | Wt 189.0 lb

## 2013-03-21 DIAGNOSIS — M1710 Unilateral primary osteoarthritis, unspecified knee: Secondary | ICD-10-CM

## 2013-03-21 DIAGNOSIS — I10 Essential (primary) hypertension: Secondary | ICD-10-CM

## 2013-03-21 DIAGNOSIS — E785 Hyperlipidemia, unspecified: Secondary | ICD-10-CM | POA: Diagnosis not present

## 2013-03-21 DIAGNOSIS — E119 Type 2 diabetes mellitus without complications: Secondary | ICD-10-CM

## 2013-03-21 DIAGNOSIS — Z569 Unspecified problems related to employment: Secondary | ICD-10-CM

## 2013-03-21 DIAGNOSIS — IMO0002 Reserved for concepts with insufficient information to code with codable children: Secondary | ICD-10-CM

## 2013-03-21 DIAGNOSIS — R945 Abnormal results of liver function studies: Secondary | ICD-10-CM | POA: Insufficient documentation

## 2013-03-21 DIAGNOSIS — R7989 Other specified abnormal findings of blood chemistry: Secondary | ICD-10-CM | POA: Insufficient documentation

## 2013-03-21 DIAGNOSIS — G479 Sleep disorder, unspecified: Secondary | ICD-10-CM

## 2013-03-21 DIAGNOSIS — Z96659 Presence of unspecified artificial knee joint: Secondary | ICD-10-CM

## 2013-03-21 DIAGNOSIS — M179 Osteoarthritis of knee, unspecified: Secondary | ICD-10-CM

## 2013-03-21 DIAGNOSIS — Z96652 Presence of left artificial knee joint: Secondary | ICD-10-CM

## 2013-03-21 DIAGNOSIS — Z566 Other physical and mental strain related to work: Secondary | ICD-10-CM | POA: Insufficient documentation

## 2013-03-21 MED ORDER — SERTRALINE HCL 50 MG PO TABS: 50.0000 mg | ORAL_TABLET | Freq: Every day | ORAL | Status: DC

## 2013-03-21 MED ORDER — ALPRAZOLAM 0.25 MG PO TABS: ORAL_TABLET | ORAL | Status: DC

## 2013-03-21 NOTE — Progress Notes (Signed)
Chief Complaint  Patient presents with  . Follow-up    stress   . Diabetes    HPI: Patient comes in today for follow up of  multiple medical problems.  Mostly blood sugar    taking 1500 mg equivalent of metformin without significant side effects. However a number of things have happened since her last visit  work situation stress business problems unexpected  Not doing as well as possible  Knee is ok but tendons popping and gets tight   Using oxycodone  At night     Taking xanax.  at night for sleep.  And occasionally in the day takes a half to one under this recent stressful time is still taking 25 mg of sertraline  bp up  Today  Stressed has been pretty good  Cancer  Changed to another doc     Dr Donnie Coffin gone.    No unusual bleeding chest pain shortness of breath otherwise   still on Crestor ROS: See pertinent positives and negatives per HPI.  Past Medical History  Diagnosis Date  . Diabetes mellitus   . Ulcer   . Allergy   . Arthritis     osteoarthritis  . Hypertension   . Hyperlipidemia   . GERD (gastroesophageal reflux disease)   . Pneumonia 07/28/2011  . Sleep apnea     setting of 10  . Cancer     breast    Family History  Problem Relation Age of Onset  . Alzheimer's disease Mother   . Sleep apnea Mother   . Diabetes Mother   . Hypertension Mother   . Hyperlipidemia Mother   . Breast cancer Mother 63    triple negative  . Anxiety disorder Daughter   . Hypertension Son     pulmonary  . Congenital heart disease Son 0    TGA  died age 77   . Lung disease Sister     History   Social History  . Marital Status: Married    Spouse Name: N/A    Number of Children: N/A  . Years of Education: N/A   Occupational History  . realtor    Social History Main Topics  . Smoking status: Never Smoker   . Smokeless tobacco: None  . Alcohol Use: 1.2 oz/week    2 Glasses of wine per week  . Drug Use: No  . Sexually Active: None   Other Topics Concern  .  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    Outpatient Encounter Prescriptions as of 03/21/2013  Medication Sig Dispense Refill  . ALPRAZolam (XANAX) 0.25 MG tablet take 1 tablet by mouth at bedtime if needed for sleep or anxiety  40 tablet  0  . anastrozole (ARIMIDEX) 1 MG tablet Take 1 tablet (1 mg total) by mouth daily.  30 tablet  7  . CRESTOR 10 MG tablet take 1 tablet by mouth once daily  30 tablet  4  . cycloSPORINE (RESTASIS) 0.05 % ophthalmic emulsion Place 1 drop into both eyes daily.      . diclofenac (VOLTAREN) 75 MG EC tablet       . losartan (COZAAR) 50 MG tablet Take 50 mg by mouth at bedtime.      . metFORMIN (GLUMETZA) 500 MG (MOD) 24 hr tablet Take 500-1,000 mg by  mouth 2 (two) times daily. 2 tablets with breakfast and 1 tablet with dinner      . omeprazole (PRILOSEC) 20 MG capsule Take 20 mg by mouth daily.      Marland Kitchen oxyCODONE (OXY IR/ROXICODONE) 5 MG immediate release tablet       . [DISCONTINUED] ALPRAZolam (XANAX) 0.25 MG tablet take 1 tablet by mouth at bedtime if needed for sleep or anxiety  40 tablet  0  . [DISCONTINUED] sertraline (ZOLOFT) 25 MG tablet Take 25 mg by mouth at bedtime.       . sertraline (ZOLOFT) 50 MG tablet Take 1 tablet (50 mg total) by mouth daily.  90 tablet  1  . [DISCONTINUED] cefUROXime (CEFTIN) 500 MG tablet Take 1 tablet (500 mg total) by mouth 2 (two) times daily.  10 tablet  0  . [DISCONTINUED] fluticasone (FLONASE) 50 MCG/ACT nasal spray Place 2 sprays into the nose daily as needed for rhinitis or allergies.      . [DISCONTINUED] HYDROmorphone (DILAUDID) 2 MG tablet Take 1-2 tablets (2-4 mg total) by mouth every 4 (four) hours as needed.  80 tablet  0  . [DISCONTINUED] methocarbamol (ROBAXIN) 500 MG tablet Take 1 tablet (500 mg total) by mouth every 6 (six) hours as needed.  80 tablet  0  . [DISCONTINUED] traMADol  (ULTRAM) 50 MG tablet Take 1-2 tablets (50-100 mg total) by mouth every 6 (six) hours as needed.  60 tablet  0   No facility-administered encounter medications on file as of 03/21/2013.    EXAM:  BP 130/60  Pulse 78  Temp(Src) 98.1 F (36.7 C) (Oral)  Wt 189 lb (85.73 kg)  BMI 32.43 kg/m2  SpO2 98%  Body mass index is 32.43 kg/(m^2).  GENERAL: vitals reviewed and listed above, alert, oriented, appears well hydrated and in no acute distress cognitively intact  HEENT: atraumatic, conjunctiva  clear, no obvious abnormalities on inspection of external nose and ears  NECK: no obvious masses on inspection palpation   CV: HRRR, no clubbing cyanosis or  peripheral edema   MS: moves all extremities without noticeable focal  abnormality left knee slightly stiff walks with slightly antalgic gait No unusual bruising or bleeding PSYCH: pleasant and cooperative, no obvious depression or anxiety somewhat stressed. Lab Results  Component Value Date   WBC 9.3 12/22/2012   HGB 10.0* 12/22/2012   HCT 28.6* 12/22/2012   PLT 173 12/22/2012   GLUCOSE 178* 03/11/2013   CHOL 168 11/05/2012   TRIG 132.0 11/05/2012   HDL 60.30 11/05/2012   LDLDIRECT 101.5 07/24/2011   LDLCALC 81 11/05/2012   ALT 45* 12/15/2012   AST 39* 12/15/2012   NA 144 03/11/2013   K 4.8 03/11/2013   CL 109 03/11/2013   CREATININE 0.8 03/11/2013   BUN 13 03/11/2013   CO2 26 03/11/2013   TSH 1.58 11/05/2012   INR 0.93 12/15/2012   HGBA1C 6.8* 03/11/2013   MICROALBUR 0.6 02/14/2009   Reviewed recent laboratory studies ASSESSMENT AND PLAN:  Discussed the following assessment and plan:  Diabetes mellitus, type 2 - Improved control on metformin no obvious side effects other intervening issues will followup in about 3-4 months  HYPERTENSION - recheck  and monitor   HYPERLIPIDEMIA  Degenerative joint disease of knee  S/P TKR (total knee replacement), left  Stress at work  Sleep disturbance, unspecified - Situational seems related to  external factors  Abnormal LFTs Anemia again postoperatively we'll plan followup if not done at oncology this was probably. Operatively  related lfts  were abnormal post operatively on multiple medications. Will repeat at next labs consider ultrasound if persistent. Increase the sertraline 50 mg a day risk-benefit of Xanax discussed she's very careful about this usually takes a half a pill expectant management. Labs and followup in 3 months or as needed. -Patient advised to return or notify health care team  if symptoms worsen or persist or new concerns arise.  Patient Instructions  Advise increase the sertraline to  50 mg   .   Hopefully can improve  Things in the next month Xanax   Is dependent producing .   But ok to  use as needed .  Your blood sugars is some better.   Need to repeat  liver tests at next blood draw  .  Many meds could do this .    Labs in 3 months    And then OV .      Neta Mends. Murline Weigel M.D.  Total visit > 50% spent counseling and coordinating care

## 2013-03-21 NOTE — Patient Instructions (Addendum)
Advise increase the sertraline to  50 mg   .   Hopefully can improve  Things in the next month Xanax   Is dependent producing .   But ok to  use as needed .  Your blood sugars is some better.   Need to repeat  liver tests at next blood draw  .  Many meds could do this .    Labs in 3 months    And then OV .

## 2013-03-22 DIAGNOSIS — M1710 Unilateral primary osteoarthritis, unspecified knee: Secondary | ICD-10-CM | POA: Diagnosis not present

## 2013-03-22 DIAGNOSIS — R262 Difficulty in walking, not elsewhere classified: Secondary | ICD-10-CM | POA: Diagnosis not present

## 2013-03-25 DIAGNOSIS — Z85828 Personal history of other malignant neoplasm of skin: Secondary | ICD-10-CM | POA: Diagnosis not present

## 2013-03-25 DIAGNOSIS — L919 Hypertrophic disorder of the skin, unspecified: Secondary | ICD-10-CM | POA: Diagnosis not present

## 2013-03-25 DIAGNOSIS — D1801 Hemangioma of skin and subcutaneous tissue: Secondary | ICD-10-CM | POA: Diagnosis not present

## 2013-03-25 DIAGNOSIS — L909 Atrophic disorder of skin, unspecified: Secondary | ICD-10-CM | POA: Diagnosis not present

## 2013-03-25 DIAGNOSIS — L821 Other seborrheic keratosis: Secondary | ICD-10-CM | POA: Diagnosis not present

## 2013-03-30 ENCOUNTER — Other Ambulatory Visit: Payer: Self-pay

## 2013-03-30 ENCOUNTER — Other Ambulatory Visit (HOSPITAL_BASED_OUTPATIENT_CLINIC_OR_DEPARTMENT_OTHER): Payer: Medicare Other | Admitting: Lab

## 2013-03-30 DIAGNOSIS — E559 Vitamin D deficiency, unspecified: Secondary | ICD-10-CM

## 2013-03-30 DIAGNOSIS — C50919 Malignant neoplasm of unspecified site of unspecified female breast: Secondary | ICD-10-CM | POA: Diagnosis not present

## 2013-03-30 DIAGNOSIS — M1990 Unspecified osteoarthritis, unspecified site: Secondary | ICD-10-CM | POA: Diagnosis not present

## 2013-03-30 DIAGNOSIS — M8580 Other specified disorders of bone density and structure, unspecified site: Secondary | ICD-10-CM

## 2013-03-30 DIAGNOSIS — M1710 Unilateral primary osteoarthritis, unspecified knee: Secondary | ICD-10-CM | POA: Diagnosis not present

## 2013-03-30 DIAGNOSIS — Z170 Estrogen receptor positive status [ER+]: Secondary | ICD-10-CM | POA: Diagnosis not present

## 2013-03-30 DIAGNOSIS — Z780 Asymptomatic menopausal state: Secondary | ICD-10-CM

## 2013-03-30 LAB — COMPREHENSIVE METABOLIC PANEL (CC13)
ALT: 16 U/L (ref 0–55)
AST: 16 U/L (ref 5–34)
Alkaline Phosphatase: 95 U/L (ref 40–150)
Calcium: 10 mg/dL (ref 8.4–10.4)
Chloride: 105 mEq/L (ref 98–107)
Creatinine: 0.7 mg/dL (ref 0.6–1.1)
Total Bilirubin: 0.51 mg/dL (ref 0.20–1.20)

## 2013-03-30 LAB — CBC WITH DIFFERENTIAL/PLATELET
BASO%: 0.8 % (ref 0.0–2.0)
EOS%: 2.2 % (ref 0.0–7.0)
LYMPH%: 28.8 % (ref 14.0–49.7)
MCHC: 34.5 g/dL (ref 31.5–36.0)
MONO#: 0.3 10*3/uL (ref 0.1–0.9)
Platelets: 206 10*3/uL (ref 145–400)
RBC: 4.46 10*6/uL (ref 3.70–5.45)
WBC: 5.5 10*3/uL (ref 3.9–10.3)

## 2013-03-30 NOTE — Progress Notes (Signed)
Diagnosis code for vit D level today per Dr. Darnelle Catalan, is 268.9 unspecified vitamin D deficiency.

## 2013-03-31 LAB — VITAMIN D 25 HYDROXY (VIT D DEFICIENCY, FRACTURES): Vit D, 25-Hydroxy: 50 ng/mL (ref 30–89)

## 2013-04-06 ENCOUNTER — Ambulatory Visit (HOSPITAL_BASED_OUTPATIENT_CLINIC_OR_DEPARTMENT_OTHER): Payer: Medicare Other | Admitting: Family

## 2013-04-06 ENCOUNTER — Telehealth: Payer: Self-pay | Admitting: Oncology

## 2013-04-06 ENCOUNTER — Encounter: Payer: Self-pay | Admitting: Family

## 2013-04-06 ENCOUNTER — Ambulatory Visit: Payer: Medicare Other | Admitting: Oncology

## 2013-04-06 VITALS — BP 127/76 | HR 83 | Temp 99.1°F | Resp 20 | Ht 64.0 in | Wt 193.0 lb

## 2013-04-06 DIAGNOSIS — C50412 Malignant neoplasm of upper-outer quadrant of left female breast: Secondary | ICD-10-CM

## 2013-04-06 DIAGNOSIS — Z853 Personal history of malignant neoplasm of breast: Secondary | ICD-10-CM | POA: Diagnosis not present

## 2013-04-06 DIAGNOSIS — C50419 Malignant neoplasm of upper-outer quadrant of unspecified female breast: Secondary | ICD-10-CM | POA: Diagnosis not present

## 2013-04-06 DIAGNOSIS — C50919 Malignant neoplasm of unspecified site of unspecified female breast: Secondary | ICD-10-CM | POA: Diagnosis not present

## 2013-04-06 DIAGNOSIS — C50912 Malignant neoplasm of unspecified site of left female breast: Secondary | ICD-10-CM

## 2013-04-06 MED ORDER — ANASTROZOLE 1 MG PO TABS: 1.0000 mg | ORAL_TABLET | Freq: Every day | ORAL | Status: DC

## 2013-04-06 NOTE — Telephone Encounter (Signed)
Pt on recall for dr Davina Poke as they cannot sch yet for November,pt aware

## 2013-04-06 NOTE — Progress Notes (Addendum)
[facility]  [phone] [phone]  OFFICE PROGRESS NOTE   ID: Chelsey [name]   DOB: 1945/12/07  MR#: [id number]  JYN#:[id number]   PCP: Chelsey Harp, MD GYN:  SU: Chelsey [name], M.D. PLA SU: Chelsey [name], M.D.  RAD ONC: Chelsey [name], M.D.   HISTORY OF PRESENT ILLNESS: From Dr. Theron Arista [name]'s new patient evaluation note dated 04/16/2009: "This is a pleasant 67 year old woman who is seen with her husband, Chelsey [name], today for evaluation and treatment of breast cancer. This is a pleasant woman who is from [place].  She has had annual mammograms till about 2006 when she has had multiple other things going on that has precluded her from having mammogram.  She did notice a mass in her outer aspect of the left breast about a month ago.  She sought medical attention for this.  She did have a mammogram on 04/04/2009 following a biopsy prior to that.  A mammogram was performed with an ultrasound showing a mass measuring 2.9 x 2.3 cm.  A biopsy was recommended.  Biopsy performed on the same day showed a low-grade invasive ductal cancer, strongly ER and PR positive at 99% respectively.  HER-2 was 0.  Proliferative index was 30%.  The patient subsequently had an MRI scan of both breasts on 04/11/2009 and the left breast showed multiple rounded confluent enhancing masses centering the lateral aspect of the left breast.  This involved all 4 quadrants of the breast measuring 9.5 x 5.5 x 5.3 cm.  No evidence of malignancy was seen.  She subsequently was referred to Dr. Luisa [name], who has tentatively scheduled her for lumpectomy and then since I sent her for consideration of neoadjuvant therapy after the results of the MRI were reviewed."  Her subsequent history is as detailed below.   INTERVAL HISTORY: Dr. Darnelle [name] and I saw Chelsey [name] today for followup of invasive ductal carcinoma of the left breast.  The patient was last seen by Dr. Donnie [name] on 09/06/2012.  Since her  last office visit, the patient has been doing relatively well.  Her interval history is significant for having left knee arthroscopy in 12/2012.  She states she is due to have right knee arthroscopy this winter.  She is establishing herself with Dr. Darrall [name] service today.   REVIEW OF SYSTEMS: A 10 point review of systems was completed and is negative except chronic arthralgias and myalgias, left upper extremity lymphedema, and vaginal dryness.  The patient states it's hard to differentiate any discomfort she may be having from Arimidex from her ongoing chronic arthralgias and myalgias.  Chelsey [name] denies any other symptomatology.   PAST MEDICAL HISTORY: Past Medical History  Diagnosis Date  . Diabetes mellitus   . Allergy   . Arthritis     osteoarthritis  . Hypertension   . Hyperlipidemia   . GERD (gastroesophageal reflux disease)   . Pneumonia 07/28/2011  . Sleep apnea     setting of 10  . Cancer     breast  . Colitis   . Sinusitis   . Ulcer     Oral ulcers durin chemotherapy in 2010  . Carpal tunnel syndrome of right wrist   . Squamous cell carcinoma     left leg    PAST SURGICAL HISTORY: Past Surgical History  Procedure Laterality Date  . Cesarean section    . Breast surgery  11/23    left mastectomy  . Reconstruction breast immediate / delayed w/ tissue expander  Left breast  . Skin graft    . Tonsillectomy      as child  . Total knee arthroplasty Left 12/20/2012    Procedure: TOTAL KNEE ARTHROPLASTY;  Surgeon: Chelsey Drilling, MD;  Location: [facility];  Service: Orthopedics;  Laterality: Left;  . Tubal ligation    . Squamous cell carcinoma excision Left     Left lower extremity  Past surgical history includes previous biopsy of left breast in 1980 for a cystic area.   FAMILY HISTORY Family History  Problem Relation Age of Onset  . Alzheimer's disease Mother   . Sleep apnea Mother   . Diabetes Mother   . Hypertension Mother   . Hyperlipidemia Mother   .  Breast cancer Mother 32    triple negative  . Anxiety disorder Daughter   . Hypertension Son     pulmonary  . Congenital heart disease Son 0    TGA  died age 58   She is the only child.  Mother is still living and resides in a nursing home.  Her mother has history of Alzheimer's.  Father died at age 73 from a motor vehicle accident.  There is no other history of breast or ovarian cancer in the family.   GYNECOLOGIC HISTORY: She is G3, P3, menarche age 77, menopause age 39.  No significant hormone replacement therapy.   SOCIAL HISTORY: Chelsey [name] and her husband on have been married since 1968.  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  They have 2 adult children, one son and one daughter.  2 children age 41 and 76.  She has one son that passed away from congenital heart disease at age of 62.  There older son is divorced and has a 14-year-old daughter.  Her older son resides with Chelsey [name] in her home.  Her granddaughter resides mostly in [place], [place].  Chelsey [name] has 2 dogs and a rabbit.  In her spare time she enjoys swimming, dining out with friends, and writing her indoor recumbent bicycle.    ADVANCED DIRECTIVES: Not on file   HEALTH MAINTENANCE: History  Substance Use Topics  . Smoking status: Never Smoker   . Smokeless tobacco: Never Used  . Alcohol Use: 3.6 oz/week    6 Glasses of wine per week    Colonoscopy: 10/13/2002 (colonoscopy is currently past due) PAP: 08/15/2011 Bone density: The patient's last bone density scan on 08/13/2012 showed a T score of -2.0 (osteopenia). Lipid panel: Not on file  Allergies  Allergen Reactions  . Amoxicillin     REACTION: unspecified  . Lipitor (Atorvastatin Calcium)     Leg cramps  . Penicillins     As child  . Prednisone Itching  . Latex Rash    Current Outpatient Prescriptions  Medication Sig Dispense Refill  . alendronate (FOSAMAX) 35 MG tablet Take 35 mg by mouth every 7  (seven) days. Take with a full glass of water on an empty stomach.      . ALPRAZolam (XANAX) 0.25 MG tablet take 1 tablet by mouth at bedtime if needed for sleep or anxiety  40 tablet  0  . anastrozole (ARIMIDEX) 1 MG tablet Take 1 tablet (1 mg total) by mouth daily.  90 tablet  5  . b complex vitamins capsule Take 1 capsule by mouth daily.      . Calcium Carbonate-Vitamin D (CALTRATE 600+D PO) Take 1 tablet by mouth daily.      Marland Kitchen  Cholecalciferol (VITAMIN D-3) 1000 UNITS CAPS Take 1 capsule by mouth daily.      . CRESTOR 10 MG tablet take 1 tablet by mouth once daily  30 tablet  4  . cycloSPORINE (RESTASIS) 0.05 % ophthalmic emulsion Place 1 drop into both eyes daily.      . diclofenac (VOLTAREN) 75 MG EC tablet Take 75 mg by mouth as needed.       Marland Kitchen losartan (COZAAR) 50 MG tablet Take 50 mg by mouth at bedtime.      . metFORMIN (GLUMETZA) 500 MG (MOD) 24 hr tablet Take 1,500 mg by mouth daily. 2 tablets with breakfast (1000 mg)  and 1 tablet with dinner (500 mg) = 1500 per day      . omeprazole (PRILOSEC) 20 MG capsule Take 20 mg by mouth daily.      Marland Kitchen oxyCODONE (OXY IR/ROXICODONE) 5 MG immediate release tablet Take 5 mg by mouth every 8 (eight) hours as needed.       . sertraline (ZOLOFT) 50 MG tablet Take 1 tablet (50 mg total) by mouth daily.  90 tablet  1   No current facility-administered medications for this visit.    OBJECTIVE: Filed Vitals:   04/06/13 1032  BP: 127/76  Pulse: 83  Temp: 99.1 F (37.3 C)  Resp: 20     Body mass index is 33.11 kg/(m^2).      ECOG FS: 1 - Symptomatic but completely ambulatory  General appearance: Alert, cooperative, well nourished, no apparent distress Head: Normocephalic, without obvious abnormality, atraumatic Eyes: Arcus senilis, injected sclerae, PERRLA, EOMI Nose: Nares, septum and mucosa are normal, no drainage or sinus tenderness Neck: No adenopathy, supple, symmetrical, trachea midline, thyroid not enlarged, no tenderness Resp: Clear  to auscultation bilaterally Cardio: Regular rate and rhythm, S1, S2 normal, no murmur, click, rub or gallop Breasts: Left breast has been reconstructed and is firm, left breast has numerous well-healed surgical scars, right breast is status post reduction and has numerous well-healed surgical scars, both breast have architectural changes, left breast has radiation changes noted, both breasts have a glandular texture, no lymphadenopathy, no right breast nipple inversion, bilateral axillary fullness  GI: Soft, distended, non-tender, hypoactive bowel sounds, no organomegaly Extremities: Extremities normal, atraumatic, no cyanosis, left upper extremity lymphedema Lymph nodes: Cervical, supraclavicular, and axillary nodes normal Neurologic: Grossly normal    LAB RESULTS: Lab Results  Component Value Date   WBC 5.5 03/30/2013   NEUTROABS 3.5 03/30/2013   HGB 12.9 03/30/2013   HCT 37.4 03/30/2013   MCV 83.9 03/30/2013   PLT 206 03/30/2013      Chemistry      Component Value Date/Time   NA 141 03/30/2013 1020   NA 144 03/11/2013 0822   K 3.9 03/30/2013 1020   K 4.8 03/11/2013 0822   CL 105 03/30/2013 1020   CL 109 03/11/2013 0822   CO2 25 03/30/2013 1020   CO2 26 03/11/2013 0822   BUN 10.3 03/30/2013 1020   BUN 13 03/11/2013 0822   CREATININE 0.7 03/30/2013 1020   CREATININE 0.8 03/11/2013 0822      Component Value Date/Time   CALCIUM 10.0 03/30/2013 1020   CALCIUM 10.0 03/11/2013 0822   ALKPHOS 95 03/30/2013 1020   ALKPHOS 79 12/15/2012 1025   AST 16 03/30/2013 1020   AST 39* 12/15/2012 1025   ALT 16 03/30/2013 1020   ALT 45* 12/15/2012 1025   BILITOT 0.51 03/30/2013 1020   BILITOT 0.4 12/15/2012 1025  Lab Results  Component Value Date   LABCA2 25 09/06/2012    Urinalysis    Component Value Date/Time   COLORURINE YELLOW 12/15/2012 0955   APPEARANCEUR CLEAR 12/15/2012 0955   LABSPEC 1.009 12/15/2012 0955   LABSPEC 1.005 05/09/2009 0900   PHURINE 5.5 12/15/2012 0955   GLUCOSEU 500* 12/15/2012 0955    HGBUR NEGATIVE 12/15/2012 0955   HGBUR small 08/20/2010 1051   BILIRUBINUR neg 01/21/2013 1513   BILIRUBINUR NEGATIVE 12/15/2012 0955   KETONESUR NEGATIVE 12/15/2012 0955   PROTEINUR NEGATIVE 12/15/2012 0955   UROBILINOGEN 0.2 01/21/2013 1513   UROBILINOGEN 0.2 12/15/2012 0955   NITRITE Neg 01/21/2013 1513   NITRITE NEGATIVE 12/15/2012 0955   LEUKOCYTESUR Trace 01/21/2013 1513    STUDIES: 1.  The patient's last unilateral right digital screening mammogram on 08/13/2012 showed scattered fibroglandular densities.  No suspicious masses, architectural distortion, or calcifications are present.  No mammographic evidence of malignancy.  2.  The patient's last bone density scan on 08/13/2012 showed a T score of -2.0 (osteopenia).   ASSESSMENT: 67 y.o. [place], [place] woman: 1.  Status post left breast needle core biopsy of the upper outer quadrant on 04/04/2009 which showed invasive mammary carcinoma that appeared to be a low to intermediate grade invasive ductal carcinoma, estrogen receptor positive 99%, progesterone receptor positive 99%, Ki-67 30%, HER-2/neu by stitch no amplification with a ratio of 0.93.  2.  Status post bilateral breast MRI on 04/11/2009 which showed minimal background nodular parenchymal enhancement in both breasts.  Multiple rounded and confluent enhancing masses centered in the lateral aspect of the left breast.  This involved all four quadrants of the breast and measured 9.5 x 5.5 x 5.3 cm in maximum dimensions.  A post biopsy marker artifact was demonstrated in the lateral aspect of the area of abnormal enhancement, in the largest confluent portion.  No additional masses or areas of abnormal enhancement suspicious for malignancy were seen in either breast.  No abnormal appearing lymph nodes were demonstrated  (Clinical stage IIB, T3 N0).  3.  The patient had a 2-D echocardiogram on 04/19/2009 which showed an estimated ejection fraction in the range of 65% to 70%.  4.  The  patient's had neoadjuvant chemotherapy with FEC (5-FU/Epirubicin/Cytoxan) from 04/26/2009 through 06/07/2009 x 4 cycles with Neulasta support.  This was followed by neoadjuvant chemotherapy with Taxotere from 06/21/2009 through 08/09/2009 x 4 cycles with Neulasta support.  5.  Status post left breast simple mastectomy with left sentinel axillary node biopsy on 09/04/2009 for a stage IIB, ypT2 ypN1a ypMX, 5 cm invasive ductal carcinoma grade 2, estrogen receptor 99% positive, progesterone receptor 99% positive, Ki-67 30%, HER-2/neu by CISH no amplification with a ratio of 0.93, with 2/3 metastatic left axillary lymph nodes.  6.  Status post left axillary regional lymph node resection on 11/08/2009 in which 0/13 lymph nodes were negative for carcinoma.  7.  The patient underwent radiation therapy from 12/17/2009 until 01/25/2010.  Radiation course complicated by skin toxicity and moist desquamation by surgical scar.  8.  The patient started adjuvant antiestrogen therapy with Arimidex in 02/2010.  9.  Status post left breast reconstruction with a latissimus flap [facility] by Dr. Jonna Munro in 08/2010.  The patient underwent several subsequent revisions and ultimately a tissue flap transfer from her abdomen because of poor wound healing that required 2 wound vacs.  She eventually had the expander removed and an implant placed in 02/2011.  10.  Osteopenia  11.  Vaginal dryness  12.  Left upper extremity lymphedema   PLAN: The patient will continue antiestrogen therapy with Anastrozole (Arimidex) 1 mg by mouth daily.  An electronic prescription for this medication was sent to the patient's pharmacy #90 with 5 refills.  Dr. Donnie [name] had previously prescribed Alendronate for Chelsey [name] related to her osteopenia, but she did not take this medication due to fear of exacerbating her already chronic arthralgias and myalgias.  She is agreed to start taking Alendronate  (Fosamax) as directed.  The patient is also taking calcium 600 mg per day and vitamin D3 1000 IUs per day.  Chelsey [name] was encouraged to establish an exercise regimen.  Various over-the-counter remedies were discussed with Chelsey [name] regarding vaginal dryness.  She is currently using Replens with minimal success.  We will schedule the patient to visit the [facility] rehabilitation center for chronic left upper extremity lymphedema.  She briefly met with Dorothyann Gibbs, Rehabilitation Physical Therapist today during her office visit.  We encouraged Chelsey [name] to schedule her physician visits several months apart so she is continuously being monitored for a recurrence of breast cancer.  We will assist her with rescheduling Dr. Rosezena Sensor office visit for either November or December 2014.  She states she is due to see her plastic surgeon Dr. Jonna Munro soon.  Chelsey [name] also states that Dr. Onalee Hua will be scheduling her for breast MRIs every 3 years.  She will receive a breast MRI this year.  Chelsey [name] was provided a prescription today for prosthetic silicone implants and post-mastectomy bras at her request.  Chelsey [name] and I discussed her recent laboratory results and she was provided a copy of her laboratories.    We plan to see Chelsey [name] again in one year at which time we will check a CBC, CMP, LDH, and vitamin D level.  We will schedule her annual unilateral right screening mammogram in 08/2013 for her.    All questions were answered.  The patient was encouraged to contact us in the interim with any problems, questions or concerns.   Larina Bras, NP-C 04/06/2013, 1:35 PM  ADDENDUM: This 67 year old [place] woman underwent left breast biopsy June of 2010 for a clinical T3 NX invasive ductal carcinoma, low-grade, estrogen and progesterone receptor both strongly positive, HER-2 not amplified, with an MIB-1 of 30%. She was treated neoadjuvantly with fluorouracil, epirubicin and cyclophosphamide x4 followed by docetaxel  x4, all chemotherapy completed in October of 2010. On November 2010 she underwent left mastectomy with sentinel lymph node sampling for a residual ypT2 ypN1, stage IIB invasive ductal carcinoma, grade 2. HER-2 was repeated and was again negative.  She underwent full axillary lymph node dissection January of 2011 with 0 of 13 additional lymph nodes removed involved by tumor (overall she had 2 of 16 lymph nodes positive). She completed adjuvant radiation treatments April of 2011 the start of antiestrogen therapy was delayed because of complications related to her reconstruction. She did start anastrozole April of 2011.  Today we discussed her diagnosis, prior treatment, and upcoming treatment plan. I offered to switch her to tamoxifen since she is having problems with vaginal dryness and some aches and pains that may be related to the aromatase inhibitor she is taking. However she is "uncomfortable" with anastrozole, is concerned regarding the possibility of clots on tamoxifen, and therefore the plan will be to continue anastrozole on to she completes 5 years. That will be in August of 2016.  She will see  Dr. Luisa [name] in about 6 months. She will see me again in one year. She knows to call for any problems that may develop before that visit.  I personally saw this patient and performed a substantive portion of this encounter with the listed APP documented above.   Lowella Dell, MD

## 2013-04-06 NOTE — Patient Instructions (Addendum)
Please contact us at [phone] if you have any questions or concerns.  Please continue to do well and enjoy life!!!  Get plenty of rest, drink plenty of water, exercise daily (walking), eat a balanced diet.  Take Caltrate daily and Vitamin D3 1000 IUs daily.   Coconut oil for vaginal dryness  [email or web address]   Results for orders placed in visit on 03/30/13 (from the past 336 hour(s))  CBC WITH DIFFERENTIAL   Collection Time    03/30/13 10:20 AM      Result Value Range   WBC 5.5  3.9 - 10.3 10e3/uL   NEUT# 3.5  1.5 - 6.5 10e3/uL   HGB 12.9  11.6 - 15.9 g/dL   HCT 16.1  09.6 - 04.5 %   Platelets 206  145 - 400 10e3/uL   MCV 83.9  79.5 - 101.0 fL   MCH 28.9  25.1 - 34.0 pg   MCHC 34.5  31.5 - 36.0 g/dL   RBC 4.09  8.11 - 9.14 10e6/uL   RDW 15.3 (*) 11.2 - 14.5 %   lymph# 1.6  0.9 - 3.3 10e3/uL   MONO# 0.3  0.1 - 0.9 10e3/uL   Eosinophils Absolute 0.1  0.0 - 0.5 10e3/uL   Basophils Absolute 0.0  0.0 - 0.1 10e3/uL   NEUT% 62.9  38.4 - 76.8 %   LYMPH% 28.8  14.0 - 49.7 %   MONO% 5.3  0.0 - 14.0 %   EOS% 2.2  0.0 - 7.0 %   BASO% 0.8  0.0 - 2.0 %  VITAMIN D 25 HYDROXY   Collection Time    03/30/13 10:20 AM      Result Value Range   Vit D, 25-Hydroxy 50  30 - 89 ng/mL  COMPREHENSIVE METABOLIC PANEL (CC13)   Collection Time    03/30/13 10:20 AM      Result Value Range   Sodium 141  136 - 145 mEq/L   Potassium 3.9  3.5 - 5.1 mEq/L   Chloride 105  98 - 107 mEq/L   CO2 25  22 - 29 mEq/L   Glucose 180 (*) 70 - 99 mg/dl   BUN 78.2  7.0 - 95.6 mg/dL   Creatinine 0.7  0.6 - 1.1 mg/dL   Total Bilirubin 2.13  0.20 - 1.20 mg/dL   Alkaline Phosphatase 95  40 - 150 U/L   AST 16  5 - 34 U/L   ALT 16  0 - 55 U/L   Total Protein 7.1  6.4 - 8.3 g/dL   Albumin 3.8  3.5 - 5.0 g/dL   Calcium 08.6  8.4 - 57.8 mg/dL

## 2013-04-07 DIAGNOSIS — C50412 Malignant neoplasm of upper-outer quadrant of left female breast: Secondary | ICD-10-CM | POA: Insufficient documentation

## 2013-04-11 ENCOUNTER — Ambulatory Visit: Payer: Medicare Other | Attending: Family | Admitting: Physical Therapy

## 2013-04-11 DIAGNOSIS — Z9010 Acquired absence of unspecified breast and nipple: Secondary | ICD-10-CM | POA: Insufficient documentation

## 2013-04-11 DIAGNOSIS — I890 Lymphedema, not elsewhere classified: Secondary | ICD-10-CM | POA: Insufficient documentation

## 2013-04-11 DIAGNOSIS — IMO0001 Reserved for inherently not codable concepts without codable children: Secondary | ICD-10-CM | POA: Insufficient documentation

## 2013-04-11 DIAGNOSIS — Z96659 Presence of unspecified artificial knee joint: Secondary | ICD-10-CM | POA: Insufficient documentation

## 2013-04-11 DIAGNOSIS — C50919 Malignant neoplasm of unspecified site of unspecified female breast: Secondary | ICD-10-CM | POA: Diagnosis not present

## 2013-04-18 ENCOUNTER — Ambulatory Visit (INDEPENDENT_AMBULATORY_CARE_PROVIDER_SITE_OTHER): Payer: Medicare Other | Admitting: Surgery

## 2013-04-21 ENCOUNTER — Other Ambulatory Visit: Payer: Self-pay

## 2013-04-21 DIAGNOSIS — Z978 Presence of other specified devices: Secondary | ICD-10-CM | POA: Diagnosis not present

## 2013-05-18 ENCOUNTER — Telehealth: Payer: Self-pay | Admitting: *Deleted

## 2013-05-18 NOTE — Telephone Encounter (Signed)
On 05-18-13 fax medical records to wendy white at [facility], it was consult note, end of tx , follow up note.

## 2013-05-19 ENCOUNTER — Other Ambulatory Visit: Payer: Self-pay | Admitting: Internal Medicine

## 2013-05-20 ENCOUNTER — Other Ambulatory Visit: Payer: Self-pay | Admitting: Internal Medicine

## 2013-05-20 MED ORDER — ROSUVASTATIN CALCIUM 10 MG PO TABS: ORAL_TABLET | ORAL | Status: DC

## 2013-05-30 ENCOUNTER — Other Ambulatory Visit: Payer: Self-pay | Admitting: Internal Medicine

## 2013-05-31 DIAGNOSIS — Z96659 Presence of unspecified artificial knee joint: Secondary | ICD-10-CM | POA: Diagnosis not present

## 2013-05-31 NOTE — Telephone Encounter (Signed)
Last seen and filled on 03/21/13 #40 with 0 additional refills Has a future appt for 06/23/13

## 2013-05-31 NOTE — Telephone Encounter (Signed)
Ok to refill x 1  

## 2013-06-01 ENCOUNTER — Other Ambulatory Visit: Payer: Self-pay | Admitting: Internal Medicine

## 2013-06-01 MED ORDER — ALPRAZOLAM 0.25 MG PO TABS: ORAL_TABLET | ORAL | Status: DC

## 2013-06-16 ENCOUNTER — Other Ambulatory Visit (INDEPENDENT_AMBULATORY_CARE_PROVIDER_SITE_OTHER): Payer: Medicare Other

## 2013-06-16 DIAGNOSIS — E119 Type 2 diabetes mellitus without complications: Secondary | ICD-10-CM | POA: Diagnosis not present

## 2013-06-20 ENCOUNTER — Other Ambulatory Visit: Payer: Self-pay | Admitting: Internal Medicine

## 2013-06-21 ENCOUNTER — Other Ambulatory Visit: Payer: Medicare Other

## 2013-06-21 NOTE — Telephone Encounter (Signed)
Last filled on 06/01/13 #40 with 0 additional refills Has a follow up on 06/23/13 Last seen on 03/21/13 Please advise Thanks!

## 2013-06-22 NOTE — Telephone Encounter (Signed)
Will talk to her at her OV tomorrow about this ?

## 2013-06-23 ENCOUNTER — Other Ambulatory Visit: Payer: Self-pay | Admitting: Internal Medicine

## 2013-06-23 ENCOUNTER — Encounter: Payer: Self-pay | Admitting: Internal Medicine

## 2013-06-23 ENCOUNTER — Ambulatory Visit (INDEPENDENT_AMBULATORY_CARE_PROVIDER_SITE_OTHER): Payer: Medicare Other | Admitting: Internal Medicine

## 2013-06-23 ENCOUNTER — Telehealth: Payer: Self-pay | Admitting: *Deleted

## 2013-06-23 VITALS — BP 100/46 | HR 65 | Temp 98.1°F | Wt 196.0 lb

## 2013-06-23 DIAGNOSIS — E119 Type 2 diabetes mellitus without complications: Secondary | ICD-10-CM

## 2013-06-23 DIAGNOSIS — E785 Hyperlipidemia, unspecified: Secondary | ICD-10-CM

## 2013-06-23 DIAGNOSIS — M179 Osteoarthritis of knee, unspecified: Secondary | ICD-10-CM

## 2013-06-23 DIAGNOSIS — G4733 Obstructive sleep apnea (adult) (pediatric): Secondary | ICD-10-CM | POA: Diagnosis not present

## 2013-06-23 DIAGNOSIS — IMO0002 Reserved for concepts with insufficient information to code with codable children: Secondary | ICD-10-CM

## 2013-06-23 DIAGNOSIS — G479 Sleep disorder, unspecified: Secondary | ICD-10-CM

## 2013-06-23 DIAGNOSIS — Z23 Encounter for immunization: Secondary | ICD-10-CM

## 2013-06-23 DIAGNOSIS — M1710 Unilateral primary osteoarthritis, unspecified knee: Secondary | ICD-10-CM

## 2013-06-23 DIAGNOSIS — C50412 Malignant neoplasm of upper-outer quadrant of left female breast: Secondary | ICD-10-CM

## 2013-06-23 DIAGNOSIS — C50419 Malignant neoplasm of upper-outer quadrant of unspecified female breast: Secondary | ICD-10-CM

## 2013-06-23 MED ORDER — ALPRAZOLAM 0.25 MG PO TABS: ORAL_TABLET | ORAL | Status: DC

## 2013-06-23 NOTE — Progress Notes (Signed)
Chief Complaint  Patient presents with  . Follow-up    multiple issues     HPI: Patient comes in today for follow up of  multiple medical problems.    To  Get righ tkr in December  Left knee is better .    voltaren pills . Every 2-3 days  For not walking.     Having issues  Exercising   Doing pool swimming. Still having aching legs better with both aren't so doesn't think it's the Crestor.  In regard to blood sugars hasn't been eating as well lately after when on vacation but still trying to do better. No numbness in her feet has leftover tingling in hands from chemotherapy. No unusual infections.  Breast cancer  Transitioning to  [facility].    For   Oncology .  After her primary left.  May be overdue for colonoscopy hasn't gotten acquired fourth toe with slight get it done before her knee surgery also. Her last colonoscopy she stopped breathing and soon after that was diagnosed with a sleep apnea syndrome.  Taking Xanax at night to help her sleep been using it fairly regularly recently a half to one doesn't make her foggy the next day. Is willing to consider other meds but not sleeping pills  Has never had a flu vaccine but will take 1 today.  ROS: See pertinent positives and negatives per HPI. No current chest pain shortness of breath to some swelling in her right foot under therapy for lymphedema of her left arm. No GI bleeding noted no syncope dizziness.  Past Medical History  Diagnosis Date  . Diabetes mellitus   . Allergy   . Arthritis     osteoarthritis  . Hypertension   . Hyperlipidemia   . GERD (gastroesophageal reflux disease)   . Pneumonia 07/28/2011  . Sleep apnea     setting of 10  . Cancer     breast  . Colitis   . Sinusitis   . Ulcer     Oral ulcers durin chemotherapy in 2010  . Carpal tunnel syndrome of right wrist   . Squamous cell carcinoma     left leg    Family History  Problem Relation Age of Onset  . Alzheimer's disease Mother   . Sleep apnea  Mother   . Diabetes Mother   . Hypertension Mother   . Hyperlipidemia Mother   . Breast cancer Mother 27    triple negative  . Anxiety disorder Daughter   . Hypertension Son     pulmonary  . Congenital heart disease Son 0    TGA  died age 96     History   Social History  . Marital Status: Married    Spouse Name: N/A    Number of Children: N/A  . Years of Education: N/A   Occupational History  . realtor    Social History Main Topics  . Smoking status: Never Smoker   . Smokeless tobacco: Never Used  . Alcohol Use: 3.6 oz/week    6 Glasses of wine per week  . Drug Use: No  . Sexual Activity: Yes    Birth Control/ Protection: Post-menopausal, Surgical   Other Topics Concern  . 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    Outpatient Encounter Prescriptions as of 06/23/2013  Medication Sig Dispense Refill  . alendronate (FOSAMAX) 35 MG tablet Take 35 mg by mouth every 7 (seven) days. Take with a full glass of water on an empty stomach.      Marland Kitchen anastrozole (ARIMIDEX) 1 MG tablet Take 1 tablet (1 mg total) by mouth daily.  90 tablet  5  . b complex vitamins capsule Take 1 capsule by mouth daily.      . Calcium Carbonate-Vitamin D (CALTRATE 600+D PO) Take 1 tablet by mouth daily.      . Cholecalciferol (VITAMIN D-3) 1000 UNITS CAPS Take 1 capsule by mouth daily.      . cycloSPORINE (RESTASIS) 0.05 % ophthalmic emulsion Place 1 drop into both eyes daily.      . diclofenac (VOLTAREN) 75 MG EC tablet Take 75 mg by mouth as needed.       Marland Kitchen losartan (COZAAR) 50 MG tablet Take 50 mg by mouth at bedtime.      . metFORMIN (GLUMETZA) 500 MG (MOD) 24 hr tablet Take 1,500 mg by mouth daily. 2 tablets with breakfast (1000 mg)  and 1 tablet with dinner (500 mg) = 1500 per day      . omeprazole (PRILOSEC) 20 MG capsule Take 20 mg by mouth daily.      Marland Kitchen oxyCODONE  (OXY IR/ROXICODONE) 5 MG immediate release tablet Take 5 mg by mouth every 8 (eight) hours as needed.       . rosuvastatin (CRESTOR) 10 MG tablet take 1 tablet by mouth once daily  30 tablet  5  . sertraline (ZOLOFT) 50 MG tablet Take 1 tablet (50 mg total) by mouth daily.  90 tablet  1  . [DISCONTINUED] ALPRAZolam (XANAX) 0.25 MG tablet take 1 tablet by mouth at bedtime if needed for sleep or anxiety  40 tablet  0   No facility-administered encounter medications on file as of 06/23/2013.    EXAM:  BP 100/46  Pulse 65  Temp(Src) 98.1 F (36.7 C) (Oral)  Wt 196 lb (88.905 kg)  BMI 33.63 kg/m2  SpO2 98%  Body mass index is 33.63 kg/(m^2).  GENERAL: vitals reviewed and listed above, alert, oriented, appears well hydrated and in no acute distress  HEENT: atraumatic, conjunctiva  clear, no obvious abnormalities on inspection of external nose and ears OP : no lesion edema or exudate  NECK: no obvious masses on inspection palpation  LUNGS: clear to auscultation bilaterally, no wheezes, rales or rhonchi, good air movement CV: HRRR, no clubbing cyanosis or  peripheral edema nl cap refill  MS: moves all extremities without noticeable focal  abnormality antalgic gait slight swelling of feet pulses are present diabetic foot exam PSYCH: pleasant and cooperative, no obvious depression or anxiety Lab Results  Component Value Date   WBC 5.5 03/30/2013   HGB 12.9 03/30/2013   HCT 37.4 03/30/2013   PLT 206 03/30/2013   GLUCOSE 180* 03/30/2013   CHOL 168 11/05/2012   TRIG 132.0 11/05/2012   HDL 60.30 11/05/2012   LDLDIRECT 101.5 07/24/2011   LDLCALC 81 11/05/2012   ALT 16 03/30/2013   AST 16 03/30/2013   NA 141 03/30/2013   K 3.9 03/30/2013   CL 105 03/30/2013   CREATININE 0.7 03/30/2013   BUN 10.3 03/30/2013   CO2 25 03/30/2013   TSH 1.58 11/05/2012   INR 0.93 12/15/2012   HGBA1C 7.0* 06/16/2013   MICROALBUR 0.6 02/14/2009    ASSESSMENT AND PLAN:  Discussed the following assessment and plan:  Diabetes  mellitus, type  2 - Slight deterioration patient wants to alter lifestyle before increasing her metformin to 2 g  Need for prophylactic vaccination and inoculation against influenza - Plan: Flu Vaccine QUAD 36+ mos PF IM (Fluarix)  OSA on CPAP  Degenerative joint disease of knee - Problematic for her activity level of today she is an acceptable candidate for surgery  Sleep disturbance, unspecified - Refill Xanax for now would not plan this to be a long-term solution.  Breast cancer of upper-outer quadrant of left female breast  HYPERLIPIDEMIA  -Patient advised to return or notify health care team  if symptoms worsen or persist or new concerns arise.  Patient Instructions  Will flag  Dr Delia Chimes office about colonoscopy .  Arrangement but you should contact them too.,  Intensify lifestyle interventions.  for sugar control  If not getting better we would increase the  metformin to 2000 mg per day.  Caution with the xanax .  Sometimes.  Can try trazodone.   That causes sedation.      Neta Mends. Letrell Attwood M.D.

## 2013-06-23 NOTE — Patient Instructions (Signed)
Will flag  Dr Delia Chimes office about colonoscopy .  Arrangement but you should contact them too.,  Intensify lifestyle interventions.  for sugar control  If not getting better we would increase the  metformin to 2000 mg per day.  Caution with the xanax .  Sometimes.  Can try trazodone.   That causes sedation.

## 2013-06-23 NOTE — Telephone Encounter (Signed)
Rene Kocher, could you, please put her paper chart on my desk and set up an appointment to discuss colonoscopy, within 4 weeks, she ought to have the colon before her knee surgery in december. Thanx. DB ----- Message ----- From: Madelin Headings, MD Sent: 06/23/2013 12:53 PM To: Hart Carwin, MD  Requested chart for Dr. Juanda Chance. Left a message for patient to call me.

## 2013-06-24 ENCOUNTER — Telehealth: Payer: Self-pay | Admitting: Family Medicine

## 2013-06-24 NOTE — Telephone Encounter (Signed)
Received fax from [facility].  This patient is having knee surgery on 09/12/13.  She needs surgical clearance.  Please make 30 minute appt.  Thanks! Please send this back with the date she is coming.

## 2013-06-24 NOTE — Telephone Encounter (Signed)
Left a message for patient to call me. 

## 2013-06-24 NOTE — Telephone Encounter (Signed)
lmom for pt to sch appt °

## 2013-06-27 NOTE — Telephone Encounter (Signed)
I can sign the paper as of appointment today however.  Needed an update between now and December if they need a 30 day reassessment.

## 2013-06-27 NOTE — Telephone Encounter (Signed)
S/w pt and she stated that when she was here last week, Dr. Fabian Sharp told her that she would not have to come back in. She also told her that she would just "sign off on the paperwork". Pt states that she will come back in if she has to, but would prefer not to. Please assist.

## 2013-06-27 NOTE — Telephone Encounter (Signed)
Is this correct?  If so, I have paperwork.

## 2013-06-27 NOTE — Telephone Encounter (Signed)
Scheduled patient on 07/08/13 at 3:00PM with Dr. Juanda Chance.

## 2013-06-27 NOTE — Telephone Encounter (Signed)
Left a message for patient to call me. 

## 2013-06-28 ENCOUNTER — Encounter: Payer: Self-pay | Admitting: *Deleted

## 2013-06-28 NOTE — Telephone Encounter (Signed)
Notifiy pt will fill out paperwork and make appt if needed.  Thanks!

## 2013-07-04 ENCOUNTER — Ambulatory Visit: Payer: Medicare Other | Attending: Internal Medicine | Admitting: Physical Therapy

## 2013-07-04 DIAGNOSIS — Z96659 Presence of unspecified artificial knee joint: Secondary | ICD-10-CM | POA: Diagnosis not present

## 2013-07-04 DIAGNOSIS — IMO0001 Reserved for inherently not codable concepts without codable children: Secondary | ICD-10-CM | POA: Insufficient documentation

## 2013-07-04 DIAGNOSIS — I890 Lymphedema, not elsewhere classified: Secondary | ICD-10-CM | POA: Insufficient documentation

## 2013-07-04 DIAGNOSIS — Z853 Personal history of malignant neoplasm of breast: Secondary | ICD-10-CM | POA: Insufficient documentation

## 2013-07-06 ENCOUNTER — Ambulatory Visit: Payer: Medicare Other

## 2013-07-08 ENCOUNTER — Ambulatory Visit: Payer: Medicare Other

## 2013-07-08 ENCOUNTER — Encounter: Payer: Self-pay | Admitting: Internal Medicine

## 2013-07-08 ENCOUNTER — Ambulatory Visit (INDEPENDENT_AMBULATORY_CARE_PROVIDER_SITE_OTHER): Payer: Medicare Other | Admitting: Internal Medicine

## 2013-07-08 VITALS — BP 118/60 | HR 72 | Ht 63.25 in | Wt 199.0 lb

## 2013-07-08 DIAGNOSIS — Z86010 Personal history of colonic polyps: Secondary | ICD-10-CM | POA: Diagnosis not present

## 2013-07-08 DIAGNOSIS — G4733 Obstructive sleep apnea (adult) (pediatric): Secondary | ICD-10-CM | POA: Diagnosis not present

## 2013-07-08 DIAGNOSIS — Z1211 Encounter for screening for malignant neoplasm of colon: Secondary | ICD-10-CM | POA: Diagnosis not present

## 2013-07-08 DIAGNOSIS — K5289 Other specified noninfective gastroenteritis and colitis: Secondary | ICD-10-CM

## 2013-07-08 NOTE — Patient Instructions (Addendum)
You have been scheduled for a colonoscopy with propofol. Please follow written instructions given to you at your visit today.  Please pick up your prep kit at the pharmacy within the next 1-3 days. If you use inhalers (even only as needed), please bring them with you on the day of your procedure. Your physician has requested that you go to www.startemmi.com and enter the access code given to you at your visit today. This web site gives a general overview about your procedure. However, you should still follow specific instructions given to you by our office regarding your preparation for the procedure.  * Please follow diabetic instructions on your prep instructions    CC: Dr. Fabian Sharp

## 2013-07-08 NOTE — Progress Notes (Signed)
DEVINA BEZOLD 1945/10/14 MRN [id number]        History of Present Illness:  This is a 67 year old white female  here to discuss screening colonoscopy. She had a colonoscopy in August 2004 which showed segmental colitis and hyperplastic polyps. Biopsies from the sigmoid colon showed focal active colitis this neutrophilic cryptitis. Her IBD markers were negative. She has been asymptomatic. The inflammation was attributed to large doses of diclofenac. She was diagnosed with the left breast cancer T3 NX in 2010 and underwent the left mastectomy and radiation and chemotherapy. She was also diagnosed with obstructive sleep apnea shortly after her colonoscopy in 2004 when her oxygen saturations dropped due to large amount of sedation during the procedure, but she responded to stimulation. . She now wears PPB mask at night. She has not had any problems with sedation during her mastectomy and reconstruction as well as during the left knee replacement. She is scheduled to have a right knee replacement 09/12/2013.by Dr Despina Hick.and would like to have her colonoscopy done before that.   Past Medical History  Diagnosis Date  . Diabetes mellitus   . Allergy   . Osteoarthritis   . Hypertension   . Hyperlipidemia   . GERD (gastroesophageal reflux disease)   . Pneumonia 07/28/2011  . Sleep apnea     setting of 10  . Breast cancer   . Colitis   . Sinusitis   . Aphthous ulcer of mouth 2010    related to chemotherapy   . Carpal tunnel syndrome of right wrist   . Squamous cell carcinoma     left leg  . Colon polyp   . Diverticulosis   . Lymphedema    Past Surgical History  Procedure Laterality Date  . Cesarean section      x 3  . Mastectomy Left 11/23  . Reconstruction breast immediate / delayed w/ tissue expander Left   . Skin graft    . Tonsillectomy      as child  . Total knee arthroplasty Left 12/20/2012    Procedure: TOTAL KNEE ARTHROPLASTY;  Surgeon: Loanne Drilling, MD;  Location: [facility];   Service: Orthopedics;  Laterality: Left;  . Tubal ligation    . Squamous cell carcinoma excision Left     Left lower extremity    reports that she has never smoked. She has never used smokeless tobacco. She reports that she drinks about 3.6 ounces of alcohol per week. She reports that she does not use illicit drugs. family history includes Alzheimer's disease in her mother; Anxiety disorder in her daughter; Breast cancer (age of onset: 31) in her mother; Congenital heart disease (age of onset: 0) in her son; Diabetes in her mother; Hyperlipidemia in her mother; Hypertension in her mother and son; Sleep apnea in her mother. Allergies  Allergen Reactions  . Amoxicillin     REACTION: unspecified  . Lipitor [Atorvastatin Calcium]     Leg cramps  . Penicillins     As child  . Prednisone Itching  . Latex Rash        Review of Systems: Denies dysphagia odynophagia abdominal pain or change in bowel habits  The remainder of the 10 point ROS is negative except as outlined in H&P   Physical Exam: General appearance  Well developed, in no distress. Eyes- non icteric. HEENT nontraumatic, normocephalic. Mouth no lesions, tongue papillated, no cheilosis. Neck supple without adenopathy, thyroid not enlarged, no carotid bruits, no JVD. Lungs Clear to auscultation bilaterally. Post left  mastectomy with reconstruction Cor normal S1, normal S2, regular rhythm, no murmur,  quiet precordium. Abdomen: Protuberant and large abdomen with normal active bowel sounds. Relax. No tenderness or mass Rectal: Not done Extremities no pedal edema. Post total knee replacement on the left , lymphedema of the left arm  Skin no lesions. Neurological alert and oriented x 3. Psychological normal mood and affect.  Assessment and Plan:  67 year old white female who is due for screening colonoscopy. She has obstructive sleep apnea but is a suitable candidate for propofol sedation. We have discussed risk of sedation  and careful monitoring of her vital signs during the procedure. We have also discussed the prep and the procedure itself. She is interested in going ahead to schedule before her knee replacement in December 2014 Left mastectomy for breast cancer. Followed by Dr.Magrinat.  Colitis on colonoscopy  2004- will plan to obtain random biopsies.She is still on Diclofenac but lower doses.   07/08/2013 Lina Sar

## 2013-07-09 ENCOUNTER — Encounter: Payer: Self-pay | Admitting: Internal Medicine

## 2013-07-11 ENCOUNTER — Ambulatory Visit: Payer: Medicare Other

## 2013-07-11 MED ORDER — MOVIPREP 100 G PO SOLR: 1.0000 | Freq: Once | ORAL | Status: DC

## 2013-07-11 NOTE — Addendum Note (Signed)
Addended by: Richardson Chiquito on: 07/11/2013 09:34 AM   Modules accepted: Orders

## 2013-07-21 ENCOUNTER — Telehealth: Payer: Self-pay | Admitting: *Deleted

## 2013-07-21 NOTE — Telephone Encounter (Signed)
sw pt informed her that [facility] will be on pal 04/06/14. gv appt for 03/23/14 to hve labs@ 11am and ov on 03/30/14 @ 10am. Pt is aware that i will mail a letter/avs ....td

## 2013-07-22 ENCOUNTER — Other Ambulatory Visit: Payer: Self-pay | Admitting: Internal Medicine

## 2013-07-22 ENCOUNTER — Encounter: Payer: Medicare Other | Admitting: Physical Therapy

## 2013-07-22 NOTE — Telephone Encounter (Signed)
Ok to refill the xanax x 1  Metformin x 6 months

## 2013-07-22 NOTE — Telephone Encounter (Signed)
Alprazolam last filled on 06/23/13 #40 with 0 additional refills Patient last seen in the office on the same day Has a follow up scheduled on 10/18/13 Please advise.  Thanks!

## 2013-07-25 ENCOUNTER — Other Ambulatory Visit: Payer: Self-pay | Admitting: Internal Medicine

## 2013-07-25 MED ORDER — ALPRAZOLAM 0.25 MG PO TABS: ORAL_TABLET | ORAL | Status: DC

## 2013-07-25 MED ORDER — METFORMIN HCL ER (MOD) 500 MG PO TB24: 1500.0000 mg | ORAL_TABLET | Freq: Every day | ORAL | Status: DC

## 2013-08-15 ENCOUNTER — Ambulatory Visit
Admission: RE | Admit: 2013-08-15 | Discharge: 2013-08-15 | Disposition: A | Discharge status: Home / Self Care | Payer: Medicare Other | Source: Ambulatory Visit | Attending: Family | Admitting: Family

## 2013-08-15 ENCOUNTER — Encounter (INDEPENDENT_AMBULATORY_CARE_PROVIDER_SITE_OTHER): Payer: Self-pay | Admitting: Surgery

## 2013-08-15 ENCOUNTER — Ambulatory Visit (INDEPENDENT_AMBULATORY_CARE_PROVIDER_SITE_OTHER): Payer: Medicare Other | Admitting: Surgery

## 2013-08-15 VITALS — BP 130/76 | HR 72 | Temp 97.0°F | Resp 16 | Ht 63.5 in | Wt 199.0 lb

## 2013-08-15 DIAGNOSIS — Z853 Personal history of malignant neoplasm of breast: Secondary | ICD-10-CM

## 2013-08-15 DIAGNOSIS — Z1231 Encounter for screening mammogram for malignant neoplasm of breast: Secondary | ICD-10-CM | POA: Diagnosis not present

## 2013-08-15 NOTE — Progress Notes (Signed)
NAME: Chelsey [name]       DOB: 05/22/1946           DATE: 08/15/2013       MRN: [id number]   Chelsey [name] is a 67 y.o.Marland Kitchenfemale who presents for routine followup of her Left breast cancer diagnosed in 2010 and treated with  Left MRM . She has no problems or concerns on either side. Changing oncologist at this point to [facility] for follow up.  Needs another knee replacement. In good spirits.   PFSH: She has had no significant changes since the last visit here.  ROS: There have been no significant changes since the last visit here  EXAM:  VS: BP 130/76  Pulse 72  Temp(Src) 97 F (36.1 C) (Temporal)  Resp 16  Ht 5' 3.5" (1.613 m)  Wt 199 lb (90.266 kg)  BMI 34.69 kg/m2   General: The patient is alert, oriented, generally healthy appearing, NAD. Mood and affect are normal.  Breasts:  left breast reconstruction noted no masses  Right breast reduction noted no masses   Lymphatics: She has no axillary or supraclavicular adenopathy on either side.  Moderate  left arm lymphedema noted  Extremities: Full ROM of the surgical side with no lymphedema noted.  Data Reviewed:    -   MICROSCOPIC DESCRIPTION  4.  Specimen, including laterality: Left breast  Procedure: Mastectomy  Tumor size (gross measurement or glass slide measurement): 5 cm  Margins: Free of tumor  Invasive component, distance to closest margin: 3 cm from deep margin  In situ component, distance to closest margin: N/A  Lymph - vascular invasion: PRESENT  Histologic type, invasive component: Ductal  Grade, invasive component (nottingham combined histologic score): Ii  Tubule formation grade: 2  Nuclear pleomorphism grade: 2  Mitotic grade: 2  Ductal carcinoma in situ: No  Extensive intraductal component: No  Lobular neoplasia PRESENT: PRESENT, not identified  Treatment effect (if treated with neoadjuvant therapy): PRESENT  Multicentric (separate tumors in different quadrants): No  Multifocal (separate tumors in same  quadrant or biopsy): No  Macroscopic or microscopic extent of tumor: Confined to breast  Skin: Free of tumor  Nipple: Free of tumor  Skeletal muscle: N/A  Tnm code: Ypt 2, ypn1a, ypmx  Breast prognostic markers: Pm10-427  Estrogen receptor: 99%, positive  Progesterone receptor: 99%, positive  Ki 67 (mib-1): 30%  Her 2 neu by cish: No amplification, ratio 0.93  Non-Neoplastic breast: Fibrocystic changes  Comments: The area of the tumor shows dense fibrosis and patchy foci of residual  invasive ductal carcinoma. (Jdp:Mj 09/05/09)   CASE COMMENTS  Received fresh is tissue that contains a lymph node which is sampled for rapid intraoperative consult.  Left axillary sentinel lymph node by touch preparations-atypical cells PRESENT. (Bns)  Luisa Hart M.D., Jonny Ruiz, Pathologist, Electronic Signature  (Frozen section signed 11 24 2010)  Received fresh is tissue that contains a lymph node which is sampled for rapid intraoperative consult.  Left axillary sentinel lymph node by touch preparation-no tumor seen (bns)  Luisa Hart M.D., Jonny Ruiz, Pathologist, Electronic Signature  (Frozen section signed 11 24 2010)  Received fresh is tissue that contains a lymph node which is sampled for rapid intraoperative consult.  Left axillary sentinel lymph node by touch preparations-no tumor seen. (Bns)  Luisa Hart M.D., Jonny Ruiz, Pathologist, Electronic Signature  (Frozen section signed 11 24 2010)         Order Details View Encounter Lab and Collection Details Routing Result History      Specimen  Collected: 09/04/09 12:01 AM Last Resulted: 09/10/09 12:33 PM           Surgical pathology converted EChart      Status: Final result   MyChart: Not Released   Next appt with me: None      Result Narrative       [facility]  [address], [address]  [place], [place] [address]  Telephone [phone] or [phone] Fax [phone]   REPORT OF SURGICAL PATHOLOGY   Case #: [id number]  Patient  Name: Chelsey [name], Chelsey [name]  Office Chart Number: [id number]   MRN: [id number]  Pathologist: Luisa Hart M.D., Jonny Ruiz  DOB[birth year]Age [birth year] (Age: 59) Gender: F  Date Taken: 09/04/2009  Date Received: 09/04/2009   FINAL DIAGNOSIS  Microscopic Examination and Diagnosis   1. METASTATIC CARCINOMA IN ONE LYMPH NODE.  2. METASTATIC CARCINOMA IN ONE LYMPH NODE.  3. ONE BENIGN LYMPH NODE.  4. INVASIVE DUCTAL CARCINOMA.MARGINS NOT INVOLVED.   CLINICAL HISTORY   SPECIMEN(S) OBTAINED  1. Lymph node, sentinel, biopsy, Left Axilla  2. Lymph node, sentinel, biopsy, Left Axilla  3. Lymph node, sentinel, biopsy, Left Axilla  4. Breast, simple mastectomy, Left   SPECIMEN COMMENTS:  1. Left breast cancer; neoadjnvant chemo   Gross Description  1. Rapid intraoperative consult performed (yes or no): Yes  Specimen: Left axillary sentinel lymph node, received fresh for rapid  intraoperative consult  Number and size: One, 1 cm  Cut surface(s): Tan-Pink with fatty infiltration.  Block summary: Touch preparations are submitted for rapid intraoperative consult  on one slide and the lymph node is entirely submitted in one cassette.  2. Rapid intraoperative consult performed (yes or no): Yes  Specimen: Left axillary sentinel lymph node, received fresh for rapid  intraoperative consult.  Number and size: One, 1.4 cm  Cut surface(s): Tan-Pink with fatty infiltration  Block summary: Touch preparations are submitted for rapid intraoperative consult  on one slide and the lymph node is entirely submitted in one cassette.  3. Rapid intraoperative consult performed (yes or no): Yes  Specimen: Left axillary sentinel lymph node, received fresh for rapid  intraoperative consult.  Number and size: One, 0.6 cm  Cut surface(s): Tan-Pink with fatty infiltration  Block summary: Touch preparations are submitted for rapid intraoperative consult  on one slide and entirely submitted in one cassette. (Gp:Mj 09/04/09)  4. Specimen: Left  simple mastectomy, received fresh.The specimen is placed in  formalin at 10:30 a.m.On 09/04/09  Specimen integrity (intact/disrupted): Intact  Weight: 1,024 grams  Size: The breast tissue measures 29 x 21 x 6 cm  Skin: There is an attached 24 x 13 cm ellipse of skin skin with a central  unremarkable nipple. There is a suture attached along the superior edge  Tumor/Cavity : In the mid lateral breast there is an ill defined palpable  nodular area of dense white fibrous tissue which on sectioning is edematous.  This area measures 5 x 4 x 3 cm and there is a silver metallic biopsy clip  PRESENT in this area. This area is located 3 cm from the deep margin Uninvolved parenchyma: The remainder of the breast tissue consist of fat and  focally dense white fibrous tissue. In the lateral aspect of the breast there  is a 0.7 cm rubbery red nodule possibly representing an intraparenchymal lymph  node.  Prognostic indicators: Obtained from paraffin blocks if needed  Lymph nodes: A single potential intraparenchymal lymph node is identified  Block summary: 11 blocks  A- Nipple  B- Deep margin at mass  C-F- Sections of mass subsequentially labelled from lateral thru medial  G- Lower medial  H- Upper medial  I- Lower lateral  J- Upper lateral  K- Possible intraparenchymal lymph node (gp:mw 09/04/09)   MICROSCOPIC DESCRIPTION  4.  Specimen, including laterality: Left breast  Procedure: Mastectomy  Tumor size (gross measurement or glass slide measurement): 5 cm  Margins: Free of tumor  Invasive component, distance to closest margin: 3 cm from deep margin  In situ component, distance to closest margin: N/A  Lymph - vascular invasion: PRESENT  Histologic type, invasive component: Ductal  Grade, invasive component (nottingham combined histologic score): Ii  Tubule formation grade: 2  Nuclear pleomorphism grade: 2  Mitotic grade: 2  Ductal carcinoma in situ: No  Extensive intraductal component:  No  Lobular neoplasia PRESENT: PRESENT, not identified  Treatment effect (if treated with neoadjuvant therapy): PRESENT  Multicentric (separate tumors in different quadrants): No  Multifocal (separate tumors in same quadrant or biopsy): No  Macroscopic or microscopic extent of tumor: Confined to breast  Skin: Free of tumor  Nipple: Free of tumor  Skeletal muscle: N/A  Tnm code: Ypt 2, ypn1a, ypmx  Breast prognostic markers: Pm10-427  Estrogen receptor: 99%, positive  Progesterone receptor: 99%, positive  Ki 67 (mib-1): 30%  Her 2 neu by cish: No amplification, ratio 0.93  Non-Neoplastic breast: Fibrocystic changes  Comments: The area of the tumor shows dense fibrosis and patchy foci of residual  invasive ductal carcinoma. (Jdp:Mj 09/05/09)   CASE COMMENTS  Received fresh is tissue that contains a lymph node which is sampled for rapid intraoperative consult.  Left axillary sentinel lymph node by touch preparations-atypical cells PRESENT. (Bns)  Luisa Hart M.D., Jonny Ruiz, Pathologist, Electronic Signature  (Frozen section signed 11 24 2010)  Received fresh is tissue that contains a lymph node which is sampled for rapid intraoperative consult.  Left axillary sentinel lymph node by touch preparation-no tumor seen (bns)  Luisa Hart M.D., Jonny Ruiz, Pathologist, Electronic Signature  (Frozen section signed 11 24 2010)  Received fresh is tissue that contains a lymph node which is sampled for rapid intraoperative consult.  Left axillary sentinel lymph node by touch preparations-no tumor seen. (Bns)  Luisa Hart M.D., Jonny Ruiz, Pathologist, Electronic Signature  (Frozen section signed 11 24 2010)         Order Details View Encounter Lab and Collection Details Routing Result History      Specimen Collected: 09/04/09 12:01 AM Last Resulted: 09/10/09 12:33 PM          Authorizing Provider Information     Name: Provider Default, MD Fax:      Phone: [phone] Pager:                       Order  Information       Order Date/Time Release Date/Time Start Date/Time End Date/Time    09/04/2009 12:01 AM None 09/04/2009 12:01 AM None              Order Details       Frequency Duration Priority Order Class    None None Routine Normal                              Mammogram done today.  Results pending.   Impression: Doing well, with no evidence of recurrent cancer or new cancer On arimedex OA  Plan: Will continue to follow  up on an annual basis here.  Recommended lymphedema clinic and sleeve.  Pt getting sleeve.  Wants to hold on clinic for now.

## 2013-08-15 NOTE — Patient Instructions (Signed)
Return 1 year.  Good luck with surgeries.

## 2013-08-17 ENCOUNTER — Ambulatory Visit (AMBULATORY_SURGERY_CENTER): Payer: Medicare Other | Admitting: Internal Medicine

## 2013-08-17 ENCOUNTER — Encounter: Payer: Self-pay | Admitting: Internal Medicine

## 2013-08-17 VITALS — BP 102/50 | HR 53 | Temp 97.8°F | Resp 16 | Ht 63.25 in | Wt 199.0 lb

## 2013-08-17 DIAGNOSIS — Z860100 Personal history of colon polyps, unspecified: Secondary | ICD-10-CM

## 2013-08-17 DIAGNOSIS — K219 Gastro-esophageal reflux disease without esophagitis: Secondary | ICD-10-CM | POA: Diagnosis not present

## 2013-08-17 DIAGNOSIS — K5289 Other specified noninfective gastroenteritis and colitis: Secondary | ICD-10-CM

## 2013-08-17 DIAGNOSIS — E119 Type 2 diabetes mellitus without complications: Secondary | ICD-10-CM | POA: Diagnosis not present

## 2013-08-17 DIAGNOSIS — Z1211 Encounter for screening for malignant neoplasm of colon: Secondary | ICD-10-CM | POA: Diagnosis not present

## 2013-08-17 DIAGNOSIS — I10 Essential (primary) hypertension: Secondary | ICD-10-CM | POA: Diagnosis not present

## 2013-08-17 DIAGNOSIS — D126 Benign neoplasm of colon, unspecified: Secondary | ICD-10-CM

## 2013-08-17 DIAGNOSIS — G4733 Obstructive sleep apnea (adult) (pediatric): Secondary | ICD-10-CM | POA: Diagnosis not present

## 2013-08-17 LAB — GLUCOSE, CAPILLARY: Glucose-Capillary: 110 mg/dL — ABNORMAL HIGH (ref 70–99)

## 2013-08-17 MED ORDER — SODIUM CHLORIDE 0.9 % IV SOLN: 500.0000 mL | INTRAVENOUS | Status: DC

## 2013-08-17 NOTE — Op Note (Signed)
[facility] [address] [place], [address]   COLONOSCOPY PROCEDURE REPORT  PATIENT: Chelsey, [name]  MR#: [id number] BIRTHDATE: June 13, 1946 , 67  yrs. old GENDER: Female ENDOSCOPIST: Hart Carwin, MD REFERRED QI:ONGEX Lonie Peak, M.D. Dr Darnelle Catalan PROCEDURE DATE:  08/17/2013 PROCEDURE:   Colonoscopy, screening and Colonoscopy with biopsy First Screening Colonoscopy - Avg.  risk and is 50 yrs.  old or older - No.  Prior Negative Screening - Now for repeat screening. N/A  History of Adenoma - Now for follow-up colonoscopy & has been > or = to 3 yrs.  N/A  Polyps Removed Today? No.  Recommend repeat exam, <10 yrs? No. ASA CLASS:   Class II INDICATIONS:Average risk patient for colon cancer.  history of hyperplastic polyp in 2004, history of focal active colitis in sigmoid colon on colonoscopy in August 2004 MEDICATIONS: MAC sedation, administered by CRNA and propofol (Diprivan) 250mg  IV  DESCRIPTION OF PROCEDURE:   After the risks benefits and alternatives of the procedure were thoroughly explained, informed consent was obtained.  A digital rectal exam revealed no abnormalities of the rectum.   The LB [id number]-[id number]  endoscope was introduced through the anus and advanced to the cecum, which was identified by both the appendix and ileocecal valve. No adverse events experienced.   The quality of the prep was good, using MoviPrep  The instrument was then slowly withdrawn as the colon was fully examined.      COLON FINDINGS: There was moderate diverticulosis noted in the sigmoid colon with associated muscular hypertrophy and inflammatory changes. there was a small ulceration at the entrance into a.l. diverticulum at 15 cm. Mucosa was erythematous and there was a white exudate at the entrance into the diverticulum . Random biopsies of the sigmoid and descending colon were were taken to rule out focal active colitis Retroflexed views revealed no abnormalities. The  time to cecum=8 minutes 26 seconds.  Withdrawal time=7 minutes 30 seconds.  The scope was withdrawn and the procedure completed. COMPLICATIONS: There were no complications.  ENDOSCOPIC IMPRESSION: There was moderate diverticulosis noted in the sigmoid colon inflamed diverticulum at 15 cm. Clinically no evidence for diverticulitis History of focal active colitis in 2004. Random biopsy taken from the left colon today from normal-appearing mucosa of the left colon  RECOMMENDATIONS: 1.  Await biopsy results 2.  High fiber diet 3. recall colonoscopy 10 years  eSigned:  Hart Carwin, MD 08/17/2013 2:38 PM   cc:   [name], [name] MR#: [id number]

## 2013-08-17 NOTE — Progress Notes (Signed)
Lidocaine-40mg IV prior to Propofol InductionPropofol given over incremental dosages 

## 2013-08-17 NOTE — Patient Instructions (Signed)
YOU HAD AN ENDOSCOPIC PROCEDURE TODAY AT THE [facility]: Refer to the procedure report that was given to you for any specific questions about what was found during the examination.  If the procedure report does not answer your questions, please call your gastroenterologist to clarify.  If you requested that your care partner not be given the details of your procedure findings, then the procedure report has been included in a sealed envelope for you to review at your convenience later.  YOU SHOULD EXPECT: Some feelings of bloating in the abdomen. Passage of more gas than usual.  Walking can help get rid of the air that was put into your GI tract during the procedure and reduce the bloating. If you had a lower endoscopy (such as a colonoscopy or flexible sigmoidoscopy) you may notice spotting of blood in your stool or on the toilet paper. If you underwent a bowel prep for your procedure, then you may not have a normal bowel movement for a few days.  DIET: Your first meal following the procedure should be a light meal and then it is ok to progress to your normal diet.  A half-sandwich or bowl of soup is an example of a good first meal.  Heavy or fried foods are harder to digest and may make you feel nauseous or bloated.  Likewise meals heavy in dairy and vegetables can cause extra gas to form and this can also increase the bloating.  Drink plenty of fluids but you should avoid alcoholic beverages for 24 hours.  ACTIVITY: Your care partner should take you home directly after the procedure.  You should plan to take it easy, moving slowly for the rest of the day.  You can resume normal activity the day after the procedure however you should NOT DRIVE or use heavy machinery for 24 hours (because of the sedation medicines used during the test).    SYMPTOMS TO REPORT IMMEDIATELY: A gastroenterologist can be reached at any hour.  During normal business hours, 8:30 AM to 5:00 PM Monday through Friday,  call [phone].  After hours and on weekends, please call the GI answering service at [phone] who will take a message and have the physician on call contact you.   Following lower endoscopy (colonoscopy or flexible sigmoidoscopy):  Excessive amounts of blood in the stool  Significant tenderness or worsening of abdominal pains  Swelling of the abdomen that is new, acute  Fever of 100F or higher    FOLLOW UP: If any biopsies were taken you will be contacted by phone or by letter within the next 1-3 weeks.  Call your gastroenterologist if you have not heard about the biopsies in 3 weeks.  Our staff will call the home number listed on your records the next business day following your procedure to check on you and address any questions or concerns that you may have at that time regarding the information given to you following your procedure. This is a courtesy call and so if there is no answer at the home number and we have not heard from you through the emergency physician on call, we will assume that you have returned to your regular daily activities without incident.  SIGNATURES/CONFIDENTIALITY: You and/or your care partner have signed paperwork which will be entered into your electronic medical record.  These signatures attest to the fact that that the information above on your After Visit Summary has been reviewed and is understood.  Full responsibility of the confidentiality   of this discharge information lies with you and/or your care-partner.     

## 2013-08-17 NOTE — Progress Notes (Signed)
Called to room to assist during endoscopic procedure.  Patient ID and intended procedure confirmed with present staff. Received instructions for my participation in the procedure from the performing physician.  

## 2013-08-17 NOTE — Progress Notes (Signed)
Patient did not experience any of the following events: a burn prior to discharge; a fall within the facility; wrong site/side/patient/procedure/implant event; or a hospital transfer or hospital admission upon discharge from the facility. (G8907) Patient did not have preoperative order for IV antibiotic SSI prophylaxis. (G8918)  

## 2013-08-18 ENCOUNTER — Telehealth: Payer: Self-pay | Admitting: *Deleted

## 2013-08-18 NOTE — Telephone Encounter (Signed)
Left message on number given in admitting to return call if problems or questions. ewm 

## 2013-08-19 DIAGNOSIS — M899 Disorder of bone, unspecified: Secondary | ICD-10-CM | POA: Diagnosis not present

## 2013-08-19 DIAGNOSIS — Z124 Encounter for screening for malignant neoplasm of cervix: Secondary | ICD-10-CM | POA: Diagnosis not present

## 2013-08-19 DIAGNOSIS — G4700 Insomnia, unspecified: Secondary | ICD-10-CM | POA: Diagnosis not present

## 2013-08-23 ENCOUNTER — Encounter: Payer: Self-pay | Admitting: Internal Medicine

## 2013-08-30 ENCOUNTER — Telehealth: Payer: Self-pay | Admitting: Family Medicine

## 2013-08-30 NOTE — Telephone Encounter (Signed)
Received fax from [facility] on [address].  Pt is requesting refills Last filled on 07/25/13 #40 with 0 additional refills Has a future appointment on 10/18/13. Last seen on 06/23/13. Please advise. Thanks!

## 2013-08-30 NOTE — Telephone Encounter (Signed)
Ok to refill x 1  

## 2013-08-31 ENCOUNTER — Encounter (HOSPITAL_COMMUNITY): Payer: Self-pay | Admitting: Pharmacy Technician

## 2013-08-31 ENCOUNTER — Encounter (HOSPITAL_COMMUNITY): Payer: Self-pay

## 2013-08-31 ENCOUNTER — Other Ambulatory Visit: Payer: Self-pay | Admitting: Orthopedic Surgery

## 2013-08-31 ENCOUNTER — Encounter (HOSPITAL_COMMUNITY)
Admission: RE | Admit: 2013-08-31 | Discharge: 2013-08-31 | Disposition: A | Discharge status: Home / Self Care | Payer: Medicare Other | Source: Ambulatory Visit | Attending: Orthopedic Surgery | Admitting: Orthopedic Surgery

## 2013-08-31 DIAGNOSIS — Z01812 Encounter for preprocedural laboratory examination: Secondary | ICD-10-CM | POA: Insufficient documentation

## 2013-08-31 DIAGNOSIS — Z01818 Encounter for other preprocedural examination: Secondary | ICD-10-CM | POA: Insufficient documentation

## 2013-08-31 DIAGNOSIS — I890 Lymphedema, not elsewhere classified: Secondary | ICD-10-CM

## 2013-08-31 HISTORY — DX: Lymphedema, not elsewhere classified: I89.0

## 2013-08-31 LAB — CBC
HCT: 37.3 % (ref 36.0–46.0)
Platelets: 209 10*3/uL (ref 150–400)
RBC: 4.19 MIL/uL (ref 3.87–5.11)
RDW: 13.4 % (ref 11.5–15.5)
WBC: 5.1 10*3/uL (ref 4.0–10.5)

## 2013-08-31 LAB — COMPREHENSIVE METABOLIC PANEL
ALT: 30 U/L (ref 0–35)
AST: 31 U/L (ref 0–37)
Albumin: 4.1 g/dL (ref 3.5–5.2)
Alkaline Phosphatase: 91 U/L (ref 39–117)
CO2: 26 mEq/L (ref 19–32)
Chloride: 101 mEq/L (ref 96–112)
Creatinine, Ser: 0.66 mg/dL (ref 0.50–1.10)
GFR calc non Af Amer: >90 mL/min (ref >90)
Potassium: 3.9 mEq/L (ref 3.5–5.1)
Total Bilirubin: 0.6 mg/dL (ref 0.3–1.2)
Total Protein: 7.2 g/dL (ref 6.0–8.3)

## 2013-08-31 LAB — URINALYSIS, ROUTINE W REFLEX MICROSCOPIC
Glucose, UA: NEGATIVE mg/dL
Ketones, ur: NEGATIVE mg/dL
Leukocytes, UA: NEGATIVE
Protein, ur: NEGATIVE mg/dL
Specific Gravity, Urine: 1.034 — ABNORMAL HIGH (ref 1.005–1.030)
pH: 5.5 (ref 5.0–8.0)

## 2013-08-31 LAB — SURGICAL PCR SCREEN: Staphylococcus aureus: POSITIVE — AB

## 2013-08-31 LAB — PROTIME-INR: INR: 0.93 (ref 0.00–1.49)

## 2013-08-31 LAB — APTT: aPTT: 27 seconds (ref 24–37)

## 2013-08-31 MED ORDER — ALPRAZOLAM 0.25 MG PO TABS: ORAL_TABLET | ORAL | Status: DC

## 2013-08-31 NOTE — Telephone Encounter (Signed)
Called to the pharmacy and left on voicemail. 

## 2013-08-31 NOTE — Progress Notes (Signed)
08-31-13 1700 Pt. Screened Positive for Staph aureus PCR will need Mupirocin Ointment RX. Called to [facility] [phone].

## 2013-08-31 NOTE — Patient Instructions (Addendum)
20 Chelsey [name]  08/31/2013   Your procedure is scheduled on:   09-12-2013  Report to [facility] at     0615   AM.  Call this number if you have problems the morning of surgery: [phone]  Or Presurgical Testing [phone](Preslei Blakley)   Bring Cpap mask/ tubing.   Do not eat food:After Midnight.   Take these medicines the morning of surgery with A SIP OF WATER: Tylenol.  Do not take any diabetic meds  AM of. Bring /use Restasis as needed   Do not wear jewelry, make-up or nail polish.  Do not wear lotions, powders, or perfumes. You may wear deodorant.  Do not shave 12 hours prior to first CHG shower(legs and under arms).(face and neck okay.)  Do not bring valuables to the hospital.  Contacts, dentures or removable bridgework, body piercing, hair pins may not be worn into surgery.  Leave suitcase in the car. After surgery it may be brought to your room.  For patients admitted to the hospital, checkout time is 11:00 AM the day of discharge.   Patients discharged the day of surgery will not be allowed to drive home. Must have responsible person with you x 24 hours once discharged.  Name and phone number of your driver: [name]- spouse [phone] cell  Special Instructions: CHG(Chlorhedine 4%-"Hibiclens","Betasept","Aplicare") Shower Use Special Wash: see special instructions.(avoid face and genitals)   Please read over the following fact sheets that you were given: MRSA Information, Blood Transfusion fact sheet, Incentive Spirometry Instruction.  Remember : Type/Screen "Blue armbands" - may not be removed once applied(would result in being retested if removed).  Failure to follow these instructions may result in Cancellation of your surgery.   Patient signature_______________________________________________________

## 2013-08-31 NOTE — Progress Notes (Signed)
08-31-13 0930 Pt. Here for PAT appt. Need MD orders In Epic ASAP.

## 2013-08-31 NOTE — Pre-Procedure Instructions (Addendum)
08-31-13 EKG/ CXR 3'14  08-31-13 1700 Pt.  And Dr. Deri Fuelling  office notified Patient screened Positive for Staph aureus PCR, will need Mupirocin tx.

## 2013-09-01 ENCOUNTER — Other Ambulatory Visit: Payer: Self-pay | Admitting: Orthopedic Surgery

## 2013-09-11 ENCOUNTER — Other Ambulatory Visit: Payer: Self-pay | Admitting: Orthopedic Surgery

## 2013-09-11 NOTE — H&P (Signed)
Chelsey [name]  DOB: 04-25-1946 Married / Language: English / Race: White Female  Date of Admission:  09/12/2013  Chief Complaint:  Right Knee Pain  History of Present Illness The patient is a 66 year old female who comes in for a preoperative History and Physical. The patient is scheduled for a right total knee arthroplasty to be performed by Dr. Gus Rankin. Aluisio, MD at [facility] on 09/12/2013. The patient is a 67 year old female presenting for a post-operative visit. The patient comes in several months out from left total knee arthroplasty. The patient states that she is doing well at this time. The pain is under fair control at this time and describe their pain as mild to moderate. They are currently on Hydrocodone (on rare occasions) for their pain. The patient is currently doing home exercise program. Note for "Post TKA": She states that she still has quite a bit of burning pain and aching in both lower legs. The sometimes radiates to the left lateral hip, but for the most part is from the knee down. She states she has also had quite a bit of instability and pain in the right knee, and feels like she may be ready to schedule a total knee for that side. She is still taking the voltaren on occasion, and feels like that usually helps for a couple of days. She feels like the left knee is doing a lot better. The right knee is the main thing holding her back. If she is standing for a long time or walking for a long time she will get a burning feeling in both legs. She is concerned that the Crestor or the Arimidex might be contributing to some of this. She is ready to proceed with surgery on the right knee at this time. They have been treated conservatively in the past for the above stated problem and despite conservative measures, they continue to have progressive pain and severe functional limitations and dysfunction. They have failed non-operative management including home  exercise, medications, and injections. It is felt that they would benefit from undergoing total joint replacement. Risks and benefits of the procedure have been discussed with the patient and they elect to proceed with surgery. There are no active contraindications to surgery such as ongoing infection or rapidly progressive neurological disease.      Problem List Primary osteoarthritis of one knee S/P total knee replacement (V43.65). left    Allergies Latex. Rash. Penicillin G Sodium *PENICILLINS*. Childhood RXN    Family History Diabetes Mellitus. First Degree Relatives. mother Hypertension. mother    Social History Tobacco use. Never smoker. never smoker Exercise. Exercises never Drug/Alcohol Rehab (Previously). no Living situation. live with spouse Illicit drug use. no Drug/Alcohol Rehab (Currently). no Alcohol use. current drinker; drinks wine; 5-7 per week Current work status. working full time Children. 2 Number of flights of stairs before winded. 2-3 Marital status. married Tobacco / smoke exposure. no Pain Contract. no    Medication History Xanax (0.25MG  Tablet, Oral) Active. Arimidex (1MG  Tablet, Oral) Active. Restasis (0.05% Emulsion, Ophthalmic) Active. Diclofenac Sodium (75MG  Tablet DR, 1 (one) Tablet DR Oral two times daily, Taken starting 03/11/2013) Active. Losartan Potassium (50MG  Tablet, Oral) Active. MetFORMIN HCl (500MG  Tablet ER 24HR, Oral) Active. NexIUM (40MG  Capsule DR, Oral) Active. Crestor (10MG  Tablet, Oral) Active. Sertraline HCl (50MG  Tablet, Oral) Active.  Past Surgical History Mastectomy. left Cesarean Delivery. 3 or more times Breast Reconstruction. left Mammoplasty; Reduction. right Colon Polyp Removal - Colonoscopy  Total Knee Replacement - Left. Date: 12/2012.  Medical History Hypercholesterolemia High blood pressure Gastroesophageal Reflux Disease Anxiety Disorder Diabetes Mellitus, Type  II Breast Cancer. Left Sided Pneumonia Sleep Apnea. uses CPAP Diverticulosis    Review of Systems General:Not Present- Chills, Fever, Night Sweats, Fatigue, Weight Gain, Weight Loss and Memory Loss. Skin:Not Present- Hives, Itching, Rash, Eczema and Lesions. HEENT:Not Present- Tinnitus, Headache, Double Vision, Visual Loss, Hearing Loss and Dentures. Respiratory:Not Present- Shortness of breath with exertion, Shortness of breath at rest, Allergies, Coughing up blood and Chronic Cough. Cardiovascular:Not Present- Chest Pain, Racing/skipping heartbeats, Difficulty Breathing Lying Down, Murmur, Swelling and Palpitations. Gastrointestinal:Not Present- Bloody Stool, Heartburn, Abdominal Pain, Vomiting, Nausea, Constipation, Diarrhea, Difficulty Swallowing, Jaundice and Loss of appetitie. Female Genitourinary:Not Present- Blood in Urine, Urinary frequency, Weak urinary stream, Discharge, Flank Pain, Incontinence, Painful Urination, Urgency, Urinary Retention and Urinating at Night. Musculoskeletal:Present- Joint Pain. Not Present- Muscle Weakness, Muscle Pain, Joint Swelling, Back Pain, Morning Stiffness and Spasms. Neurological:Not Present- Tremor, Dizziness, Blackout spells, Paralysis, Difficulty with balance and Weakness. Psychiatric:Not Present- Insomnia.    Vitals Pulse: 68 (Regular) Resp.: 14 (Unlabored) BP: 118/62 (Sitting, Right Arm, Standard)     Physical Exam The physical exam findings are as follows:   General Mental Status - Alert, cooperative and good historian. General Appearance- pleasant. Not in acute distress. Orientation- Oriented X3. Build & Nutrition- Well nourished and Well developed.   Head and Neck Head- normocephalic, atraumatic . Neck Global Assessment- supple. no bruit auscultated on the right and no bruit auscultated on the left.   Eye Pupil- Bilateral- Regular and Round. Motion- Bilateral- EOMI.   Chest and  Lung Exam Auscultation: Breath sounds:- clear at anterior chest wall and - clear at posterior chest wall. Adventitious sounds:- No Adventitious sounds.   Cardiovascular Auscultation:Rhythm- Regular rate and rhythm. Heart Sounds- S1 WNL and S2 WNL. Murmurs & Other Heart Sounds:Auscultation of the heart reveals - No Murmurs.   Abdomen Inspection:Contour- Generalized mild distention. Palpation/Percussion:Tenderness- Abdomen is non-tender to palpation. Rigidity (guarding)- Abdomen is soft. Auscultation:Auscultation of the abdomen reveals - Bowel sounds normal.   Female Genitourinary Not done, not pertinent to present illness  Musculoskeletal On exam she is alert and oriented in no apparent distress. Her left knee looks fantastic. There is no swelling. Range of motion is 0 to 118 degrees. There is no tenderness or instability on the left knee. Right knee varus deformity, slight swelling, range 5 to 120 with tenderness medial greater than lateral, no instability.  RADIOGRAPHS: AP and lateral both knees show the prosthesis on the left in excellent position with no periprosthetic abnormalities. On the right she has bone on bone in the medial and patellofemoral compartments with large medial and posterior osteophytes.   Assessment & Plan Primary osteoarthritis of one knee (715.16)  Note: Plan is for a Right Total Knee Replacement by Dr. Lequita Halt.  Plan is to go home.  PCP - Dr. Berniece Andreas - Patient has been seen preoperatively and felt to be stable for surgery.  The patient will not receive TXA (tranexamic acid) due to: Breast Cancer  Please avoid the use of the LEFT ARM due to breast cancer surgery and lymphedema.  Signed electronically by Lauraine Rinne, III PA-C

## 2013-09-12 ENCOUNTER — Encounter (HOSPITAL_COMMUNITY)
Admission: RE | Disposition: A | Discharge status: Home Health | Payer: Self-pay | Source: Ambulatory Visit | Attending: Orthopedic Surgery

## 2013-09-12 ENCOUNTER — Inpatient Hospital Stay (HOSPITAL_COMMUNITY): Payer: Medicare Other | Admitting: Anesthesiology

## 2013-09-12 ENCOUNTER — Encounter (HOSPITAL_COMMUNITY): Payer: Medicare Other | Admitting: Anesthesiology

## 2013-09-12 ENCOUNTER — Encounter (HOSPITAL_COMMUNITY): Payer: Self-pay | Admitting: *Deleted

## 2013-09-12 ENCOUNTER — Inpatient Hospital Stay (HOSPITAL_COMMUNITY)
Admission: RE | Admit: 2013-09-12 | Discharge: 2013-09-14 | DRG: 470 | Disposition: A | Discharge status: Home Health | Payer: Medicare Other | Source: Ambulatory Visit | Attending: Orthopedic Surgery | Admitting: Orthopedic Surgery

## 2013-09-12 DIAGNOSIS — M1710 Unilateral primary osteoarthritis, unspecified knee: Principal | ICD-10-CM | POA: Diagnosis present

## 2013-09-12 DIAGNOSIS — M179 Osteoarthritis of knee, unspecified: Secondary | ICD-10-CM

## 2013-09-12 DIAGNOSIS — E119 Type 2 diabetes mellitus without complications: Secondary | ICD-10-CM | POA: Diagnosis not present

## 2013-09-12 DIAGNOSIS — G4730 Sleep apnea, unspecified: Secondary | ICD-10-CM | POA: Diagnosis present

## 2013-09-12 DIAGNOSIS — Z8249 Family history of ischemic heart disease and other diseases of the circulatory system: Secondary | ICD-10-CM

## 2013-09-12 DIAGNOSIS — IMO0002 Reserved for concepts with insufficient information to code with codable children: Secondary | ICD-10-CM | POA: Diagnosis not present

## 2013-09-12 DIAGNOSIS — Z6834 Body mass index (BMI) 34.0-34.9, adult: Secondary | ICD-10-CM

## 2013-09-12 DIAGNOSIS — Z96651 Presence of right artificial knee joint: Secondary | ICD-10-CM

## 2013-09-12 DIAGNOSIS — Z96659 Presence of unspecified artificial knee joint: Secondary | ICD-10-CM | POA: Diagnosis not present

## 2013-09-12 DIAGNOSIS — Z01812 Encounter for preprocedural laboratory examination: Secondary | ICD-10-CM

## 2013-09-12 DIAGNOSIS — D62 Acute posthemorrhagic anemia: Secondary | ICD-10-CM | POA: Diagnosis not present

## 2013-09-12 DIAGNOSIS — Z853 Personal history of malignant neoplasm of breast: Secondary | ICD-10-CM | POA: Diagnosis not present

## 2013-09-12 DIAGNOSIS — Z860100 Personal history of colon polyps, unspecified: Secondary | ICD-10-CM

## 2013-09-12 DIAGNOSIS — K219 Gastro-esophageal reflux disease without esophagitis: Secondary | ICD-10-CM | POA: Diagnosis present

## 2013-09-12 DIAGNOSIS — E7800 Pure hypercholesterolemia, unspecified: Secondary | ICD-10-CM | POA: Diagnosis present

## 2013-09-12 DIAGNOSIS — M25569 Pain in unspecified knee: Secondary | ICD-10-CM | POA: Diagnosis not present

## 2013-09-12 DIAGNOSIS — Z833 Family history of diabetes mellitus: Secondary | ICD-10-CM | POA: Diagnosis not present

## 2013-09-12 HISTORY — PX: TOTAL KNEE ARTHROPLASTY: SHX125

## 2013-09-12 LAB — GLUCOSE, CAPILLARY
Glucose-Capillary: 160 mg/dL — ABNORMAL HIGH (ref 70–99)
Glucose-Capillary: 139 mg/dL — ABNORMAL HIGH (ref 70–99)
Glucose-Capillary: 183 mg/dL — ABNORMAL HIGH (ref 70–99)

## 2013-09-12 LAB — TYPE AND SCREEN: ABO/RH(D): O POS

## 2013-09-12 SURGERY — ARTHROPLASTY, KNEE, TOTAL
Anesthesia: Spinal | Site: Knee | Laterality: Right | Wound class: Clean

## 2013-09-12 MED ORDER — BUPIVACAINE LIPOSOME 1.3 % IJ SUSP
20.0000 mL | Freq: Once | INTRAMUSCULAR | Status: DC
  Filled 2013-09-12: qty 20

## 2013-09-12 MED ORDER — METFORMIN HCL ER 500 MG PO TB24
1000.0000 mg | ORAL_TABLET | Freq: Every day | ORAL | Status: DC
  Administered 2013-09-13: 1000 mg via ORAL
  Filled 2013-09-12 (×2): qty 2

## 2013-09-12 MED ORDER — ONDANSETRON HCL 4 MG/2ML IJ SOLN
INTRAMUSCULAR | Status: DC | PRN
  Administered 2013-09-12: 4 mg via INTRAVENOUS

## 2013-09-12 MED ORDER — ATROPINE SULFATE 1 MG/ML IJ SOLN
INTRAMUSCULAR | Status: AC
  Filled 2013-09-12: qty 1

## 2013-09-12 MED ORDER — HYDROMORPHONE HCL PF 1 MG/ML IJ SOLN
INTRAMUSCULAR | Status: AC
  Filled 2013-09-12: qty 1

## 2013-09-12 MED ORDER — KETOROLAC TROMETHAMINE 30 MG/ML IJ SOLN
30.0000 mg | Freq: Once | INTRAMUSCULAR | Status: AC
  Administered 2013-09-12: 30 mg via INTRAVENOUS

## 2013-09-12 MED ORDER — RIVAROXABAN 10 MG PO TABS
10.0000 mg | ORAL_TABLET | Freq: Every day | ORAL | Status: DC
  Administered 2013-09-13 – 2013-09-14 (×2): 10 mg via ORAL
  Filled 2013-09-12 (×3): qty 1

## 2013-09-12 MED ORDER — PROPOFOL 10 MG/ML IV BOLUS
INTRAVENOUS | Status: AC
  Filled 2013-09-12: qty 20

## 2013-09-12 MED ORDER — BUPIVACAINE IN DEXTROSE 0.75-8.25 % IT SOLN
INTRATHECAL | Status: DC | PRN
  Administered 2013-09-12: 2 mL via INTRATHECAL

## 2013-09-12 MED ORDER — SODIUM CHLORIDE 0.9 % IJ SOLN
INTRAMUSCULAR | Status: AC
  Filled 2013-09-12: qty 50

## 2013-09-12 MED ORDER — PHENOL 1.4 % MT LIQD: 1.0000 | OROMUCOSAL | Status: DC | PRN

## 2013-09-12 MED ORDER — BUPIVACAINE LIPOSOME 1.3 % IJ SUSP
INTRAMUSCULAR | Status: DC | PRN
  Administered 2013-09-12: 20 mL

## 2013-09-12 MED ORDER — METHOCARBAMOL 500 MG PO TABS
500.0000 mg | ORAL_TABLET | Freq: Four times a day (QID) | ORAL | Status: DC | PRN
  Administered 2013-09-12 – 2013-09-14 (×5): 500 mg via ORAL
  Filled 2013-09-12 (×5): qty 1

## 2013-09-12 MED ORDER — BISACODYL 10 MG RE SUPP: 10.0000 mg | Freq: Every day | RECTAL | Status: DC | PRN

## 2013-09-12 MED ORDER — SODIUM CHLORIDE 0.9 % IV SOLN: INTRAVENOUS | Status: DC

## 2013-09-12 MED ORDER — MIDAZOLAM HCL 5 MG/5ML IJ SOLN
INTRAMUSCULAR | Status: DC | PRN
  Administered 2013-09-12: 2 mg via INTRAVENOUS

## 2013-09-12 MED ORDER — VANCOMYCIN HCL IN DEXTROSE 1-5 GM/200ML-% IV SOLN
INTRAVENOUS | Status: AC
  Filled 2013-09-12: qty 200

## 2013-09-12 MED ORDER — ACETAMINOPHEN 325 MG PO TABS: 650.0000 mg | ORAL_TABLET | Freq: Four times a day (QID) | ORAL | Status: DC | PRN

## 2013-09-12 MED ORDER — OXYCODONE HCL 5 MG/5ML PO SOLN
5.0000 mg | Freq: Once | ORAL | Status: DC | PRN
  Filled 2013-09-12: qty 5

## 2013-09-12 MED ORDER — ALPRAZOLAM 0.25 MG PO TABS
0.2500 mg | ORAL_TABLET | Freq: Every evening | ORAL | Status: DC | PRN
  Administered 2013-09-13: 0.25 mg via ORAL
  Filled 2013-09-12: qty 1

## 2013-09-12 MED ORDER — VANCOMYCIN HCL IN DEXTROSE 1-5 GM/200ML-% IV SOLN
1000.0000 mg | INTRAVENOUS | Status: AC
  Administered 2013-09-12: 1000 mg via INTRAVENOUS

## 2013-09-12 MED ORDER — MEPERIDINE HCL 50 MG/ML IJ SOLN: 6.2500 mg | INTRAMUSCULAR | Status: DC | PRN

## 2013-09-12 MED ORDER — PROMETHAZINE HCL 25 MG/ML IJ SOLN
6.2500 mg | INTRAMUSCULAR | Status: DC | PRN
  Administered 2013-09-12: 12.5 mg via INTRAVENOUS

## 2013-09-12 MED ORDER — ACETAMINOPHEN 10 MG/ML IV SOLN
1000.0000 mg | Freq: Once | INTRAVENOUS | Status: AC
  Administered 2013-09-12: 1000 mg via INTRAVENOUS
  Filled 2013-09-12: qty 100

## 2013-09-12 MED ORDER — MORPHINE SULFATE 2 MG/ML IJ SOLN
1.0000 mg | INTRAMUSCULAR | Status: DC | PRN
  Administered 2013-09-13 (×2): 2 mg via INTRAVENOUS
  Filled 2013-09-12 (×2): qty 1

## 2013-09-12 MED ORDER — ACETAMINOPHEN 650 MG RE SUPP: 650.0000 mg | Freq: Four times a day (QID) | RECTAL | Status: DC | PRN

## 2013-09-12 MED ORDER — METHOCARBAMOL 100 MG/ML IJ SOLN
500.0000 mg | Freq: Four times a day (QID) | INTRAVENOUS | Status: DC | PRN
  Administered 2013-09-12: 500 mg via INTRAVENOUS
  Filled 2013-09-12: qty 5

## 2013-09-12 MED ORDER — SODIUM CHLORIDE 0.9 % IJ SOLN
INTRAMUSCULAR | Status: AC
Start: 2013-09-12 — End: 2013-09-12
  Filled 2013-09-12: qty 10

## 2013-09-12 MED ORDER — DEXAMETHASONE SODIUM PHOSPHATE 10 MG/ML IJ SOLN: 10.0000 mg | Freq: Once | INTRAMUSCULAR | Status: DC

## 2013-09-12 MED ORDER — ACETAMINOPHEN 500 MG PO TABS
1000.0000 mg | ORAL_TABLET | Freq: Four times a day (QID) | ORAL | Status: AC
  Administered 2013-09-12 – 2013-09-13 (×4): 1000 mg via ORAL
  Filled 2013-09-12 (×4): qty 2

## 2013-09-12 MED ORDER — EPHEDRINE SULFATE 50 MG/ML IJ SOLN
INTRAMUSCULAR | Status: AC
  Filled 2013-09-12: qty 1

## 2013-09-12 MED ORDER — SODIUM CHLORIDE 0.9 % IR SOLN
Status: DC | PRN
  Administered 2013-09-12: 1000 mL

## 2013-09-12 MED ORDER — ZOLPIDEM TARTRATE 5 MG PO TABS: 5.0000 mg | ORAL_TABLET | Freq: Every evening | ORAL | Status: DC | PRN

## 2013-09-12 MED ORDER — INSULIN ASPART 100 UNIT/ML ~~LOC~~ SOLN
0.0000 [IU] | Freq: Three times a day (TID) | SUBCUTANEOUS | Status: DC
  Administered 2013-09-12: 5 [IU] via SUBCUTANEOUS
  Administered 2013-09-13: 3 [IU] via SUBCUTANEOUS
  Administered 2013-09-13: 2 [IU] via SUBCUTANEOUS
  Administered 2013-09-13 – 2013-09-14 (×3): 3 [IU] via SUBCUTANEOUS

## 2013-09-12 MED ORDER — MENTHOL 3 MG MT LOZG: 1.0000 | LOZENGE | OROMUCOSAL | Status: DC | PRN

## 2013-09-12 MED ORDER — KETOROLAC TROMETHAMINE 15 MG/ML IJ SOLN: 7.5000 mg | Freq: Four times a day (QID) | INTRAMUSCULAR | Status: AC | PRN

## 2013-09-12 MED ORDER — FLEET ENEMA 7-19 GM/118ML RE ENEM: 1.0000 | ENEMA | Freq: Once | RECTAL | Status: AC | PRN

## 2013-09-12 MED ORDER — SODIUM CHLORIDE 0.9 % IJ SOLN
INTRAMUSCULAR | Status: DC | PRN
  Administered 2013-09-12: 30 mL via INTRAVENOUS

## 2013-09-12 MED ORDER — METOCLOPRAMIDE HCL 5 MG/ML IJ SOLN: 5.0000 mg | Freq: Three times a day (TID) | INTRAMUSCULAR | Status: DC | PRN

## 2013-09-12 MED ORDER — PROPOFOL INFUSION 10 MG/ML OPTIME
INTRAVENOUS | Status: DC | PRN
  Administered 2013-09-12: 100 ug/kg/min via INTRAVENOUS

## 2013-09-12 MED ORDER — MIDAZOLAM HCL 2 MG/2ML IJ SOLN
INTRAMUSCULAR | Status: AC
  Filled 2013-09-12: qty 2

## 2013-09-12 MED ORDER — CYCLOSPORINE 0.05 % OP EMUL
1.0000 [drp] | Freq: Every day | OPHTHALMIC | Status: DC
  Administered 2013-09-12 – 2013-09-14 (×3): 1 [drp] via OPHTHALMIC
  Filled 2013-09-12 (×3): qty 1

## 2013-09-12 MED ORDER — POLYETHYLENE GLYCOL 3350 17 G PO PACK: 17.0000 g | PACK | Freq: Every day | ORAL | Status: DC | PRN

## 2013-09-12 MED ORDER — DIPHENHYDRAMINE HCL 12.5 MG/5ML PO ELIX: 12.5000 mg | ORAL_SOLUTION | ORAL | Status: DC | PRN

## 2013-09-12 MED ORDER — PANTOPRAZOLE SODIUM 40 MG PO TBEC
80.0000 mg | DELAYED_RELEASE_TABLET | Freq: Every day | ORAL | Status: DC
  Administered 2013-09-12: 80 mg via ORAL
  Filled 2013-09-12 (×3): qty 2

## 2013-09-12 MED ORDER — HYDROMORPHONE HCL PF 1 MG/ML IJ SOLN
0.2500 mg | INTRAMUSCULAR | Status: DC | PRN
  Administered 2013-09-12: 0.5 mg via INTRAVENOUS
  Administered 2013-09-12: 0.25 mg via INTRAVENOUS

## 2013-09-12 MED ORDER — BUPIVACAINE HCL (PF) 0.25 % IJ SOLN
INTRAMUSCULAR | Status: AC
  Filled 2013-09-12: qty 30

## 2013-09-12 MED ORDER — LIDOCAINE HCL (CARDIAC) 20 MG/ML IV SOLN
INTRAVENOUS | Status: DC | PRN
  Administered 2013-09-12: 100 mg via INTRAVENOUS

## 2013-09-12 MED ORDER — METOCLOPRAMIDE HCL 10 MG PO TABS: 5.0000 mg | ORAL_TABLET | Freq: Three times a day (TID) | ORAL | Status: DC | PRN

## 2013-09-12 MED ORDER — POTASSIUM CHLORIDE IN NACL 20-0.9 MEQ/L-% IV SOLN
INTRAVENOUS | Status: DC
Start: 2013-09-12 — End: 2013-09-14
  Administered 2013-09-12 – 2013-09-13 (×2): via INTRAVENOUS
  Filled 2013-09-12 (×4): qty 1000

## 2013-09-12 MED ORDER — LACTATED RINGERS IV SOLN
INTRAVENOUS | Status: DC | PRN
  Administered 2013-09-12: 08:00:00 via INTRAVENOUS

## 2013-09-12 MED ORDER — ONDANSETRON HCL 4 MG/2ML IJ SOLN
INTRAMUSCULAR | Status: AC
  Filled 2013-09-12: qty 2

## 2013-09-12 MED ORDER — OXYCODONE HCL 5 MG PO TABS: 5.0000 mg | ORAL_TABLET | Freq: Once | ORAL | Status: DC | PRN

## 2013-09-12 MED ORDER — GLYCOPYRROLATE 0.2 MG/ML IJ SOLN
INTRAMUSCULAR | Status: AC
  Filled 2013-09-12: qty 1

## 2013-09-12 MED ORDER — ONDANSETRON HCL 4 MG PO TABS: 4.0000 mg | ORAL_TABLET | Freq: Four times a day (QID) | ORAL | Status: DC | PRN

## 2013-09-12 MED ORDER — METFORMIN HCL ER 500 MG PO TB24
500.0000 mg | ORAL_TABLET | Freq: Every day | ORAL | Status: DC
  Administered 2013-09-12: 500 mg via ORAL
  Filled 2013-09-12 (×2): qty 1

## 2013-09-12 MED ORDER — PROMETHAZINE HCL 25 MG/ML IJ SOLN
INTRAMUSCULAR | Status: AC
  Filled 2013-09-12: qty 1

## 2013-09-12 MED ORDER — LOSARTAN POTASSIUM 50 MG PO TABS
50.0000 mg | ORAL_TABLET | Freq: Every day | ORAL | Status: DC
  Administered 2013-09-12 – 2013-09-13 (×2): 50 mg via ORAL
  Filled 2013-09-12 (×3): qty 1

## 2013-09-12 MED ORDER — SERTRALINE HCL 50 MG PO TABS
50.0000 mg | ORAL_TABLET | Freq: Every evening | ORAL | Status: DC
  Administered 2013-09-12 – 2013-09-13 (×2): 50 mg via ORAL
  Filled 2013-09-12 (×3): qty 1

## 2013-09-12 MED ORDER — 0.9 % SODIUM CHLORIDE (POUR BTL) OPTIME
TOPICAL | Status: DC | PRN
  Administered 2013-09-12: 500 mL

## 2013-09-12 MED ORDER — BUPIVACAINE HCL 0.25 % IJ SOLN
INTRAMUSCULAR | Status: DC | PRN
  Administered 2013-09-12: 20 mL

## 2013-09-12 MED ORDER — DOCUSATE SODIUM 100 MG PO CAPS
100.0000 mg | ORAL_CAPSULE | Freq: Two times a day (BID) | ORAL | Status: DC
  Administered 2013-09-12 – 2013-09-14 (×4): 100 mg via ORAL

## 2013-09-12 MED ORDER — ANASTROZOLE 1 MG PO TABS
1.0000 mg | ORAL_TABLET | Freq: Every day | ORAL | Status: DC
  Administered 2013-09-12 – 2013-09-14 (×3): 1 mg via ORAL
  Filled 2013-09-12 (×3): qty 1

## 2013-09-12 MED ORDER — KETOROLAC TROMETHAMINE 30 MG/ML IJ SOLN
INTRAMUSCULAR | Status: AC
  Filled 2013-09-12: qty 1

## 2013-09-12 MED ORDER — OXYCODONE HCL 5 MG PO TABS
5.0000 mg | ORAL_TABLET | ORAL | Status: DC | PRN
  Administered 2013-09-12: 10 mg via ORAL
  Administered 2013-09-12: 5 mg via ORAL
  Administered 2013-09-12 – 2013-09-14 (×12): 10 mg via ORAL
  Filled 2013-09-12 (×13): qty 2
  Filled 2013-09-12: qty 1

## 2013-09-12 MED ORDER — VANCOMYCIN HCL IN DEXTROSE 1-5 GM/200ML-% IV SOLN
1000.0000 mg | Freq: Two times a day (BID) | INTRAVENOUS | Status: AC
  Administered 2013-09-12: 1000 mg via INTRAVENOUS
  Filled 2013-09-12: qty 200

## 2013-09-12 MED ORDER — LIDOCAINE HCL (CARDIAC) 20 MG/ML IV SOLN
INTRAVENOUS | Status: AC
  Filled 2013-09-12: qty 5

## 2013-09-12 MED ORDER — TRAMADOL HCL 50 MG PO TABS: 50.0000 mg | ORAL_TABLET | Freq: Four times a day (QID) | ORAL | Status: DC | PRN

## 2013-09-12 MED ORDER — ONDANSETRON HCL 4 MG/2ML IJ SOLN: 4.0000 mg | Freq: Four times a day (QID) | INTRAMUSCULAR | Status: DC | PRN

## 2013-09-12 MED ORDER — CHLORHEXIDINE GLUCONATE 4 % EX LIQD: 60.0000 mL | Freq: Once | CUTANEOUS | Status: DC

## 2013-09-12 SURGICAL SUPPLY — 54 items
BAG ZIPLOCK 12X15 (MISCELLANEOUS) ×2 IMPLANT
BANDAGE ELASTIC 6 VELCRO ST LF (GAUZE/BANDAGES/DRESSINGS) ×2 IMPLANT
BANDAGE ESMARK 6X9 LF (GAUZE/BANDAGES/DRESSINGS) ×1 IMPLANT
BLADE SAG 18X100X1.27 (BLADE) ×2 IMPLANT
BLADE SAW SGTL 11.0X1.19X90.0M (BLADE) ×2 IMPLANT
BNDG ESMARK 6X9 LF (GAUZE/BANDAGES/DRESSINGS) ×2
BOWL SMART MIX CTS (DISPOSABLE) ×2 IMPLANT
CAPT RP KNEE ×2 IMPLANT
CEMENT HV SMART SET (Cement) ×4 IMPLANT
CUFF TOURN SGL QUICK 34 (TOURNIQUET CUFF) ×1
CUFF TRNQT CYL 34X4X40X1 (TOURNIQUET CUFF) ×1 IMPLANT
DECANTER SPIKE VIAL GLASS SM (MISCELLANEOUS) ×2 IMPLANT
DRAPE EXTREMITY T 121X128X90 (DRAPE) ×2 IMPLANT
DRAPE POUCH INSTRU U-SHP 10X18 (DRAPES) ×2 IMPLANT
DRAPE U-SHAPE 47X51 STRL (DRAPES) ×2 IMPLANT
DRSG ADAPTIC 3X8 NADH LF (GAUZE/BANDAGES/DRESSINGS) ×2 IMPLANT
DRSG PAD ABDOMINAL 8X10 ST (GAUZE/BANDAGES/DRESSINGS) ×2 IMPLANT
DURAPREP 26ML APPLICATOR (WOUND CARE) ×2 IMPLANT
ELECT REM PT RETURN 9FT ADLT (ELECTROSURGICAL) ×2
ELECTRODE REM PT RTRN 9FT ADLT (ELECTROSURGICAL) ×1 IMPLANT
EVACUATOR 1/8 PVC DRAIN (DRAIN) ×2 IMPLANT
FACESHIELD LNG OPTICON STERILE (SAFETY) ×10 IMPLANT
GLOVE BIO SURGEON STRL SZ7.5 (GLOVE) IMPLANT
GLOVE BIO SURGEON STRL SZ8 (GLOVE) ×2 IMPLANT
GLOVE BIOGEL PI IND STRL 8 (GLOVE) ×2 IMPLANT
GLOVE BIOGEL PI INDICATOR 8 (GLOVE) ×2
GLOVE SURG SS PI 6.5 STRL IVOR (GLOVE) IMPLANT
GOWN PREVENTION PLUS LG XLONG (DISPOSABLE) ×2 IMPLANT
GOWN STRL REIN XL XLG (GOWN DISPOSABLE) IMPLANT
HANDPIECE INTERPULSE COAX TIP (DISPOSABLE) ×1
IMMOBILIZER KNEE 20 (SOFTGOODS) ×2
IMMOBILIZER KNEE 20 THIGH 36 (SOFTGOODS) ×1 IMPLANT
KIT BASIN OR (CUSTOM PROCEDURE TRAY) ×2 IMPLANT
MANIFOLD NEPTUNE II (INSTRUMENTS) ×2 IMPLANT
NDL SAFETY ECLIPSE 18X1.5 (NEEDLE) ×2 IMPLANT
NEEDLE HYPO 18GX1.5 SHARP (NEEDLE) ×2
NS IRRIG 1000ML POUR BTL (IV SOLUTION) ×2 IMPLANT
PACK TOTAL JOINT (CUSTOM PROCEDURE TRAY) ×2 IMPLANT
PADDING CAST COTTON 6X4 STRL (CAST SUPPLIES) ×6 IMPLANT
POSITIONER SURGICAL ARM (MISCELLANEOUS) ×2 IMPLANT
SET HNDPC FAN SPRY TIP SCT (DISPOSABLE) ×1 IMPLANT
SPONGE GAUZE 4X4 12PLY (GAUZE/BANDAGES/DRESSINGS) ×2 IMPLANT
STRIP CLOSURE SKIN 1/2X4 (GAUZE/BANDAGES/DRESSINGS) ×4 IMPLANT
SUCTION FRAZIER 12FR DISP (SUCTIONS) ×2 IMPLANT
SUT MNCRL AB 4-0 PS2 18 (SUTURE) ×2 IMPLANT
SUT VIC AB 2-0 CT1 27 (SUTURE) ×3
SUT VIC AB 2-0 CT1 TAPERPNT 27 (SUTURE) ×3 IMPLANT
SUT VLOC 180 0 24IN GS25 (SUTURE) ×2 IMPLANT
SYR 20CC LL (SYRINGE) ×2 IMPLANT
SYR 50ML LL SCALE MARK (SYRINGE) ×2 IMPLANT
TOWEL OR 17X26 10 PK STRL BLUE (TOWEL DISPOSABLE) ×4 IMPLANT
TRAY FOLEY CATH 14FRSI W/METER (CATHETERS) ×2 IMPLANT
WATER STERILE IRR 1500ML POUR (IV SOLUTION) ×2 IMPLANT
WRAP KNEE MAXI GEL POST OP (GAUZE/BANDAGES/DRESSINGS) ×2 IMPLANT

## 2013-09-12 NOTE — Interval H&P Note (Signed)
History and Physical Interval Note:  09/12/2013 7:03 AM  Chelsey [name]  has presented today for surgery, with the diagnosis of OA OF RIGHT KNEE  The various methods of treatment have been discussed with the patient and family. After consideration of risks, benefits and other options for treatment, the patient has consented to  Procedure(s): RIGHT TOTAL KNEE ARTHROPLASTY (Right) as a surgical intervention .  The patient's history has been reviewed, patient examined, no change in status, stable for surgery.  I have reviewed the patient's chart and labs.  Questions were answered to the patient's satisfaction.     Loanne Drilling

## 2013-09-12 NOTE — Anesthesia Postprocedure Evaluation (Signed)
Anesthesia Post Note  Patient: Chelsey [name]  Procedure(s) Performed: Procedure(s) (LRB): RIGHT TOTAL KNEE ARTHROPLASTY (Right)  Anesthesia type: Spinal  Patient location: PACU  Post pain: Pain level controlled  Post assessment: Post-op Vital signs reviewed  Last Vitals: BP 128/80  Pulse 65  Temp(Src) 36.8 C (Oral)  Resp 16  Ht 5' 3.5" (1.613 m)  Wt 198 lb (89.812 kg)  BMI 34.52 kg/m2  SpO2 100%  Post vital signs: Reviewed  Level of consciousness: sedated  Complications: No apparent anesthesia complications

## 2013-09-12 NOTE — Evaluation (Signed)
Physical Therapy Evaluation Patient Details Name: Chelsey [name] MRN: [id number] DOB: [birth year] Today's Date: 09/12/2013 Time: 4098-1191 PT Time Calculation (min): 14 min  PT Assessment / Plan / Recommendation History of Present Illness  s/p right TKA  Clinical Impression  Pt will benefit from PT to address deficits below    PT Assessment  Patient needs continued PT services    Follow Up Recommendations  Home health PT    Does the patient have the potential to tolerate intense rehabilitation      Barriers to Discharge        Equipment Recommendations  None recommended by PT    Recommendations for Other Services     Frequency 7X/week    Precautions / Restrictions Precautions Precautions: Knee Required Braces or Orthoses: Knee Immobilizer - Right Knee Immobilizer - Right: Discontinue once straight leg raise with < 10 degree lag Restrictions Other Position/Activity Restrictions: WBAT   Pertinent Vitals/Pain VSS      Mobility  Bed Mobility Bed Mobility: Supine to Sit Supine to Sit: 5: Supervision Details for Bed Mobility Assistance: for  safety and lines Transfers Transfers: Sit to Stand;Stand to Sit Sit to Stand: 4: Min guard Stand to Sit: 4: Min guard Details for Transfer Assistance: cues for hand placement Ambulation/Gait Ambulation/Gait Assistance: 4: Min guard Ambulation Distance (Feet): 80 Feet Assistive device: Rolling walker Ambulation/Gait Assistance Details: cues for RW safety Gait Pattern: Step-to pattern;Step-through pattern    Exercises Total Joint Exercises Ankle Circles/Pumps: AROM;Both;5 reps Quad Sets: 5 reps;Both;AROM   PT Diagnosis: Difficulty walking  PT Problem List: Decreased strength;Decreased range of motion;Decreased activity tolerance;Decreased balance;Decreased mobility;Decreased knowledge of precautions PT Treatment Interventions: DME instruction;Gait training;Functional mobility training;Therapeutic activities;Therapeutic  exercise;Patient/family education     PT Goals(Current goals can be found in the care plan section) Acute Rehab PT Goals Patient Stated Goal: I PT Goal Formulation: With patient Time For Goal Achievement: 09/15/13 Potential to Achieve Goals: Good  Visit Information  Last PT Received On: 09/12/13 Assistance Needed: +1 History of Present Illness: s/p right TKA       Prior Functioning  Home Living Family/patient expects to be discharged to:: Private residence Living Arrangements: Spouse/significant other Type of Home: House Home Access: Stairs to enter Secretary/administrator of Steps: 3 Entrance Stairs-Rails: None Home Layout: Two level;Able to live on main level with bedroom/bathroom Alternate Level Stairs-Number of Steps: master is downstairs, wants to go upstairs Home Equipment: Dan Humphreys - 2 wheels Prior Function Level of Independence: Independent Communication Communication: No difficulties    Cognition  Cognition Arousal/Alertness: Awake/alert Behavior During Therapy: WFL for tasks assessed/performed Overall Cognitive Status: Within Functional Limits for tasks assessed    Extremity/Trunk Assessment Upper Extremity Assessment Upper Extremity Assessment: Overall WFL for tasks assessed;Defer to OT evaluation Lower Extremity Assessment Lower Extremity Assessment: RLE deficits/detail RLE Deficits / Details: able to do SLR; ankle WFL   Balance    End of Session PT - End of Session Equipment Utilized During Treatment:  (no KI I SLR) Activity Tolerance: Patient tolerated treatment well CPM Right Knee CPM Right Knee: Off  GP     [facility] 09/12/2013, 5:33 PM

## 2013-09-12 NOTE — Transfer of Care (Signed)
Immediate Anesthesia Transfer of Care Note  Patient: Chelsey [name]  Procedure(s) Performed: Procedure(s): RIGHT TOTAL KNEE ARTHROPLASTY (Right)  Patient Location: PACU  Anesthesia Type:Spinal  Level of Consciousness: awake, alert  and oriented  Airway & Oxygen Therapy: Patient Spontanous Breathing and Patient connected to face mask oxygen  Post-op Assessment: Report given to PACU RN and Post -op Vital signs reviewed and stable  Post vital signs: Reviewed and stable  Complications: No apparent anesthesia complications

## 2013-09-12 NOTE — Plan of Care (Signed)
Problem: Consults Goal: Diagnosis- Total Joint Replacement Primary Total Knee     

## 2013-09-12 NOTE — Progress Notes (Signed)
Pt placed on CPAP with 11cmH2O per pt's home settings with no oxygen bled in. Pt is using her home mask and tubing. Pt is tolerating CPAP well at this time with oxygen sat at 97%. RT will continue to monitor as needed.

## 2013-09-12 NOTE — Op Note (Signed)
Pre-operative diagnosis- Osteoarthritis  Right knee(s)  Post-operative diagnosis- Osteoarthritis Right knee(s)  Procedure-  Right  Total Knee Arthroplasty  Surgeon- Chelsey Rankin. Sharona Rovner, MD  Assistant- Dimitri Ped, PA-C   Anesthesia-  Spinal EBL-* No blood loss amount entered *  Drains Hemovac  Tourniquet time-  Total Tourniquet Time Documented: Thigh (Right) - 32 minutes Total: Thigh (Right) - 32 minutes    Complications- None  Condition-PACU - hemodynamically stable.   Brief Clinical Note  Chelsey [name] is a 67 y.o. year old female with end stage OA of her right knee with progressively worsening pain and dysfunction. She has constant pain, with activity and at rest and significant functional deficits with difficulties even with ADLs. She has had extensive non-op management including analgesics, injections of cortisone and viscosupplements, and home exercise program, but remains in significant pain with significant dysfunction.Radiographs show bone on bone arthritis medial and patellofemroal. She presents now for right Total Knee Arthroplasty.    Procedure in detail---   The patient is brought into the operating room and positioned supine on the operating table. After successful administration of  Spinal,   a tourniquet is placed high on the  Right thigh(s) and the lower extremity is prepped and draped in the usual sterile fashion. Time out is performed by the operating team and then the  Right lower extremity is wrapped in Esmarch, knee flexed and the tourniquet inflated to 300 mmHg.       A midline incision is made with a ten blade through the subcutaneous tissue to the level of the extensor mechanism. A fresh blade is used to make a medial parapatellar arthrotomy. Soft tissue over the proximal medial tibia is subperiosteally elevated to the joint line with a knife and into the semimembranosus bursa with a Cobb elevator. Soft tissue over the proximal lateral tibia is elevated with  attention being paid to avoiding the patellar tendon on the tibial tubercle. The patella is everted, knee flexed 90 degrees and the ACL and PCL are removed. Findings are bone on bone medial and patellofemoral with large global osteophytes.        The drill is used to create a starting hole in the distal femur and the canal is thoroughly irrigated with sterile saline to remove the fatty contents. The 5 degree Right  valgus alignment guide is placed into the femoral canal and the distal femoral cutting block is pinned to remove 10 mm off the distal femur. Resection is made with an oscillating saw.      The tibia is subluxed forward and the menisci are removed. The extramedullary alignment guide is placed referencing proximally at the medial aspect of the tibial tubercle and distally along the second metatarsal axis and tibial crest. The block is pinned to remove 2mm off the more deficient medial  side. Resection is made with an oscillating saw. Size 2.5is the most appropriate size for the tibia and the proximal tibia is prepared with the modular drill and keel punch for that size.      The femoral sizing guide is placed and size 3 is most appropriate. Rotation is marked off the epicondylar axis and confirmed by creating a rectangular flexion gap at 90 degrees. The size 3 cutting block is pinned in this rotation and the anterior, posterior and chamfer cuts are made with the oscillating saw. The intercondylar block is then placed and that cut is made.      Trial size 2.5 tibial component, trial size 3 posterior  stabilized femur and a 15  mm posterior stabilized rotating platform insert trial is placed. Full extension is achieved with excellent varus/valgus and anterior/posterior balance throughout full range of motion. The patella is everted and thickness measured to be 22  mm. Free hand resection is taken to 12 mm, a 35 template is placed, lug holes are drilled, trial patella is placed, and it tracks normally.  Osteophytes are removed off the posterior femur with the trial in place. All trials are removed and the cut bone surfaces prepared with pulsatile lavage. Cement is mixed and once ready for implantation, the size 2.5 tibial implant, size  3 posterior stabilized femoral component, and the size 35 patella are cemented in place and the patella is held with the clamp. The trial insert is placed and the knee held in full extension. The Exparel (20 ml mixed with 30 ml saline) and .25% Bupivicaine, are injected into the extensor mechanism, posterior capsule, medial and lateral gutters and subcutaneous tissues.  All extruded cement is removed and once the cement is hard the permanent 15 mm posterior stabilized rotating platform insert is placed into the tibial tray.      The wound is copiously irrigated with saline solution and the extensor mechanism closed over a hemovac drain with #1 PDS suture. The tourniquet is released for a total tourniquet time of 32  minutes. Flexion against gravity is 140 degrees and the patella tracks normally. Subcutaneous tissue is closed with 2.0 vicryl and subcuticular with running 4.0 Monocryl. The incision is cleaned and dried and steri-strips and a bulky sterile dressing are applied. The limb is placed into a knee immobilizer and the patient is awakened and transported to recovery in stable condition.      Please note that a surgical assistant was a medical necessity for this procedure in order to perform it in a safe and expeditious manner. Surgical assistant was necessary to retract the ligaments and vital neurovascular structures to prevent injury to them and also necessary for proper positioning of the limb to allow for anatomic placement of the prosthesis.   Chelsey Rankin Kresta Templeman, MD    09/12/2013, 9:22 AM

## 2013-09-12 NOTE — Anesthesia Preprocedure Evaluation (Addendum)
Anesthesia Evaluation  Patient identified by MRN, date of birth, ID band Patient awake    Reviewed: Allergy & Precautions, H&P , NPO status , Patient's Chart, lab work & pertinent test results  Airway Mallampati: III TM Distance: >3 FB Neck ROM: Full    Dental  (+) Teeth Intact and Dental Advisory Given   Pulmonary neg pulmonary ROS, sleep apnea , pneumonia -,  breath sounds clear to auscultation  Pulmonary exam normal       Cardiovascular hypertension, Pt. on medications Rhythm:Regular Rate:Normal     Neuro/Psych PSYCHIATRIC DISORDERS negative neurological ROS     GI/Hepatic Neg liver ROS, GERD-  Medicated,  Endo/Other  diabetes, Type 2  Renal/GU negative Renal ROS     Musculoskeletal negative musculoskeletal ROS (+)   Abdominal   Peds  Hematology  (+) anemia ,   Anesthesia Other Findings   Reproductive/Obstetrics negative OB ROS Prior left mastectomy with recostruction                          Anesthesia Physical  Anesthesia Plan  ASA: II  Anesthesia Plan: Spinal   Post-op Pain Management:    Induction: Intravenous  Airway Management Planned: Simple Face Mask  Additional Equipment:   Intra-op Plan:   Post-operative Plan:   Informed Consent: I have reviewed the patients History and Physical, chart, labs and discussed the procedure including the risks, benefits and alternatives for the proposed anesthesia with the patient or authorized representative who has indicated his/her understanding and acceptance.   Dental advisory given  Plan Discussed with: CRNA  Anesthesia Plan Comments:        Anesthesia Quick Evaluation

## 2013-09-12 NOTE — Progress Notes (Signed)
Utilization review completed.  

## 2013-09-12 NOTE — Anesthesia Procedure Notes (Signed)
Spinal  Patient location during procedure: OR Start time: 09/12/2013 8:25 AM End time: 09/12/2013 8:35 AM Staffing Anesthesiologist: Lewie Loron R Performed by: anesthesiologist  Preanesthetic Checklist Completed: patient identified, site marked, surgical consent, pre-op evaluation, timeout performed, IV checked, risks and benefits discussed and monitors and equipment checked Spinal Block Patient position: sitting Prep: ChloraPrep Patient monitoring: heart rate, continuous pulse ox and blood pressure Approach: right paramedian Injection technique: single-shot Needle Needle type: Sprotte  Needle gauge: 24 G Needle length: 9 cm Assessment Sensory level: T8 Additional Notes Expiration date of kit checked and confirmed. Patient tolerated procedure well, without complications.

## 2013-09-12 NOTE — H&P (View-Only) (Signed)
Chelsey [name]  DOB: 01/23/1946 Married / Language: English / Race: White Female  Date of Admission:  09/12/2013  Chief Complaint:  Right Knee Pain  History of Present Illness The patient is a 67 year old female who comes in for a preoperative History and Physical. The patient is scheduled for a right total knee arthroplasty to be performed by Dr. Frank V. Aluisio, MD at [facility] on 09/12/2013. The patient is a 67 year old female presenting for a post-operative visit. The patient comes in several months out from left total knee arthroplasty. The patient states that she is doing well at this time. The pain is under fair control at this time and describe their pain as mild to moderate. They are currently on Hydrocodone (on rare occasions) for their pain. The patient is currently doing home exercise program. Note for "Post TKA": She states that she still has quite a bit of burning pain and aching in both lower legs. The sometimes radiates to the left lateral hip, but for the most part is from the knee down. She states she has also had quite a bit of instability and pain in the right knee, and feels like she may be ready to schedule a total knee for that side. She is still taking the voltaren on occasion, and feels like that usually helps for a couple of days. She feels like the left knee is doing a lot better. The right knee is the main thing holding her back. If she is standing for a long time or walking for a long time she will get a burning feeling in both legs. She is concerned that the Crestor or the Arimidex might be contributing to some of this. She is ready to proceed with surgery on the right knee at this time. They have been treated conservatively in the past for the above stated problem and despite conservative measures, they continue to have progressive pain and severe functional limitations and dysfunction. They have failed non-operative management including home  exercise, medications, and injections. It is felt that they would benefit from undergoing total joint replacement. Risks and benefits of the procedure have been discussed with the patient and they elect to proceed with surgery. There are no active contraindications to surgery such as ongoing infection or rapidly progressive neurological disease.      Problem List Primary osteoarthritis of one knee S/P total knee replacement (V43.65). left    Allergies Latex. Rash. Penicillin G Sodium *PENICILLINS*. Childhood RXN    Family History Diabetes Mellitus. First Degree Relatives. mother Hypertension. mother    Social History Tobacco use. Never smoker. never smoker Exercise. Exercises never Drug/Alcohol Rehab (Previously). no Living situation. live with spouse Illicit drug use. no Drug/Alcohol Rehab (Currently). no Alcohol use. current drinker; drinks wine; 5-7 per week Current work status. working full time Children. 2 Number of flights of stairs before winded. 2-3 Marital status. married Tobacco / smoke exposure. no Pain Contract. no    Medication History Xanax (0.25MG Tablet, Oral) Active. Arimidex (1MG Tablet, Oral) Active. Restasis (0.05% Emulsion, Ophthalmic) Active. Diclofenac Sodium (75MG Tablet DR, 1 (one) Tablet DR Oral two times daily, Taken starting 03/11/2013) Active. Losartan Potassium (50MG Tablet, Oral) Active. MetFORMIN HCl (500MG Tablet ER 24HR, Oral) Active. NexIUM (40MG Capsule DR, Oral) Active. Crestor (10MG Tablet, Oral) Active. Sertraline HCl (50MG Tablet, Oral) Active.  Past Surgical History Mastectomy. left Cesarean Delivery. 3 or more times Breast Reconstruction. left Mammoplasty; Reduction. right Colon Polyp Removal - Colonoscopy   Total Knee Replacement - Left. Date: 12/2012.  Medical History Hypercholesterolemia High blood pressure Gastroesophageal Reflux Disease Anxiety Disorder Diabetes Mellitus, Type  II Breast Cancer. Left Sided Pneumonia Sleep Apnea. uses CPAP Diverticulosis    Review of Systems General:Not Present- Chills, Fever, Night Sweats, Fatigue, Weight Gain, Weight Loss and Memory Loss. Skin:Not Present- Hives, Itching, Rash, Eczema and Lesions. HEENT:Not Present- Tinnitus, Headache, Double Vision, Visual Loss, Hearing Loss and Dentures. Respiratory:Not Present- Shortness of breath with exertion, Shortness of breath at rest, Allergies, Coughing up blood and Chronic Cough. Cardiovascular:Not Present- Chest Pain, Racing/skipping heartbeats, Difficulty Breathing Lying Down, Murmur, Swelling and Palpitations. Gastrointestinal:Not Present- Bloody Stool, Heartburn, Abdominal Pain, Vomiting, Nausea, Constipation, Diarrhea, Difficulty Swallowing, Jaundice and Loss of appetitie. Female Genitourinary:Not Present- Blood in Urine, Urinary frequency, Weak urinary stream, Discharge, Flank Pain, Incontinence, Painful Urination, Urgency, Urinary Retention and Urinating at Night. Musculoskeletal:Present- Joint Pain. Not Present- Muscle Weakness, Muscle Pain, Joint Swelling, Back Pain, Morning Stiffness and Spasms. Neurological:Not Present- Tremor, Dizziness, Blackout spells, Paralysis, Difficulty with balance and Weakness. Psychiatric:Not Present- Insomnia.    Vitals Pulse: 68 (Regular) Resp.: 14 (Unlabored) BP: 118/62 (Sitting, Right Arm, Standard)     Physical Exam The physical exam findings are as follows:   General Mental Status - Alert, cooperative and good historian. General Appearance- pleasant. Not in acute distress. Orientation- Oriented X3. Build & Nutrition- Well nourished and Well developed.   Head and Neck Head- normocephalic, atraumatic . Neck Global Assessment- supple. no bruit auscultated on the right and no bruit auscultated on the left.   Eye Pupil- Bilateral- Regular and Round. Motion- Bilateral- EOMI.   Chest and  Lung Exam Auscultation: Breath sounds:- clear at anterior chest wall and - clear at posterior chest wall. Adventitious sounds:- No Adventitious sounds.   Cardiovascular Auscultation:Rhythm- Regular rate and rhythm. Heart Sounds- S1 WNL and S2 WNL. Murmurs & Other Heart Sounds:Auscultation of the heart reveals - No Murmurs.   Abdomen Inspection:Contour- Generalized mild distention. Palpation/Percussion:Tenderness- Abdomen is non-tender to palpation. Rigidity (guarding)- Abdomen is soft. Auscultation:Auscultation of the abdomen reveals - Bowel sounds normal.   Female Genitourinary Not done, not pertinent to present illness  Musculoskeletal On exam she is alert and oriented in no apparent distress. Her left knee looks fantastic. There is no swelling. Range of motion is 0 to 118 degrees. There is no tenderness or instability on the left knee. Right knee varus deformity, slight swelling, range 5 to 120 with tenderness medial greater than lateral, no instability.  RADIOGRAPHS: AP and lateral both knees show the prosthesis on the left in excellent position with no periprosthetic abnormalities. On the right she has bone on bone in the medial and patellofemoral compartments with large medial and posterior osteophytes.   Assessment & Plan Primary osteoarthritis of one knee (715.16)  Note: Plan is for a Right Total Knee Replacement by Dr. Aluisio.  Plan is to go home.  PCP - Dr. Wanda Panosh - Patient has been seen preoperatively and felt to be stable for surgery.  The patient will not receive TXA (tranexamic acid) due to: Breast Cancer  Please avoid the use of the LEFT ARM due to breast cancer surgery and lymphedema.  Signed electronically by Alexzandrew L Perkins, III PA-C 

## 2013-09-13 DIAGNOSIS — D62 Acute posthemorrhagic anemia: Secondary | ICD-10-CM

## 2013-09-13 LAB — BASIC METABOLIC PANEL
Calcium: 8.5 mg/dL (ref 8.4–10.5)
Creatinine, Ser: 0.58 mg/dL (ref 0.50–1.10)
GFR calc Af Amer: >90 mL/min (ref >90)
GFR calc non Af Amer: >90 mL/min (ref >90)
Glucose, Bld: 155 mg/dL — ABNORMAL HIGH (ref 70–99)
Potassium: 3.8 mEq/L (ref 3.5–5.1)
Sodium: 131 mEq/L — ABNORMAL LOW (ref 135–145)

## 2013-09-13 LAB — CBC
HCT: 27.8 % — ABNORMAL LOW (ref 36.0–46.0)
Hemoglobin: 9.7 g/dL — ABNORMAL LOW (ref 12.0–15.0)
MCH: 30.6 pg (ref 26.0–34.0)
MCHC: 34.9 g/dL (ref 30.0–36.0)
Platelets: 160 10*3/uL (ref 150–400)
WBC: 7.3 10*3/uL (ref 4.0–10.5)

## 2013-09-13 LAB — GLUCOSE, CAPILLARY
Glucose-Capillary: 156 mg/dL — ABNORMAL HIGH (ref 70–99)
Glucose-Capillary: 192 mg/dL — ABNORMAL HIGH (ref 70–99)

## 2013-09-13 MED ORDER — NON FORMULARY: 40.0000 mg | Freq: Every day | Status: DC

## 2013-09-13 MED ORDER — ESOMEPRAZOLE MAGNESIUM 40 MG PO CPDR
40.0000 mg | DELAYED_RELEASE_CAPSULE | Freq: Every day | ORAL | Status: DC
  Administered 2013-09-13 – 2013-09-14 (×2): 40 mg via ORAL
  Filled 2013-09-13 (×2): qty 1

## 2013-09-13 NOTE — Progress Notes (Signed)
Physical Therapy Treatment Patient Details Name: Chelsey [name] MRN: [id number] DOB: Oct 16, 1945 Today's Date: 09/13/2013 Time: 0922-0950 PT Time Calculation (min): 28 min  PT Assessment / Plan / Recommendation  History of Present Illness s/p right TKA   PT Comments   POD # 1 am session.  Pt in bed.  Applied KI and instructed pt on use.  Assisted pt OOB to amb in hallway then positioned in recliner to perform TE's.  Applied ICE.   Follow Up Recommendations  Home health PT     Does the patient have the potential to tolerate intense rehabilitation     Barriers to Discharge        Equipment Recommendations  None recommended by PT    Recommendations for Other Services    Frequency 7X/week   Progress towards PT Goals Progress towards PT goals: Progressing toward goals  Plan      Precautions / Restrictions Precautions Precautions: Knee Precaution Comments: instructed pt on KI use for amb Required Braces or Orthoses: Knee Immobilizer - Right Knee Immobilizer - Right: Discontinue once straight leg raise with < 10 degree lag Restrictions Weight Bearing Restrictions: No Other Position/Activity Restrictions: WBAT    Pertinent Vitals/Pain C/o 5/10 pain Pre medicated ICE applied    Mobility  Bed Mobility Bed Mobility: Supine to Sit Supine to Sit: 5: Supervision Details for Bed Mobility Assistance: for  safety and lines Transfers Transfers: Sit to Stand;Stand to Sit Sit to Stand: 4: Min guard;From bed Stand to Sit: 4: Min guard;To chair/3-in-1 Details for Transfer Assistance: cues for hand placement and increased time Ambulation/Gait Ambulation/Gait Assistance: 4: Min guard Ambulation Distance (Feet): 55 Feet Assistive device: Rolling walker Ambulation/Gait Assistance Details: <25% VC's on safety with turns and backward gait Gait Pattern: Step-to pattern;Step-through pattern Gait velocity: decreased    Exercises   Total Knee Replacement TE's 10 reps B LE ankle pumps 10  reps knee presses 10 reps heel slides  10 reps SAQ's 10 reps SLR's 10 reps ABD Followed by ICE    PT Goals (current goals can now be found in the care plan section)    Visit Information  Last PT Received On: 09/13/13 History of Present Illness: s/p right TKA    Subjective Data      Cognition       Balance     End of Session PT - End of Session Equipment Utilized During Treatment: Gait belt Activity Tolerance: Patient tolerated treatment well Patient left: in chair;with call bell/phone within reach;with family/visitor present   Felecia Shelling  PTA [facility]  Acute  Rehab Pager      [phone]

## 2013-09-13 NOTE — Progress Notes (Signed)
OT Cancellation Note  Patient Details Name: Chelsey [name] MRN: [id number] DOB: February 06, 1946   Cancelled Treatment:     Pt screened for OT.  She had other knee done in March of this year.  No needs  Jennaya Pogue 09/13/2013, 8:23 AM Marica Otter, OTR/L [phone] 09/13/2013

## 2013-09-13 NOTE — Progress Notes (Signed)
Pt placed on CPAP with 11cmH2O with no oxygen bled in per pt's home settings. Sterile water was added for humidification. Pt is tolerating CPAP well at this time. RN notified. RT will continue to monitor as needed.

## 2013-09-13 NOTE — Progress Notes (Signed)
   Subjective: 1 Day Post-Op Procedure(s) (LRB): RIGHT TOTAL KNEE ARTHROPLASTY (Right) Patient reports pain as mild.   Patient seen in rounds with Dr. Lequita Halt. Husband in room at bedside.  Not much rest last night. Patient is well, and has had no acute complaints or problems We will start therapy today.  Plan is to go Home after hospital stay.  Objective: Vital signs in last 24 hours: Temp:  [97.3 F (36.3 C)-100 F (37.8 C)] 100 F (37.8 C) (12/02 1610) Pulse Rate:  [39-79] 79 (12/02 0637) Resp:  [11-26] 15 (12/02 0800) BP: (67-128)/(30-80) 112/50 mmHg (12/02 0637) SpO2:  [91 %-100 %] 95 % (12/02 0637) FiO2 (%):  [100 %] 100 % (12/01 1230) Weight:  [89.812 kg (198 lb)] 89.812 kg (198 lb) (12/01 1230)  Intake/Output from previous day:  Intake/Output Summary (Last 24 hours) at 09/13/13 0856 Last data filed at 09/13/13 0853  Gross per 24 hour  Intake 5372.5 ml  Output   2492 ml  Net 2880.5 ml    Intake/Output this shift: Total I/O In: 320 [P.O.:320] Out: -   Labs:  Recent Labs  09/13/13 0538  HGB 9.7*    Recent Labs  09/13/13 0538  WBC 7.3  RBC 3.17*  HCT 27.8*  PLT 160    Recent Labs  09/13/13 0538  NA 131*  K 3.8  CL 98  CO2 26  BUN 8  CREATININE 0.58  GLUCOSE 155*  CALCIUM 8.5   No results found for this basename: LABPT, INR,  in the last 72 hours  EXAM General - Patient is Alert, Appropriate and Oriented Extremity - Neurovascular intact Sensation intact distally Dorsiflexion/Plantar flexion intact Dressing - dressing C/D/I Motor Function - intact, moving foot and toes well on exam.  Hemovac pulled without difficulty.  Past Medical History  Diagnosis Date  . Diabetes mellitus   . Allergy   . Osteoarthritis   . Hypertension   . Hyperlipidemia   . GERD (gastroesophageal reflux disease)   . Pneumonia 07/28/2011  . Sleep apnea     setting of 10  . Breast cancer   . Colitis   . Sinusitis   . Aphthous ulcer of mouth 2010   related to chemotherapy   . Carpal tunnel syndrome of right wrist   . Squamous cell carcinoma     left leg  . Colon polyp   . Diverticulosis   . Lymphedema 08-31-13    left arm    Assessment/Plan: 1 Day Post-Op Procedure(s) (LRB): RIGHT TOTAL KNEE ARTHROPLASTY (Right) Principal Problem:   OA (osteoarthritis) of knee  Estimated body mass index is 34.52 kg/(m^2) as calculated from the following:   Height as of this encounter: 5' 3.5" (1.613 m).   Weight as of this encounter: 89.812 kg (198 lb). Advance diet Up with therapy Plan for discharge tomorrow Discharge home with home health  DVT Prophylaxis - Xarelto Weight-Bearing as tolerated to right leg D/C O2 and Pulse OX and try on Room Air  Chelsey [name] 09/13/2013, 8:56 AM

## 2013-09-14 LAB — BASIC METABOLIC PANEL
CO2: 24 mEq/L (ref 19–32)
Calcium: 8.9 mg/dL (ref 8.4–10.5)
Creatinine, Ser: 0.52 mg/dL (ref 0.50–1.10)
GFR calc non Af Amer: >90 mL/min (ref >90)
Glucose, Bld: 186 mg/dL — ABNORMAL HIGH (ref 70–99)

## 2013-09-14 LAB — CBC
HCT: 27.3 % — ABNORMAL LOW (ref 36.0–46.0)
MCH: 30.5 pg (ref 26.0–34.0)
MCHC: 35.2 g/dL (ref 30.0–36.0)
MCV: 86.7 fL (ref 78.0–100.0)
Platelets: 154 10*3/uL (ref 150–400)
RBC: 3.15 MIL/uL — ABNORMAL LOW (ref 3.87–5.11)

## 2013-09-14 LAB — GLUCOSE, CAPILLARY: Glucose-Capillary: 170 mg/dL — ABNORMAL HIGH (ref 70–99)

## 2013-09-14 MED ORDER — RIVAROXABAN 10 MG PO TABS: 10.0000 mg | ORAL_TABLET | Freq: Every day | ORAL | Status: DC

## 2013-09-14 MED ORDER — OXYCODONE HCL 5 MG PO TABS: 5.0000 mg | ORAL_TABLET | ORAL | Status: DC | PRN

## 2013-09-14 MED ORDER — TRAMADOL HCL 50 MG PO TABS: 50.0000 mg | ORAL_TABLET | Freq: Four times a day (QID) | ORAL | Status: DC | PRN

## 2013-09-14 MED ORDER — METHOCARBAMOL 500 MG PO TABS: 500.0000 mg | ORAL_TABLET | Freq: Four times a day (QID) | ORAL | Status: DC | PRN

## 2013-09-14 NOTE — Progress Notes (Signed)
   Subjective: 2 Days Post-Op Procedure(s) (LRB): RIGHT TOTAL KNEE ARTHROPLASTY (Right) Patient reports pain as mild.   Patient seen in rounds with Dr. Lequita Halt. Much better today. Patient is well, and has had no acute complaints or problems Patient is ready to go home  Objective: Vital signs in last 24 hours: Temp:  [99.7 F (37.6 C)-102.1 F (38.9 C)] 100.6 F (38.1 C) (12/03 0607) Pulse Rate:  [67-80] 79 (12/03 0607) Resp:  [15-16] 16 (12/03 0607) BP: (110-134)/(61-77) 123/64 mmHg (12/03 0607) SpO2:  [95 %-98 %] 95 % (12/03 0607)  Intake/Output from previous day:  Intake/Output Summary (Last 24 hours) at 09/14/13 0802 Last data filed at 09/14/13 1610  Gross per 24 hour  Intake   1040 ml  Output    925 ml  Net    115 ml    Intake/Output this shift: Total I/O In: 240 [P.O.:240] Out: -   Labs:  Recent Labs  09/13/13 0538 09/14/13 0457  HGB 9.7* 9.6*    Recent Labs  09/13/13 0538 09/14/13 0457  WBC 7.3 9.0  RBC 3.17* 3.15*  HCT 27.8* 27.3*  PLT 160 154    Recent Labs  09/13/13 0538 09/14/13 0457  NA 131* 129*  K 3.8 3.3*  CL 98 94*  CO2 26 24  BUN 8 6  CREATININE 0.58 0.52  GLUCOSE 155* 186*  CALCIUM 8.5 8.9   No results found for this basename: LABPT, INR,  in the last 72 hours  EXAM: General - Patient is Alert, Appropriate and Oriented Extremity - Neurovascular intact Sensation intact distally Dorsiflexion/Plantar flexion intact Incision - clean, dry, no drainage, healing Motor Function - intact, moving foot and toes well on exam.   Assessment/Plan: 2 Days Post-Op Procedure(s) (LRB): RIGHT TOTAL KNEE ARTHROPLASTY (Right) Procedure(s) (LRB): RIGHT TOTAL KNEE ARTHROPLASTY (Right) Past Medical History  Diagnosis Date  . Diabetes mellitus   . Allergy   . Osteoarthritis   . Hypertension   . Hyperlipidemia   . GERD (gastroesophageal reflux disease)   . Pneumonia 07/28/2011  . Sleep apnea     setting of 10  . Breast cancer   .  Colitis   . Sinusitis   . Aphthous ulcer of mouth 2010    related to chemotherapy   . Carpal tunnel syndrome of right wrist   . Squamous cell carcinoma     left leg  . Colon polyp   . Diverticulosis   . Lymphedema 08-31-13    left arm   Principal Problem:   OA (osteoarthritis) of knee Active Problems:   Postoperative anemia due to acute blood loss  Estimated body mass index is 34.52 kg/(m^2) as calculated from the following:   Height as of this encounter: 5' 3.5" (1.613 m).   Weight as of this encounter: 89.812 kg (198 lb). Advance diet Up with therapy Discharge home with home health Diet - Cardiac diet and Diabetic diet Follow up - in 2 weeks Activity - WBAT Disposition - Home Condition Upon Discharge - Good D/C Meds - See DC Summary DVT Prophylaxis - Xarelto  PERKINS, ALEXZANDREW 09/14/2013, 8:02 AM

## 2013-09-14 NOTE — Care Management Note (Signed)
    Page 1 of 1   09/14/2013     7:25:00 PM   CARE MANAGEMENT NOTE 09/14/2013  Patient:  Chelsey [name], Chelsey [name]   Account Number:  [id number]  Date Initiated:  09/14/2013  Documentation initiated by:  Colleen Can  Subjective/Objective Assessment:   dx Total rt knee replacemnt    Per-arranged for [facility] to provide Hh services which will start 09/15/2013.     Action/Plan:   CM spoke with patient. Plans are for patient to return to her home in [place] where spouse will be caregiver. Already has DME-RW. States [name] will provide [facility] services.   Anticipated DC Date:  09/14/2013   Anticipated DC Plan:  HOME W HOME HEALTH SERVICES      DC Planning Services  CM consult      [facility]  HOME HEALTH   Choice offered to / List presented to:  C-1 Patient        HH arranged  HH-2 PT      [facility] agency  [facility]   Status of service:  Completed, signed off Medicare Important Message given?  NA - LOS <3 / Initial given by admissions (If response is "NO", the following Medicare IM given date fields will be blank) Date Medicare IM given:   Date Additional Medicare IM given:    Discharge Disposition:  HOME W HOME HEALTH SERVICES  Per UR Regulation:    If discussed at Long Length of Stay Meetings, dates discussed:    Comments:

## 2013-09-14 NOTE — Plan of Care (Signed)
Problem: Consults Goal: Diagnosis- Total Joint Replacement Outcome: Completed/Met Date Met:  09/14/13 Primary Total Knee RIGHT

## 2013-09-14 NOTE — Progress Notes (Signed)
Physical Therapy Treatment Patient Details Name: Chelsey [name] MRN: [id number] DOB: April 30, 1946 Today's Date: 09/14/2013 Time: 2536-6440 PT Time Calculation (min): 26 min  PT Assessment / Plan / Recommendation  History of Present Illness s/p right TKA   PT Comments   progressing  Follow Up Recommendations  Home health PT     Does the patient have the potential to tolerate intense rehabilitation     Barriers to Discharge        Equipment Recommendations  None recommended by PT    Recommendations for Other Services    Frequency 7X/week   Progress towards PT Goals Progress towards PT goals: Progressing toward goals  Plan Current plan remains appropriate    Precautions / Restrictions Precautions Precautions: Knee Precaution Comments: instructed pt on KI use for amb Required Braces or Orthoses: Knee Immobilizer - Right Knee Immobilizer - Right: Discontinue once straight leg raise with < 10 degree lag Restrictions Weight Bearing Restrictions: No Other Position/Activity Restrictions: WBAT   Pertinent Vitals/Pain 510, had meds just before session;     Mobility  Bed Mobility Bed Mobility: Supine to Sit Supine to Sit: 4: Min assist Details for Bed Mobility Assistance: min with RLE Transfers Transfers: Sit to Stand;Stand to Sit Sit to Stand: 5: Supervision Stand to Sit: 5: Supervision Details for Transfer Assistance: cues for hand placement and increased time Ambulation/Gait Ambulation/Gait Assistance: 5: Supervision;4: Min guard Ambulation Distance (Feet): 90 Feet Assistive device: Rolling walker Ambulation/Gait Assistance Details: cues for sequence and use of UEs for pain control Gait Pattern: Step-to pattern;Step-through pattern Gait velocity: decreased Stairs: Yes Stairs Assistance: 4: Min guard;4: Min assist Stairs Assistance Details (indicate cue type and reason): cues for sequence and safe use of walker Stair Management Technique: No rails;Backwards;With walker;Step  to pattern Number of Stairs: 3 (times 2)    Exercises Total Joint Exercises Ankle Circles/Pumps: AROM;Both;10 reps Quad Sets: AROM;Strengthening;Both;10 reps Heel Slides: AAROM;Right;10 reps Straight Leg Raises: AAROM;Right;10 reps Goniometric ROM: 45   PT Diagnosis:    PT Problem List:   PT Treatment Interventions:     PT Goals (current goals can now be found in the care plan section) Acute Rehab PT Goals Patient Stated Goal: I PT Goal Formulation: With patient Time For Goal Achievement: 09/15/13 Potential to Achieve Goals: Good  Visit Information  Last PT Received On: 09/14/13 Assistance Needed: +1 History of Present Illness: s/p right TKA    Subjective Data  Subjective: let's get it over with Patient Stated Goal: I   Cognition  Cognition Arousal/Alertness: Awake/alert Behavior During Therapy: WFL for tasks assessed/performed Overall Cognitive Status: Within Functional Limits for tasks assessed    Balance     End of Session PT - End of Session Equipment Utilized During Treatment: Gait belt;Right knee immobilizer Activity Tolerance: Patient tolerated treatment well Patient left: Other (comment);with call bell/phone within reach;with family/visitor present (EOB) Nurse Communication: Mobility status   GP     [facility] Doctor [facility] 09/14/2013, 10:24 AM

## 2013-09-14 NOTE — Discharge Summary (Signed)
Physician Discharge Summary   Patient ID: Chelsey [name] MRN: [id number] DOB/AGE: May 28, 1946 67 y.o.  Admit date: 09/12/2013 Discharge date: 09/14/2013  Primary Diagnosis:  Osteoarthritis Right knee(s)  Admission Diagnoses:  Past Medical History  Diagnosis Date  . Diabetes mellitus   . Allergy   . Osteoarthritis   . Hypertension   . Hyperlipidemia   . GERD (gastroesophageal reflux disease)   . Pneumonia 07/28/2011  . Sleep apnea     setting of 10  . Breast cancer   . Colitis   . Sinusitis   . Aphthous ulcer of mouth 2010    related to chemotherapy   . Carpal tunnel syndrome of right wrist   . Squamous cell carcinoma     left leg  . Colon polyp   . Diverticulosis   . Lymphedema 08-31-13    left arm   Discharge Diagnoses:   Principal Problem:   OA (osteoarthritis) of knee Active Problems:   Postoperative anemia due to acute blood loss  Estimated body mass index is 34.52 kg/(m^2) as calculated from the following:   Height as of this encounter: 5' 3.5" (1.613 m).   Weight as of this encounter: 89.812 kg (198 lb).  Procedure:  Procedure(s) (LRB): RIGHT TOTAL KNEE ARTHROPLASTY (Right)   Consults: None  HPI: Chelsey [name] is a 67 y.o. year old female with end stage OA of her right knee with progressively worsening pain and dysfunction. She has constant pain, with activity and at rest and significant functional deficits with difficulties even with ADLs. She has had extensive non-op management including analgesics, injections of cortisone and viscosupplements, and home exercise program, but remains in significant pain with significant dysfunction.Radiographs show bone on bone arthritis medial and patellofemroal. She presents now for right Total Knee Arthroplasty.   Laboratory Data: Admission on 09/12/2013, Discharged on 09/14/2013  Component Date Value Range Status  . ABO/RH(D) 09/12/2013 O POS   Final  . Antibody Screen 09/12/2013 NEG   Final  . Sample Expiration 09/12/2013  09/15/2013   Final  . Glucose-Capillary 09/12/2013 160* 70 - 99 mg/dL Final  . Comment 1 40/98/1191 Documented in Chart   Final  . Glucose-Capillary 09/12/2013 139* 70 - 99 mg/dL Final  . Glucose-Capillary 09/12/2013 183* 70 - 99 mg/dL Final  . Glucose-Capillary 09/12/2013 226* 70 - 99 mg/dL Final  . WBC 47/82/9562 7.3  4.0 - 10.5 K/uL Final  . RBC 09/13/2013 3.17* 3.87 - 5.11 MIL/uL Final  . Hemoglobin 09/13/2013 9.7* 12.0 - 15.0 g/dL Final  . HCT 13/05/6577 27.8* 36.0 - 46.0 % Final  . MCV 09/13/2013 87.7  78.0 - 100.0 fL Final  . MCH 09/13/2013 30.6  26.0 - 34.0 pg Final  . MCHC 09/13/2013 34.9  30.0 - 36.0 g/dL Final  . RDW 46/96/2952 13.2  11.5 - 15.5 % Final  . Platelets 09/13/2013 160  150 - 400 K/uL Final  . Sodium 09/13/2013 131* 135 - 145 mEq/L Final  . Potassium 09/13/2013 3.8  3.5 - 5.1 mEq/L Final  . Chloride 09/13/2013 98  96 - 112 mEq/L Final  . CO2 09/13/2013 26  19 - 32 mEq/L Final  . Glucose, Bld 09/13/2013 155* 70 - 99 mg/dL Final  . BUN 84/13/2440 8  6 - 23 mg/dL Final  . Creatinine, Ser 09/13/2013 0.58  0.50 - 1.10 mg/dL Final  . Calcium 08/09/2535 8.5  8.4 - 10.5 mg/dL Final  . GFR calc non Af Amer 09/13/2013 >90  >90 mL/min Final  .  GFR calc Af Amer 09/13/2013 >90  >90 mL/min Final   Comment: (NOTE)                          The eGFR has been calculated using the CKD EPI equation.                          This calculation has not been validated in all clinical situations.                          eGFR's persistently <90 mL/min signify possible Chronic Kidney                          Disease.  . Glucose-Capillary 09/12/2013 134* 70 - 99 mg/dL Final  . Glucose-Capillary 09/13/2013 156* 70 - 99 mg/dL Final  . Glucose-Capillary 09/13/2013 192* 70 - 99 mg/dL Final  . Glucose-Capillary 09/13/2013 148* 70 - 99 mg/dL Final  . WBC 65/78/4696 9.0  4.0 - 10.5 K/uL Final  . RBC 09/14/2013 3.15* 3.87 - 5.11 MIL/uL Final  . Hemoglobin 09/14/2013 9.6* 12.0 - 15.0 g/dL Final   . HCT 29/52/8413 27.3* 36.0 - 46.0 % Final  . MCV 09/14/2013 86.7  78.0 - 100.0 fL Final  . MCH 09/14/2013 30.5  26.0 - 34.0 pg Final  . MCHC 09/14/2013 35.2  30.0 - 36.0 g/dL Final  . RDW 24/40/1027 13.3  11.5 - 15.5 % Final  . Platelets 09/14/2013 154  150 - 400 K/uL Final  . Sodium 09/14/2013 129* 135 - 145 mEq/L Final  . Potassium 09/14/2013 3.3* 3.5 - 5.1 mEq/L Final  . Chloride 09/14/2013 94* 96 - 112 mEq/L Final  . CO2 09/14/2013 24  19 - 32 mEq/L Final  . Glucose, Bld 09/14/2013 186* 70 - 99 mg/dL Final  . BUN 25/36/6440 6  6 - 23 mg/dL Final  . Creatinine, Ser 09/14/2013 0.52  0.50 - 1.10 mg/dL Final  . Calcium 34/74/2595 8.9  8.4 - 10.5 mg/dL Final  . GFR calc non Af Amer 09/14/2013 >90  >90 mL/min Final  . GFR calc Af Amer 09/14/2013 >90  >90 mL/min Final   Comment: (NOTE)                          The eGFR has been calculated using the CKD EPI equation.                          This calculation has not been validated in all clinical situations.                          eGFR's persistently <90 mL/min signify possible Chronic Kidney                          Disease.  . Glucose-Capillary 09/13/2013 170* 70 - 99 mg/dL Final  . Comment 1 63/87/5643 Notify RN   Final  . Glucose-Capillary 09/14/2013 180* 70 - 99 mg/dL Final  . Comment 1 32/95/1884 Notify RN   Final  . Glucose-Capillary 09/14/2013 156* 70 - 99 mg/dL Final  . Comment 1 16/60/6301 Notify RN   Final  Hospital Outpatient Visit on 08/31/2013  Component Date Value Range Status  . WBC 08/31/2013  5.1  4.0 - 10.5 K/uL Final  . RBC 08/31/2013 4.19  3.87 - 5.11 MIL/uL Final  . Hemoglobin 08/31/2013 12.6  12.0 - 15.0 g/dL Final  . HCT 78/29/5621 37.3  36.0 - 46.0 % Final  . MCV 08/31/2013 89.0  78.0 - 100.0 fL Final  . MCH 08/31/2013 30.1  26.0 - 34.0 pg Final  . MCHC 08/31/2013 33.8  30.0 - 36.0 g/dL Final  . RDW 30/86/5784 13.4  11.5 - 15.5 % Final  . Platelets 08/31/2013 209  150 - 400 K/uL Final  . Sodium  08/31/2013 137  135 - 145 mEq/L Final  . Potassium 08/31/2013 3.9  3.5 - 5.1 mEq/L Final  . Chloride 08/31/2013 101  96 - 112 mEq/L Final  . CO2 08/31/2013 26  19 - 32 mEq/L Final  . Glucose, Bld 08/31/2013 152* 70 - 99 mg/dL Final  . BUN 69/62/9528 12  6 - 23 mg/dL Final  . Creatinine, Ser 08/31/2013 0.66  0.50 - 1.10 mg/dL Final  . Calcium 41/32/4401 10.2  8.4 - 10.5 mg/dL Final  . Total Protein 08/31/2013 7.2  6.0 - 8.3 g/dL Final  . Albumin 02/72/5366 4.1  3.5 - 5.2 g/dL Final  . AST 44/12/4740 31  0 - 37 U/L Final  . ALT 08/31/2013 30  0 - 35 U/L Final  . Alkaline Phosphatase 08/31/2013 91  39 - 117 U/L Final  . Total Bilirubin 08/31/2013 0.6  0.3 - 1.2 mg/dL Final  . GFR calc non Af Amer 08/31/2013 >90  >90 mL/min Final  . GFR calc Af Amer 08/31/2013 >90  >90 mL/min Final   Comment: (NOTE)                          The eGFR has been calculated using the CKD EPI equation.                          This calculation has not been validated in all clinical situations.                          eGFR's persistently <90 mL/min signify possible Chronic Kidney                          Disease.  Marland Kitchen MRSA, PCR 08/31/2013 NEGATIVE  NEGATIVE Final  . Staphylococcus aureus 08/31/2013 POSITIVE* NEGATIVE Final   Comment:                                 The Xpert SA Assay (FDA                          approved for NASAL specimens                          in patients over 94 years of age),                          is one component of                          a comprehensive surveillance  program.  Test performance has                          been validated by [facility] for patients greater                          than or equal to 70 year old.                          It is not intended                          to diagnose infection nor to                          guide or monitor treatment.  . Prothrombin Time 08/31/2013 12.3  11.6 - 15.2 seconds  Final  . INR 08/31/2013 0.93  0.00 - 1.49 Final  . Color, Urine 08/31/2013 AMBER* YELLOW Final   BIOCHEMICALS MAY BE AFFECTED BY COLOR  . APPearance 08/31/2013 CLOUDY* CLEAR Final  . Specific Gravity, Urine 08/31/2013 1.034* 1.005 - 1.030 Final  . pH 08/31/2013 5.5  5.0 - 8.0 Final  . Glucose, UA 08/31/2013 NEGATIVE  NEGATIVE mg/dL Final  . Hgb urine dipstick 08/31/2013 NEGATIVE  NEGATIVE Final  . Bilirubin Urine 08/31/2013 SMALL* NEGATIVE Final  . Ketones, ur 08/31/2013 NEGATIVE  NEGATIVE mg/dL Final  . Protein, ur 16/07/9603 NEGATIVE  NEGATIVE mg/dL Final  . Urobilinogen, UA 08/31/2013 0.2  0.0 - 1.0 mg/dL Final  . Nitrite 54/06/8118 NEGATIVE  NEGATIVE Final  . Leukocytes, UA 08/31/2013 NEGATIVE  NEGATIVE Final   MICROSCOPIC NOT DONE ON URINES WITH NEGATIVE PROTEIN, BLOOD, LEUKOCYTES, NITRITE, OR GLUCOSE <1000 mg/dL.  Marland Kitchen aPTT 08/31/2013 27  24 - 37 seconds Final  Procedure visit on 08/17/2013  Component Date Value Range Status  . Glucose-Capillary 08/17/2013 110* 70 - 99 mg/dL Final  . Glucose-Capillary 08/17/2013 108* 70 - 99 mg/dL Final     X-Rays:Mm Digital Screening Unilat R  08/18/2013   CLINICAL DATA:  Screening.  EXAM: DIGITAL SCREENING UNILATERAL RIGHT MAMMOGRAM WITH CAD  DIGITAL BREAST TOMOSYNTHESIS  Digital breast tomosynthesis images are acquired in two projections. These images are reviewed in combination with the digital mammogram, confirming the findings below.  COMPARISON:  Previous exam(s).  ACR Breast Density Category b: There are scattered areas of fibroglandular density.  FINDINGS: There are no findings suspicious for malignancy. Images were processed with CAD.  IMPRESSION: No mammographic evidence of malignancy. A result letter of this screening mammogram will be mailed directly to the patient.  RECOMMENDATION: Screening mammogram in one year. (Code:SM-B-01Y)  BI-RADS CATEGORY  1: Negative   Electronically Signed   By: Annia Belt M.D.   On: 08/18/2013 18:40     EKG: Orders placed during the hospital encounter of 12/20/12  . EKG     Hospital Course: ELENI FRANK is a 67 y.o. who was admitted to [facility]. They were brought to the operating room on 09/12/2013 and underwent Procedure(s): RIGHT TOTAL KNEE ARTHROPLASTY.  Patient tolerated the procedure well and was later transferred to the recovery room and then to the orthopaedic floor for postoperative care.  They  were given PO and IV analgesics for pain control following their surgery.  They were given 24 hours of postoperative antibiotics of  Anti-infectives   Start     Dose/Rate Route Frequency Ordered Stop   09/12/13 2000  vancomycin (VANCOCIN) IVPB 1000 mg/200 mL premix     1,000 mg 200 mL/hr over 60 Minutes Intravenous Every 12 hours 09/12/13 1253 09/12/13 2037   09/12/13 0632  vancomycin (VANCOCIN) IVPB 1000 mg/200 mL premix     1,000 mg 200 mL/hr over 60 Minutes Intravenous On call to O.R. 09/12/13 0454 09/12/13 0815     and started on DVT prophylaxis in the form of Xarelto.   PT and OT were ordered for total joint protocol.  Discharge planning consulted to help with postop disposition and equipment needs.  Patient had a decent night on the evening of surgery.  They started to get up OOB with therapy on day one. Hemovac drain was pulled without difficulty.  Continued to work with therapy into day two.  Dressing was changed on day two and the incision was healing well. Patient was seen in rounds and was ready to go home on day two.   Discharge Medications: Prior to Admission medications   Medication Sig Start Date End Date Taking? Authorizing Provider  ALPRAZolam Prudy Feeler) 0.25 MG tablet take 1 tablet by mouth at bedtime if needed for sleep or anxiety 08/31/13  Yes Madelin Headings, MD  anastrozole (ARIMIDEX) 1 MG tablet Take 1 tablet (1 mg total) by mouth daily. 04/06/13  Yes Keitha Butte, NP  cycloSPORINE (RESTASIS) 0.05 % ophthalmic emulsion Place 1 drop into both eyes daily.    Yes Historical Provider, MD  esomeprazole (NEXIUM) 40 MG capsule Take 40 mg by mouth daily at 12 noon. Takes bedtime   Yes Historical Provider, MD  losartan (COZAAR) 50 MG tablet Take 50 mg by mouth at bedtime.   Yes Historical Provider, MD  metFORMIN (GLUMETZA) 500 MG (MOD) 24 hr tablet Take 3 tablets (1,500 mg total) by mouth daily. 2 tablets with breakfast (1000 mg)  and 1 tablet with dinner (500 mg) = 1500 per day 07/25/13  Yes Madelin Headings, MD  rosuvastatin (CRESTOR) 10 MG tablet Take 10 mg by mouth every evening.   Yes Historical Provider, MD  sertraline (ZOLOFT) 50 MG tablet Take 50 mg by mouth every evening.   Yes Historical Provider, MD  zolpidem (AMBIEN) 10 MG tablet Take 10 mg by mouth at bedtime as needed for sleep.   Yes Historical Provider, MD  methocarbamol (ROBAXIN) 500 MG tablet Take 1 tablet (500 mg total) by mouth every 6 (six) hours as needed for muscle spasms. 09/14/13   Verania Salberg, PA-C  oxyCODONE (OXY IR/ROXICODONE) 5 MG immediate release tablet Take 1-2 tablets (5-10 mg total) by mouth every 3 (three) hours as needed for breakthrough pain. 09/14/13   Krisalyn Yankowski Julien Girt, PA-C  rivaroxaban (XARELTO) 10 MG TABS tablet Take 1 tablet (10 mg total) by mouth daily with breakfast. Take Xarelto for two and a half more weeks, then discontinue Xarelto. Once the patient has completed the blood thinner regimen, then take a Baby 81 mg Aspirin daily for four more weeks. 09/14/13   Zelda Reames, PA-C  traMADol (ULTRAM) 50 MG tablet Take 1-2 tablets (50-100 mg total) by mouth every 6 (six) hours as needed (mild pain). 09/14/13   Nanci Lakatos Julien Girt, PA-C   Discharge home with home health  Diet - Cardiac diet and Diabetic diet  Follow up -  in 2 weeks  Activity - WBAT  Disposition - Home  Condition Upon Discharge - Good  D/C Meds - See DC Summary  DVT Prophylaxis - Xarelto      Discharge Orders   Future Appointments Provider Department Dept Phone   10/11/2013 8:30 AM  [facility] [place] [phone]   10/14/2013 10:15 AM Waymon Budge, MD [facility] [phone]   10/18/2013 10:30 AM Madelin Headings, MD [facility] [place] [phone]   03/23/2014 11:00 AM Mauri [place] [facility] MEDICAL ONCOLOGY [phone]   03/30/2014 10:00 AM Lowella Dell, MD [facility] MEDICAL ONCOLOGY [phone]   Future Orders Complete By Expires   Call MD / Call 911  As directed    Comments:     If you experience chest pain or shortness of breath, CALL 911 and be transported to the hospital emergency room.  If you develope a fever above 101 F, pus (white drainage) or increased drainage or redness at the wound, or calf pain, call your surgeon's office.   Change dressing  As directed    Comments:     Change dressing daily with sterile 4 x 4 inch gauze dressing and apply TED hose. Do not submerge the incision under water.   Constipation Prevention  As directed    Comments:     Drink plenty of fluids.  Prune juice may be helpful.  You may use a stool softener, such as Colace (over the counter) 100 mg twice a day.  Use MiraLax (over the counter) for constipation as needed.   Diet - low sodium heart healthy  As directed    Diet Carb Modified  As directed    Discharge instructions  As directed    Comments:     Pick up stool softner and laxative for home. Do not submerge incision under water. May shower. Continue to use ice for pain and swelling from surgery.  Take Xarelto for two and a half more weeks, then discontinue Xarelto. Once the patient has completed the blood thinner regimen, then take a Baby 81 mg Aspirin daily for four more weeks.   Do not put a pillow under the knee. Place it under the heel.  As directed    Do not sit on low chairs, stoools or toilet seats, as it may be difficult to get up from low surfaces  As directed    Driving restrictions  As directed    Comments:     No driving  until released by the physician.   Increase activity slowly as tolerated  As directed    Lifting restrictions  As directed    Comments:     No lifting until released by the physician.   Patient may shower  As directed    Comments:     You may shower without a dressing once there is no drainage.  Do not wash over the wound.  If drainage remains, do not shower until drainage stops.   TED hose  As directed    Comments:     Use stockings (TED hose) for 3 weeks on both leg(s).  You may remove them at night for sleeping.   Weight bearing as tolerated  As directed    Questions:     Laterality:     Extremity:         Medication List    STOP taking these medications       b complex  vitamins capsule     CALTRATE 600+D PO     diclofenac 75 MG EC tablet  Commonly known as:  VOLTAREN     Vitamin D-3 1000 UNITS Caps      TAKE these medications       ALPRAZolam 0.25 MG tablet  Commonly known as:  XANAX  take 1 tablet by mouth at bedtime if needed for sleep or anxiety     anastrozole 1 MG tablet  Commonly known as:  ARIMIDEX  Take 1 tablet (1 mg total) by mouth daily.     cycloSPORINE 0.05 % ophthalmic emulsion  Commonly known as:  RESTASIS  Place 1 drop into both eyes daily.     esomeprazole 40 MG capsule  Commonly known as:  NEXIUM  Take 40 mg by mouth daily at 12 noon. Takes bedtime     losartan 50 MG tablet  Commonly known as:  COZAAR  Take 50 mg by mouth at bedtime.     metFORMIN 500 MG (MOD) 24 hr tablet  Commonly known as:  GLUMETZA  Take 3 tablets (1,500 mg total) by mouth daily. 2 tablets with breakfast (1000 mg)  and 1 tablet with dinner (500 mg) = 1500 per day     methocarbamol 500 MG tablet  Commonly known as:  ROBAXIN  Take 1 tablet (500 mg total) by mouth every 6 (six) hours as needed for muscle spasms.     oxyCODONE 5 MG immediate release tablet  Commonly known as:  Oxy IR/ROXICODONE  Take 1-2 tablets (5-10 mg total) by mouth every 3 (three) hours as  needed for breakthrough pain.     rivaroxaban 10 MG Tabs tablet  Commonly known as:  XARELTO  - Take 1 tablet (10 mg total) by mouth daily with breakfast. Take Xarelto for two and a half more weeks, then discontinue Xarelto.  - Once the patient has completed the blood thinner regimen, then take a Baby 81 mg Aspirin daily for four more weeks.     rosuvastatin 10 MG tablet  Commonly known as:  CRESTOR  Take 10 mg by mouth every evening.     sertraline 50 MG tablet  Commonly known as:  ZOLOFT  Take 50 mg by mouth every evening.     traMADol 50 MG tablet  Commonly known as:  ULTRAM  Take 1-2 tablets (50-100 mg total) by mouth every 6 (six) hours as needed (mild pain).     zolpidem 10 MG tablet  Commonly known as:  AMBIEN  Take 10 mg by mouth at bedtime as needed for sleep.       Follow-up Information   Follow up with Loanne Drilling, MD. Schedule an appointment as soon as possible for a visit in 2 weeks.   Specialty:  Orthopedic Surgery   Contact information:   [address] [place] [address] [phone]       Signed: Patrica Duel 09/20/2013, 9:19 AM

## 2013-09-15 DIAGNOSIS — I10 Essential (primary) hypertension: Secondary | ICD-10-CM | POA: Diagnosis not present

## 2013-09-15 DIAGNOSIS — Z471 Aftercare following joint replacement surgery: Secondary | ICD-10-CM | POA: Diagnosis not present

## 2013-09-15 DIAGNOSIS — IMO0001 Reserved for inherently not codable concepts without codable children: Secondary | ICD-10-CM | POA: Diagnosis not present

## 2013-09-15 DIAGNOSIS — Z96659 Presence of unspecified artificial knee joint: Secondary | ICD-10-CM | POA: Diagnosis not present

## 2013-09-15 DIAGNOSIS — E119 Type 2 diabetes mellitus without complications: Secondary | ICD-10-CM | POA: Diagnosis not present

## 2013-09-16 DIAGNOSIS — Z96659 Presence of unspecified artificial knee joint: Secondary | ICD-10-CM | POA: Diagnosis not present

## 2013-09-16 DIAGNOSIS — Z471 Aftercare following joint replacement surgery: Secondary | ICD-10-CM | POA: Diagnosis not present

## 2013-09-16 DIAGNOSIS — IMO0001 Reserved for inherently not codable concepts without codable children: Secondary | ICD-10-CM | POA: Diagnosis not present

## 2013-09-16 DIAGNOSIS — E119 Type 2 diabetes mellitus without complications: Secondary | ICD-10-CM | POA: Diagnosis not present

## 2013-09-16 DIAGNOSIS — I10 Essential (primary) hypertension: Secondary | ICD-10-CM | POA: Diagnosis not present

## 2013-09-19 DIAGNOSIS — I10 Essential (primary) hypertension: Secondary | ICD-10-CM | POA: Diagnosis not present

## 2013-09-19 DIAGNOSIS — IMO0001 Reserved for inherently not codable concepts without codable children: Secondary | ICD-10-CM | POA: Diagnosis not present

## 2013-09-19 DIAGNOSIS — Z471 Aftercare following joint replacement surgery: Secondary | ICD-10-CM | POA: Diagnosis not present

## 2013-09-19 DIAGNOSIS — Z96659 Presence of unspecified artificial knee joint: Secondary | ICD-10-CM | POA: Diagnosis not present

## 2013-09-19 DIAGNOSIS — E119 Type 2 diabetes mellitus without complications: Secondary | ICD-10-CM | POA: Diagnosis not present

## 2013-09-20 DIAGNOSIS — Z96659 Presence of unspecified artificial knee joint: Secondary | ICD-10-CM | POA: Diagnosis not present

## 2013-09-20 DIAGNOSIS — IMO0001 Reserved for inherently not codable concepts without codable children: Secondary | ICD-10-CM | POA: Diagnosis not present

## 2013-09-20 DIAGNOSIS — E119 Type 2 diabetes mellitus without complications: Secondary | ICD-10-CM | POA: Diagnosis not present

## 2013-09-20 DIAGNOSIS — Z471 Aftercare following joint replacement surgery: Secondary | ICD-10-CM | POA: Diagnosis not present

## 2013-09-20 DIAGNOSIS — I10 Essential (primary) hypertension: Secondary | ICD-10-CM | POA: Diagnosis not present

## 2013-09-21 DIAGNOSIS — Z471 Aftercare following joint replacement surgery: Secondary | ICD-10-CM | POA: Diagnosis not present

## 2013-09-21 DIAGNOSIS — IMO0001 Reserved for inherently not codable concepts without codable children: Secondary | ICD-10-CM | POA: Diagnosis not present

## 2013-09-21 DIAGNOSIS — I10 Essential (primary) hypertension: Secondary | ICD-10-CM | POA: Diagnosis not present

## 2013-09-21 DIAGNOSIS — E119 Type 2 diabetes mellitus without complications: Secondary | ICD-10-CM | POA: Diagnosis not present

## 2013-09-21 DIAGNOSIS — Z96659 Presence of unspecified artificial knee joint: Secondary | ICD-10-CM | POA: Diagnosis not present

## 2013-09-22 ENCOUNTER — Encounter: Payer: Self-pay | Admitting: Internal Medicine

## 2013-09-22 DIAGNOSIS — I10 Essential (primary) hypertension: Secondary | ICD-10-CM | POA: Diagnosis not present

## 2013-09-22 DIAGNOSIS — Z471 Aftercare following joint replacement surgery: Secondary | ICD-10-CM | POA: Diagnosis not present

## 2013-09-22 DIAGNOSIS — IMO0001 Reserved for inherently not codable concepts without codable children: Secondary | ICD-10-CM | POA: Diagnosis not present

## 2013-09-22 DIAGNOSIS — Z96659 Presence of unspecified artificial knee joint: Secondary | ICD-10-CM | POA: Diagnosis not present

## 2013-09-22 DIAGNOSIS — E119 Type 2 diabetes mellitus without complications: Secondary | ICD-10-CM | POA: Diagnosis not present

## 2013-09-23 DIAGNOSIS — IMO0001 Reserved for inherently not codable concepts without codable children: Secondary | ICD-10-CM | POA: Diagnosis not present

## 2013-09-23 DIAGNOSIS — E119 Type 2 diabetes mellitus without complications: Secondary | ICD-10-CM | POA: Diagnosis not present

## 2013-09-23 DIAGNOSIS — Z96659 Presence of unspecified artificial knee joint: Secondary | ICD-10-CM | POA: Diagnosis not present

## 2013-09-23 DIAGNOSIS — Z471 Aftercare following joint replacement surgery: Secondary | ICD-10-CM | POA: Diagnosis not present

## 2013-09-23 DIAGNOSIS — I10 Essential (primary) hypertension: Secondary | ICD-10-CM | POA: Diagnosis not present

## 2013-09-26 DIAGNOSIS — E119 Type 2 diabetes mellitus without complications: Secondary | ICD-10-CM | POA: Diagnosis not present

## 2013-09-26 DIAGNOSIS — Z96659 Presence of unspecified artificial knee joint: Secondary | ICD-10-CM | POA: Diagnosis not present

## 2013-09-26 DIAGNOSIS — Z471 Aftercare following joint replacement surgery: Secondary | ICD-10-CM | POA: Diagnosis not present

## 2013-09-26 DIAGNOSIS — IMO0001 Reserved for inherently not codable concepts without codable children: Secondary | ICD-10-CM | POA: Diagnosis not present

## 2013-09-26 DIAGNOSIS — I10 Essential (primary) hypertension: Secondary | ICD-10-CM | POA: Diagnosis not present

## 2013-09-28 DIAGNOSIS — Z471 Aftercare following joint replacement surgery: Secondary | ICD-10-CM | POA: Diagnosis not present

## 2013-09-28 DIAGNOSIS — I10 Essential (primary) hypertension: Secondary | ICD-10-CM | POA: Diagnosis not present

## 2013-09-28 DIAGNOSIS — IMO0001 Reserved for inherently not codable concepts without codable children: Secondary | ICD-10-CM | POA: Diagnosis not present

## 2013-09-28 DIAGNOSIS — E119 Type 2 diabetes mellitus without complications: Secondary | ICD-10-CM | POA: Diagnosis not present

## 2013-09-28 DIAGNOSIS — Z96659 Presence of unspecified artificial knee joint: Secondary | ICD-10-CM | POA: Diagnosis not present

## 2013-09-29 DIAGNOSIS — IMO0001 Reserved for inherently not codable concepts without codable children: Secondary | ICD-10-CM | POA: Diagnosis not present

## 2013-09-29 DIAGNOSIS — Z471 Aftercare following joint replacement surgery: Secondary | ICD-10-CM | POA: Diagnosis not present

## 2013-09-29 DIAGNOSIS — Z96659 Presence of unspecified artificial knee joint: Secondary | ICD-10-CM | POA: Diagnosis not present

## 2013-09-29 DIAGNOSIS — E119 Type 2 diabetes mellitus without complications: Secondary | ICD-10-CM | POA: Diagnosis not present

## 2013-09-29 DIAGNOSIS — I10 Essential (primary) hypertension: Secondary | ICD-10-CM | POA: Diagnosis not present

## 2013-10-01 DIAGNOSIS — I10 Essential (primary) hypertension: Secondary | ICD-10-CM | POA: Diagnosis not present

## 2013-10-01 DIAGNOSIS — IMO0001 Reserved for inherently not codable concepts without codable children: Secondary | ICD-10-CM | POA: Diagnosis not present

## 2013-10-01 DIAGNOSIS — Z471 Aftercare following joint replacement surgery: Secondary | ICD-10-CM | POA: Diagnosis not present

## 2013-10-01 DIAGNOSIS — E119 Type 2 diabetes mellitus without complications: Secondary | ICD-10-CM | POA: Diagnosis not present

## 2013-10-01 DIAGNOSIS — Z96659 Presence of unspecified artificial knee joint: Secondary | ICD-10-CM | POA: Diagnosis not present

## 2013-10-04 DIAGNOSIS — Z96659 Presence of unspecified artificial knee joint: Secondary | ICD-10-CM | POA: Diagnosis not present

## 2013-10-04 DIAGNOSIS — R262 Difficulty in walking, not elsewhere classified: Secondary | ICD-10-CM | POA: Diagnosis not present

## 2013-10-07 DIAGNOSIS — R262 Difficulty in walking, not elsewhere classified: Secondary | ICD-10-CM | POA: Diagnosis not present

## 2013-10-07 DIAGNOSIS — Z96659 Presence of unspecified artificial knee joint: Secondary | ICD-10-CM | POA: Diagnosis not present

## 2013-10-10 DIAGNOSIS — R262 Difficulty in walking, not elsewhere classified: Secondary | ICD-10-CM | POA: Diagnosis not present

## 2013-10-10 DIAGNOSIS — Z96659 Presence of unspecified artificial knee joint: Secondary | ICD-10-CM | POA: Diagnosis not present

## 2013-10-11 ENCOUNTER — Other Ambulatory Visit: Payer: Medicare Other

## 2013-10-12 DIAGNOSIS — R262 Difficulty in walking, not elsewhere classified: Secondary | ICD-10-CM | POA: Diagnosis not present

## 2013-10-12 DIAGNOSIS — Z96659 Presence of unspecified artificial knee joint: Secondary | ICD-10-CM | POA: Diagnosis not present

## 2013-10-14 ENCOUNTER — Ambulatory Visit: Payer: Medicare Other | Admitting: Internal Medicine

## 2013-10-14 DIAGNOSIS — Z96659 Presence of unspecified artificial knee joint: Secondary | ICD-10-CM | POA: Diagnosis not present

## 2013-10-14 DIAGNOSIS — R262 Difficulty in walking, not elsewhere classified: Secondary | ICD-10-CM | POA: Diagnosis not present

## 2013-10-17 DIAGNOSIS — R262 Difficulty in walking, not elsewhere classified: Secondary | ICD-10-CM | POA: Diagnosis not present

## 2013-10-17 DIAGNOSIS — Z96659 Presence of unspecified artificial knee joint: Secondary | ICD-10-CM | POA: Diagnosis not present

## 2013-10-18 ENCOUNTER — Ambulatory Visit: Payer: Medicare Other | Admitting: Internal Medicine

## 2013-10-18 DIAGNOSIS — M1710 Unilateral primary osteoarthritis, unspecified knee: Secondary | ICD-10-CM | POA: Diagnosis not present

## 2013-10-18 DIAGNOSIS — Z471 Aftercare following joint replacement surgery: Secondary | ICD-10-CM | POA: Diagnosis not present

## 2013-10-18 DIAGNOSIS — Z96659 Presence of unspecified artificial knee joint: Secondary | ICD-10-CM | POA: Diagnosis not present

## 2013-10-19 DIAGNOSIS — M899 Disorder of bone, unspecified: Secondary | ICD-10-CM | POA: Diagnosis not present

## 2013-10-19 DIAGNOSIS — Z91040 Latex allergy status: Secondary | ICD-10-CM | POA: Diagnosis not present

## 2013-10-19 DIAGNOSIS — Z96659 Presence of unspecified artificial knee joint: Secondary | ICD-10-CM | POA: Diagnosis not present

## 2013-10-19 DIAGNOSIS — I10 Essential (primary) hypertension: Secondary | ICD-10-CM | POA: Diagnosis not present

## 2013-10-19 DIAGNOSIS — E785 Hyperlipidemia, unspecified: Secondary | ICD-10-CM | POA: Diagnosis not present

## 2013-10-19 DIAGNOSIS — Z880 Allergy status to penicillin: Secondary | ICD-10-CM | POA: Diagnosis not present

## 2013-10-19 DIAGNOSIS — Z978 Presence of other specified devices: Secondary | ICD-10-CM | POA: Diagnosis not present

## 2013-10-19 DIAGNOSIS — R262 Difficulty in walking, not elsewhere classified: Secondary | ICD-10-CM | POA: Diagnosis not present

## 2013-10-19 DIAGNOSIS — N898 Other specified noninflammatory disorders of vagina: Secondary | ICD-10-CM | POA: Diagnosis not present

## 2013-10-19 DIAGNOSIS — C50919 Malignant neoplasm of unspecified site of unspecified female breast: Secondary | ICD-10-CM | POA: Diagnosis not present

## 2013-10-19 DIAGNOSIS — M949 Disorder of cartilage, unspecified: Secondary | ICD-10-CM | POA: Diagnosis not present

## 2013-10-20 ENCOUNTER — Other Ambulatory Visit: Payer: Self-pay | Admitting: Internal Medicine

## 2013-10-21 DIAGNOSIS — R262 Difficulty in walking, not elsewhere classified: Secondary | ICD-10-CM | POA: Diagnosis not present

## 2013-10-21 DIAGNOSIS — Z96659 Presence of unspecified artificial knee joint: Secondary | ICD-10-CM | POA: Diagnosis not present

## 2013-10-22 ENCOUNTER — Telehealth: Payer: Self-pay | Admitting: Internal Medicine

## 2013-10-22 NOTE — Telephone Encounter (Signed)
[id number] - [place], [place] - [address] requesting transfer script for sertraline (ZOLOFT) 50 MG tablet # 90

## 2013-10-24 ENCOUNTER — Other Ambulatory Visit: Payer: Self-pay | Admitting: Family Medicine

## 2013-10-24 DIAGNOSIS — Z96659 Presence of unspecified artificial knee joint: Secondary | ICD-10-CM | POA: Diagnosis not present

## 2013-10-24 DIAGNOSIS — R262 Difficulty in walking, not elsewhere classified: Secondary | ICD-10-CM | POA: Diagnosis not present

## 2013-10-25 ENCOUNTER — Other Ambulatory Visit: Payer: Self-pay | Admitting: Family Medicine

## 2013-10-25 MED ORDER — SERTRALINE HCL 50 MG PO TABS: 50.0000 mg | ORAL_TABLET | Freq: Every evening | ORAL | Status: DC

## 2013-10-25 NOTE — Telephone Encounter (Signed)
Sent to the pharmacy by e-scribe. 

## 2013-10-26 DIAGNOSIS — R262 Difficulty in walking, not elsewhere classified: Secondary | ICD-10-CM | POA: Diagnosis not present

## 2013-10-26 DIAGNOSIS — Z96659 Presence of unspecified artificial knee joint: Secondary | ICD-10-CM | POA: Diagnosis not present

## 2013-10-28 DIAGNOSIS — R262 Difficulty in walking, not elsewhere classified: Secondary | ICD-10-CM | POA: Diagnosis not present

## 2013-10-28 DIAGNOSIS — Z96659 Presence of unspecified artificial knee joint: Secondary | ICD-10-CM | POA: Diagnosis not present

## 2013-10-31 DIAGNOSIS — R262 Difficulty in walking, not elsewhere classified: Secondary | ICD-10-CM | POA: Diagnosis not present

## 2013-10-31 DIAGNOSIS — Z96659 Presence of unspecified artificial knee joint: Secondary | ICD-10-CM | POA: Diagnosis not present

## 2013-11-02 DIAGNOSIS — R262 Difficulty in walking, not elsewhere classified: Secondary | ICD-10-CM | POA: Diagnosis not present

## 2013-11-02 DIAGNOSIS — Z96659 Presence of unspecified artificial knee joint: Secondary | ICD-10-CM | POA: Diagnosis not present

## 2013-11-04 DIAGNOSIS — R262 Difficulty in walking, not elsewhere classified: Secondary | ICD-10-CM | POA: Diagnosis not present

## 2013-11-04 DIAGNOSIS — Z96659 Presence of unspecified artificial knee joint: Secondary | ICD-10-CM | POA: Diagnosis not present

## 2013-11-07 DIAGNOSIS — R262 Difficulty in walking, not elsewhere classified: Secondary | ICD-10-CM | POA: Diagnosis not present

## 2013-11-07 DIAGNOSIS — Z96659 Presence of unspecified artificial knee joint: Secondary | ICD-10-CM | POA: Diagnosis not present

## 2013-11-09 ENCOUNTER — Other Ambulatory Visit (INDEPENDENT_AMBULATORY_CARE_PROVIDER_SITE_OTHER): Payer: Medicare Other

## 2013-11-09 DIAGNOSIS — R262 Difficulty in walking, not elsewhere classified: Secondary | ICD-10-CM | POA: Diagnosis not present

## 2013-11-09 DIAGNOSIS — Z96659 Presence of unspecified artificial knee joint: Secondary | ICD-10-CM | POA: Diagnosis not present

## 2013-11-09 DIAGNOSIS — E1310 Other specified diabetes mellitus with ketoacidosis without coma: Secondary | ICD-10-CM | POA: Diagnosis not present

## 2013-11-09 DIAGNOSIS — E1110 Type 2 diabetes mellitus with ketoacidosis without coma: Secondary | ICD-10-CM

## 2013-11-09 DIAGNOSIS — Z79899 Other long term (current) drug therapy: Secondary | ICD-10-CM | POA: Diagnosis not present

## 2013-11-09 DIAGNOSIS — E785 Hyperlipidemia, unspecified: Secondary | ICD-10-CM

## 2013-11-09 LAB — LIPID PANEL
Cholesterol: 247 mg/dL — ABNORMAL HIGH (ref 0–200)
HDL: 56.5 mg/dL (ref >39.00)
Total CHOL/HDL Ratio: 4
Triglycerides: 119 mg/dL (ref 0.0–149.0)
VLDL: 23.8 mg/dL (ref 0.0–40.0)

## 2013-11-09 LAB — LDL CHOLESTEROL, DIRECT: LDL DIRECT: 173.9 mg/dL

## 2013-11-09 LAB — HEMOGLOBIN A1C: HEMOGLOBIN A1C: 6.2 % (ref 4.6–6.5)

## 2013-11-17 ENCOUNTER — Ambulatory Visit: Payer: Medicare Other | Admitting: Internal Medicine

## 2013-11-17 DIAGNOSIS — Z96659 Presence of unspecified artificial knee joint: Secondary | ICD-10-CM | POA: Diagnosis not present

## 2013-11-17 DIAGNOSIS — R262 Difficulty in walking, not elsewhere classified: Secondary | ICD-10-CM | POA: Diagnosis not present

## 2013-11-18 DIAGNOSIS — R262 Difficulty in walking, not elsewhere classified: Secondary | ICD-10-CM | POA: Diagnosis not present

## 2013-11-18 DIAGNOSIS — Z96659 Presence of unspecified artificial knee joint: Secondary | ICD-10-CM | POA: Diagnosis not present

## 2013-11-21 ENCOUNTER — Ambulatory Visit (INDEPENDENT_AMBULATORY_CARE_PROVIDER_SITE_OTHER): Payer: Medicare Other | Admitting: Internal Medicine

## 2013-11-21 ENCOUNTER — Encounter: Payer: Self-pay | Admitting: Internal Medicine

## 2013-11-21 VITALS — BP 124/70 | HR 76 | Ht 64.0 in | Wt 186.0 lb

## 2013-11-21 DIAGNOSIS — J310 Chronic rhinitis: Secondary | ICD-10-CM

## 2013-11-21 DIAGNOSIS — G4733 Obstructive sleep apnea (adult) (pediatric): Secondary | ICD-10-CM | POA: Diagnosis not present

## 2013-11-21 NOTE — Patient Instructions (Signed)
Suggest you take your CPAP machine and mask to [facility] and ask them especially to see if they can help you get a better mask fit.  Order- [facility]- CPAP download for pressure recommendation   Dx OSA

## 2013-11-21 NOTE — Progress Notes (Signed)
10/14/12-- 24 yoF never smoker here with husband. Hx OSA/ CPAP, complicated by history breast cancer, hypertension, allergic rhinitis           Here with husband follows for : last seen in 2010. pt wears mask 7 days week approx 8 hours . denies any mask or pressure issues.  NPSG 47.7/ hr Wearing CPAP 11/[facility] all night every night and says she can't sleep without it. She only started wearing CPAP last year after recovering from breast cancer surgery and reconstruction.  Now pending TKR  11/21/13- 66 yoF never smoker here with husband. Hx OSA/ CPAP, complicated by history breast cancer, hypertension, allergic rhinitis           Here with husband Wears cpap 11/ [facility] nightly, 7.5-8 hours nighlty..  Has problems wearing mask, adjusting constantly through the night.  states it slides and doesn't fit well.  Nose gets stuffy.   ROS-see HPI Constitutional:   No-   weight loss, night sweats, fevers, chills, fatigue, lassitude. HEENT:   No-  headaches, difficulty swallowing, tooth/dental problems, sore throat,       No-  sneezing, itching, ear ache, +nasal congestion, post nasal drip,  CV:  No-   chest pain, orthopnea, PND, swelling in lower extremities, anasarca,                                  dizziness, palpitations Resp: No-   shortness of breath with exertion or at rest.              No-   productive cough,  No non-productive cough,  No- coughing up of blood.              No-   change in color of mucus.  No- wheezing.   Skin: No-   rash or lesions. GI:  No-   heartburn, indigestion, abdominal pain, nausea, vomiting,  GU:  MS:  + joint pain or swelling.   Neuro-     nothing unusual Psych:  No- change in mood or affect. No depression or anxiety.  No memory loss.  OBJ- Physical Exam General- Alert, Oriented, Affect-appropriate, Distress- none acute Skin- rash-none, lesions- none, excoriation- none Lymphadenopathy- none Head- atraumatic            Eyes- Gross vision intact, PERRLA,  conjunctivae and secretions clear            Ears- Hearing, canals-normal            Nose- + prominent turbinates, Septal dev +mild, mucus, polyps, erosion, perforation             Throat- Mallampati III , mucosa clear , drainage- none, tonsils- atrophic, own teeth Neck- flexible , trachea midline, no stridor , thyroid nl, carotid no bruit Chest - symmetrical excursion , unlabored           Heart/CV- RRR , no murmur , no gallop  , no rub, nl s1 s2                           - JVD- none , edema- none, stasis changes- none, varices- none           Lung- clear to P&A, wheeze- none, cough- none , dullness-none, rub- none           Chest wall-  Abd-  Br/ Gen/ Rectal- Not done, not indicated Extrem-  cyanosis- none, clubbing, none, atrophy- none, strength- nl Neuro- grossly intact to observation

## 2013-11-24 ENCOUNTER — Ambulatory Visit (INDEPENDENT_AMBULATORY_CARE_PROVIDER_SITE_OTHER): Payer: Medicare Other | Admitting: Internal Medicine

## 2013-11-24 ENCOUNTER — Encounter: Payer: Self-pay | Admitting: Internal Medicine

## 2013-11-24 VITALS — BP 122/70 | HR 72 | Temp 98.1°F | Ht 63.75 in | Wt 184.0 lb

## 2013-11-24 DIAGNOSIS — Z23 Encounter for immunization: Secondary | ICD-10-CM

## 2013-11-24 DIAGNOSIS — D649 Anemia, unspecified: Secondary | ICD-10-CM | POA: Insufficient documentation

## 2013-11-24 DIAGNOSIS — Z96659 Presence of unspecified artificial knee joint: Secondary | ICD-10-CM

## 2013-11-24 DIAGNOSIS — E119 Type 2 diabetes mellitus without complications: Secondary | ICD-10-CM | POA: Diagnosis not present

## 2013-11-24 DIAGNOSIS — C50919 Malignant neoplasm of unspecified site of unspecified female breast: Secondary | ICD-10-CM

## 2013-11-24 DIAGNOSIS — I10 Essential (primary) hypertension: Secondary | ICD-10-CM

## 2013-11-24 DIAGNOSIS — E785 Hyperlipidemia, unspecified: Secondary | ICD-10-CM

## 2013-11-24 LAB — POCT HEMOGLOBIN: Hemoglobin: 15 g/dL (ref 12.2–16.2)

## 2013-11-24 NOTE — Assessment & Plan Note (Addendum)
Temp stopped crestor post op cause of concern about muscl aches ldl now elevated can restart when improved [id number]

## 2013-11-24 NOTE — Assessment & Plan Note (Signed)
Improved   No know se of med at this time

## 2013-11-24 NOTE — Progress Notes (Signed)
Chief Complaint  Patient presents with  . Follow-up  . Diabetes    HPI: Patient comes in today for follow up of  multiple medical problems.     Had tkr 12 /14  Still a problem  rehabing  Pain and fatigue still problematic trying to work  Pain med at night and as needed getting pt  DM: seems to be ok sugars no nes s x  SE:GBTD  LIPIDS: stopped meds temp because muscles feel weak and achy   CPAP: on cpap ok  Given boniva but hasn't yet started   No bleeding taking b12 no iron  Transferring care to [facility]  ROS: See pertinent positives and negatives per HPI. Cold all the time no syncope getting nodules again on hands like moms arthritis ? Should she see rheum  these sx when away under chemo.  Past Medical History  Diagnosis Date  . Diabetes mellitus   . Allergy   . Osteoarthritis   . Hypertension   . Hyperlipidemia   . GERD (gastroesophageal reflux disease)   . Pneumonia 07/28/2011  . Sleep apnea     setting of 10  . Breast cancer   . Colitis   . Sinusitis   . Aphthous ulcer of mouth 2010    related to chemotherapy   . Carpal tunnel syndrome of right wrist   . Squamous cell carcinoma     left leg  . Colon polyp   . Diverticulosis   . Lymphedema 08-31-13    left arm    Family History  Problem Relation Age of Onset  . Alzheimer's disease Mother   . Sleep apnea Mother   . Diabetes Mother   . Hypertension Mother   . Hyperlipidemia Mother   . Breast cancer Mother 54    triple negative  . Anxiety disorder Daughter   . Hypertension Son     pulmonary  . Congenital heart disease Son 0    TGA  died age 26     History   Social History  . Marital Status: Married    Spouse Name: N/A    Number of Children: 3  . 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 Smoking status: Never Smoker   . Smokeless tobacco: Never Used  . Alcohol Use: 3.6 oz/week    6 Glasses of wine per week     Comment:  1-2 glasses every other night  . Drug Use: No  . Sexual Activity: Yes    Birth Control/ Protection: Post-menopausal, Surgical   Other Topics Concern  . 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    Outpatient Encounter Prescriptions as of 11/24/2013  Medication Sig  . anastrozole (ARIMIDEX) 1 MG tablet Take 1 tablet (1 mg total) by mouth daily.  . cycloSPORINE (RESTASIS) 0.05 % ophthalmic emulsion Place 1 drop into both eyes daily.  Marland Kitchen esomeprazole (NEXIUM) 40 MG capsule Take 40 mg by mouth daily at 12 noon. Takes bedtime  . losartan (COZAAR) 50 MG tablet Take 50 mg by mouth at bedtime.  . metFORMIN (GLUMETZA) 500 MG (MOD) 24 hr tablet 1,500 mg daily. 2 tablets with breakfast (1000 mg)  and 1 tablet with dinner (500 mg) = 1500 per  day  . methocarbamol (ROBAXIN) 500 MG tablet Take 1 tablet (500 mg total) by mouth every 6 (six) hours as needed for muscle spasms.  Marland Kitchen oxyCODONE (OXY IR/ROXICODONE) 5 MG immediate release tablet   . sertraline (ZOLOFT) 50 MG tablet Take 1 tablet (50 mg total) by mouth every evening.  . traMADol (ULTRAM) 50 MG tablet Take 1-2 tablets (50-100 mg total) by mouth every 6 (six) hours as needed (mild pain).  Marland Kitchen zolpidem (AMBIEN) 10 MG tablet Take 10 mg by mouth at bedtime as needed for sleep.  . [DISCONTINUED] metFORMIN (GLUMETZA) 500 MG (MOD) 24 hr tablet Take 3 tablets (1,500 mg total) by mouth daily. 2 tablets with breakfast (1000 mg)  and 1 tablet with dinner (500 mg) = 1500 per day  . ALPRAZolam (XANAX) 0.25 MG tablet take 1 tablet by mouth at bedtime if needed for sleep or anxiety  . rosuvastatin (CRESTOR) 10 MG tablet Take 10 mg by mouth every evening.    EXAM:  BP 122/70  Pulse 72  Temp(Src) 98.1 F (36.7 C) (Oral)  Ht 5' 3.75" (1.619 m)  Wt 184 lb (83.462 kg)  BMI 31.84 kg/m2  SpO2 99%  Body mass index is 31.84  kg/(m^2).  GENERAL: vitals reviewed and listed above, alert, oriented, appears well hydrated and in no acute distress tired non toxic and well [place] antalgic but independent  HEENT: atraumatic, conjunctiva  clear, no obvious abnormalities on inspection of external nose and ears mild congestion NECK: no obvious masses on inspection palpation  LUNGS: clear to auscultation bilaterally, no wheezes, rales or rhonchi, good air movement CV: HRRR, no clubbing cyanosis or  peripheral edema nl cap refill  Short sem lusb no radiation MS: moves all extremities  PSYCH: pleasant and cooperative, no obvious depression or anxiety Lab Results  Component Value Date   HGBA1C 6.2 11/09/2013     ASSESSMENT AND PLAN:  Discussed the following assessment and plan:  Diabetes mellitus, type 2  HYPERTENSION  HYPERLIPIDEMIA  Anemia - post op improved  - Plan: POCT hemoglobin  Need for vaccination with 13-polyvalent pneumococcal conjugate vaccine - Plan: Pneumococcal conjugate vaccine 13-valent  S/P TKR (total knee replacement) - right 12 14  MALIGNANT NEOPLASM OF BREAST UNSPECIFIED SITE  -Patient advised to return or notify health care team  if symptoms worsen or persist or new concerns arise.  Patient Instructions  Blood sugar  Is better. Check anemia today.  Better  Cholesterol ius back up  But ok to stay off ,med for now.  And  restart when feeling better.  Fatigue could be from pain medication surgery and anemia.   prevnar 13 today . Get pneumovax 23 in a year.   ROV in 6 months preventive visit and labs     Standley Brooking. Nkenge Sonntag M.D.  Pre visit review using our clinic review tool, if applicable. No additional management support is needed unless otherwise documented below in the visit note.

## 2013-11-24 NOTE — Patient Instructions (Addendum)
Blood sugar  Is better. Check anemia today.  Better  Cholesterol ius back up  But ok to stay off ,med for now.  And  restart when feeling better.  Fatigue could be from pain medication surgery and anemia.   prevnar 13 today . Get pneumovax 23 in a year.   ROV in 6 months preventive visit and labs

## 2013-11-24 NOTE — Assessment & Plan Note (Signed)
transferring care to [facility] .

## 2013-11-25 ENCOUNTER — Telehealth: Payer: Self-pay

## 2013-11-25 ENCOUNTER — Telehealth: Payer: Self-pay | Admitting: Internal Medicine

## 2013-11-25 NOTE — Telephone Encounter (Signed)
Relevant patient education assigned to patient using Emmi. ° °

## 2013-11-29 DIAGNOSIS — Z96659 Presence of unspecified artificial knee joint: Secondary | ICD-10-CM | POA: Diagnosis not present

## 2013-11-29 DIAGNOSIS — R262 Difficulty in walking, not elsewhere classified: Secondary | ICD-10-CM | POA: Diagnosis not present

## 2013-12-01 ENCOUNTER — Encounter: Payer: Self-pay | Admitting: Internal Medicine

## 2013-12-13 ENCOUNTER — Encounter: Payer: Self-pay | Admitting: Internal Medicine

## 2013-12-13 NOTE — Assessment & Plan Note (Signed)
We need to work on mask fit. Currently using CPAP 11 Plan auto titration download to see if we can reduce pressure. That would help with mask leak.

## 2013-12-13 NOTE — Assessment & Plan Note (Signed)
Persistent nasal stuffiness looks more like mucosal fullness rather than just cartilage obstruction.

## 2014-01-09 ENCOUNTER — Telehealth: Payer: Self-pay | Admitting: *Deleted

## 2014-01-09 NOTE — Telephone Encounter (Signed)
Per POF 3/29 I have moved apapts and mailed calendar. I have also left her a message

## 2014-01-13 ENCOUNTER — Other Ambulatory Visit: Payer: Self-pay | Admitting: Internal Medicine

## 2014-01-17 ENCOUNTER — Other Ambulatory Visit: Payer: Self-pay | Admitting: Internal Medicine

## 2014-02-03 ENCOUNTER — Other Ambulatory Visit: Payer: Self-pay | Admitting: Internal Medicine

## 2014-02-06 NOTE — Telephone Encounter (Signed)
Sent to the pharmacy by e-scribe. 

## 2014-02-18 ENCOUNTER — Other Ambulatory Visit: Payer: Self-pay | Admitting: Internal Medicine

## 2014-02-20 ENCOUNTER — Telehealth: Payer: Self-pay | Admitting: Internal Medicine

## 2014-02-20 MED ORDER — ALPRAZOLAM 0.25 MG PO TABS: ORAL_TABLET | ORAL | Status: DC

## 2014-02-20 NOTE — Telephone Encounter (Signed)
Ok to refill x 1  

## 2014-02-20 NOTE — Telephone Encounter (Signed)
Called to the pharmacy and left on voicemail. 

## 2014-02-20 NOTE — Telephone Encounter (Signed)
CPE scheduled for 06/05/14

## 2014-02-20 NOTE — Telephone Encounter (Signed)
[id number] [place], [place]. Is requesting re-fill on ALPRAZolam (XANAX) 0.25 MG tablet

## 2014-02-21 NOTE — Telephone Encounter (Signed)
Please send re-fill for ALPRAZolam Chelsey [name]) 0.25 MG tablet to [facility]. RX was phoned in to the wrong pharmacy

## 2014-02-22 MED ORDER — ALPRAZOLAM 0.25 MG PO TABS: ORAL_TABLET | ORAL | Status: DC

## 2014-02-22 NOTE — Addendum Note (Signed)
Addended by: Miles Costain T on: 02/22/2014 05:26 PM   Modules accepted: Orders

## 2014-02-22 NOTE — Telephone Encounter (Signed)
Called to the pharmacy and left on voicemail. 

## 2014-02-25 ENCOUNTER — Other Ambulatory Visit: Payer: Self-pay | Admitting: Internal Medicine

## 2014-02-27 NOTE — Telephone Encounter (Signed)
Sent to the pharmacy by e-scribe. 

## 2014-03-23 ENCOUNTER — Other Ambulatory Visit: Payer: Medicare Other

## 2014-03-28 DIAGNOSIS — L821 Other seborrheic keratosis: Secondary | ICD-10-CM | POA: Diagnosis not present

## 2014-03-28 DIAGNOSIS — Z85828 Personal history of other malignant neoplasm of skin: Secondary | ICD-10-CM | POA: Diagnosis not present

## 2014-03-28 DIAGNOSIS — W57XXXA Bitten or stung by nonvenomous insect and other nonvenomous arthropods, initial encounter: Secondary | ICD-10-CM | POA: Diagnosis not present

## 2014-03-28 DIAGNOSIS — D1801 Hemangioma of skin and subcutaneous tissue: Secondary | ICD-10-CM | POA: Diagnosis not present

## 2014-03-28 DIAGNOSIS — T148 Other injury of unspecified body region: Secondary | ICD-10-CM | POA: Diagnosis not present

## 2014-03-28 DIAGNOSIS — L919 Hypertrophic disorder of the skin, unspecified: Secondary | ICD-10-CM | POA: Diagnosis not present

## 2014-03-28 DIAGNOSIS — L909 Atrophic disorder of skin, unspecified: Secondary | ICD-10-CM | POA: Diagnosis not present

## 2014-03-28 DIAGNOSIS — L820 Inflamed seborrheic keratosis: Secondary | ICD-10-CM | POA: Diagnosis not present

## 2014-03-30 ENCOUNTER — Other Ambulatory Visit: Payer: Medicare Other

## 2014-03-30 ENCOUNTER — Encounter: Payer: Medicare Other | Admitting: Oncology

## 2014-04-06 ENCOUNTER — Ambulatory Visit: Payer: Medicare Other | Admitting: Oncology

## 2014-04-19 DIAGNOSIS — M899 Disorder of bone, unspecified: Secondary | ICD-10-CM | POA: Diagnosis not present

## 2014-04-19 DIAGNOSIS — N898 Other specified noninflammatory disorders of vagina: Secondary | ICD-10-CM | POA: Diagnosis not present

## 2014-04-19 DIAGNOSIS — M949 Disorder of cartilage, unspecified: Secondary | ICD-10-CM | POA: Diagnosis not present

## 2014-04-19 DIAGNOSIS — C50919 Malignant neoplasm of unspecified site of unspecified female breast: Secondary | ICD-10-CM | POA: Diagnosis not present

## 2014-05-24 DIAGNOSIS — H01029 Squamous blepharitis unspecified eye, unspecified eyelid: Secondary | ICD-10-CM | POA: Diagnosis not present

## 2014-05-24 DIAGNOSIS — H04129 Dry eye syndrome of unspecified lacrimal gland: Secondary | ICD-10-CM | POA: Diagnosis not present

## 2014-05-24 DIAGNOSIS — E119 Type 2 diabetes mellitus without complications: Secondary | ICD-10-CM | POA: Diagnosis not present

## 2014-05-24 DIAGNOSIS — H25019 Cortical age-related cataract, unspecified eye: Secondary | ICD-10-CM | POA: Diagnosis not present

## 2014-05-24 DIAGNOSIS — H1045 Other chronic allergic conjunctivitis: Secondary | ICD-10-CM | POA: Diagnosis not present

## 2014-05-24 DIAGNOSIS — H20019 Primary iridocyclitis, unspecified eye: Secondary | ICD-10-CM | POA: Diagnosis not present

## 2014-05-24 DIAGNOSIS — H40019 Open angle with borderline findings, low risk, unspecified eye: Secondary | ICD-10-CM | POA: Diagnosis not present

## 2014-05-24 DIAGNOSIS — H2510 Age-related nuclear cataract, unspecified eye: Secondary | ICD-10-CM | POA: Diagnosis not present

## 2014-05-24 LAB — HM DIABETES EYE EXAM

## 2014-05-25 ENCOUNTER — Encounter: Payer: Self-pay | Admitting: Internal Medicine

## 2014-05-29 ENCOUNTER — Other Ambulatory Visit (INDEPENDENT_AMBULATORY_CARE_PROVIDER_SITE_OTHER): Payer: Medicare Other

## 2014-05-29 DIAGNOSIS — I10 Essential (primary) hypertension: Secondary | ICD-10-CM | POA: Diagnosis not present

## 2014-05-29 DIAGNOSIS — E785 Hyperlipidemia, unspecified: Secondary | ICD-10-CM | POA: Diagnosis not present

## 2014-05-29 DIAGNOSIS — R7989 Other specified abnormal findings of blood chemistry: Secondary | ICD-10-CM | POA: Diagnosis not present

## 2014-05-29 DIAGNOSIS — IMO0001 Reserved for inherently not codable concepts without codable children: Secondary | ICD-10-CM

## 2014-05-29 DIAGNOSIS — E1165 Type 2 diabetes mellitus with hyperglycemia: Secondary | ICD-10-CM

## 2014-05-29 DIAGNOSIS — R945 Abnormal results of liver function studies: Secondary | ICD-10-CM

## 2014-05-29 DIAGNOSIS — E039 Hypothyroidism, unspecified: Secondary | ICD-10-CM | POA: Diagnosis not present

## 2014-05-29 LAB — CBC WITH DIFFERENTIAL/PLATELET
Basophils Absolute: 0 10*3/uL (ref 0.0–0.1)
Basophils Relative: 0.7 % (ref 0.0–3.0)
EOS PCT: 2.1 % (ref 0.0–5.0)
Eosinophils Absolute: 0.1 10*3/uL (ref 0.0–0.7)
HCT: 39.6 % (ref 36.0–46.0)
HEMOGLOBIN: 13.4 g/dL (ref 12.0–15.0)
Lymphocytes Relative: 28.5 % (ref 12.0–46.0)
Lymphs Abs: 1.7 10*3/uL (ref 0.7–4.0)
MCHC: 33.8 g/dL (ref 30.0–36.0)
MCV: 92.4 fl (ref 78.0–100.0)
MONOS PCT: 8.4 % (ref 3.0–12.0)
Monocytes Absolute: 0.5 10*3/uL (ref 0.1–1.0)
NEUTROS ABS: 3.6 10*3/uL (ref 1.4–7.7)
Neutrophils Relative %: 60.3 % (ref 43.0–77.0)
PLATELETS: 208 10*3/uL (ref 150.0–400.0)
RBC: 4.28 Mil/uL (ref 3.87–5.11)
RDW: 13.7 % (ref 11.5–15.5)
WBC: 5.9 10*3/uL (ref 4.0–10.5)

## 2014-05-29 LAB — BASIC METABOLIC PANEL
BUN: 13 mg/dL (ref 6–23)
CALCIUM: 9.6 mg/dL (ref 8.4–10.5)
CO2: 27 mEq/L (ref 19–32)
Chloride: 106 mEq/L (ref 96–112)
Creatinine, Ser: 0.7 mg/dL (ref 0.4–1.2)
GFR: 85.84 mL/min (ref >60.00)
Glucose, Bld: 138 mg/dL — ABNORMAL HIGH (ref 70–99)
Potassium: 4.5 mEq/L (ref 3.5–5.1)
Sodium: 142 mEq/L (ref 135–145)

## 2014-05-29 LAB — TSH: TSH: 1.73 u[IU]/mL (ref 0.35–4.50)

## 2014-05-29 LAB — HEPATIC FUNCTION PANEL
ALT: 22 U/L (ref 0–35)
AST: 19 U/L (ref 0–37)
Albumin: 4.1 g/dL (ref 3.5–5.2)
Alkaline Phosphatase: 72 U/L (ref 39–117)
BILIRUBIN TOTAL: 0.7 mg/dL (ref 0.2–1.2)
Bilirubin, Direct: 0.1 mg/dL (ref 0.0–0.3)
Total Protein: 6.9 g/dL (ref 6.0–8.3)

## 2014-05-29 LAB — LIPID PANEL
Cholesterol: 179 mg/dL (ref 0–200)
HDL: 63.6 mg/dL (ref >39.00)
LDL Cholesterol: 89 mg/dL (ref 0–99)
NONHDL: 115.4
Total CHOL/HDL Ratio: 3
Triglycerides: 132 mg/dL (ref 0.0–149.0)
VLDL: 26.4 mg/dL (ref 0.0–40.0)

## 2014-05-29 LAB — HEMOGLOBIN A1C: Hgb A1c MFr Bld: 6.6 % — ABNORMAL HIGH (ref 4.6–6.5)

## 2014-06-05 ENCOUNTER — Encounter: Payer: Self-pay | Admitting: Internal Medicine

## 2014-06-05 ENCOUNTER — Ambulatory Visit (INDEPENDENT_AMBULATORY_CARE_PROVIDER_SITE_OTHER): Payer: Medicare Other | Admitting: Internal Medicine

## 2014-06-05 VITALS — BP 130/60 | Temp 98.9°F | Ht 63.5 in | Wt 187.0 lb

## 2014-06-05 DIAGNOSIS — Z0000 Encounter for general adult medical examination without abnormal findings: Secondary | ICD-10-CM

## 2014-06-05 DIAGNOSIS — F4322 Adjustment disorder with anxiety: Secondary | ICD-10-CM

## 2014-06-05 DIAGNOSIS — E119 Type 2 diabetes mellitus without complications: Secondary | ICD-10-CM

## 2014-06-05 DIAGNOSIS — E785 Hyperlipidemia, unspecified: Secondary | ICD-10-CM | POA: Diagnosis not present

## 2014-06-05 DIAGNOSIS — C50919 Malignant neoplasm of unspecified site of unspecified female breast: Secondary | ICD-10-CM

## 2014-06-05 DIAGNOSIS — G4733 Obstructive sleep apnea (adult) (pediatric): Secondary | ICD-10-CM

## 2014-06-05 DIAGNOSIS — Z9989 Dependence on other enabling machines and devices: Secondary | ICD-10-CM

## 2014-06-05 DIAGNOSIS — M79609 Pain in unspecified limb: Secondary | ICD-10-CM

## 2014-06-05 DIAGNOSIS — M79643 Pain in unspecified hand: Secondary | ICD-10-CM

## 2014-06-05 MED ORDER — TRAZODONE HCL 50 MG PO TABS
25.0000 mg | ORAL_TABLET | Freq: Every evening | ORAL | Status: DC | PRN
Start: 2014-06-05 — End: 2014-10-12

## 2014-06-05 MED ORDER — ALPRAZOLAM 0.25 MG PO TABS: ORAL_TABLET | ORAL | Status: DC

## 2014-06-05 NOTE — Progress Notes (Signed)
Pre visit review using our clinic review tool, if applicable. No additional management support is needed unless otherwise documented below in the visit note.  Chief Complaint  Patient presents with  . Medicare Wellness  . Diabetes  . Hypertension  . Hyperlipidemia    HPI: Patient comes in today for Preventive Health Care visit   Now changed oncologist : at [facility] now.  DM trying   No se of med  No low   LIPIDstaking med 3 x per week poss se  HT. Has been controlled onmed now  Mood :   Trying to gozoloft  Down to  25 per day   Trying to wean and ok  Put on med during dx of breast cancer   New oncologist  ambien  : had se.so nt using   Using xanax.  and taking 1/2  When needed.   And off .  Hands hurt at times ? Arthritis other   Some stiffness   ( has had 2 TKR) Health Maintenance  Topic Date Due  . Foot Exam  04/16/1956  . Zostavax  04/17/2007  . Influenza Vaccine  05/13/2014  . Pneumococcal Polysaccharide Vaccine Age 59 And Over  10/14/2017 (Originally 04/16/2012)  . Hemoglobin A1c  11/29/2014  . Ophthalmology Exam  05/25/2015  . Mammogram  08/16/2015  . Tetanus/tdap  10/03/2020  . Colonoscopy  08/18/2023   Health Maintenance Review LIFESTYLE:  Exercise:   Tobacco/ETS:no Alcohol: 2 per night  Sugar beverages: no Sleep:OSA using machine  Dec  zoloft 25   And help.  Drug use: no Bone density:  Per oncology  Colonoscopy:  Mammogram  Due and  And  q 6 months  And then GYNE  Hearing:  [place]:  No limitations at present . Last eye check UTD cataract right eye   Safety:  Has smoke detector and wears seat belts.  No firearms. No excess sun exposure  Due for dentist   Check   Falls:  No   Advance directive :  Reviewed   Not .   Memory: Felt to be good  , no concern from her or her family.  Depression: No anhedonia unusual crying or depressive symptoms  Nutrition: Eats well balanced diet; adequate calcium and vitamin D. No swallowing chewing  problems.  Injury: no major injuries in the last six months.  Other healthcare providers:  Reviewed today .  Social:  Lives with spouse married.  2 dogs and a rabbit  Mom has breast cancer and dementia  Preventive parameters: up-to-date  Reviewed   ADLS:   There are no problems or need for assistance  driving, feeding, obtaining food, dressing, toileting and bathing, managing money using phone. She is independent.   ROS:  GEN/ HEENT: No fever, significant weight changes sweats headaches vision problems hearing changes, CV/ PULM; No chest pain shortness of breath cough, syncope,edema  change in exercise tolerance. GI /GU: No adominal pain, vomiting, change in bowel habits. No blood in the stool. No significant GU symptoms. SKIN/HEME: ,no acute skin rashes suspicious lesions or bleeding. No lymphadenopathy, nodules, masses.  NEURO/ PSYCH:  No neurologic signs such as weakness numbness. No depression anxiety. IMM/ Allergy: No unusual infections.  Allergy .   REST of 12 system review negative except as per HPI   Past Medical History  Diagnosis Date  . Diabetes mellitus   . Allergy   . Osteoarthritis   . Hypertension   . Hyperlipidemia   . GERD (gastroesophageal reflux  disease)   . Pneumonia 07/28/2011  . Sleep apnea     setting of 10  . Breast cancer   . Colitis   . Sinusitis   . Aphthous ulcer of mouth 2010    related to chemotherapy   . Carpal tunnel syndrome of right wrist   . Squamous cell carcinoma     left leg  . Colon polyp   . Diverticulosis   . Lymphedema 08-31-13    left arm    Family History  Problem Relation Age of Onset  . Alzheimer's disease Mother   . Sleep apnea Mother   . Diabetes Mother   . Hypertension Mother   . Hyperlipidemia Mother   . Breast cancer Mother 53    triple negative  . Anxiety disorder Daughter   . Hypertension Son     pulmonary  . Congenital heart disease Son 0    TGA  died age 100     History   Social History  . Marital  Status: Married    Spouse Name: N/A    Number of Children: 3  . 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 Smoking status: Never Smoker   . Smokeless tobacco: Never Used  . Alcohol Use: 3.6 oz/week    6 Glasses of wine per week     Comment: 1-2 glasses every other night  . Drug Use: No  . Sexual Activity: Yes    Birth Control/ Protection: Post-menopausal, Surgical   Other Topics Concern  . 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    Outpatient Encounter Prescriptions as of 06/05/2014  Medication Sig  . ALPRAZolam (XANAX) 0.25 MG tablet take 1 tablet by mouth at bedtime if needed for anxiety  . anastrozole (ARIMIDEX) 1 MG tablet Take 1 tablet (1 mg total) by mouth daily.  . cycloSPORINE (RESTASIS) 0.05 % ophthalmic emulsion Place 1 drop into both eyes daily.  Marland Kitchen esomeprazole (NEXIUM) 40 MG capsule Take 40 mg by mouth daily at 12 noon. Takes bedtime  . losartan (COZAAR) 50 MG tablet take 1 tablet by mouth once daily  . losartan (COZAAR) 50 MG tablet TAKE 1 TABLET BY MOUTH EVERY DAY  . metFORMIN (GLUCOPHAGE-XR) 500 MG 24 hr tablet take 2 tablets by mouth every morning WITH BREAKFAST AND 1 TABLET WITH EVENING MEAL  . metFORMIN (GLUMETZA) 500 MG (MOD) 24 hr tablet 1,500 mg daily. 2 tablets with breakfast (1000 mg)  and 1 tablet with dinner (500 mg) = 1500 per day  . methocarbamol (ROBAXIN) 500 MG tablet Take 1 tablet (500 mg total) by mouth every 6 (six) hours as needed for muscle spasms.  Marland Kitchen oxyCODONE (OXY IR/ROXICODONE) 5 MG immediate release tablet   . rosuvastatin (CRESTOR) 10 MG tablet Take 10 mg by mouth every evening.  . sertraline (ZOLOFT) 50 MG tablet Take 1 tablet (50 mg total) by mouth every evening.  . sertraline (ZOLOFT) 50 MG tablet take 1 tablet by mouth once daily  . traMADol  (ULTRAM) 50 MG tablet Take 1-2 tablets (50-100 mg total) by mouth every 6 (six) hours as needed (mild pain).  . traZODone (DESYREL) 50 MG tablet Take 0.5-1 tablets (25-50 mg total) by mouth at bedtime as needed  for sleep.  . [DISCONTINUED] ALPRAZolam (XANAX) 0.25 MG tablet take 1 tablet by mouth at bedtime if needed for sleep or anxiety  . [DISCONTINUED] losartan (COZAAR) 50 MG tablet Take 50 mg by mouth at bedtime.  . [DISCONTINUED] zolpidem (AMBIEN) 10 MG tablet Take 10 mg by mouth at bedtime as needed for sleep.    EXAM:  BP 130/60  Temp(Src) 98.9 F (37.2 C) (Oral)  Ht 5' 3.5" (1.613 m)  Wt 187 lb (84.823 kg)  BMI 32.60 kg/m2  Body mass index is 32.6 kg/(m^2).  Physical Exam: Vital signs reviewed IWP:[id number] is a well-developed well-nourished alert cooperative    who appearsr stated age in no acute distress.  HEENT: normocephalic atraumatic , Eyes: PERRL EOM's full, conjunctiva clear, Nares: paten,t no deformity discharge or tenderness., Ears: no deformity EAC's clear TMs with normal landmarks. Mouth: clear OP, no lesions, edema.  Moist mucous membranes. Dentition in adequate repair. NECK: supple without masses, thyromegaly or bruits. CHEST/PULM:  Clear to auscultation and percussion breath sounds equal no wheeze , rales or rhonchi. No chest wall deformities or tenderness. CV: PMI is nondisplaced, S1 S2 no gallops, murmurs, rubs. Peripheral pulses are full without delay.No JVD .  ABDOMEN: Bowel sounds normal nontender  No guard or rebound, no hepato splenomegal no CVA tenderness.  No hernia. Extremtities:  No clubbing cyanosis or edema, no acute joint swelling or redness no focal atrophy OA changes  Distal nodules well healed scars on knees  NEURO:  Oriented x3, cranial nerves 3-12 appear to be intact, no obvious focal weakness,gait within normal limits no abnormal reflexes or asymmetrical SKIN: No acute rashes normal turgor, color, no bruising or petechiae. PSYCH: Oriented, good eye  contact, no obvious depression anxiety, cognition and judgment appear normal. LN: no cervical axillary inguinal adenopathy Diabetic foot exam  Lab Results  Component Value Date   WBC 5.9 05/29/2014   HGB 13.4 05/29/2014   HCT 39.6 05/29/2014   PLT 208.0 05/29/2014   GLUCOSE 138* 05/29/2014   CHOL 179 05/29/2014   TRIG 132.0 05/29/2014   HDL 63.60 05/29/2014   LDLDIRECT 173.9 11/09/2013   LDLCALC 89 05/29/2014   ALT 22 05/29/2014   AST 19 05/29/2014   NA 142 05/29/2014   K 4.5 05/29/2014   CL 106 05/29/2014   CREATININE 0.7 05/29/2014   BUN 13 05/29/2014   CO2 27 05/29/2014   TSH 1.73 05/29/2014   INR 0.93 08/31/2013   HGBA1C 6.6* 05/29/2014   MICROALBUR 0.6 02/14/2009    ASSESSMENT AND PLAN:  Discussed the following assessment and plan:  Medicare annual wellness visit, initial  HYPERLIPIDEMIA - only taking 3 x per week ? se  ok to continue based on ldl  Type 2 diabetes mellitus without complication - continue taking 1500 met per day  MALIGNANT NEOPLASM OF BREAST UNSPECIFIED SITE  OBSTRUCTIVE SLEEP APNEA  Visit for preventive health examination  Pain of hand, unspecified laterality - prob oa get x ray consider rheum check if progressive  - Plan: DG Hand 2 View Left, DG Hand 2 View Right  OSA on CPAP  Adjustment disorder with anxious mood - better  waening off zoloft but asks for alprazolam in case needes  disc risk and beneft and dependency of these meds.  Patient Care Team: Burnis Medin, MD as PCP - General Gearlean Alf, MD as Attending Physician (Orthopedic Surgery) Lafayette Dragon, MD as Consulting Physician (Gastroenterology) Harvie Heck, MD as Physician Assistant (Internal Medicine) Deneise Lever, MD  as Consulting Physician (Pulmonary Disease) Patient Instructions    Avoid using   Alprazolam  On a regular  Basis .   Can try trazodone  Instead if needed.  Non dependent producing .  You have findings of osteoarthritis  But if wish can see rheumatology if  persistent or  progressive or concern n about other types of arthritis .  Can get x ray in the interim.   .  Continue lifestyle intervention healthy eating and exercise .  Healthy lifestyle includes : At least 150 minutes of exercise weeks  , weight at healthy levels, which is usually   BMI 19-25. Avoid trans fats and processed foods;  Increase fresh fruits and veges to 5 servings per day. And avoid sweet beverages including tea and juice. Mediterranean diet with olive oil and nuts have been noted to be heart and brain healthy . Avoid tobacco products . Limit  alcohol to  7 per week for women and 14 servings for men.  Get adequate sleep .       Standley Brooking. Samanth Mirkin M.D.

## 2014-06-05 NOTE — Patient Instructions (Signed)
   Avoid using   Alprazolam  On a regular  Basis .   Can try trazodone  Instead if needed.  Non dependent producing .  You have findings of osteoarthritis  But if wish can see rheumatology if  persistent or progressive or concern n about other types of arthritis .  Can get x ray in the interim.   .  Continue lifestyle intervention healthy eating and exercise .  Healthy lifestyle includes : At least 150 minutes of exercise weeks  , weight at healthy levels, which is usually   BMI 19-25. Avoid trans fats and processed foods;  Increase fresh fruits and veges to 5 servings per day. And avoid sweet beverages including tea and juice. Mediterranean diet with olive oil and nuts have been noted to be heart and brain healthy . Avoid tobacco products . Limit  alcohol to  7 per week for women and 14 servings for men.  Get adequate sleep .

## 2014-06-08 ENCOUNTER — Other Ambulatory Visit: Payer: Medicare Other

## 2014-06-08 DIAGNOSIS — Z0000 Encounter for general adult medical examination without abnormal findings: Secondary | ICD-10-CM | POA: Insufficient documentation

## 2014-06-13 ENCOUNTER — Other Ambulatory Visit: Payer: Self-pay | Admitting: Internal Medicine

## 2014-06-13 NOTE — Telephone Encounter (Signed)
Sent to the pharmacy by e-scribe. 

## 2014-06-15 ENCOUNTER — Encounter: Payer: Medicare Other | Admitting: Oncology

## 2014-06-15 NOTE — Progress Notes (Signed)
[facility]  [phone] [phone]  OFFICE PROGRESS NOTE   ID: Chelsey [name]   DOB: 09/19/1946  MR#: [id number]  FIE#:[id number]   PCP: Lottie Dawson, MD GYN:  SU: Erroll Luna, M.D. PLA SU: Ashley Mariner, M.D.  RAD ONC: Thea Silversmith, M.D.   HISTORY OF PRESENT ILLNESS: From Dr. Collier Salina Rubin's new patient evaluation note dated 04/16/2009: "This is a pleasant 68 year old woman who is seen with her husband, Timmothy Sours, today for evaluation and treatment of breast cancer. This is a pleasant woman who is from [place].  She has had annual mammograms till about 2006 when she has had multiple other things going on that has precluded her from having mammogram.  She did notice a mass in her outer aspect of the left breast about a month ago.  She sought medical attention for this.  She did have a mammogram on 04/04/2009 following a biopsy prior to that.  A mammogram was performed with an ultrasound showing a mass measuring 2.9 x 2.3 cm.  A biopsy was recommended.  Biopsy performed on the same day showed a low-grade invasive ductal cancer, strongly ER and PR positive at 99% respectively.  HER-2 was 0.  Proliferative index was 30%.  The patient subsequently had an MRI scan of both breasts on 04/11/2009 and the left breast showed multiple rounded confluent enhancing masses centering the lateral aspect of the left breast.  This involved all 4 quadrants of the breast measuring 9.5 x 5.5 x 5.3 cm.  No evidence of malignancy was seen.  She subsequently was referred to Dr. Brantley Stage, who has tentatively scheduled her for lumpectomy and then since I sent her for consideration of neoadjuvant therapy after the results of the MRI were reviewed."  Her subsequent history is as detailed below.   INTERVAL HISTORY: Dr. Jana Hakim and I saw Chelsey [name] today for followup of invasive ductal carcinoma of the left breast.  The patient was last seen by Dr. Truddie Coco on 09/06/2012.  Since her  last office visit, the patient has been doing relatively well.  Her interval history is significant for having left knee arthroscopy in 12/2012.  She states she is due to have right knee arthroscopy this winter.  She is establishing herself with Dr. Virgie Dad service today.   REVIEW OF SYSTEMS: A 10 point review of systems was completed and is negative except chronic arthralgias and myalgias, left upper extremity lymphedema, and vaginal dryness.  The patient states it's hard to differentiate any discomfort she may be having from Arimidex from her ongoing chronic arthralgias and myalgias.  Chelsey [name] denies any other symptomatology.   PAST MEDICAL HISTORY: Past Medical History  Diagnosis Date  . Diabetes mellitus   . Allergy   . Osteoarthritis   . Hypertension   . Hyperlipidemia   . GERD (gastroesophageal reflux disease)   . Pneumonia 07/28/2011  . Sleep apnea     setting of 10  . Breast cancer   . Colitis   . Sinusitis   . Aphthous ulcer of mouth 2010    related to chemotherapy   . Carpal tunnel syndrome of right wrist   . Squamous cell carcinoma     left leg  . Colon polyp   . Diverticulosis   . Lymphedema 08-31-13    left arm    PAST SURGICAL HISTORY: Past Surgical History  Procedure Laterality Date  . Cesarean section      x 3  . Reconstruction breast immediate /  delayed w/ tissue expander Left   . Skin graft    . Tonsillectomy      as child  . Total knee arthroplasty Left 12/20/2012    Procedure: TOTAL KNEE ARTHROPLASTY;  Surgeon: Gearlean Alf, MD;  Location: [facility];  Service: Orthopedics;  Laterality: Left;  . Tubal ligation    . Squamous cell carcinoma excision Left     Left lower extremity  . Mastectomy Left 11/23    2010  . Total knee arthroplasty Right 09/12/2013    Procedure: RIGHT TOTAL KNEE ARTHROPLASTY;  Surgeon: Gearlean Alf, MD;  Location: [facility];  Service: Orthopedics;  Laterality: Right;  Past surgical history includes previous biopsy of left  breast in 1980 for a cystic area.   FAMILY HISTORY Family History  Problem Relation Age of Onset  . Alzheimer's disease Mother   . Sleep apnea Mother   . Diabetes Mother   . Hypertension Mother   . Hyperlipidemia Mother   . Breast cancer Mother 48    triple negative  . Anxiety disorder Daughter   . Hypertension Son     pulmonary  . Congenital heart disease Son 0    TGA  died age 62   She is the only child.  Mother is still living and resides in a nursing home.  Her mother has history of Alzheimer's.  Father died at age 3 from a motor vehicle accident.  There is no other history of breast or ovarian cancer in the family.   GYNECOLOGIC HISTORY: She is G3, P3, menarche age 68, menopause age 50.  No significant hormone replacement therapy.   SOCIAL HISTORY: Chelsey [name] and her husband on have been married since 1968.  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  They have 2 adult children, one son and one daughter.  2 children age 55 and 83.  She has one son that passed away from congenital heart disease at age of 25.  There older son is divorced and has a 59-year-old daughter.  Her older son resides with Mrs. Dibiasio in her home.  Her granddaughter resides mostly in [place], [place].  Mrs. Mandley has 2 dogs and a rabbit.  In her spare time she enjoys swimming, dining out with friends, and writing her indoor recumbent bicycle.    ADVANCED DIRECTIVES: Not on file   HEALTH MAINTENANCE: History  Substance Use Topics  . Smoking status: Never Smoker   . Smokeless tobacco: Never Used  . Alcohol Use: 3.6 oz/week    6 Glasses of wine per week     Comment: 1-2 glasses every other night    Colonoscopy: 10/13/2002 (colonoscopy is currently past due) PAP: 08/15/2011 Bone density: The patient's last bone density scan on 08/13/2012 showed a T score of -2.0 (osteopenia). Lipid panel: Not on file  Allergies  Allergen Reactions  . Ambien [Zolpidem  Tartrate]     Felt bad hard to function  . Amoxicillin     REACTION: unspecified  . Lipitor [Atorvastatin Calcium]     Leg cramps  . Penicillins     As child  . Prednisone Itching  . Latex Rash    Current Outpatient Prescriptions  Medication Sig Dispense Refill  . ALPRAZolam (XANAX) 0.25 MG tablet take 1 tablet by mouth at bedtime if needed for anxiety  40 tablet  0  . anastrozole (ARIMIDEX) 1 MG tablet Take 1 tablet (1 mg total) by mouth daily.  90 tablet  5  . CRESTOR 10 MG tablet take 1 tablet by mouth once daily  30 tablet  11  . cycloSPORINE (RESTASIS) 0.05 % ophthalmic emulsion Place 1 drop into both eyes daily.      Marland Kitchen esomeprazole (NEXIUM) 40 MG capsule Take 40 mg by mouth daily at 12 noon. Takes bedtime      . losartan (COZAAR) 50 MG tablet take 1 tablet by mouth once daily  90 tablet  1  . losartan (COZAAR) 50 MG tablet TAKE 1 TABLET BY MOUTH EVERY DAY  90 tablet  0  . metFORMIN (GLUCOPHAGE-XR) 500 MG 24 hr tablet take 2 tablets by mouth every morning WITH BREAKFAST AND 1 TABLET WITH EVENING MEAL  90 tablet  5  . metFORMIN (GLUMETZA) 500 MG (MOD) 24 hr tablet 1,500 mg daily. 2 tablets with breakfast (1000 mg)  and 1 tablet with dinner (500 mg) = 1500 per day      . methocarbamol (ROBAXIN) 500 MG tablet Take 1 tablet (500 mg total) by mouth every 6 (six) hours as needed for muscle spasms.  80 tablet  0  . oxyCODONE (OXY IR/ROXICODONE) 5 MG immediate release tablet       . sertraline (ZOLOFT) 50 MG tablet Take 1 tablet (50 mg total) by mouth every evening.  90 tablet  0  . sertraline (ZOLOFT) 50 MG tablet take 1 tablet by mouth once daily  90 tablet  1  . traMADol (ULTRAM) 50 MG tablet Take 1-2 tablets (50-100 mg total) by mouth every 6 (six) hours as needed (mild pain).  60 tablet  0  . traZODone (DESYREL) 50 MG tablet Take 0.5-1 tablets (25-50 mg total) by mouth at bedtime as needed for sleep.  30 tablet  3   No current facility-administered medications for this visit.     OBJECTIVE: There were no vitals filed for this visit.   There is no weight on file to calculate BMI.      ECOG FS: 1 - Symptomatic but completely ambulatory  General appearance: Alert, cooperative, well nourished, no apparent distress Head: Normocephalic, without obvious abnormality, atraumatic Eyes: Arcus senilis, injected sclerae, PERRLA, EOMI Nose: Nares, septum and mucosa are normal, no drainage or sinus tenderness Neck: No adenopathy, supple, symmetrical, trachea midline, thyroid not enlarged, no tenderness Resp: Clear to auscultation bilaterally Cardio: Regular rate and rhythm, S1, S2 normal, no murmur, click, rub or gallop Breasts: Left breast has been reconstructed and is firm, left breast has numerous well-healed surgical scars, right breast is status post reduction and has numerous well-healed surgical scars, both breast have architectural changes, left breast has radiation changes noted, both breasts have a glandular texture, no lymphadenopathy, no right breast nipple inversion, bilateral axillary fullness  GI: Soft, distended, non-tender, hypoactive bowel sounds, no organomegaly Extremities: Extremities normal, atraumatic, no cyanosis, left upper extremity lymphedema Lymph nodes: Cervical, supraclavicular, and axillary nodes normal Neurologic: Grossly normal    LAB RESULTS: Lab Results  Component Value Date   WBC 5.9 05/29/2014   NEUTROABS 3.6 05/29/2014   HGB 13.4 05/29/2014   HCT 39.6 05/29/2014   MCV 92.4 05/29/2014   PLT 208.0 05/29/2014      Chemistry      Component Value Date/Time   NA 142 05/29/2014 0844   NA 141 03/30/2013 1020   K 4.5 05/29/2014 0844   K 3.9 03/30/2013 1020   CL 106 05/29/2014 0844   CL 105 03/30/2013 1020   CO2 27 05/29/2014 0844   CO2 25  03/30/2013 1020   BUN 13 05/29/2014 0844   BUN 10.3 03/30/2013 1020   CREATININE 0.7 05/29/2014 0844   CREATININE 0.7 03/30/2013 1020      Component Value Date/Time   CALCIUM 9.6 05/29/2014 0844   CALCIUM  10.0 03/30/2013 1020   ALKPHOS 72 05/29/2014 0844   ALKPHOS 95 03/30/2013 1020   AST 19 05/29/2014 0844   AST 16 03/30/2013 1020   ALT 22 05/29/2014 0844   ALT 16 03/30/2013 1020   BILITOT 0.7 05/29/2014 0844   BILITOT 0.51 03/30/2013 1020      Lab Results  Component Value Date   LABCA2 25 09/06/2012    Urinalysis    Component Value Date/Time   COLORURINE AMBER* 08/31/2013 0935   APPEARANCEUR CLOUDY* 08/31/2013 0935   LABSPEC 1.034* 08/31/2013 0935   LABSPEC 1.005 05/09/2009 0900   PHURINE 5.5 08/31/2013 0935   GLUCOSEU NEGATIVE 08/31/2013 0935   GLUCOSEU NEGATIVE 07/08/2011 1122   HGBUR NEGATIVE 08/31/2013 0935   HGBUR small 08/20/2010 1051   BILIRUBINUR SMALL* 08/31/2013 0935   BILIRUBINUR neg 01/21/2013 1513   KETONESUR NEGATIVE 08/31/2013 0935   PROTEINUR NEGATIVE 08/31/2013 0935   PROTEINUR Neg 01/21/2013 1513   UROBILINOGEN 0.2 08/31/2013 0935   UROBILINOGEN 0.2 01/21/2013 1513   NITRITE NEGATIVE 08/31/2013 0935   NITRITE Neg 01/21/2013 1513   LEUKOCYTESUR NEGATIVE 08/31/2013 0935    STUDIES: 1.  The patient's last unilateral right digital screening mammogram on 08/15/2013  Was unremarkable  2.  The patient's last bone density scan on 08/13/2012 showed a T score of -2.0 (osteopenia).   ASSESSMENT: 68 y.o. [place] woman s/p left breast biopsy June of 2010 for a clinical T3 NX invasive ductal carcinoma, low-grade, estrogen and progesterone receptor both strongly positive, HER-2 not amplified, with an MIB-1 of 30%.  (1) treated neoadjuvantly with fluorouracil, epirubicin and cyclophosphamide x4 followed by docetaxel x4, all chemotherapy completed in October of 2010.  (2) On November 2010 she underwent left mastectomy with sentinel lymph node sampling for a residual ypT2 ypN1, stage IIB invasive ductal carcinoma, grade 2. HER-2 was repeated and was again negative.  (3) She underwent full axillary lymph node dissection January of 2011 with 0 of 13 additional lymph nodes  removed involved by tumor (overall she had 2 of 16 lymph nodes positive).   (4) completed adjuvant radiation treatments April of 2011  (5) the start of antiestrogen therapy was delayed because of complications related to her reconstruction. She did start anastrozole April of 2011.    10.  Osteopenia  11.  Vaginal dryness  12.  Left upper extremity lymphedema   PLAN:  06/15/2014, 8:36 AM    Chauncey Cruel, MD

## 2014-07-28 DIAGNOSIS — Z471 Aftercare following joint replacement surgery: Secondary | ICD-10-CM | POA: Diagnosis not present

## 2014-07-28 DIAGNOSIS — Z96652 Presence of left artificial knee joint: Secondary | ICD-10-CM | POA: Diagnosis not present

## 2014-07-28 DIAGNOSIS — Z96651 Presence of right artificial knee joint: Secondary | ICD-10-CM | POA: Diagnosis not present

## 2014-07-30 ENCOUNTER — Other Ambulatory Visit: Payer: Self-pay | Admitting: Internal Medicine

## 2014-07-31 NOTE — Telephone Encounter (Signed)
Pt has appt on 12/06/13 and sent to the pharmacy by e-scribe.

## 2014-08-04 ENCOUNTER — Other Ambulatory Visit: Payer: Self-pay | Admitting: Internal Medicine

## 2014-08-07 ENCOUNTER — Ambulatory Visit (INDEPENDENT_AMBULATORY_CARE_PROVIDER_SITE_OTHER): Payer: Medicare Other | Admitting: Internal Medicine

## 2014-08-07 ENCOUNTER — Encounter: Payer: Self-pay | Admitting: Internal Medicine

## 2014-08-07 VITALS — BP 116/72 | Temp 99.3°F | Wt 190.0 lb

## 2014-08-07 DIAGNOSIS — F4322 Adjustment disorder with anxiety: Secondary | ICD-10-CM | POA: Diagnosis not present

## 2014-08-07 DIAGNOSIS — G4733 Obstructive sleep apnea (adult) (pediatric): Secondary | ICD-10-CM | POA: Diagnosis not present

## 2014-08-07 DIAGNOSIS — Z9989 Dependence on other enabling machines and devices: Secondary | ICD-10-CM

## 2014-08-07 DIAGNOSIS — J0191 Acute recurrent sinusitis, unspecified: Secondary | ICD-10-CM

## 2014-08-07 DIAGNOSIS — J019 Acute sinusitis, unspecified: Secondary | ICD-10-CM | POA: Insufficient documentation

## 2014-08-07 MED ORDER — CEFUROXIME AXETIL 500 MG PO TABS: 500.0000 mg | ORAL_TABLET | Freq: Two times a day (BID) | ORAL | Status: DC

## 2014-08-07 NOTE — Patient Instructions (Signed)
Antibiotic  For 5-7 days for sinusitis  Chest is clear today .  Saline nose spray to help  Let us know how we can help.

## 2014-08-07 NOTE — Progress Notes (Signed)
Pre visit review using our clinic review tool, if applicable. No additional management support is needed unless otherwise documented below in the visit note.  Chief Complaint  Patient presents with  . Cough    Started 4-5 days ago.  Has tried OTC Mucinex.  Nasal Congestion is green and bloody  . Sore Throat  . Nasal Congestion  . Generalized Body Aches  . Chills  . Sinus Pressure/Pain    HPI: Patient Chelsey [name]  comes in today for SDA for  new problem evaluation. See above . Has a history of recurrent sinusitis hasn't been bothering her for a while but is on sleep apnea machine CPAP Mother passed away last month has been very busy with stress. ocass xanax Nasal congestion discharge over the last for 5 days facial discomfort bilateral blood and thick discolored nasal discharge. Minimal cough no fever. ROS: See pertinent positives and negatives per HPI. No chest pain shortness of breath following  Past Medical History  Diagnosis Date  . Diabetes mellitus   . Allergy   . Osteoarthritis   . Hypertension   . Hyperlipidemia   . GERD (gastroesophageal reflux disease)   . Pneumonia 07/28/2011  . Sleep apnea     setting of 10  . Breast cancer   . Colitis   . Sinusitis   . Aphthous ulcer of mouth 2010    related to chemotherapy   . Carpal tunnel syndrome of right wrist   . Squamous cell carcinoma     left leg  . Colon polyp   . Diverticulosis   . Lymphedema 08-31-13    left arm    Family History  Problem Relation Age of Onset  . Alzheimer's disease Mother   . Sleep apnea Mother   . Diabetes Mother   . Hypertension Mother   . Hyperlipidemia Mother   . Breast cancer Mother 29    triple negative  . Anxiety disorder Daughter   . Hypertension Son     pulmonary  . Congenital heart disease Son 0    TGA  died age 52     History   Social History  . Marital Status: Married    Spouse Name: N/A    Number of Children: 3  . Years of Education: N/A   Occupational  History  . realtor   . Realtor    Social History Main Topics  . Smoking status: Never Smoker   . Smokeless tobacco: Never Used  . Alcohol Use: 3.6 oz/week    6 Glasses of wine per week     Comment: 1-2 glasses every other night  . Drug Use: No  . Sexual Activity: Yes    Birth Control/ Protection: Post-menopausal, Surgical   Other Topics Concern  . 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   Passed away sept 81 15  Age 68    Outpatient Encounter Prescriptions as of 08/07/2014  Medication Sig  . ALPRAZolam (XANAX) 0.25 MG tablet take 1 tablet by mouth at bedtime if needed for anxiety  . anastrozole (ARIMIDEX) 1 MG tablet Take 1 tablet (1 mg total) by mouth daily.  . CRESTOR 10 MG tablet take 1 tablet by mouth once daily  . cycloSPORINE (RESTASIS) 0.05 % ophthalmic emulsion Place 1 drop into both eyes daily.  Marland Kitchen esomeprazole (  NEXIUM) 40 MG capsule Take 40 mg by mouth daily at 12 noon. Takes bedtime  . losartan (COZAAR) 50 MG tablet TAKE 1 TABLET BY MOUTH EVERY DAY  . losartan (COZAAR) 50 MG tablet take 1 tablet by mouth once daily  . metFORMIN (GLUCOPHAGE-XR) 500 MG 24 hr tablet take 2 tablets by mouth every morning WITH BREAKFAST AND 1 TABLET WITH EVENING MEAL  . metFORMIN (GLUMETZA) 500 MG (MOD) 24 hr tablet 1,500 mg daily. 2 tablets with breakfast (1000 mg)  and 1 tablet with dinner (500 mg) = 1500 per day  . methocarbamol (ROBAXIN) 500 MG tablet Take 1 tablet (500 mg total) by mouth every 6 (six) hours as needed for muscle spasms.  Marland Kitchen sertraline (ZOLOFT) 50 MG tablet Take 1 tablet (50 mg total) by mouth every evening.  . sertraline (ZOLOFT) 50 MG tablet take 1 tablet by mouth once daily  . traZODone (DESYREL) 50 MG tablet Take 0.5-1 tablets (25-50 mg total) by mouth at bedtime as needed for sleep.  . cefUROXime (CEFTIN) 500 MG tablet Take 1 tablet (500 mg  total) by mouth 2 (two) times daily.  . [DISCONTINUED] oxyCODONE (OXY IR/ROXICODONE) 5 MG immediate release tablet   . [DISCONTINUED] traMADol (ULTRAM) 50 MG tablet Take 1-2 tablets (50-100 mg total) by mouth every 6 (six) hours as needed (mild pain).    EXAM:  BP 116/72  Temp(Src) 99.3 F (37.4 C) (Oral)  Wt 190 lb (86.183 kg)  Body mass index is 33.12 kg/(m^2).  GENERAL: vitals reviewed and listed above, alert, oriented, appears well hydrated and in no acute distress quite congested in no acute distress nontoxic HEENT: atraumatic, conjunctiva  clear, no obvious abnormalities on inspection of external nose and ears TMs are clear nares congested no discharge seen face plus minus tender OP : no lesion edema or exudate low-lying palate drainage tracts NECK: no obvious masses on inspection palpation  LUNGS: clear to auscultation bilaterally, no wheezes, rales or rhonchi, good air movement CV: HRRR, no clubbing cyanosisnl cap refill  MS: moves all extremities   PSYCH: pleasant and cooperative, no obvious depression or anxiety  ASSESSMENT AND PLAN:  Discussed the following assessment and plan:  Acute recurrent sinusitis, unspecified location  OSA on CPAP - undercare   Adjustment disorder with anxious mood - better  now recent death of mom History of allergic to penicillin when the child possibly rash has been able to take Ceftin that she had leg cramps with Levaquin Saline nose spray Flonase or Nasacort and can add 7 days of the antibiotic; expectant management. Not taking Xanax very much now but trazodone has been helpful or have the pharmacy or herself call for refills. -Patient advised to return or notify health care team  if symptoms worsen ,persist or new concerns arise.  Patient Instructions  Antibiotic  For 5-7 days for sinusitis  Chest is clear today .  Saline nose spray to help  Let us know how we can help.    Standley Brooking. Ajay Strubel M.D.

## 2014-08-09 ENCOUNTER — Other Ambulatory Visit: Payer: Self-pay

## 2014-08-09 DIAGNOSIS — Z1231 Encounter for screening mammogram for malignant neoplasm of breast: Secondary | ICD-10-CM

## 2014-08-09 DIAGNOSIS — Z9012 Acquired absence of left breast and nipple: Secondary | ICD-10-CM

## 2014-08-21 DIAGNOSIS — H10413 Chronic giant papillary conjunctivitis, bilateral: Secondary | ICD-10-CM | POA: Diagnosis not present

## 2014-08-21 DIAGNOSIS — H02831 Dermatochalasis of right upper eyelid: Secondary | ICD-10-CM | POA: Diagnosis not present

## 2014-08-21 DIAGNOSIS — H02834 Dermatochalasis of left upper eyelid: Secondary | ICD-10-CM | POA: Diagnosis not present

## 2014-08-24 DIAGNOSIS — H10413 Chronic giant papillary conjunctivitis, bilateral: Secondary | ICD-10-CM | POA: Diagnosis not present

## 2014-08-24 DIAGNOSIS — H04123 Dry eye syndrome of bilateral lacrimal glands: Secondary | ICD-10-CM | POA: Diagnosis not present

## 2014-08-24 DIAGNOSIS — H20013 Primary iridocyclitis, bilateral: Secondary | ICD-10-CM | POA: Diagnosis not present

## 2014-08-24 DIAGNOSIS — H01029 Squamous blepharitis unspecified eye, unspecified eyelid: Secondary | ICD-10-CM | POA: Diagnosis not present

## 2014-08-25 ENCOUNTER — Other Ambulatory Visit (INDEPENDENT_AMBULATORY_CARE_PROVIDER_SITE_OTHER): Payer: Self-pay | Admitting: Surgery

## 2014-08-25 DIAGNOSIS — M810 Age-related osteoporosis without current pathological fracture: Secondary | ICD-10-CM

## 2014-08-25 DIAGNOSIS — Z853 Personal history of malignant neoplasm of breast: Secondary | ICD-10-CM | POA: Diagnosis not present

## 2014-08-28 DIAGNOSIS — H2513 Age-related nuclear cataract, bilateral: Secondary | ICD-10-CM | POA: Diagnosis not present

## 2014-08-28 DIAGNOSIS — H02831 Dermatochalasis of right upper eyelid: Secondary | ICD-10-CM | POA: Diagnosis not present

## 2014-08-28 DIAGNOSIS — H02834 Dermatochalasis of left upper eyelid: Secondary | ICD-10-CM | POA: Diagnosis not present

## 2014-08-29 ENCOUNTER — Ambulatory Visit
Admission: RE | Admit: 2014-08-29 | Discharge: 2014-08-29 | Disposition: A | Discharge status: Home / Self Care | Payer: Medicare Other | Source: Ambulatory Visit

## 2014-08-29 DIAGNOSIS — Z1231 Encounter for screening mammogram for malignant neoplasm of breast: Secondary | ICD-10-CM | POA: Diagnosis not present

## 2014-08-29 DIAGNOSIS — Z9012 Acquired absence of left breast and nipple: Secondary | ICD-10-CM

## 2014-09-05 DIAGNOSIS — Z853 Personal history of malignant neoplasm of breast: Secondary | ICD-10-CM | POA: Diagnosis not present

## 2014-09-05 DIAGNOSIS — Z91040 Latex allergy status: Secondary | ICD-10-CM | POA: Diagnosis not present

## 2014-09-05 DIAGNOSIS — I10 Essential (primary) hypertension: Secondary | ICD-10-CM | POA: Diagnosis not present

## 2014-09-05 DIAGNOSIS — Z79899 Other long term (current) drug therapy: Secondary | ICD-10-CM | POA: Diagnosis not present

## 2014-09-05 DIAGNOSIS — Z9882 Breast implant status: Secondary | ICD-10-CM | POA: Diagnosis not present

## 2014-09-05 DIAGNOSIS — Z880 Allergy status to penicillin: Secondary | ICD-10-CM | POA: Diagnosis not present

## 2014-09-05 DIAGNOSIS — G4730 Sleep apnea, unspecified: Secondary | ICD-10-CM | POA: Diagnosis not present

## 2014-09-05 DIAGNOSIS — E785 Hyperlipidemia, unspecified: Secondary | ICD-10-CM | POA: Diagnosis not present

## 2014-09-05 DIAGNOSIS — Z9012 Acquired absence of left breast and nipple: Secondary | ICD-10-CM | POA: Diagnosis not present

## 2014-09-05 DIAGNOSIS — Z888 Allergy status to other drugs, medicaments and biological substances status: Secondary | ICD-10-CM | POA: Diagnosis not present

## 2014-09-06 ENCOUNTER — Other Ambulatory Visit: Payer: Self-pay | Admitting: Internal Medicine

## 2014-09-06 NOTE — Telephone Encounter (Signed)
Called to the pharmacy and left on voicemail. 

## 2014-09-06 NOTE — Telephone Encounter (Signed)
Ok x 1

## 2014-09-13 ENCOUNTER — Ambulatory Visit
Admission: RE | Admit: 2014-09-13 | Discharge: 2014-09-13 | Disposition: A | Discharge status: Home / Self Care | Payer: Medicare Other | Source: Ambulatory Visit | Attending: Surgery | Admitting: Surgery

## 2014-09-13 DIAGNOSIS — M810 Age-related osteoporosis without current pathological fracture: Secondary | ICD-10-CM

## 2014-10-12 ENCOUNTER — Other Ambulatory Visit: Payer: Self-pay | Admitting: Internal Medicine

## 2014-10-15 ENCOUNTER — Other Ambulatory Visit: Payer: Self-pay | Admitting: Internal Medicine

## 2014-10-16 ENCOUNTER — Ambulatory Visit: Payer: Medicare Other | Admitting: Family Medicine

## 2014-10-16 NOTE — Telephone Encounter (Signed)
Has not been filled recently.  Will check with [facility] for refills.

## 2014-10-18 NOTE — Telephone Encounter (Signed)
Ok to refill x 6 months 

## 2014-10-18 NOTE — Telephone Encounter (Signed)
Sent to the pharmacy by e-scribe. 

## 2014-10-24 ENCOUNTER — Ambulatory Visit: Payer: Medicare Other | Admitting: Family Medicine

## 2014-11-17 DIAGNOSIS — Z9012 Acquired absence of left breast and nipple: Secondary | ICD-10-CM | POA: Diagnosis not present

## 2014-11-17 DIAGNOSIS — M818 Other osteoporosis without current pathological fracture: Secondary | ICD-10-CM | POA: Diagnosis not present

## 2014-11-17 DIAGNOSIS — Z170 Estrogen receptor positive status [ER+]: Secondary | ICD-10-CM | POA: Diagnosis not present

## 2014-11-17 DIAGNOSIS — I890 Lymphedema, not elsewhere classified: Secondary | ICD-10-CM | POA: Diagnosis not present

## 2014-11-17 DIAGNOSIS — C50912 Malignant neoplasm of unspecified site of left female breast: Secondary | ICD-10-CM | POA: Diagnosis not present

## 2014-11-17 DIAGNOSIS — Z853 Personal history of malignant neoplasm of breast: Secondary | ICD-10-CM | POA: Diagnosis not present

## 2014-11-17 DIAGNOSIS — Z923 Personal history of irradiation: Secondary | ICD-10-CM | POA: Diagnosis not present

## 2014-11-17 DIAGNOSIS — Z9221 Personal history of antineoplastic chemotherapy: Secondary | ICD-10-CM | POA: Diagnosis not present

## 2014-11-17 DIAGNOSIS — M810 Age-related osteoporosis without current pathological fracture: Secondary | ICD-10-CM | POA: Diagnosis not present

## 2014-11-21 ENCOUNTER — Ambulatory Visit: Payer: Medicare Other | Admitting: Internal Medicine

## 2014-11-29 ENCOUNTER — Other Ambulatory Visit: Payer: Medicare Other

## 2014-12-06 ENCOUNTER — Other Ambulatory Visit (INDEPENDENT_AMBULATORY_CARE_PROVIDER_SITE_OTHER): Payer: Medicare Other

## 2014-12-06 ENCOUNTER — Ambulatory Visit: Payer: Medicare Other | Admitting: Internal Medicine

## 2014-12-06 DIAGNOSIS — I10 Essential (primary) hypertension: Secondary | ICD-10-CM

## 2014-12-06 DIAGNOSIS — E119 Type 2 diabetes mellitus without complications: Secondary | ICD-10-CM

## 2014-12-06 LAB — BASIC METABOLIC PANEL
BUN: 13 mg/dL (ref 6–23)
CHLORIDE: 108 meq/L (ref 96–112)
CO2: 27 meq/L (ref 19–32)
CREATININE: 0.73 mg/dL (ref 0.40–1.20)
Calcium: 9.9 mg/dL (ref 8.4–10.5)
GFR: 84.35 mL/min (ref >60.00)
Glucose, Bld: 163 mg/dL — ABNORMAL HIGH (ref 70–99)
Potassium: 5.1 mEq/L (ref 3.5–5.1)
Sodium: 142 mEq/L (ref 135–145)

## 2014-12-06 LAB — HEMOGLOBIN A1C: HEMOGLOBIN A1C: 7 % — AB (ref 4.6–6.5)

## 2014-12-14 ENCOUNTER — Ambulatory Visit (INDEPENDENT_AMBULATORY_CARE_PROVIDER_SITE_OTHER): Payer: Medicare Other | Admitting: Internal Medicine

## 2014-12-14 ENCOUNTER — Encounter: Payer: Self-pay | Admitting: Internal Medicine

## 2014-12-14 VITALS — BP 110/60 | Temp 98.6°F | Ht 63.5 in | Wt 195.0 lb

## 2014-12-14 DIAGNOSIS — E119 Type 2 diabetes mellitus without complications: Secondary | ICD-10-CM

## 2014-12-14 DIAGNOSIS — J309 Allergic rhinitis, unspecified: Secondary | ICD-10-CM

## 2014-12-14 DIAGNOSIS — G4733 Obstructive sleep apnea (adult) (pediatric): Secondary | ICD-10-CM | POA: Diagnosis not present

## 2014-12-14 DIAGNOSIS — Z9989 Dependence on other enabling machines and devices: Secondary | ICD-10-CM

## 2014-12-14 MED ORDER — DOXYCYCLINE HYCLATE 100 MG PO TABS: 100.0000 mg | ORAL_TABLET | Freq: Two times a day (BID) | ORAL | Status: DC

## 2014-12-14 MED ORDER — METFORMIN HCL ER 500 MG PO TB24: ORAL_TABLET | ORAL | Status: DC

## 2014-12-14 NOTE — Progress Notes (Signed)
Pre visit review using our clinic review tool, if applicable. No additional management support is needed unless otherwise documented below in the visit note.  Chief Complaint  Patient presents with  . Follow-up    HPI: Chelsey [name] 69 y.o. comesin for Chronic disease management   DM  Holiday   Indiscretion   Taking metformin moin q d 1500 mg  No walking so far.  No sx thought numbers would not be good taking metformin  BP good  Breast cancer  Stable under care  Well since knees better planning to exercise more ROS: See pertinent positives and negatives per HPI. No sob using c pap due to see dr young  Past Medical History  Diagnosis Date  . Diabetes mellitus   . Allergy   . Osteoarthritis   . Hypertension   . Hyperlipidemia   . GERD (gastroesophageal reflux disease)   . Pneumonia 07/28/2011  . Sleep apnea     setting of 10  . Breast cancer   . Colitis   . Sinusitis   . Aphthous ulcer of mouth 2010    related to chemotherapy   . Carpal tunnel syndrome of right wrist   . Squamous cell carcinoma     left leg  . Colon polyp   . Diverticulosis   . Lymphedema 08-31-13    left arm    Family History  Problem Relation Age of Onset  . Alzheimer's disease Mother   . Sleep apnea Mother   . Diabetes Mother   . Hypertension Mother   . Hyperlipidemia Mother   . Breast cancer Mother 64    triple negative  . Anxiety disorder Daughter   . Hypertension Son     pulmonary  . Congenital heart disease Son 0    TGA  died age 84     History   Social History  . Marital Status: Married    Spouse Name: N/A  . Number of Children: 3  . 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 Smoking status: Never Smoker   . Smokeless tobacco: Never Used  . Alcohol Use: 3.6 oz/week    6 Glasses of wine per week     Comment: 1-2 glasses every other night  . Drug Use: No  . Sexual Activity: Yes    Birth Control/ Protection:  Post-menopausal, Surgical   Other Topics Concern  . 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   Passed away sept 84 15  Age 54    Outpatient Encounter Prescriptions as of 12/14/2014  Medication Sig  . ALPRAZolam (XANAX) 0.25 MG tablet take 1 tablet by mouth at bedtime if needed for anxiety  . anastrozole (ARIMIDEX) 1 MG tablet Take 1 tablet (1 mg total) by mouth daily.  . CRESTOR 10 MG tablet take 1 tablet by mouth once daily  . cycloSPORINE (RESTASIS) 0.05 % ophthalmic emulsion Place 1 drop into both eyes daily.  Marland Kitchen esomeprazole (NEXIUM) 40 MG capsule Take 40 mg by mouth daily at 12 noon. Takes bedtime  . losartan (COZAAR) 50 MG tablet TAKE 1 TABLET BY MOUTH EVERY DAY  . losartan (COZAAR) 50 MG tablet take 1 tablet by mouth once daily  . metFORMIN (GLUCOPHAGE-XR) 500  MG 24 hr tablet take 2 tablets bid  With meals  . methocarbamol (ROBAXIN) 500 MG tablet Take 1 tablet (500 mg total) by mouth every 6 (six) hours as needed for muscle spasms.  Marland Kitchen sertraline (ZOLOFT) 50 MG tablet Take 1 tablet (50 mg total) by mouth every evening.  . sertraline (ZOLOFT) 50 MG tablet take 1 tablet by mouth once daily  . traZODone (DESYREL) 50 MG tablet take 1/2 to 1 tablet at bedtime if needed for sleep  . [DISCONTINUED] metFORMIN (GLUCOPHAGE-XR) 500 MG 24 hr tablet take 2 tablets by mouth every morning WITH BREAKFAST AND 1 TABLET WITH EVENING MEAL  . [DISCONTINUED] metFORMIN (GLUMETZA) 500 MG (MOD) 24 hr tablet 1,500 mg daily. 2 tablets with breakfast (1000 mg)  and 1 tablet with dinner (500 mg) = 1500 per day  . doxycycline (VIBRA-TABS) 100 MG tablet Take 1 tablet (100 mg total) by mouth 2 (two) times daily.  . [DISCONTINUED] cefUROXime (CEFTIN) 500 MG tablet Take 1 tablet (500 mg total) by mouth 2 (two) times daily.    EXAM:  BP 110/60 mmHg  Temp(Src) 98.6 F (37 C) (Oral)  Ht 5'  3.5" (1.613 m)  Wt 195 lb (88.451 kg)  BMI 34.00 kg/m2  Body mass index is 34 kg/(m^2).  GENERAL: vitals reviewed and listed above, alert, oriented, appears well hydrated and in no acute distress extremely nasally congested  And pinnk eyes  Watery no edema HEENT: atraumatic, conjunctiva  clear, no obvious abnormalities on inspection of external nose and ears OP : no lesion edema or exudate  NECK: no obvious masses on inspection palpation  LUNGS: clear to auscultation bilaterally, no wheezes, rales or rhonchi, good air movement CV: HRRR, no clubbing cyanosis or  peripheral edema nl cap refill  MS: moves all extremities without noticeable focal  abnormality PSYCH: pleasant and cooperative, no obvious depression or anxiety Lab Results  Component Value Date   WBC 5.9 05/29/2014   HGB 13.4 05/29/2014   HCT 39.6 05/29/2014   PLT 208.0 05/29/2014   GLUCOSE 163* 12/06/2014   CHOL 179 05/29/2014   TRIG 132.0 05/29/2014   HDL 63.60 05/29/2014   LDLDIRECT 173.9 11/09/2013   LDLCALC 89 05/29/2014   ALT 22 05/29/2014   AST 19 05/29/2014   NA 142 12/06/2014   K 5.1 12/06/2014   CL 108 12/06/2014   CREATININE 0.73 12/06/2014   BUN 13 12/06/2014   CO2 27 12/06/2014   TSH 1.73 05/29/2014   INR 0.93 08/31/2013   HGBA1C 7.0* 12/06/2014   MICROALBUR 0.6 02/14/2009   BP Readings from Last 3 Encounters:  12/14/14 110/60  08/07/14 116/72  06/05/14 130/60   Wt Readings from Last 3 Encounters:  12/14/14 195 lb (88.451 kg)  08/07/14 190 lb (86.183 kg)  06/05/14 187 lb (84.823 kg)     ASSESSMENT AND PLAN:  Discussed the following assessment and plan:  Type 2 diabetes mellitus without complication - deterioration from  LS  intensify  and increase metfromin to 2000 per day   Allergic rhinitis, unspecified allergic rhinitis type - severe nasal congestion  and red itchy eyes and prob allergic sinusitis trying to avoid pred  incs and antihitstamin decongestant and add antibiotic if  needed  OSA on CPAP -Patient advised to return or notify health care team  if symptoms worsen ,persist or new concerns arise. Total visit 52mins > 50% spent counseling and coordinating care   Patient Instructions  Increase  The metfromin to  4 per day  2000 mg per day .  Intensify lifestyle interventions.. Sounds like  Allergic sinusitis  Get back on the nasacort/ flonase .  And zyrtec  Or  allegra . With  Or without decongestant .  Can add antibiotic  If needed.          Standley Brooking. Stephinie Battisti M.D.

## 2014-12-14 NOTE — Patient Instructions (Signed)
Increase  The metfromin to  4 per day  2000 mg per day .  Intensify lifestyle interventions.. Sounds like  Allergic sinusitis  Get back on the nasacort/ flonase .  And zyrtec  Or  allegra . With  Or without decongestant .  Can add antibiotic  If needed.

## 2015-02-02 ENCOUNTER — Other Ambulatory Visit: Payer: Self-pay | Admitting: Internal Medicine

## 2015-02-02 NOTE — Telephone Encounter (Signed)
Sent to the pharmacy by e-scribe.  Pt has upcoming CPX on 07/02/15

## 2015-02-06 IMAGING — CR DG CHEST 2V
2 series · 2 of 2 positions shown · non-contrast
Comparison: Chest x-ray 07/29/2011.

CLINICAL DATA: Preoperative evaluation.

CHEST - 2 VIEW

[w chest pa]
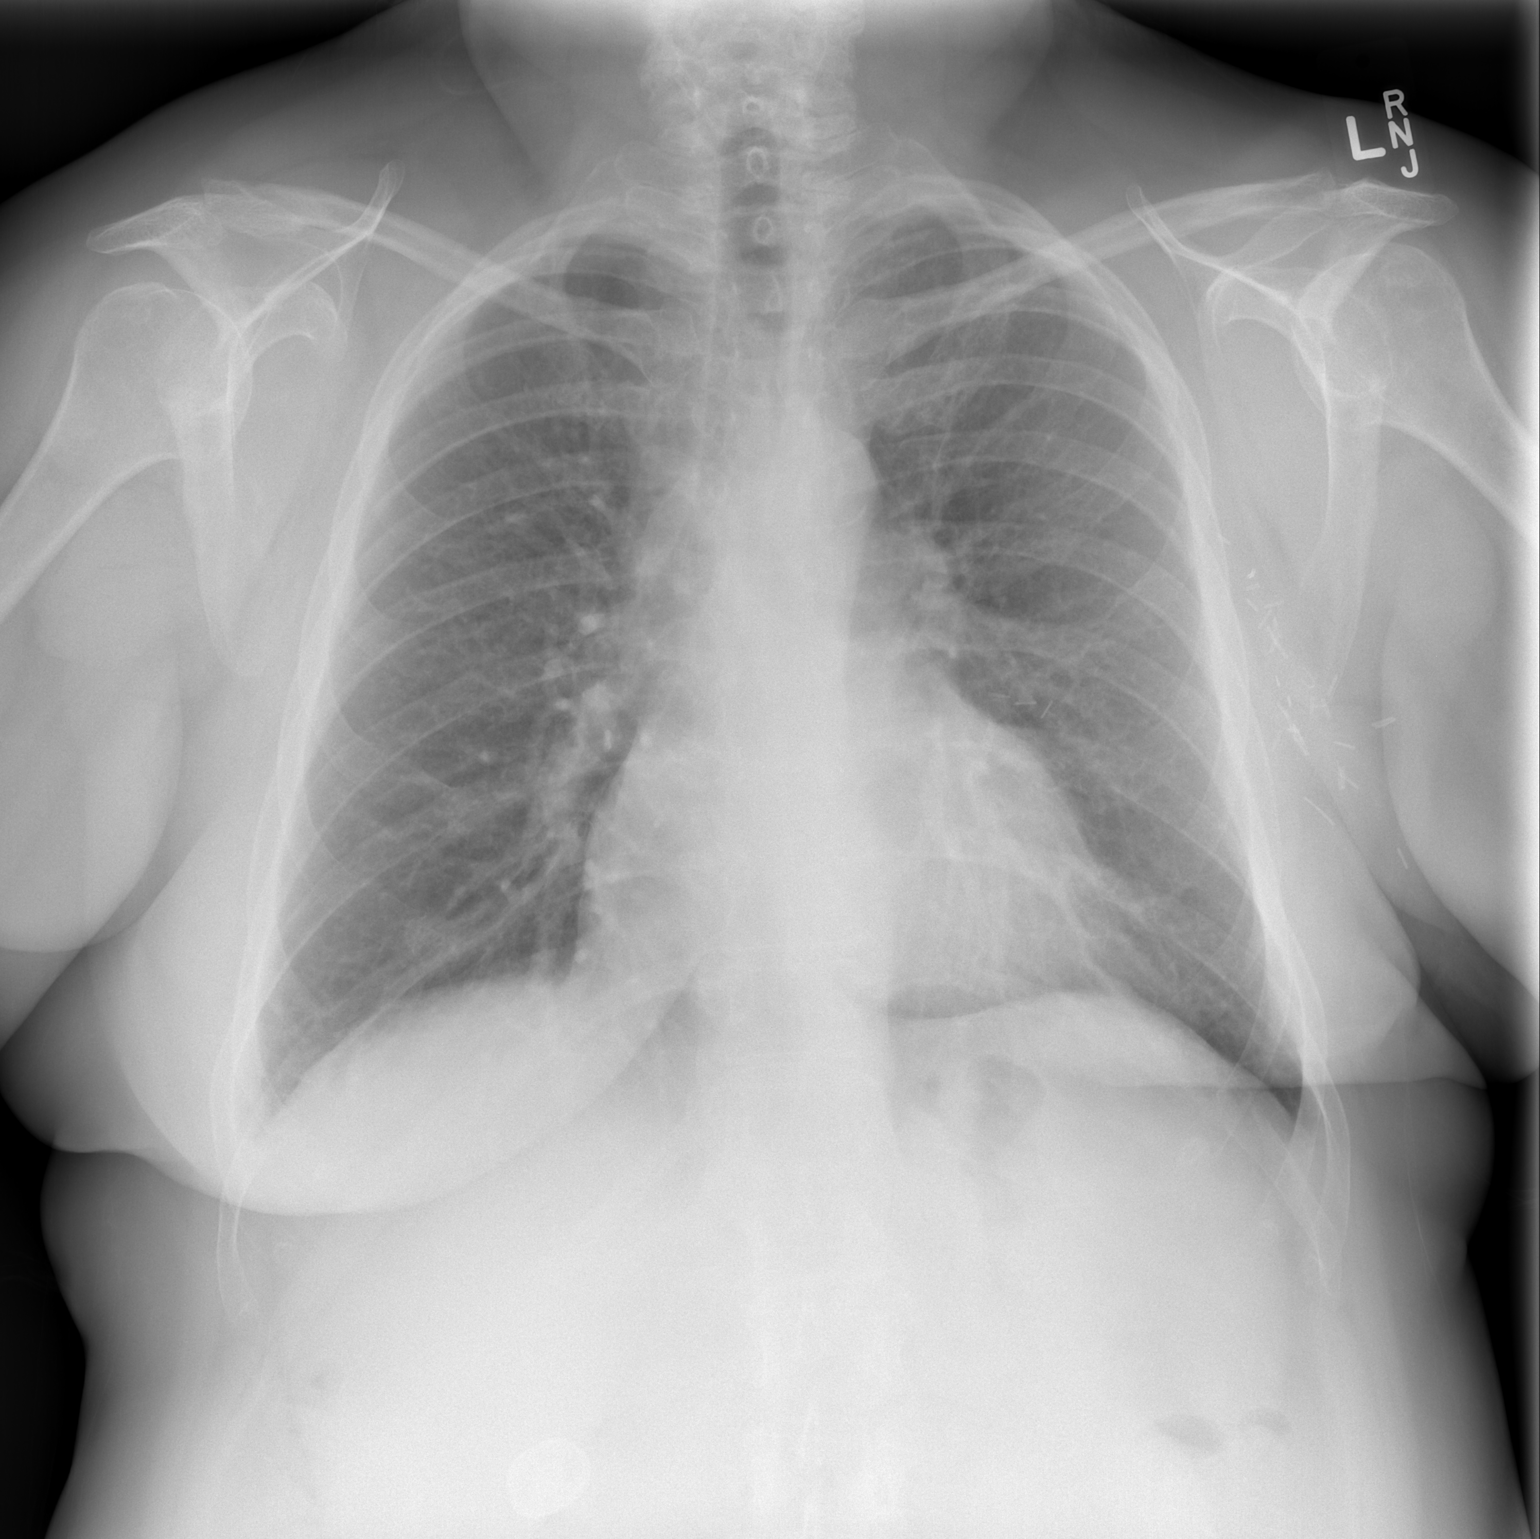

[w chest lat]
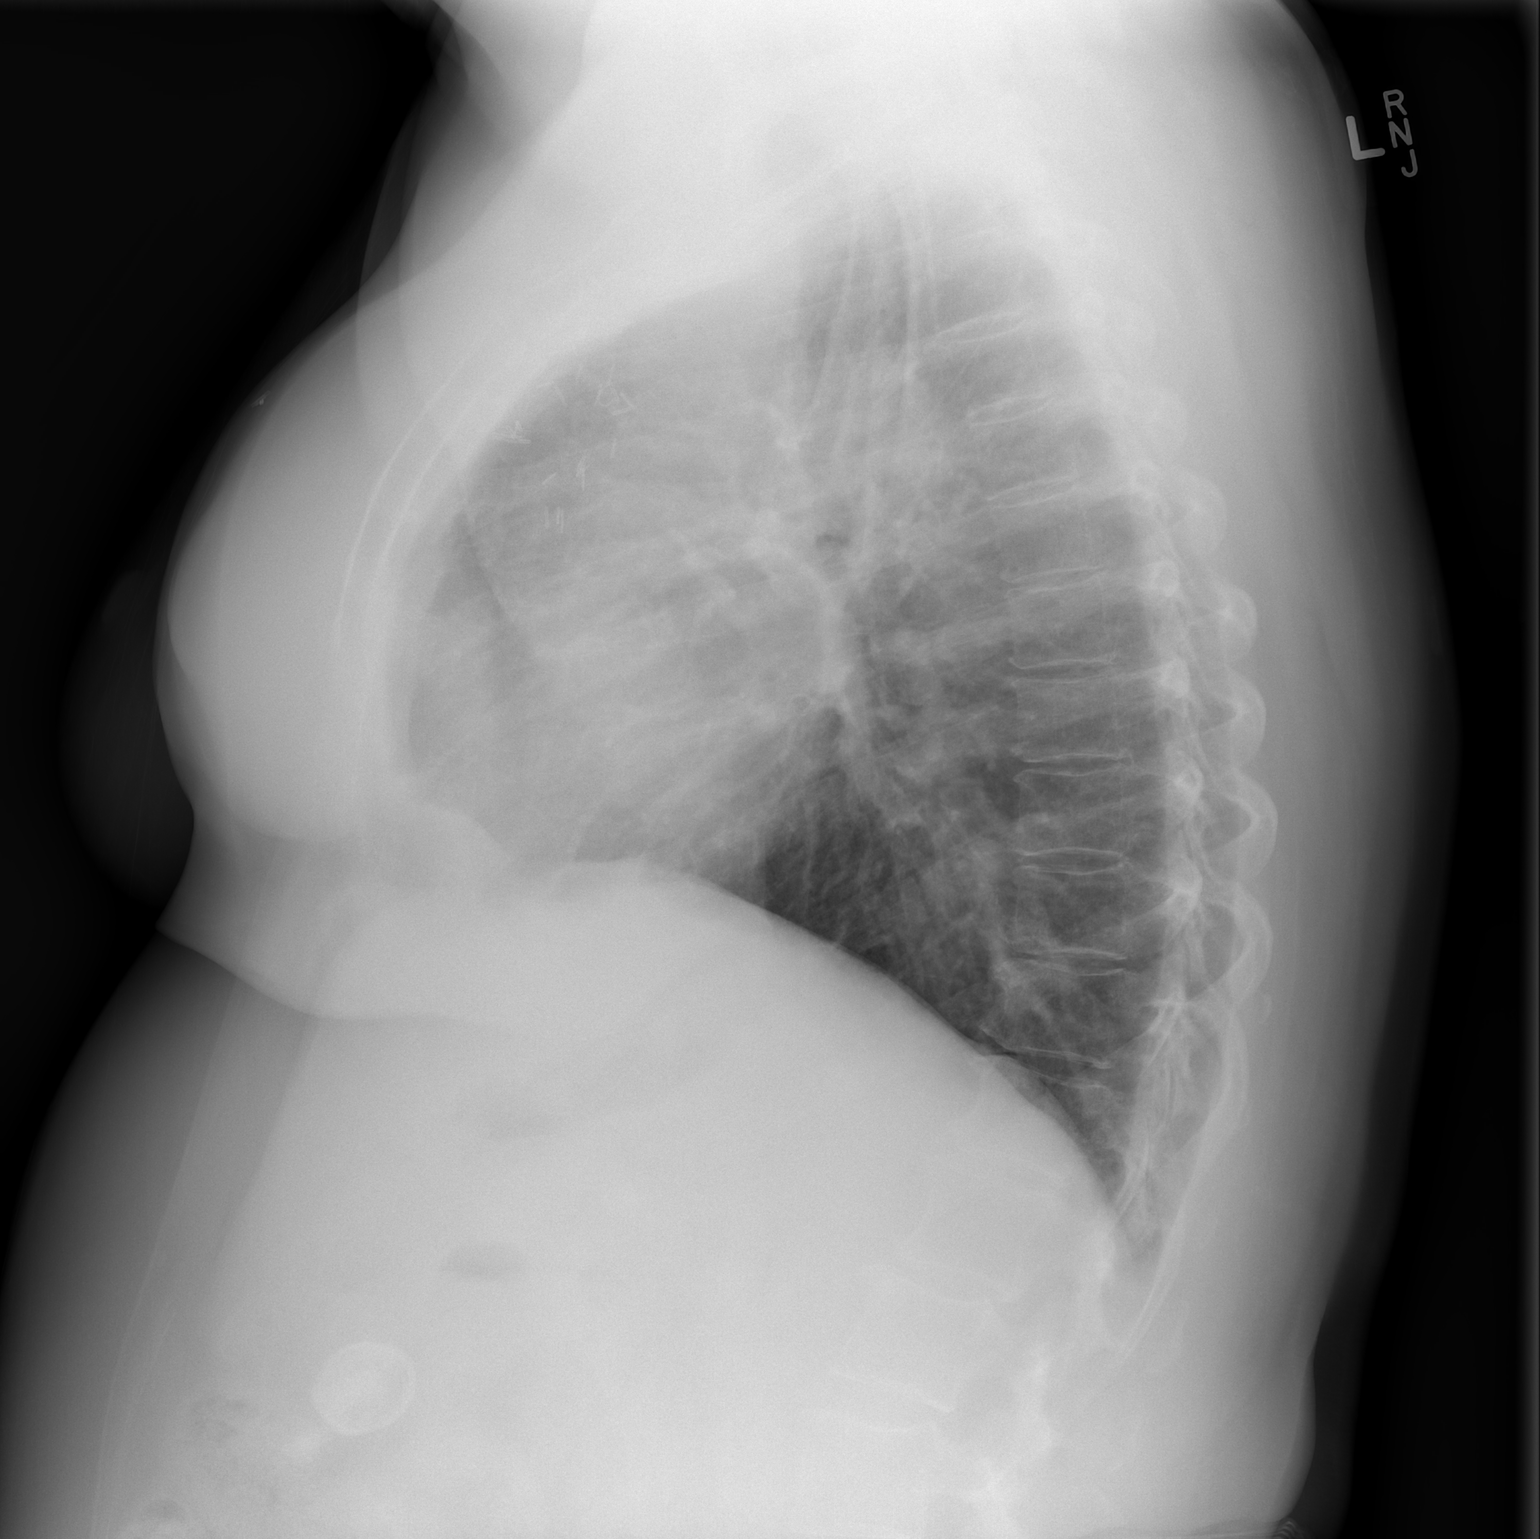

[2 of 2 positions shown; findings below may reference images not displayed]

FINDINGS: Lung volumes are normal.  No consolidative airspace
disease.  No pleural effusions.  No pneumothorax.  No pulmonary
nodule or mass noted.  Pulmonary vasculature and the
cardiomediastinal silhouette are within normal limits.
Atherosclerosis in the thoracic aorta.  Surgical clips project over
the left axillary region, presumably from prior lymph node
dissection.  Large rim calcified structure projecting over the
right upper quadrant of the abdomen is most compatible with a
calcified gallstone.
IMPRESSION: 1.  No radiographic evidence of acute cardiopulmonary disease.
2.  Cholelithiasis.
3.  Atherosclerosis.

## 2015-02-13 ENCOUNTER — Other Ambulatory Visit: Payer: Self-pay | Admitting: Internal Medicine

## 2015-02-15 ENCOUNTER — Encounter: Payer: Self-pay | Admitting: Internal Medicine

## 2015-02-15 ENCOUNTER — Ambulatory Visit (INDEPENDENT_AMBULATORY_CARE_PROVIDER_SITE_OTHER): Payer: Medicare Other | Admitting: Internal Medicine

## 2015-02-15 VITALS — BP 122/72 | HR 62 | Ht 64.0 in | Wt 196.0 lb

## 2015-02-15 DIAGNOSIS — Z9989 Dependence on other enabling machines and devices: Principal | ICD-10-CM

## 2015-02-15 DIAGNOSIS — J310 Chronic rhinitis: Secondary | ICD-10-CM

## 2015-02-15 DIAGNOSIS — G4733 Obstructive sleep apnea (adult) (pediatric): Secondary | ICD-10-CM | POA: Diagnosis not present

## 2015-02-15 NOTE — Patient Instructions (Addendum)
Consider Sudafed decongestant (pharmacy counter) just before your plane flight, and chewing gum while landing  We can continue CPAP 11/ Advanced. Ask them from time to time if they have any new masks to look at.  Sample Dymista nasal spray  1-2 puffs each nostril once or twice daily while needed  Consider nasal saline rinse when you feel a cold or sinus infection coming

## 2015-02-15 NOTE — Progress Notes (Signed)
10/14/12-- 54 yoF never smoker here with husband. Hx OSA/ CPAP, complicated by history breast cancer, hypertension, allergic rhinitis           Here with husband follows for : last seen in 2010. pt wears mask 7 days week approx 8 hours . denies any mask or pressure issues.  NPSG 47.7/ hr Wearing CPAP 11/Advanced all night every night and says she can't sleep without it. She only started wearing CPAP last year after recovering from breast cancer surgery and reconstruction.  Now pending TKR  11/21/13- 66 yoF never smoker here with husband. Hx OSA/ CPAP, complicated by history breast cancer, hypertension, allergic rhinitis           Here with husband Wears cpap 11/ Advanced nightly, 7.5-8 hours nighlty..  Has problems wearing mask, adjusting constantly through the night.  states it slides and doesn't fit well.  Nose gets stuffy.  02/15/15- 35 yoF never smoker here with husband. Hx OSA/ CPAP, complicated by history breast cancer, hypertension, allergic rhinitis           Here with husband  cpap 11/ Advanced nightly-FOLLOWS FOR: Wears CPAP for about 7-8 hours nightly; DME is [facility]. No supplies needed at this time. She likes her nasal pillows mask better than previous designs. Noting increased postnasal drainage and stuffiness but feels allergic rhinitis and ear discomforts are improved.  ROS-see HPI Constitutional:   No-   weight loss, night sweats, fevers, chills, fatigue, lassitude. HEENT:   No-  headaches, difficulty swallowing, tooth/dental problems, sore throat,       No-  sneezing, itching, ear ache, +nasal congestion, +post nasal drip,  CV:  No-   chest pain, orthopnea, PND, swelling in lower extremities, anasarca, dizziness, palpitations Resp: No-   shortness of breath with exertion or at rest.              No-   productive cough,  No non-productive cough,  No- coughing up of blood.              No-   change in color of mucus.  No- wheezing.   Skin: No-   rash or lesions. GI:  No-   heartburn,  indigestion, abdominal pain, nausea, vomiting,  GU:  MS:  + joint pain or swelling.   Neuro-     nothing unusual Psych:  No- change in mood or affect. No depression or anxiety.  No memory loss.  OBJ- Physical Exam General- Alert, Oriented, Affect-appropriate, Distress- none acute Skin- rash-none, lesions- none, excoriation- none Lymphadenopathy- none Head- atraumatic            Eyes- Gross vision intact, PERRLA, conjunctivae and secretions clear            Ears- Hearing, canals-normal            Nose- + prominent turbinates, Septal dev +mild, mucus, polyps, erosion, perforation             Throat- Mallampati III-IV, mucosa clear , drainage- none, tonsils- atrophic, own teeth Neck- flexible , trachea midline, no stridor , thyroid nl, carotid no bruit Chest - symmetrical excursion , unlabored           Heart/CV- RRR , no murmur , no gallop  , no rub, nl s1 s2                           - JVD- none , edema- none, stasis changes- none, varices- none  Lung- clear to P&A, wheeze- none, cough- none , dullness-none, rub- none           Chest wall-  Abd-  Br/ Gen/ Rectal- Not done, not indicated Extrem- cyanosis- none, clubbing, none, atrophy- none, strength- nl Neuro- grossly intact to observation

## 2015-03-11 NOTE — Assessment & Plan Note (Signed)
CPAP pressure and compliance are good. Plan-continue CPAP 11/Advanced. Comfort issues discussed.

## 2015-03-11 NOTE — Assessment & Plan Note (Signed)
She has complaints of nasal stuffiness and postnasal drainage in the morning after wearing CPAP at night. This is similar to her husband's pattern with his own CPAP. We discussed humidity and air-quality issues. I suspect this is a vasomotor rhinitis. Plan-compare response to sample Dymista with nasal saline spray or gel. Adjust humidifier.

## 2015-04-18 ENCOUNTER — Encounter: Payer: Self-pay | Admitting: Internal Medicine

## 2015-05-22 DIAGNOSIS — N952 Postmenopausal atrophic vaginitis: Secondary | ICD-10-CM | POA: Diagnosis not present

## 2015-05-22 DIAGNOSIS — Z9012 Acquired absence of left breast and nipple: Secondary | ICD-10-CM | POA: Diagnosis not present

## 2015-05-22 DIAGNOSIS — M810 Age-related osteoporosis without current pathological fracture: Secondary | ICD-10-CM | POA: Diagnosis not present

## 2015-05-22 DIAGNOSIS — Z170 Estrogen receptor positive status [ER+]: Secondary | ICD-10-CM | POA: Diagnosis not present

## 2015-05-22 DIAGNOSIS — Z853 Personal history of malignant neoplasm of breast: Secondary | ICD-10-CM | POA: Diagnosis not present

## 2015-05-22 DIAGNOSIS — I10 Essential (primary) hypertension: Secondary | ICD-10-CM | POA: Diagnosis not present

## 2015-05-22 DIAGNOSIS — M818 Other osteoporosis without current pathological fracture: Secondary | ICD-10-CM | POA: Diagnosis not present

## 2015-05-22 DIAGNOSIS — C50912 Malignant neoplasm of unspecified site of left female breast: Secondary | ICD-10-CM | POA: Diagnosis not present

## 2015-05-22 DIAGNOSIS — Z79811 Long term (current) use of aromatase inhibitors: Secondary | ICD-10-CM | POA: Diagnosis not present

## 2015-05-22 DIAGNOSIS — T386X5A Adverse effect of antigonadotrophins, antiestrogens, antiandrogens, not elsewhere classified, initial encounter: Secondary | ICD-10-CM | POA: Diagnosis not present

## 2015-05-22 DIAGNOSIS — Z923 Personal history of irradiation: Secondary | ICD-10-CM | POA: Diagnosis not present

## 2015-05-28 DIAGNOSIS — H40013 Open angle with borderline findings, low risk, bilateral: Secondary | ICD-10-CM | POA: Diagnosis not present

## 2015-05-28 DIAGNOSIS — H25813 Combined forms of age-related cataract, bilateral: Secondary | ICD-10-CM | POA: Diagnosis not present

## 2015-05-28 DIAGNOSIS — E119 Type 2 diabetes mellitus without complications: Secondary | ICD-10-CM | POA: Diagnosis not present

## 2015-05-30 ENCOUNTER — Other Ambulatory Visit: Payer: Medicare Other

## 2015-06-05 DIAGNOSIS — M818 Other osteoporosis without current pathological fracture: Secondary | ICD-10-CM | POA: Diagnosis not present

## 2015-06-05 DIAGNOSIS — Z853 Personal history of malignant neoplasm of breast: Secondary | ICD-10-CM | POA: Diagnosis not present

## 2015-06-05 DIAGNOSIS — Z79811 Long term (current) use of aromatase inhibitors: Secondary | ICD-10-CM | POA: Diagnosis not present

## 2015-06-05 DIAGNOSIS — M810 Age-related osteoporosis without current pathological fracture: Secondary | ICD-10-CM | POA: Diagnosis not present

## 2015-06-05 DIAGNOSIS — Z170 Estrogen receptor positive status [ER+]: Secondary | ICD-10-CM | POA: Diagnosis not present

## 2015-06-05 DIAGNOSIS — I10 Essential (primary) hypertension: Secondary | ICD-10-CM | POA: Diagnosis not present

## 2015-06-05 DIAGNOSIS — T386X5A Adverse effect of antigonadotrophins, antiestrogens, antiandrogens, not elsewhere classified, initial encounter: Secondary | ICD-10-CM | POA: Diagnosis not present

## 2015-06-05 DIAGNOSIS — N952 Postmenopausal atrophic vaginitis: Secondary | ICD-10-CM | POA: Diagnosis not present

## 2015-06-11 DIAGNOSIS — H40013 Open angle with borderline findings, low risk, bilateral: Secondary | ICD-10-CM | POA: Diagnosis not present

## 2015-06-11 DIAGNOSIS — E119 Type 2 diabetes mellitus without complications: Secondary | ICD-10-CM | POA: Diagnosis not present

## 2015-06-19 ENCOUNTER — Other Ambulatory Visit: Payer: Self-pay | Admitting: Internal Medicine

## 2015-06-21 ENCOUNTER — Other Ambulatory Visit: Payer: Self-pay | Admitting: *Deleted

## 2015-06-21 NOTE — Telephone Encounter (Signed)
Ok to refill x 6 months  She is on for  appt  9 [phone]

## 2015-06-22 NOTE — Telephone Encounter (Signed)
Sent to the pharmacy by e-scribe. 

## 2015-07-02 ENCOUNTER — Encounter: Payer: Self-pay | Admitting: Internal Medicine

## 2015-07-02 ENCOUNTER — Ambulatory Visit (INDEPENDENT_AMBULATORY_CARE_PROVIDER_SITE_OTHER): Payer: Medicare Other | Admitting: Internal Medicine

## 2015-07-02 VITALS — BP 120/60 | Temp 99.0°F | Ht 63.0 in | Wt 194.9 lb

## 2015-07-02 DIAGNOSIS — Z23 Encounter for immunization: Secondary | ICD-10-CM | POA: Diagnosis not present

## 2015-07-02 DIAGNOSIS — E785 Hyperlipidemia, unspecified: Secondary | ICD-10-CM | POA: Diagnosis not present

## 2015-07-02 DIAGNOSIS — G4733 Obstructive sleep apnea (adult) (pediatric): Secondary | ICD-10-CM | POA: Diagnosis not present

## 2015-07-02 DIAGNOSIS — Z853 Personal history of malignant neoplasm of breast: Secondary | ICD-10-CM

## 2015-07-02 DIAGNOSIS — Z0000 Encounter for general adult medical examination without abnormal findings: Secondary | ICD-10-CM

## 2015-07-02 DIAGNOSIS — K5900 Constipation, unspecified: Secondary | ICD-10-CM | POA: Diagnosis not present

## 2015-07-02 DIAGNOSIS — Z9989 Dependence on other enabling machines and devices: Secondary | ICD-10-CM

## 2015-07-02 DIAGNOSIS — E119 Type 2 diabetes mellitus without complications: Secondary | ICD-10-CM

## 2015-07-02 DIAGNOSIS — Z79899 Other long term (current) drug therapy: Secondary | ICD-10-CM

## 2015-07-02 DIAGNOSIS — I10 Essential (primary) hypertension: Secondary | ICD-10-CM

## 2015-07-02 DIAGNOSIS — K219 Gastro-esophageal reflux disease without esophagitis: Secondary | ICD-10-CM

## 2015-07-02 LAB — CBC WITH DIFFERENTIAL/PLATELET
BASOS ABS: 0 10*3/uL (ref 0.0–0.1)
Basophils Relative: 0.7 % (ref 0.0–3.0)
EOS ABS: 0.3 10*3/uL (ref 0.0–0.7)
Eosinophils Relative: 4.6 % (ref 0.0–5.0)
HEMATOCRIT: 40.2 % (ref 36.0–46.0)
HEMOGLOBIN: 13.7 g/dL (ref 12.0–15.0)
LYMPHS PCT: 31 % (ref 12.0–46.0)
Lymphs Abs: 1.9 10*3/uL (ref 0.7–4.0)
MCHC: 34 g/dL (ref 30.0–36.0)
MCV: 90.7 fl (ref 78.0–100.0)
Monocytes Absolute: 0.5 10*3/uL (ref 0.1–1.0)
Monocytes Relative: 7.9 % (ref 3.0–12.0)
Neutro Abs: 3.4 10*3/uL (ref 1.4–7.7)
Neutrophils Relative %: 55.8 % (ref 43.0–77.0)
PLATELETS: 210 10*3/uL (ref 150.0–400.0)
RBC: 4.43 Mil/uL (ref 3.87–5.11)
RDW: 13.3 % (ref 11.5–15.5)
WBC: 6.2 10*3/uL (ref 4.0–10.5)

## 2015-07-02 LAB — LIPID PANEL
CHOLESTEROL: 158 mg/dL (ref 0–200)
HDL: 61.6 mg/dL (ref >39.00)
LDL Cholesterol: 73 mg/dL (ref 0–99)
NonHDL: 96.47
TRIGLYCERIDES: 115 mg/dL (ref 0.0–149.0)
Total CHOL/HDL Ratio: 3
VLDL: 23 mg/dL (ref 0.0–40.0)

## 2015-07-02 LAB — HEMOGLOBIN A1C: HEMOGLOBIN A1C: 6.8 % — AB (ref 4.6–6.5)

## 2015-07-02 LAB — BASIC METABOLIC PANEL
BUN: 12 mg/dL (ref 6–23)
CALCIUM: 9.7 mg/dL (ref 8.4–10.5)
CO2: 27 mEq/L (ref 19–32)
Chloride: 105 mEq/L (ref 96–112)
Creatinine, Ser: 0.66 mg/dL (ref 0.40–1.20)
GFR: 94.6 mL/min (ref >60.00)
Glucose, Bld: 150 mg/dL — ABNORMAL HIGH (ref 70–99)
Potassium: 4.8 mEq/L (ref 3.5–5.1)
Sodium: 140 mEq/L (ref 135–145)

## 2015-07-02 LAB — HEPATIC FUNCTION PANEL
ALBUMIN: 4.3 g/dL (ref 3.5–5.2)
ALT: 19 U/L (ref 0–35)
AST: 19 U/L (ref 0–37)
Alkaline Phosphatase: 59 U/L (ref 39–117)
Bilirubin, Direct: 0.1 mg/dL (ref 0.0–0.3)
Total Bilirubin: 0.5 mg/dL (ref 0.2–1.2)
Total Protein: 6.7 g/dL (ref 6.0–8.3)

## 2015-07-02 LAB — MICROALBUMIN / CREATININE URINE RATIO
Creatinine,U: 156 mg/dL
MICROALB UR: 1.9 mg/dL (ref 0.0–1.9)
Microalb Creat Ratio: 1.2 mg/g (ref 0.0–30.0)

## 2015-07-02 LAB — TSH: TSH: 1.57 u[IU]/mL (ref 0.35–4.50)

## 2015-07-02 NOTE — Progress Notes (Signed)
Pre visit review using our clinic review tool, if applicable. No additional management support is needed unless otherwise documented below in the visit note.  Chief Complaint  Patient presents with  . Medicare Wellness    meds  . Diabetes  . Hypertension    HPI: Chelsey [name] 69 y.o. comes in today for Preventive Medicare wellness visit and CDM  .Since last visit.  [employer] retired .  GYNE.  [place].  Breast surgeon. Fu for her breast cancer  No new developments  But Now on prolia    .  OSA :   Doing ok.  On cpap  Groat :  cataract.    Lf  Floaters  To have  Out sometime.  Ortho:  Doing well.  Can go up steps  Now after joint replacments  DM: no eye change otherwise new numbness infection low bgs  Still working  Full time  Bp has been in control  Knows need to control weight  Mood stable  Better   Rare ot ocass use alprazalam  Health Maintenance  Topic Date Due  . Hepatitis C Screening  [birth year]  . OPHTHALMOLOGY EXAM  05/25/2015  . FOOT EXAM  06/06/2015  . ZOSTAVAX  07/01/2016 (Originally 04/17/2007)  . PNA vac Low Risk Adult (2 of 2 - PPSV23) 07/01/2016 (Originally 11/24/2014)  . HEMOGLOBIN A1C  12/30/2015  . INFLUENZA VACCINE  05/13/2016  . MAMMOGRAM  08/29/2016  . TETANUS/TDAP  10/03/2020  . COLONOSCOPY  08/18/2023  . DEXA SCAN  Completed   Health Maintenance Review LIFESTYLE:  TADneg tob d pos etoh 1-2 per day at most  Sugar beverages: Sleep:on cpap MEDICARE DOCUMENT QUESTIONS  TO SCAN   Hearing: ok  Vision:  No limitations at present . Last eye check UTD  Safety:  Has smoke detector and wears seat belts.  No firearms. No excess sun exposure. Sees dentist regularly.  Falls: no  Advance directive :  Reviewed  Doesn't have one  Given advice   Memory: Felt to be good  , no concern from her or her family.  Depression: No anhedonia unusual crying or depressive symptoms  Nutrition: Eats well balanced diet; adequate calcium and vitamin D. No swallowing chewing  problems.  Injury: no major injuries in the last six months.  Other healthcare providers:  Reviewed today .  Social:  Lives with spouse married.  alos daughter runs real estate business   Preventive parameters: up-to-date  Reviewed   ADLS:   There are no problems or need for assistance  driving, feeding, obtaining food, dressing, toileting and bathing, managing money using phone. She is independent.    ROS: as per hpi bloating at times  Gas poss constipation  No real pain   No blood GEN/ HEENT: No fever, significant weight changes sweats headaches vision problems hearing changes, CV/ PULM; No chest pain shortness of breath cough, syncope,edema  change in exercise tolerance. GI /GU: No adominal pain, vomiting, change in bowel habits. No blood in the stool. No significant GU symptoms. SKIN/HEME: ,no acute skin rashes suspicious lesions or bleeding. No lymphadenopathy, nodules, masses.  NEURO/ PSYCH:  No neurologic signs such as weakness numbness. No depression anxiety. IMM/ Allergy: No unusual infections.  Allergy .   REST of 12 system review negative except as per HPI   Past Medical History  Diagnosis Date  . Diabetes mellitus   . Allergy   . Osteoarthritis   . Hypertension   . Hyperlipidemia   . GERD (gastroesophageal reflux  disease)   . Pneumonia 07/28/2011  . Sleep apnea     setting of 10  . Breast cancer   . Colitis   . Sinusitis   . Aphthous ulcer of mouth 2010    related to chemotherapy   . Carpal tunnel syndrome of right wrist   . Squamous cell carcinoma     left leg  . Colon polyp   . Diverticulosis   . Lymphedema 08-31-13    left arm    Family History  Problem Relation Age of Onset  . Alzheimer's disease Mother   . Sleep apnea Mother   . Diabetes Mother   . Hypertension Mother   . Hyperlipidemia Mother   . Breast cancer Mother 46    triple negative  . Anxiety disorder Daughter   . Hypertension Son     pulmonary  . Congenital heart disease Son 0     TGA  died age 71     Social History   Social History  . Marital Status: Married    Spouse Name: N/A  . Number of Children: 3  . 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 Smoking status: Never Smoker   . Smokeless tobacco: Never Used  . Alcohol Use: 3.6 oz/week    6 Glasses of wine per week     Comment: 1-2 glasses every other night  . Drug Use: No  . Sexual Activity: Yes    Birth Control/ Protection: Post-menopausal, Surgical   Other Topics Concern  . 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   Passed away sept 61 15  Age 67    Outpatient Encounter Prescriptions as of 07/02/2015  Medication Sig  . ALPRAZolam (XANAX) 0.25 MG tablet take 1 tablet by mouth at bedtime if needed for anxiety  . anastrozole (ARIMIDEX) 1 MG tablet Take 1 tablet (1 mg total) by mouth daily.  . CRESTOR 10 MG tablet take 1 tablet by mouth once daily  . cycloSPORINE (RESTASIS) 0.05 % ophthalmic emulsion Place 1 drop into both eyes daily.  Marland Kitchen denosumab (PROLIA) 60 MG/ML SOLN injection Inject 60 mg into the skin every 6 (six) months. Administer in upper arm, thigh, or abdomen  . esomeprazole (NEXIUM) 40 MG capsule Take 40 mg by mouth daily at 12 noon. Takes bedtime  . losartan (COZAAR) 50 MG tablet take 1 tablet by mouth once daily  . metFORMIN (GLUCOPHAGE-XR) 500 MG 24 hr tablet take 2 tablets bid  With meals (Patient taking differently: Take 2 tablets po every morning and 1 tablet po every evening)  . sertraline (ZOLOFT) 50 MG tablet Take 1 tablet (50 mg total) by mouth every evening. (Patient taking differently: Take 25 mg by mouth every evening. )  . traZODone (DESYREL) 50 MG tablet take 1/2 to 1 tablet by mouth at bedtime if needed for sleep   No facility-administered encounter medications on file as of 07/02/2015.     EXAM:  BP 120/60 mmHg  Temp(Src) 99 F (37.2 C) (Oral)  Ht 5\' 3"  (1.6 m)  Wt 194 lb 14.4 oz (88.406 kg)  BMI 34.53 kg/m2  Body mass index is 34.53 kg/(m^2).  Physical Exam: Vital signs reviewed WCH:ENID is a well-developed well-nourished alert cooperative  who appears stated age in no acute distress.  HEENT: normocephalic atraumatic , Eyes: PERRL EOM's full, conjunctiva clear, Nares: paten,t no deformity discharge or tenderness., Ears: no deformity EAC's clear TMs with normal landmarks. Mouth: clear OP, no lesions, edema.  Moist mucous membranes. Dentition in adequate repair. NECK: supple without masses, thyromegaly or bruits. CHEST/PULM:  Clear to auscultation and percussion breath sounds equal no wheeze , rales or rhonchi. No chest wall deformities or tenderness. CV: PMI is nondisplaced, S1 S2 no gallops, murmurs, rubs. Peripheral pulses are full without delay.No JVD .  ABDOMEN: Bowel sounds normal nontender  No guard or rebound, no hepato splenomegal no CVA tenderness.   Extremtities:  No clubbing cyanosis or edema, no acute joint swelling or redness no focal atrophy well healed  Knee scars  NEURO:  Oriented x3, cranial nerves 3-12 appear to be intact, no obvious focal weakness,gait within normal limits no abnormal reflexes or asymmetrical SKIN: No acute rashes normal turgor, color, no bruising or petechiae. PSYCH: Oriented, good eye contact, no obvious depression anxiety, cognition and judgment appear normal. LN: no cervical axillary inguinal adenopathy No noted deficits in memory, attention, and speech Diabetic Foot Exam - Simple   Simple Foot Form  Diabetic Foot exam was performed with the following findings:  Yes 07/02/2015 10:40 AM  Visual Inspection  No deformities, no ulcerations, no other skin breakdown bilaterally:  Yes  Sensation Testing  Intact to touch and monofilament testing bilaterally:  Yes  Pulse Check  Posterior Tibialis and Dorsalis pulse intact  bilaterally:  Yes  Comments         ASSESSMENT AND PLAN:  Discussed the following assessment and plan:  Medicare annual wellness visit, subsequent  Type 2 diabetes mellitus without complication - due for labs same meds for now - Plan: Hepatic function panel, Basic metabolic panel, Hemoglobin A1c, Lipid panel, TSH, Microalbumin / creatinine urine ratio, CBC with Differential/Platelet  Constipation, unspecified constipation type - off an on [poss diet calcium related not all the time  check tsh follow  - Plan: Hepatic function panel, Basic metabolic panel, Hemoglobin A1c, Lipid panel, TSH, Microalbumin / creatinine urine ratio, CBC with Differential/Platelet  OSA on CPAP - Plan: Hepatic function panel, Basic metabolic panel, Hemoglobin A1c, Lipid panel, TSH, Microalbumin / creatinine urine ratio, CBC with Differential/Platelet  Medication management - Plan: Hepatic function panel, Basic metabolic panel, Hemoglobin A1c, Lipid panel, TSH, Microalbumin / creatinine urine ratio, CBC with Differential/Platelet  Gastroesophageal reflux disease, esophagitis presence not specified - on meds stable   - Plan: Hepatic function panel, Basic metabolic panel, Hemoglobin A1c, Lipid panel, TSH, Microalbumin / creatinine urine ratio, CBC with Differential/Platelet  Hyperlipidemia - Plan: Hepatic function panel, Basic metabolic panel, Hemoglobin A1c, Lipid panel, TSH, Microalbumin / creatinine urine ratio, CBC with Differential/Platelet  Essential hypertension  HX: breast cancer  Need for prophylactic vaccination and inoculation against influenza - Plan: Flu vaccine HIGH DOSE PF (Fluzone High dose) Diabetes Health Maintenance Due  Topic Date Due  . OPHTHALMOLOGY EXAM  05/25/2015  . FOOT EXAM  06/06/2015  . HEMOGLOBIN A1C  12/30/2015    Patient Care Team: Burnis Medin, MD as PCP - General Gaynelle Arabian, MD as Attending Physician (Orthopedic Surgery) Lafayette Dragon, MD as Consulting Physician  (Gastroenterology) Harvie Heck, MD as Physician Assistant (Internal Medicine) Deneise Lever, MD as Consulting Physician (Pulmonary Disease) Clent Jacks, MD as Consulting Physician (Ophthalmology)  Patient Instructions   Will notify you  of labs when available. Healthy lifestyle  includes : At least 150 minutes of exercise weeks  , weight at healthy levels, which is usually   BMI 19-25. Avoid trans fats and processed foods;  Increase fresh fruits and veges to 5 servings per day. And avoid sweet beverages including tea and juice. Mediterranean diet with olive oil and nuts have been noted to be heart and brain healthy . Avoid tobacco products . Limit  alcohol to  7 per week for women and 14 servings for men.  Get adequate sleep . Wear seat belts . Don't text and drive .   Advanced directive  Checking  Make sure    We get copies of labs done elsewhere. If all ok then plan labs in 6 months and rov  .  Try ranitidine instead of tums that can cause constipation.      Why follow it? Research shows. . Those who follow the Mediterranean diet have a reduced risk of heart disease  . The diet is associated with a reduced incidence of Parkinson's and Alzheimer's diseases . People following the diet may have longer life expectancies and lower rates of chronic diseases  . The Dietary Guidelines for Americans recommends the Mediterranean diet as an eating plan to promote health and prevent disease  What Is the Mediterranean Diet?  . Healthy eating plan based on typical foods and recipes of Mediterranean-style cooking . The diet is primarily a plant based diet; these foods should make up a majority of meals   Starches - Plant based foods should make up a majority of meals - They are an important sources of vitamins, minerals, energy, antioxidants, and fiber - Choose whole grains, foods high in fiber and minimally processed items  - Typical grain sources include wheat, oats, barley, corn, brown  rice, bulgar, farro, millet, polenta, couscous  - Various types of beans include chickpeas, lentils, fava beans, black beans, white beans   Fruits  Veggies - Large quantities of antioxidant rich fruits & veggies; 6 or more servings  - Vegetables can be eaten raw or lightly drizzled with oil and cooked  - Vegetables common to the traditional Mediterranean Diet include: artichokes, arugula, beets, broccoli, brussel sprouts, cabbage, carrots, celery, collard greens, cucumbers, eggplant, kale, leeks, lemons, lettuce, mushrooms, okra, onions, peas, peppers, potatoes, pumpkin, radishes, rutabaga, shallots, spinach, sweet potatoes, turnips, zucchini - Fruits common to the Mediterranean Diet include: apples, apricots, avocados, cherries, clementines, dates, figs, grapefruits, grapes, melons, nectarines, oranges, peaches, pears, pomegranates, strawberries, tangerines  Fats - Replace butter and margarine with healthy oils, such as olive oil, canola oil, and tahini  - Limit nuts to no more than a handful a day  - Nuts include walnuts, almonds, pecans, pistachios, pine nuts  - Limit or avoid candied, honey roasted or heavily salted nuts - Olives are central to the [employer] - can be eaten whole or used in a variety of dishes   Meats Protein - Limiting red meat: no more than a few times a month - When eating red meat: choose lean cuts and keep the portion to the size of deck of cards - Eggs: approx. 0 to 4 times a week  - Fish and lean poultry: at least 2 a week  - Healthy protein sources include, chicken, [place], lean beef, lamb - Increase intake of seafood such as tuna, salmon, trout, mackerel, shrimp, scallops - Avoid or limit high fat processed meats such as sausage and bacon  Dairy - Include moderate amounts of low fat dairy products  -  Focus on healthy dairy such as fat free yogurt, skim milk, low or reduced fat cheese - Limit dairy products higher in fat such as whole or 2% milk, cheese, ice  cream  Alcohol - Moderate amounts of red wine is ok  - No more than 5 oz daily for women (all ages) and men older than age 80  - No more than 10 oz of wine daily for men younger than 79  Other - Limit sweets and other desserts  - Use herbs and spices instead of salt to flavor foods  - Herbs and spices common to the traditional Mediterranean Diet include: basil, bay leaves, chives, cloves, cumin, fennel, garlic, lavender, marjoram, mint, oregano, parsley, pepper, rosemary, sage, savory, sumac, tarragon, thyme   It's not just a diet, it's a lifestyle:  . The Mediterranean diet includes lifestyle factors typical of those in the region  . Foods, drinks and meals are best eaten with others and savored . Daily physical activity is important for overall good health . This could be strenuous exercise like running and aerobics . This could also be more leisurely activities such as walking, housework, yard-work, or taking the stairs . Moderation is the key; a balanced and healthy diet accommodates most foods and drinks . Consider portion sizes and frequency of consumption of certain foods   Meal Ideas & Options:  . Breakfast:  o Whole wheat toast or whole wheat English muffins with peanut butter & hard boiled egg o Steel cut oats topped with apples & cinnamon and skim milk  o Fresh fruit: banana, strawberries, melon, berries, peaches  o Smoothies: strawberries, bananas, greek yogurt, peanut butter o Low fat greek yogurt with blueberries and granola  o Egg white omelet with spinach and mushrooms o Breakfast couscous: whole wheat couscous, apricots, skim milk, cranberries  . Sandwiches:  o Hummus and grilled vegetables (peppers, zucchini, squash) on whole wheat bread   o Grilled chicken on whole wheat pita with lettuce, tomatoes, cucumbers or tzatziki  o Tuna salad on whole wheat bread: tuna salad made with greek yogurt, olives, red peppers, capers, green onions o Garlic rosemary lamb pita: lamb  sauted with garlic, rosemary, salt & pepper; add lettuce, cucumber, greek yogurt to pita - flavor with lemon juice and black pepper  . Seafood:  o Mediterranean grilled salmon, seasoned with garlic, basil, parsley, lemon juice and black pepper o Shrimp, lemon, and spinach whole-grain pasta salad made with low fat greek yogurt  o Seared scallops with lemon orzo  o Seared tuna steaks seasoned salt, pepper, coriander topped with tomato mixture of olives, tomatoes, olive oil, minced garlic, parsley, green onions and cappers  . Meats:  o Herbed greek chicken salad with kalamata olives, cucumber, feta  o Red bell peppers stuffed with spinach, bulgur, lean ground beef (or lentils) & topped with feta   o Kebabs: skewers of chicken, tomatoes, onions, zucchini, squash  o [place] burgers: made with red onions, mint, dill, lemon juice, feta cheese topped with roasted red peppers . Vegetarian o Cucumber salad: cucumbers, artichoke hearts, celery, red onion, feta cheese, tossed in olive oil & lemon juice  o Hummus and whole grain pita points with a greek salad (lettuce, tomato, feta, olives, cucumbers, red onion) o Lentil soup with celery, carrots made with vegetable broth, garlic, salt and pepper  o Tabouli salad: parsley, bulgur, mint, scallions, cucumbers, tomato, radishes, lemon juice, olive oil, salt and pepper.       Yearly flu vaccine  Health Maintenance Plans    COLONOSCOPY EVERY 10 YEARS   DIABETES MELLITUS FOOT EXAM   DIABETES MELLITUS OPHTHAMOLOGY EXAM   HEMOGLOBIN A1C EVERY 6 MONTHS   Hepatitis C screening :Birth year 35 through [place] AGE [place] UP   MAMMOGRAM EVERY 2 YEARS   Osteoporosis Screening   Pneumococcal vaccines Low Risk Adult   TETANUS/TDAP AGE 11 +   ZOSTAVAX AGE 47 AND OVER         Mariann Laster K. Panosh M.D.   Lab Results  Component Value Date   WBC 6.2 07/02/2015   HGB 13.7 07/02/2015   HCT 40.2 07/02/2015   PLT 210.0  07/02/2015   GLUCOSE 150* 07/02/2015   CHOL 158 07/02/2015   TRIG 115.0 07/02/2015   HDL 61.60 07/02/2015   LDLDIRECT 173.9 11/09/2013   LDLCALC 73 07/02/2015   ALT 19 07/02/2015   AST 19 07/02/2015   NA 140 07/02/2015   K 4.8 07/02/2015   CL 105 07/02/2015   CREATININE 0.66 07/02/2015   BUN 12 07/02/2015   CO2 27 07/02/2015   TSH 1.57 07/02/2015   INR 0.93 08/31/2013   HGBA1C 6.8* 07/02/2015   MICROALBUR 1.9 07/02/2015  labs at goal.

## 2015-07-02 NOTE — Patient Instructions (Addendum)
Will notify you  of labs when available. Healthy lifestyle includes : At least 150 minutes of exercise weeks  , weight at healthy levels, which is usually   BMI 19-25. Avoid trans fats and processed foods;  Increase fresh fruits and veges to 5 servings per day. And avoid sweet beverages including tea and juice. Mediterranean diet with olive oil and nuts have been noted to be heart and brain healthy . Avoid tobacco products . Limit  alcohol to  7 per week for women and 14 servings for men.  Get adequate sleep . Wear seat belts . Don't text and drive .   Advanced directive  Checking  Make sure    We get copies of labs done elsewhere. If all ok then plan labs in 6 months and rov  .  Try ranitidine instead of tums that can cause constipation.      Why follow it? Research shows. . Those who follow the Mediterranean diet have a reduced risk of heart disease  . The diet is associated with a reduced incidence of Parkinson's and Alzheimer's diseases . People following the diet may have longer life expectancies and lower rates of chronic diseases  . The Dietary Guidelines for Americans recommends the Mediterranean diet as an eating plan to promote health and prevent disease  What Is the Mediterranean Diet?  . Healthy eating plan based on typical foods and recipes of Mediterranean-style cooking . The diet is primarily a plant based diet; these foods should make up a majority of meals   Starches - Plant based foods should make up a majority of meals - They are an important sources of vitamins, minerals, energy, antioxidants, and fiber - Choose whole grains, foods high in fiber and minimally processed items  - Typical grain sources include wheat, oats, barley, corn, brown rice, bulgar, farro, millet, polenta, couscous  - Various types of beans include chickpeas, lentils, fava beans, black beans, white beans   Fruits  Veggies - Large quantities of antioxidant rich fruits & veggies; 6 or more  servings  - Vegetables can be eaten raw or lightly drizzled with oil and cooked  - Vegetables common to the traditional Mediterranean Diet include: artichokes, arugula, beets, broccoli, brussel sprouts, cabbage, carrots, celery, collard greens, cucumbers, eggplant, kale, leeks, lemons, lettuce, mushrooms, okra, onions, peas, peppers, potatoes, pumpkin, radishes, rutabaga, shallots, spinach, sweet potatoes, turnips, zucchini - Fruits common to the Mediterranean Diet include: apples, apricots, avocados, cherries, clementines, dates, figs, grapefruits, grapes, melons, nectarines, oranges, peaches, pears, pomegranates, strawberries, tangerines  Fats - Replace butter and margarine with healthy oils, such as olive oil, canola oil, and tahini  - Limit nuts to no more than a handful a day  - Nuts include walnuts, almonds, pecans, pistachios, pine nuts  - Limit or avoid candied, honey roasted or heavily salted nuts - Olives are central to the [employer] - can be eaten whole or used in a variety of dishes   Meats Protein - Limiting red meat: no more than a few times a month - When eating red meat: choose lean cuts and keep the portion to the size of deck of cards - Eggs: approx. 0 to 4 times a week  - Fish and lean poultry: at least 2 a week  - Healthy protein sources include, chicken, [place], lean beef, lamb - Increase intake of seafood such as tuna, salmon, trout, mackerel, shrimp, scallops - Avoid or limit high fat processed meats such as sausage and bacon  Dairy -  Include moderate amounts of low fat dairy products  - Focus on healthy dairy such as fat free yogurt, skim milk, low or reduced fat cheese - Limit dairy products higher in fat such as whole or 2% milk, cheese, ice cream  Alcohol - Moderate amounts of red wine is ok  - No more than 5 oz daily for women (all ages) and men older than age 5  - No more than 10 oz of wine daily for men younger than 64  Other - Limit sweets and other  desserts  - Use herbs and spices instead of salt to flavor foods  - Herbs and spices common to the traditional Mediterranean Diet include: basil, bay leaves, chives, cloves, cumin, fennel, garlic, lavender, marjoram, mint, oregano, parsley, pepper, rosemary, sage, savory, sumac, tarragon, thyme   It's not just a diet, it's a lifestyle:  . The Mediterranean diet includes lifestyle factors typical of those in the region  . Foods, drinks and meals are best eaten with others and savored . Daily physical activity is important for overall good health . This could be strenuous exercise like running and aerobics . This could also be more leisurely activities such as walking, housework, yard-work, or taking the stairs . Moderation is the key; a balanced and healthy diet accommodates most foods and drinks . Consider portion sizes and frequency of consumption of certain foods   Meal Ideas & Options:  . Breakfast:  o Whole wheat toast or whole wheat English muffins with peanut butter & hard boiled egg o Steel cut oats topped with apples & cinnamon and skim milk  o Fresh fruit: banana, strawberries, melon, berries, peaches  o Smoothies: strawberries, bananas, greek yogurt, peanut butter o Low fat greek yogurt with blueberries and granola  o Egg white omelet with spinach and mushrooms o Breakfast couscous: whole wheat couscous, apricots, skim milk, cranberries  . Sandwiches:  o Hummus and grilled vegetables (peppers, zucchini, squash) on whole wheat bread   o Grilled chicken on whole wheat pita with lettuce, tomatoes, cucumbers or tzatziki  o Tuna salad on whole wheat bread: tuna salad made with greek yogurt, olives, red peppers, capers, green onions o Garlic rosemary lamb pita: lamb sauted with garlic, rosemary, salt & pepper; add lettuce, cucumber, greek yogurt to pita - flavor with lemon juice and black pepper  . Seafood:  o Mediterranean grilled salmon, seasoned with garlic, basil, parsley, lemon  juice and black pepper o Shrimp, lemon, and spinach whole-grain pasta salad made with low fat greek yogurt  o Seared scallops with lemon orzo  o Seared tuna steaks seasoned salt, pepper, coriander topped with tomato mixture of olives, tomatoes, olive oil, minced garlic, parsley, green onions and cappers  . Meats:  o Herbed greek chicken salad with kalamata olives, cucumber, feta  o Red bell peppers stuffed with spinach, bulgur, lean ground beef (or lentils) & topped with feta   o Kebabs: skewers of chicken, tomatoes, onions, zucchini, squash  o [place] burgers: made with red onions, mint, dill, lemon juice, feta cheese topped with roasted red peppers . Vegetarian o Cucumber salad: cucumbers, artichoke hearts, celery, red onion, feta cheese, tossed in olive oil & lemon juice  o Hummus and whole grain pita points with a greek salad (lettuce, tomato, feta, olives, cucumbers, red onion) o Lentil soup with celery, carrots made with vegetable broth, garlic, salt and pepper  o Tabouli salad: parsley, bulgur, mint, scallions, cucumbers, tomato, radishes, lemon juice, olive oil, salt and pepper.  Yearly flu vaccine   Health Maintenance Plans    COLONOSCOPY EVERY 10 YEARS   DIABETES MELLITUS FOOT EXAM   DIABETES MELLITUS OPHTHAMOLOGY EXAM   HEMOGLOBIN A1C EVERY 6 MONTHS   Hepatitis C screening :Birth year [birth year] through [place] AGE 46 MONTHS AND UP   MAMMOGRAM EVERY 2 YEARS   Osteoporosis Screening   Pneumococcal vaccines Low Risk Adult   TETANUS/TDAP AGE 22 +   ZOSTAVAX AGE 33 AND OVER

## 2015-07-03 DIAGNOSIS — L821 Other seborrheic keratosis: Secondary | ICD-10-CM | POA: Diagnosis not present

## 2015-07-03 DIAGNOSIS — L820 Inflamed seborrheic keratosis: Secondary | ICD-10-CM | POA: Diagnosis not present

## 2015-07-03 DIAGNOSIS — L280 Lichen simplex chronicus: Secondary | ICD-10-CM | POA: Diagnosis not present

## 2015-07-03 DIAGNOSIS — L814 Other melanin hyperpigmentation: Secondary | ICD-10-CM | POA: Diagnosis not present

## 2015-07-03 DIAGNOSIS — Z85828 Personal history of other malignant neoplasm of skin: Secondary | ICD-10-CM | POA: Diagnosis not present

## 2015-07-30 ENCOUNTER — Other Ambulatory Visit (HOSPITAL_COMMUNITY)
Admission: RE | Admit: 2015-07-30 | Discharge: 2015-07-30 | Disposition: A | Discharge status: Home / Self Care | Payer: Medicare Other | Source: Ambulatory Visit | Attending: Family | Admitting: Family

## 2015-07-30 ENCOUNTER — Ambulatory Visit (INDEPENDENT_AMBULATORY_CARE_PROVIDER_SITE_OTHER): Payer: Medicare Other | Admitting: Family

## 2015-07-30 ENCOUNTER — Encounter: Payer: Self-pay | Admitting: Family

## 2015-07-30 VITALS — BP 118/82 | HR 75 | Temp 98.6°F | Ht 63.0 in | Wt 196.8 lb

## 2015-07-30 DIAGNOSIS — L298 Other pruritus: Secondary | ICD-10-CM | POA: Diagnosis not present

## 2015-07-30 DIAGNOSIS — N760 Acute vaginitis: Secondary | ICD-10-CM | POA: Insufficient documentation

## 2015-07-30 DIAGNOSIS — N898 Other specified noninflammatory disorders of vagina: Secondary | ICD-10-CM

## 2015-07-30 LAB — POCT URINALYSIS DIPSTICK
Bilirubin, UA: NEGATIVE
Blood, UA: NEGATIVE
Glucose, UA: 100
KETONES UA: NEGATIVE
Leukocytes, UA: NEGATIVE
Nitrite, UA: NEGATIVE
Protein, UA: NEGATIVE
Spec Grav, UA: 1.02
Urobilinogen, UA: 0.2
pH, UA: 7

## 2015-07-30 MED ORDER — NYSTATIN 100000 UNIT/GM EX CREA: 1.0000 "application " | TOPICAL_CREAM | Freq: Two times a day (BID) | CUTANEOUS | Status: DC

## 2015-07-30 NOTE — Patient Instructions (Signed)

## 2015-07-30 NOTE — Progress Notes (Signed)
Pre visit review using our clinic review tool, if applicable. No additional management support is needed unless otherwise documented below in the visit note. 

## 2015-07-30 NOTE — Progress Notes (Signed)
Subjective:    Patient ID: Chelsey [name], female    DOB: 1946/02/04, 69 y.o.   MRN: [id number]  HPI  69 year old white female, nonsmoker with a history of type 2 diabetes is in today with complaints of vaginal itching and burning ongoing 5 days. Denies any urinary frequency or urgency, no abdominal pain or back pain. No concerns of sexually transmitted diseases.   Review of Systems  Constitutional: Negative.   Respiratory: Negative.   Cardiovascular: Negative.   Gastrointestinal: Negative.   Endocrine: Negative.   Genitourinary: Positive for dysuria. Negative for urgency, frequency, hematuria and pelvic pain.       Vaginal itching  Musculoskeletal: Negative.   Skin: Negative.   Neurological: Negative.   Psychiatric/Behavioral: Negative.    Past Medical History  Diagnosis Date  . Diabetes mellitus   . Allergy   . Osteoarthritis   . Hypertension   . Hyperlipidemia   . GERD (gastroesophageal reflux disease)   . Pneumonia 07/28/2011  . Sleep apnea     setting of 10  . Breast cancer ([place])   . Colitis   . Sinusitis   . Aphthous ulcer of mouth 2010    related to chemotherapy   . Carpal tunnel syndrome of right wrist   . Squamous cell carcinoma ([facility])     left leg  . Colon polyp   . Diverticulosis   . Lymphedema 08-31-13    left arm    Social History   Social History  . Marital Status: Married    Spouse Name: N/A  . Number of Children: 3  . 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 Smoking status: Never Smoker   . Smokeless tobacco: Never Used  . Alcohol Use: 3.6 oz/week    6 Glasses of wine per week     Comment: 1-2 glasses every other night  . Drug Use: No  . Sexual Activity: Yes    Birth Control/ Protection: Post-menopausal, Surgical   Other Topics Concern  . Not on file   Social History Narrative   cb x 3   Realtor married    Bereaved  Parent   2 adult children   Mother with dementia  and breast cancer now in nursing home care.   Passed away sept 19 15  Age 17    Past Surgical History  Procedure Laterality Date  . Cesarean section      x 3  . Reconstruction breast immediate / delayed w/ tissue expander Left   . Skin graft    . Tonsillectomy      as child  . Total knee arthroplasty Left 12/20/2012    Procedure: TOTAL KNEE ARTHROPLASTY;  Surgeon: Gearlean Alf, MD;  Location: [facility];  Service: Orthopedics;  Laterality: Left;  . Tubal ligation    . Squamous cell carcinoma excision Left     Left lower extremity  . Mastectomy Left 11/23    2010  . Total knee arthroplasty Right 09/12/2013    Procedure: RIGHT TOTAL KNEE ARTHROPLASTY;  Surgeon: Gearlean Alf, MD;  Location: [facility];  Service: Orthopedics;  Laterality: Right;    Family History  Problem Relation Age of Onset  . Alzheimer's disease Mother   . Sleep apnea Mother   . Diabetes Mother   . Hypertension Mother   . Hyperlipidemia Mother   . Breast cancer Mother 15    triple negative  .  Anxiety disorder Daughter   . Hypertension Son     pulmonary  . Congenital heart disease Son 0    TGA  died age 55     Allergies  Allergen Reactions  . Ambien [Zolpidem Tartrate]     Felt bad hard to function  . Amoxicillin     REACTION: unspecified  . Lipitor [Atorvastatin Calcium]     Leg cramps  . Penicillins     As child  . Prednisone Itching  . Latex Rash    Current Outpatient Prescriptions on File Prior to Visit  Medication Sig Dispense Refill  . ALPRAZolam (XANAX) 0.25 MG tablet take 1 tablet by mouth at bedtime if needed for anxiety 40 tablet 0  . anastrozole (ARIMIDEX) 1 MG tablet Take 1 tablet (1 mg total) by mouth daily. 90 tablet 5  . CRESTOR 10 MG tablet take 1 tablet by mouth once daily 30 tablet 5  . cycloSPORINE (RESTASIS) 0.05 % ophthalmic emulsion Place 1 drop into both eyes daily.    Marland Kitchen denosumab (PROLIA) 60 MG/ML SOLN injection Inject 60 mg into the skin every 6 (six) months. Administer  in upper arm, thigh, or abdomen    . esomeprazole (NEXIUM) 40 MG capsule Take 40 mg by mouth daily at 12 noon. Takes bedtime    . losartan (COZAAR) 50 MG tablet take 1 tablet by mouth once daily 90 tablet 1  . metFORMIN (GLUCOPHAGE-XR) 500 MG 24 hr tablet take 2 tablets bid  With meals (Patient taking differently: Take 2 tablets po every morning and 1 tablet po every evening) 120 tablet 6  . sertraline (ZOLOFT) 50 MG tablet Take 1 tablet (50 mg total) by mouth every evening. (Patient taking differently: Take 25 mg by mouth every evening. ) 90 tablet 0  . traZODone (DESYREL) 50 MG tablet take 1/2 to 1 tablet by mouth at bedtime if needed for sleep 30 tablet 5   No current facility-administered medications on file prior to visit.    BP 118/82 mmHg  Pulse 75  Temp(Src) 98.6 F (37 C) (Oral)  Ht 5\' 3"  (1.6 m)  Wt 196 lb 12.8 oz (89.268 kg)  BMI 34.87 kg/m2chart    Objective:   Physical Exam  Constitutional: She is oriented to person, place, and time. She appears well-developed and well-nourished.  Neck: Normal range of motion. Neck supple.  Cardiovascular: Normal rate, regular rhythm and normal heart sounds.   Pulmonary/Chest: Effort normal and breath sounds normal.  Abdominal: Soft. Bowel sounds are normal.  Genitourinary: No vaginal discharge found.  External vaginal irritation noted and redness.  Musculoskeletal: Normal range of motion.  Neurological: She is alert and oriented to person, place, and time.  Skin: Skin is warm and dry.  Psychiatric: She has a normal mood and affect.          Assessment & Plan:  Chelsey [name] was seen today for vaginal itching.  Diagnoses and all orders for this visit:  Vaginal itching -     POC Urinalysis Dipstick -     KOH prep  Other orders -     nystatin cream (MYCOSTATIN); Apply 1 application topically 2 (two) times daily.   External vaginal candida noted an irritation we'll treat with nystatin twice a day. Keep the area clean and dry.  Maintain good glycemic control.

## 2015-07-31 ENCOUNTER — Telehealth: Payer: Self-pay | Admitting: *Deleted

## 2015-07-31 ENCOUNTER — Other Ambulatory Visit: Payer: Self-pay | Admitting: *Deleted

## 2015-07-31 DIAGNOSIS — N898 Other specified noninflammatory disorders of vagina: Secondary | ICD-10-CM

## 2015-07-31 NOTE — Telephone Encounter (Signed)
Chelsey [name] called from [facility] stating they received a specimen and cannot perform a KOH on this as this would need to go to [facility].  I advised Chelsey [name] wanted to check for a yeast infection and she stated this can be ordered under a [facility] affirm-wet prep and I entered the order.

## 2015-08-01 LAB — CERVICOVAGINAL ANCILLARY ONLY: WET PREP (BD AFFIRM): NEGATIVE

## 2015-08-03 ENCOUNTER — Other Ambulatory Visit: Payer: Self-pay | Admitting: Internal Medicine

## 2015-08-06 ENCOUNTER — Other Ambulatory Visit: Payer: Self-pay

## 2015-08-06 DIAGNOSIS — Z1231 Encounter for screening mammogram for malignant neoplasm of breast: Secondary | ICD-10-CM

## 2015-08-14 DIAGNOSIS — I890 Lymphedema, not elsewhere classified: Secondary | ICD-10-CM | POA: Diagnosis not present

## 2015-08-14 DIAGNOSIS — Z853 Personal history of malignant neoplasm of breast: Secondary | ICD-10-CM | POA: Diagnosis not present

## 2015-08-27 DIAGNOSIS — N898 Other specified noninflammatory disorders of vagina: Secondary | ICD-10-CM | POA: Diagnosis not present

## 2015-08-27 DIAGNOSIS — N905 Atrophy of vulva: Secondary | ICD-10-CM | POA: Diagnosis not present

## 2015-08-31 ENCOUNTER — Other Ambulatory Visit: Payer: Self-pay | Admitting: Internal Medicine

## 2015-09-05 ENCOUNTER — Ambulatory Visit: Payer: Medicare Other

## 2015-09-20 DIAGNOSIS — Z853 Personal history of malignant neoplasm of breast: Secondary | ICD-10-CM | POA: Diagnosis not present

## 2015-10-09 DIAGNOSIS — L918 Other hypertrophic disorders of the skin: Secondary | ICD-10-CM | POA: Diagnosis not present

## 2015-10-09 DIAGNOSIS — L280 Lichen simplex chronicus: Secondary | ICD-10-CM | POA: Diagnosis not present

## 2015-10-09 DIAGNOSIS — L820 Inflamed seborrheic keratosis: Secondary | ICD-10-CM | POA: Diagnosis not present

## 2015-10-09 DIAGNOSIS — L821 Other seborrheic keratosis: Secondary | ICD-10-CM | POA: Diagnosis not present

## 2015-10-09 DIAGNOSIS — Z85828 Personal history of other malignant neoplasm of skin: Secondary | ICD-10-CM | POA: Diagnosis not present

## 2015-10-09 DIAGNOSIS — D1801 Hemangioma of skin and subcutaneous tissue: Secondary | ICD-10-CM | POA: Diagnosis not present

## 2015-10-09 DIAGNOSIS — L905 Scar conditions and fibrosis of skin: Secondary | ICD-10-CM | POA: Diagnosis not present

## 2015-10-11 ENCOUNTER — Ambulatory Visit
Admission: RE | Admit: 2015-10-11 | Discharge: 2015-10-11 | Disposition: A | Discharge status: Home / Self Care | Payer: Medicare Other | Source: Ambulatory Visit

## 2015-10-11 DIAGNOSIS — Z1231 Encounter for screening mammogram for malignant neoplasm of breast: Secondary | ICD-10-CM | POA: Diagnosis not present

## 2015-11-05 ENCOUNTER — Telehealth: Payer: Self-pay | Admitting: *Deleted

## 2015-11-05 NOTE — Telephone Encounter (Signed)
Requesting refill on Alprazolam 0.25 mg tablet  Last seen: 07/02/15 Last refill: 09/06/14 #40 with 0 refills  Please advise if approved?  Pharmacy:  [facility] [phone] ([phone] (f)

## 2015-11-06 ENCOUNTER — Encounter: Payer: Self-pay | Admitting: Internal Medicine

## 2015-11-06 ENCOUNTER — Ambulatory Visit (INDEPENDENT_AMBULATORY_CARE_PROVIDER_SITE_OTHER): Payer: Medicare Other | Admitting: Internal Medicine

## 2015-11-06 VITALS — BP 120/70 | HR 59 | Temp 98.7°F | Resp 20 | Ht 63.0 in | Wt 196.0 lb

## 2015-11-06 DIAGNOSIS — J018 Other acute sinusitis: Secondary | ICD-10-CM | POA: Diagnosis not present

## 2015-11-06 DIAGNOSIS — E1100 Type 2 diabetes mellitus with hyperosmolarity without nonketotic hyperglycemic-hyperosmolar coma (NKHHC): Secondary | ICD-10-CM

## 2015-11-06 DIAGNOSIS — I10 Essential (primary) hypertension: Secondary | ICD-10-CM

## 2015-11-06 MED ORDER — AZITHROMYCIN 250 MG PO TABS: ORAL_TABLET | ORAL | Status: DC

## 2015-11-06 MED ORDER — ALPRAZOLAM 0.25 MG PO TABS: ORAL_TABLET | ORAL | Status: DC

## 2015-11-06 NOTE — Patient Instructions (Signed)
Acute sinusitis symptoms  are generally not helped by antibiotic therapy.  Use saline irrigation, warm  moist compresses and over-the-counter decongestants only as directed.  Call if there is no improvement in 5 to 7 days, or sooner if you develop increasing pain, fever, or any new symptoms.  TREATMENT  Most cases of acute sinusitis are related to a viral infection and will resolve on their own within 10 days. Sometimes, medicines are prescribed to help relieve symptoms of both acute and chronic sinusitis. These may include pain medicines, decongestants, nasal steroid sprays, or saline sprays.  However, for sinusitis related to a bacterial infection, your health care provider will prescribe antibiotic medicines. These are medicines that will help kill the bacteria causing the infection.   HOME CARE INSTRUCTIONS  Drink plenty of water. Water helps thin the mucus so your sinuses can drain more easily.  Use a humidifier.  Inhale steam 3-4 times a day (for example, sit in the bathroom with the shower running).  Apply a warm, moist washcloth to your face 3-4 times a day, or as directed by your health care provider.  Use saline nasal sprays to help moisten and clean your sinuses.  Take medicines only as directed by your health care provider.  If you were prescribed either an antibiotic or antifungal medicine, finish it all even if you start to feel better.   SEEK IMMEDIATE MEDICAL CARE IF:  You have increasing pain or severe headaches.  You have nausea, vomiting, or drowsiness.  You have swelling around your face.  You have vision problems.  You have a stiff neck.  You have difficulty breathing.

## 2015-11-06 NOTE — Progress Notes (Signed)
Pre visit review using our clinic review tool, if applicable. No additional management support is needed unless otherwise documented below in the visit note. 

## 2015-11-06 NOTE — Telephone Encounter (Signed)
Called to the pharmacy and left on machine. 

## 2015-11-06 NOTE — Progress Notes (Signed)
Subjective:    Patient ID: Chelsey [name], female    DOB: 13-May-1946, 70 y.o.   MRN: [id number]  HPI  70 year old patient who presents with a two-week history of cough, head and chest congestion.  Over the past few days has developed fever as high as 101.2 degrees.  Fever is the nocturnal only.  She initially had chest congestion that has improved but now has developed increasing sinus congestion, pressure, left ear discomfort and dizziness.  She states that she has a history of prior sinus infections.  She does have a history of allergic rhinitis  Past Medical History  Diagnosis Date  . Diabetes mellitus   . Allergy   . Osteoarthritis   . Hypertension   . Hyperlipidemia   . GERD (gastroesophageal reflux disease)   . Pneumonia 07/28/2011  . Sleep apnea     setting of 10  . Breast cancer ([place])   . Colitis   . Sinusitis   . Aphthous ulcer of mouth 2010    related to chemotherapy   . Carpal tunnel syndrome of right wrist   . Squamous cell carcinoma ([facility])     left leg  . Colon polyp   . Diverticulosis   . Lymphedema 08-31-13    left arm    Social History   Social History  . Marital Status: Married    Spouse Name: N/A  . Number of Children: 3  . 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 Smoking status: Never Smoker   . Smokeless tobacco: Never Used  . Alcohol Use: 3.6 oz/week    6 Glasses of wine per week     Comment: 1-2 glasses every other night  . Drug Use: No  . Sexual Activity: Yes    Birth Control/ Protection: Post-menopausal, Surgical   Other Topics Concern  . Not on file   Social History Narrative   cb x 3   Realtor married    Bereaved  Parent   2 adult children   Mother with dementia and breast cancer now in nursing home care.   Passed away sept 55 15  Age 58    Past Surgical History  Procedure Laterality Date  . Cesarean section      x 3  . Reconstruction breast immediate / delayed  w/ tissue expander Left   . Skin graft    . Tonsillectomy      as child  . Total knee arthroplasty Left 12/20/2012    Procedure: TOTAL KNEE ARTHROPLASTY;  Surgeon: Gearlean Alf, MD;  Location: [facility];  Service: Orthopedics;  Laterality: Left;  . Tubal ligation    . Squamous cell carcinoma excision Left     Left lower extremity  . Mastectomy Left 11/23    2010  . Total knee arthroplasty Right 09/12/2013    Procedure: RIGHT TOTAL KNEE ARTHROPLASTY;  Surgeon: Gearlean Alf, MD;  Location: [facility];  Service: Orthopedics;  Laterality: Right;    Family History  Problem Relation Age of Onset  . Alzheimer's disease Mother   . Sleep apnea Mother   . Diabetes Mother   . Hypertension Mother   . Hyperlipidemia Mother   . Breast cancer Mother 89    triple negative  . Anxiety disorder Daughter   . Hypertension Son     pulmonary  . Congenital heart disease Son 0    TGA  died  age 73     Allergies  Allergen Reactions  . Ambien [Zolpidem Tartrate]     Felt bad hard to function  . Amoxicillin     REACTION: unspecified  . Lipitor [Atorvastatin Calcium]     Leg cramps  . Penicillins     As child  . Prednisone Itching  . Latex Rash    Current Outpatient Prescriptions on File Prior to Visit  Medication Sig Dispense Refill  . ALPRAZolam (XANAX) 0.25 MG tablet take 1 tablet by mouth at bedtime if needed for anxiety 40 tablet 0  . anastrozole (ARIMIDEX) 1 MG tablet Take 1 tablet (1 mg total) by mouth daily. 90 tablet 5  . CRESTOR 10 MG tablet take 1 tablet by mouth once daily 30 tablet 5  . cycloSPORINE (RESTASIS) 0.05 % ophthalmic emulsion Place 1 drop into both eyes daily.    Marland Kitchen esomeprazole (NEXIUM) 40 MG capsule Take 40 mg by mouth daily at 12 noon. Takes bedtime    . losartan (COZAAR) 50 MG tablet take 1 tablet by mouth once daily 90 tablet 2  . metFORMIN (GLUCOPHAGE-XR) 500 MG 24 hr tablet take 2 tablets by mouth twice a day with meals 120 tablet 5  . sertraline (ZOLOFT) 50 MG  tablet Take 1 tablet (50 mg total) by mouth every evening. (Patient taking differently: Take 25 mg by mouth every evening. ) 90 tablet 0  . sertraline (ZOLOFT) 50 MG tablet take 1 tablet by mouth once daily 90 tablet 0  . traZODone (DESYREL) 50 MG tablet take 1/2 to 1 tablet by mouth at bedtime if needed for sleep 30 tablet 5   No current facility-administered medications on file prior to visit.    BP 120/70 mmHg  Pulse 59  Temp(Src) 98.7 F (37.1 C) (Oral)  Resp 20  Ht 5\' 3"  (1.6 m)  Wt 196 lb (88.905 kg)  BMI 34.73 kg/m2  SpO2 99%     Review of Systems  Constitutional: Positive for fever, activity change, appetite change and fatigue. Negative for chills and diaphoresis.  HENT: Positive for congestion, postnasal drip and sinus pressure. Negative for dental problem, hearing loss, rhinorrhea, sore throat and tinnitus.   Eyes: Negative for pain, discharge and visual disturbance.  Respiratory: Positive for cough. Negative for shortness of breath.   Cardiovascular: Negative for chest pain, palpitations and leg swelling.  Gastrointestinal: Negative for nausea, vomiting, abdominal pain, diarrhea, constipation, blood in stool and abdominal distention.  Genitourinary: Negative for dysuria, urgency, frequency, hematuria, flank pain, vaginal bleeding, vaginal discharge, difficulty urinating, vaginal pain and pelvic pain.  Musculoskeletal: Negative for joint swelling, arthralgias and gait problem.  Skin: Negative for rash.  Neurological: Negative for dizziness, syncope, speech difficulty, weakness, numbness and headaches.  Hematological: Negative for adenopathy.  Psychiatric/Behavioral: Negative for behavioral problems, dysphoric mood and agitation. The patient is not nervous/anxious.        Objective:   Physical Exam  Constitutional: She is oriented to person, place, and time. She appears well-developed and well-nourished.  HENT:  Head: Normocephalic.  Right Ear: External ear normal.    Left Ear: External ear normal.  Mouth/Throat: Oropharynx is clear and moist.  No focal sinus tenderness  Eyes: Conjunctivae and EOM are normal. Pupils are equal, round, and reactive to light.  Neck: Normal range of motion. Neck supple. No thyromegaly present.  Cardiovascular: Normal rate, regular rhythm, normal heart sounds and intact distal pulses.   Pulmonary/Chest: Effort normal.  Few scattered rhonchi  Abdominal: Soft. Bowel sounds  are normal. She exhibits no mass. There is no tenderness.  Musculoskeletal: Normal range of motion.  Lymphadenopathy:    She has no cervical adenopathy.  Neurological: She is alert and oriented to person, place, and time.  Skin: Skin is warm and dry. No rash noted.  Psychiatric: She has a normal mood and affect. Her behavior is normal.          Assessment & Plan:   Viral syndrome with upper and lower tract symptoms.  Will treat aggressively symptomatically for a viral sinusitis.  Continue expectorants decongestants. A prescription for azithromycin dispensed.  If she develops clinical worsening over the next 48 hours History of allergic rhinitis History of type 2 diabetes

## 2015-11-06 NOTE — Telephone Encounter (Signed)
Call in #[phone] with no rf  

## 2015-11-23 DIAGNOSIS — M810 Age-related osteoporosis without current pathological fracture: Secondary | ICD-10-CM | POA: Diagnosis not present

## 2015-11-23 DIAGNOSIS — C50912 Malignant neoplasm of unspecified site of left female breast: Secondary | ICD-10-CM | POA: Diagnosis not present

## 2015-11-23 DIAGNOSIS — M818 Other osteoporosis without current pathological fracture: Secondary | ICD-10-CM | POA: Diagnosis not present

## 2015-11-23 DIAGNOSIS — T386X5A Adverse effect of antigonadotrophins, antiestrogens, antiandrogens, not elsewhere classified, initial encounter: Secondary | ICD-10-CM | POA: Diagnosis not present

## 2015-11-23 DIAGNOSIS — Z79899 Other long term (current) drug therapy: Secondary | ICD-10-CM | POA: Diagnosis not present

## 2015-11-23 DIAGNOSIS — I10 Essential (primary) hypertension: Secondary | ICD-10-CM | POA: Diagnosis not present

## 2015-11-23 DIAGNOSIS — Z9221 Personal history of antineoplastic chemotherapy: Secondary | ICD-10-CM | POA: Diagnosis not present

## 2015-11-23 DIAGNOSIS — Z79810 Long term (current) use of selective estrogen receptor modulators (SERMs): Secondary | ICD-10-CM | POA: Diagnosis not present

## 2015-11-23 DIAGNOSIS — Z9012 Acquired absence of left breast and nipple: Secondary | ICD-10-CM | POA: Diagnosis not present

## 2015-11-23 DIAGNOSIS — Z853 Personal history of malignant neoplasm of breast: Secondary | ICD-10-CM | POA: Diagnosis not present

## 2015-11-23 DIAGNOSIS — Z7984 Long term (current) use of oral hypoglycemic drugs: Secondary | ICD-10-CM | POA: Diagnosis not present

## 2015-11-23 DIAGNOSIS — Z923 Personal history of irradiation: Secondary | ICD-10-CM | POA: Diagnosis not present

## 2015-12-24 ENCOUNTER — Other Ambulatory Visit: Payer: Medicare Other

## 2015-12-30 NOTE — Progress Notes (Signed)
Document opened and reviewed for OV but appt  canceled same day .  

## 2015-12-31 ENCOUNTER — Other Ambulatory Visit: Payer: Self-pay | Admitting: Family Medicine

## 2015-12-31 ENCOUNTER — Encounter: Payer: Medicare Other | Admitting: Internal Medicine

## 2015-12-31 MED ORDER — TRAZODONE HCL 50 MG PO TABS: ORAL_TABLET | ORAL | Status: DC

## 2015-12-31 NOTE — Telephone Encounter (Signed)
Sent to the pharmacy by e-scribe. 

## 2016-01-01 DIAGNOSIS — H40013 Open angle with borderline findings, low risk, bilateral: Secondary | ICD-10-CM | POA: Diagnosis not present

## 2016-01-01 DIAGNOSIS — H2513 Age-related nuclear cataract, bilateral: Secondary | ICD-10-CM | POA: Diagnosis not present

## 2016-01-01 DIAGNOSIS — E119 Type 2 diabetes mellitus without complications: Secondary | ICD-10-CM | POA: Diagnosis not present

## 2016-01-14 ENCOUNTER — Other Ambulatory Visit (INDEPENDENT_AMBULATORY_CARE_PROVIDER_SITE_OTHER): Payer: Medicare Other

## 2016-01-14 DIAGNOSIS — E119 Type 2 diabetes mellitus without complications: Secondary | ICD-10-CM

## 2016-01-14 LAB — HEMOGLOBIN A1C: Hgb A1c MFr Bld: 7 % — ABNORMAL HIGH (ref 4.6–6.5)

## 2016-01-15 DIAGNOSIS — H2511 Age-related nuclear cataract, right eye: Secondary | ICD-10-CM | POA: Diagnosis not present

## 2016-01-15 DIAGNOSIS — H2512 Age-related nuclear cataract, left eye: Secondary | ICD-10-CM | POA: Diagnosis not present

## 2016-01-15 DIAGNOSIS — H40013 Open angle with borderline findings, low risk, bilateral: Secondary | ICD-10-CM | POA: Diagnosis not present

## 2016-01-15 DIAGNOSIS — E119 Type 2 diabetes mellitus without complications: Secondary | ICD-10-CM | POA: Diagnosis not present

## 2016-01-16 NOTE — Progress Notes (Signed)
Chief Complaint  Patient presents with  . Follow-up    labs    HPI: Chelsey [name] 70 y.o.  comesin for Chronic disease management missed last appt  Doing well but not eating healthy and exercising   Not a physicals reason  Very stress because of 2 friends died of breast cancer recently one unexpectedly  And considering getting a pet scan to check for spread on her own but has no sx BP doing well On low dose sertraline 25 mg only   Only ocass rare use of  alaprazolan  No cp sob  New sx  Getting ove a sinus infection hasnt use flonase recently  ROS: See pertinent positives and negatives per HPI.  Past Medical History  Diagnosis Date  . Diabetes mellitus   . Allergy   . Osteoarthritis   . Hypertension   . Hyperlipidemia   . GERD (gastroesophageal reflux disease)   . Pneumonia 07/28/2011  . Sleep apnea     setting of 10  . Breast cancer ([place])   . Colitis   . Sinusitis   . Aphthous ulcer of mouth 2010    related to chemotherapy   . Carpal tunnel syndrome of right wrist   . Squamous cell carcinoma (HCC)     left leg  . Colon polyp   . Diverticulosis   . Lymphedema 08-31-13    left arm    Family History  Problem Relation Age of Onset  . Alzheimer's disease Mother   . Sleep apnea Mother   . Diabetes Mother   . Hypertension Mother   . Hyperlipidemia Mother   . Breast cancer Mother 79    triple negative  . Anxiety disorder Daughter   . Hypertension Son     pulmonary  . Congenital heart disease Son 0    TGA  died age 72     Social History   Social History  . Marital Status: Married    Spouse Name: N/A  . Number of Children: 3  . 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 Smoking status: Never Smoker   . Smokeless tobacco: Never Used  . Alcohol Use: 3.6 oz/week    6 Glasses of wine per week     Comment: 1-2 glasses every other night  . Drug Use: No  . Sexual Activity: Yes    Birth Control/  Protection: Post-menopausal, Surgical   Other Topics Concern  . 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   Passed away sept 22 15  Age 21    Outpatient Prescriptions Prior to Visit  Medication Sig Dispense Refill  . ALPRAZolam (XANAX) 0.25 MG tablet take 1 tablet by mouth at bedtime if needed for anxiety 30 tablet 0  . anastrozole (ARIMIDEX) 1 MG tablet Take 1 tablet (1 mg total) by mouth daily. 90 tablet 5  . CRESTOR 10 MG tablet take 1 tablet by mouth once daily 30 tablet 5  . cycloSPORINE (RESTASIS) 0.05 % ophthalmic emulsion Place 1 drop into both eyes daily. Reported on 01/17/2016    . esomeprazole (NEXIUM) 40 MG capsule Take 40 mg by mouth daily at 12 noon. Takes bedtime    . losartan (COZAAR) 50 MG tablet  take 1 tablet by mouth once daily 90 tablet 2  . metFORMIN (GLUCOPHAGE-XR) 500 MG 24 hr tablet take 2 tablets by mouth twice a day with meals 120 tablet 5  . traZODone (DESYREL) 50 MG tablet take 1/2 to 1 tablet by mouth at bedtime if needed for sleep 30 tablet 0  . azithromycin (ZITHROMAX) 250 MG tablet 2 tablets once daily for 3 consecutive days 6 tablet 0  . sertraline (ZOLOFT) 50 MG tablet Take 1 tablet (50 mg total) by mouth every evening. (Patient taking differently: Take 25 mg by mouth every evening. ) 90 tablet 0  . sertraline (ZOLOFT) 50 MG tablet take 1 tablet by mouth once daily 90 tablet 0   No facility-administered medications prior to visit.     EXAM:  BP 110/80 mmHg  Pulse 76  Temp(Src) 98.9 F (37.2 C) (Oral)  Wt 194 lb (87.998 kg)  Body mass index is 34.37 kg/(m^2).  GENERAL: vitals reviewed and listed above, alert, oriented, appears well hydrated and in no acute distress HEENT: atraumatic, conjunctiva  clear, no obvious abnormalities on inspection of external nose and ears  mildhy congested   MS: moves all extremities without  noticeable focal  abnormality PSYCH: pleasant and cooperative, no obvious depression or anxiety Lab Results  Component Value Date   WBC 6.2 07/02/2015   HGB 13.7 07/02/2015   HCT 40.2 07/02/2015   PLT 210.0 07/02/2015   GLUCOSE 150* 07/02/2015   CHOL 158 07/02/2015   TRIG 115.0 07/02/2015   HDL 61.60 07/02/2015   LDLDIRECT 173.9 11/09/2013   LDLCALC 73 07/02/2015   ALT 19 07/02/2015   AST 19 07/02/2015   NA 140 07/02/2015   K 4.8 07/02/2015   CL 105 07/02/2015   CREATININE 0.66 07/02/2015   BUN 12 07/02/2015   CO2 27 07/02/2015   TSH 1.57 07/02/2015   INR 0.93 08/31/2013   HGBA1C 7.0* 01/14/2016   MICROALBUR 1.9 07/02/2015   BP Readings from Last 3 Encounters:  01/17/16 110/80  11/06/15 120/70  07/30/15 118/82   Wt Readings from Last 3 Encounters:  01/17/16 194 lb (87.998 kg)  11/06/15 196 lb (88.905 kg)  07/30/15 196 lb 12.8 oz (89.268 kg)    Health Maintenance Due  Topic Date Due  . Hepatitis C Screening  1946/03/09  . OPHTHALMOLOGY EXAM  05/25/2015    ASSESSMENT AND PLAN:  Discussed the following assessment and plan:  Type 2 diabetes mellitus without complication, without long-term current use of insulin (HCC)  Essential hypertension  Medication management  HX: breast cancer  Recent bereavement dsic optinos  At this time Intensify lifestyle interventions.   And recheck all in the fall   Add INCS to her  Sinus regiemen Total visit 37mins > 50% spent counseling and coordinating care as indicated in above note and in instructions to patient .  -Patient advised to return or notify health care team  if symptoms worsen ,persist or new concerns arise.  Patient Instructions   Intensify lifestyle interventions. Mediterranean diet is the healthiest  But avoid  Processed carbs and sweets.   Plan preventive  and labs with     Bmp,  lfts  Lipids and cbc diff, hg  a1c .TSH In September     Lab Results  Component Value Date   WBC 6.2 07/02/2015   HGB  13.7 07/02/2015   HCT 40.2 07/02/2015   PLT 210.0 07/02/2015   GLUCOSE 150* 07/02/2015   CHOL 158 07/02/2015   TRIG 115.0 07/02/2015  HDL 61.60 07/02/2015   LDLDIRECT 173.9 11/09/2013   LDLCALC 73 07/02/2015   ALT 19 07/02/2015   AST 19 07/02/2015   NA 140 07/02/2015   K 4.8 07/02/2015   CL 105 07/02/2015   CREATININE 0.66 07/02/2015   BUN 12 07/02/2015   CO2 27 07/02/2015   TSH 1.57 07/02/2015   INR 0.93 08/31/2013   HGBA1C 7.0* 01/14/2016   MICROALBUR 1.9 07/02/2015        Jazzlene Huot K. Coleson Kant M.D.  Health Maintenance Due  Topic Date Due  . Hepatitis C Screening  Oct 01, 1946  . OPHTHALMOLOGY EXAM  05/25/2015

## 2016-01-17 ENCOUNTER — Encounter: Payer: Self-pay | Admitting: Internal Medicine

## 2016-01-17 ENCOUNTER — Ambulatory Visit (INDEPENDENT_AMBULATORY_CARE_PROVIDER_SITE_OTHER): Payer: Medicare Other | Admitting: Internal Medicine

## 2016-01-17 VITALS — BP 110/80 | HR 76 | Temp 98.9°F | Wt 194.0 lb

## 2016-01-17 DIAGNOSIS — Z789 Other specified health status: Secondary | ICD-10-CM

## 2016-01-17 DIAGNOSIS — I10 Essential (primary) hypertension: Secondary | ICD-10-CM | POA: Diagnosis not present

## 2016-01-17 DIAGNOSIS — Z79899 Other long term (current) drug therapy: Secondary | ICD-10-CM

## 2016-01-17 DIAGNOSIS — Z634 Disappearance and death of family member: Secondary | ICD-10-CM

## 2016-01-17 DIAGNOSIS — Z853 Personal history of malignant neoplasm of breast: Secondary | ICD-10-CM | POA: Diagnosis not present

## 2016-01-17 DIAGNOSIS — E119 Type 2 diabetes mellitus without complications: Secondary | ICD-10-CM | POA: Diagnosis not present

## 2016-01-17 MED ORDER — SERTRALINE HCL 25 MG PO TABS: 25.0000 mg | ORAL_TABLET | Freq: Every day | ORAL | Status: DC

## 2016-01-17 NOTE — Progress Notes (Signed)
Pre visit review using our clinic review tool, if applicable. No additional management support is needed unless otherwise documented below in the visit note. 

## 2016-01-17 NOTE — Patient Instructions (Addendum)
Intensify lifestyle interventions. Mediterranean diet is the healthiest  But avoid  Processed carbs and sweets.   Plan preventive  and labs with     Bmp,  lfts  Lipids and cbc diff, hg  a1c .TSH In September     Lab Results  Component Value Date   WBC 6.2 07/02/2015   HGB 13.7 07/02/2015   HCT 40.2 07/02/2015   PLT 210.0 07/02/2015   GLUCOSE 150* 07/02/2015   CHOL 158 07/02/2015   TRIG 115.0 07/02/2015   HDL 61.60 07/02/2015   LDLDIRECT 173.9 11/09/2013   LDLCALC 73 07/02/2015   ALT 19 07/02/2015   AST 19 07/02/2015   NA 140 07/02/2015   K 4.8 07/02/2015   CL 105 07/02/2015   CREATININE 0.66 07/02/2015   BUN 12 07/02/2015   CO2 27 07/02/2015   TSH 1.57 07/02/2015   INR 0.93 08/31/2013   HGBA1C 7.0* 01/14/2016   MICROALBUR 1.9 07/02/2015

## 2016-01-24 ENCOUNTER — Other Ambulatory Visit: Payer: Medicare Other

## 2016-01-30 DIAGNOSIS — H2511 Age-related nuclear cataract, right eye: Secondary | ICD-10-CM | POA: Diagnosis not present

## 2016-01-30 DIAGNOSIS — H269 Unspecified cataract: Secondary | ICD-10-CM | POA: Diagnosis not present

## 2016-02-15 ENCOUNTER — Ambulatory Visit: Payer: Medicare Other | Admitting: Internal Medicine

## 2016-03-05 DIAGNOSIS — N898 Other specified noninflammatory disorders of vagina: Secondary | ICD-10-CM | POA: Diagnosis not present

## 2016-03-05 DIAGNOSIS — N760 Acute vaginitis: Secondary | ICD-10-CM | POA: Diagnosis not present

## 2016-03-05 DIAGNOSIS — R300 Dysuria: Secondary | ICD-10-CM | POA: Diagnosis not present

## 2016-03-11 ENCOUNTER — Other Ambulatory Visit: Payer: Self-pay | Admitting: *Deleted

## 2016-03-11 MED ORDER — METFORMIN HCL ER 500 MG PO TB24: ORAL_TABLET | ORAL | Status: DC

## 2016-03-11 NOTE — Telephone Encounter (Signed)
Sent to the pharmacy by e-scribe.  Pt has upcoming CPX on 07/14/16.

## 2016-04-17 ENCOUNTER — Other Ambulatory Visit: Payer: Self-pay | Admitting: Internal Medicine

## 2016-04-17 MED ORDER — ROSUVASTATIN CALCIUM 10 MG PO TABS: 10.0000 mg | ORAL_TABLET | Freq: Every day | ORAL | Status: DC

## 2016-04-17 MED ORDER — TRAZODONE HCL 50 MG PO TABS: ORAL_TABLET | ORAL | Status: DC

## 2016-04-17 MED ORDER — ALPRAZOLAM 0.25 MG PO TABS: ORAL_TABLET | ORAL | Status: DC

## 2016-04-17 NOTE — Telephone Encounter (Signed)
trazadone can refill x 3 months   crestor x  3 months    Refill x 1  alprazolam 30   No refills

## 2016-04-17 NOTE — Telephone Encounter (Signed)
Pharmacy calling pt out of medication pt need trazodone 50 mg #30, alprazolam .25 mg  #30 and Crestor 10 mg  #30   Pharm:  [facility]

## 2016-04-17 NOTE — Telephone Encounter (Signed)
Rx for Alprazolam called into pharmacy other 2 Rx's sent electronically.

## 2016-04-17 NOTE — Telephone Encounter (Signed)
Pt called and state that she is out of her medication.

## 2016-04-18 ENCOUNTER — Other Ambulatory Visit: Payer: Self-pay | Admitting: *Deleted

## 2016-04-28 ENCOUNTER — Encounter: Payer: Self-pay | Admitting: Internal Medicine

## 2016-04-28 ENCOUNTER — Ambulatory Visit (INDEPENDENT_AMBULATORY_CARE_PROVIDER_SITE_OTHER): Payer: Medicare Other | Admitting: Internal Medicine

## 2016-04-28 VITALS — BP 110/76 | HR 78 | Ht 64.0 in | Wt 195.2 lb

## 2016-04-28 DIAGNOSIS — G4733 Obstructive sleep apnea (adult) (pediatric): Secondary | ICD-10-CM | POA: Diagnosis not present

## 2016-04-28 DIAGNOSIS — J310 Chronic rhinitis: Secondary | ICD-10-CM

## 2016-04-28 MED ORDER — AZELASTINE-FLUTICASONE 137-50 MCG/ACT NA SUSP: 1.0000 | Freq: Every day | NASAL | Status: DC

## 2016-04-28 NOTE — Assessment & Plan Note (Signed)
Increased complaints of nasal pressure and postnasal drainage may relate to increase CPAP pressure. Plan-reduce pressure to 11. We discussed saline nasal rinse and saline gel. Sample Dymista for trial

## 2016-04-28 NOTE — Patient Instructions (Signed)
Order- [facility]ME [facility]- Download says CPAP set on 14- Please reduce to 11, continue mask of choice, humidifier, supplies, AirView    Dx OSA  Sample Dymista nasal spray    1-2 puffs each nostril once daily at bedtime  Consider trying an otc nasal saline rinse, and possibly a nasal saline gel when needed for dry, stuffy nose

## 2016-04-28 NOTE — Assessment & Plan Note (Signed)
Not sure how CPAP got to be set on 14 instead of 11. The higher pressure may explain her complaints of retro-orbital headache in the mornings. She has a chronic pattern of nasal congestion and postnasal drip but it does not sound as if she has active sinusitis now. Plan-DME to reduce CPAP pressure to 11

## 2016-04-28 NOTE — Progress Notes (Signed)
10/14/12-- 56 yoF never smoker here with husband. Hx OSA/ CPAP, complicated by history breast cancer, hypertension, allergic rhinitis           Here with husband follows for : last seen in 2010. pt wears mask 7 days week approx 8 hours . denies any mask or pressure issues.  NPSG 47.7/ hr Wearing CPAP 11/[facility] all night every night and says she can't sleep without it. She only started wearing CPAP last year after recovering from breast cancer surgery and reconstruction.  Now pending TKR  11/21/13- 66 yoF never smoker here with husband. Hx OSA/ CPAP, complicated by history breast cancer, hypertension, allergic rhinitis           Here with husband Wears cpap 11/ [facility] nightly, 7.5-8 hours nighlty..  Has problems wearing mask, adjusting constantly through the night.  states it slides and doesn't fit well.  Nose gets stuffy.  02/15/15- 22 yoF never smoker here with husband. Hx OSA/ CPAP, complicated by history breast cancer, hypertension, allergic rhinitis           Here with husband  cpap 11/ [facility] nightly-FOLLOWS FOR: Wears CPAP for about 7-8 hours nightly; DME is [facility]. No supplies needed at this time. She likes her nasal pillows mask better than previous designs. Noting increased postnasal drainage and stuffiness but feels allergic rhinitis and ear discomforts are improved.  04/28/2016-70 year old female never smoker here with husband. History OSA/CPAP, complicated by history breast cancer, HBP, allergic rhinitis  cpap 11/ [facility]  FOLLOW FOR:  doing ok on CPAP.  having sinus issues, a lot of sinus drainage in the mornings.     Here with her husband confirms she is not snoring through CPAP. Download shows excellent compliance and control but indicates pressure at 14 CWP. She and we thought pressure was at 11. She has been having or trouble with pressure discomfort/headache and retro-orbital area and possibly that reflects the inadvertent increase in CPAP pressure. Bothersome morning nasal  congestion nasal discharge is not purulent. Tends to get sinus infections in the winter with colds.  ROS-see HPI Constitutional:   No-   weight loss, night sweats, fevers, chills, fatigue, lassitude. HEENT:   No-  headaches, difficulty swallowing, tooth/dental problems, sore throat,       No-  sneezing, itching, ear ache, +nasal congestion, +post nasal drip,  CV:  No-   chest pain, orthopnea, PND, swelling in lower extremities, anasarca, dizziness, palpitations Resp: No-   shortness of breath with exertion or at rest.              No-   productive cough,  No non-productive cough,  No- coughing up of blood.              No-   change in color of mucus.  No- wheezing.   Skin: No-   rash or lesions. GI:  No-   heartburn, indigestion, abdominal pain, nausea, vomiting,  GU:  MS:  + joint pain or swelling.   Neuro-     nothing unusual Psych:  No- change in mood or affect. No depression or anxiety.  No memory loss.  OBJ- Physical Exam General- Alert, Oriented, Affect-appropriate, Distress- none acute, + overweight Skin- rash-none, lesions- none, excoriation- none Lymphadenopathy- none Head- atraumatic            Eyes- Gross vision intact, PERRLA, conjunctivae and secretions clear            Ears- Hearing, canals-normal  Nose- + prominent turbinates, Septal dev +mild, no-mucus, polyps, erosion, perforation             Throat- Mallampati III-IV, mucosa clear , drainage- none, tonsils- atrophic, own teeth Neck- flexible , trachea midline, no stridor , thyroid nl, carotid no bruit Chest - symmetrical excursion , unlabored           Heart/CV- RRR , no murmur , no gallop  , no rub, nl s1 s2                           - JVD- none , edema- none, stasis changes- none, varices- none           Lung- clear to P&A, wheeze- none, cough- none , dullness-none, rub- none           Chest wall-  Abd-  Br/ Gen/ Rectal- Not done, not indicated Extrem- cyanosis- none, clubbing, none, atrophy- none,  strength- nl Neuro- grossly intact to observation

## 2016-05-02 DIAGNOSIS — H40013 Open angle with borderline findings, low risk, bilateral: Secondary | ICD-10-CM | POA: Diagnosis not present

## 2016-05-02 DIAGNOSIS — E119 Type 2 diabetes mellitus without complications: Secondary | ICD-10-CM | POA: Diagnosis not present

## 2016-05-02 DIAGNOSIS — H2512 Age-related nuclear cataract, left eye: Secondary | ICD-10-CM | POA: Diagnosis not present

## 2016-05-02 DIAGNOSIS — Z961 Presence of intraocular lens: Secondary | ICD-10-CM | POA: Diagnosis not present

## 2016-05-02 LAB — HM DIABETES EYE EXAM

## 2016-05-06 ENCOUNTER — Encounter: Payer: Self-pay | Admitting: Internal Medicine

## 2016-05-06 DIAGNOSIS — C50912 Malignant neoplasm of unspecified site of left female breast: Secondary | ICD-10-CM | POA: Diagnosis not present

## 2016-05-06 DIAGNOSIS — I10 Essential (primary) hypertension: Secondary | ICD-10-CM | POA: Diagnosis not present

## 2016-05-06 DIAGNOSIS — Z9012 Acquired absence of left breast and nipple: Secondary | ICD-10-CM | POA: Diagnosis not present

## 2016-05-06 DIAGNOSIS — Z853 Personal history of malignant neoplasm of breast: Secondary | ICD-10-CM | POA: Diagnosis not present

## 2016-05-06 DIAGNOSIS — Z170 Estrogen receptor positive status [ER+]: Secondary | ICD-10-CM | POA: Diagnosis not present

## 2016-05-06 DIAGNOSIS — T386X5A Adverse effect of antigonadotrophins, antiestrogens, antiandrogens, not elsewhere classified, initial encounter: Secondary | ICD-10-CM | POA: Diagnosis not present

## 2016-05-06 DIAGNOSIS — Z79811 Long term (current) use of aromatase inhibitors: Secondary | ICD-10-CM | POA: Diagnosis not present

## 2016-05-06 DIAGNOSIS — M818 Other osteoporosis without current pathological fracture: Secondary | ICD-10-CM | POA: Diagnosis not present

## 2016-05-06 DIAGNOSIS — Z923 Personal history of irradiation: Secondary | ICD-10-CM | POA: Diagnosis not present

## 2016-05-06 DIAGNOSIS — M810 Age-related osteoporosis without current pathological fracture: Secondary | ICD-10-CM | POA: Diagnosis not present

## 2016-05-06 DIAGNOSIS — Z7984 Long term (current) use of oral hypoglycemic drugs: Secondary | ICD-10-CM | POA: Diagnosis not present

## 2016-05-07 ENCOUNTER — Other Ambulatory Visit: Payer: Self-pay | Admitting: Family Medicine

## 2016-05-07 MED ORDER — LOSARTAN POTASSIUM 50 MG PO TABS: 50.0000 mg | ORAL_TABLET | Freq: Every day | ORAL | 0 refills | Status: DC

## 2016-05-07 NOTE — Telephone Encounter (Signed)
Sent to the pharmacy by e-scribe for 90 days.  Pt has upcoming appt on 07/14/16.

## 2016-07-14 ENCOUNTER — Encounter: Payer: Medicare Other | Admitting: Internal Medicine

## 2016-07-23 NOTE — Progress Notes (Signed)
Pre visit review using our clinic review tool, if applicable. No additional management support is needed unless otherwise documented below in the visit note.  Chief Complaint  Patient presents with  . Medicare Wellness    HPI: Chelsey [name] 70 y.o. comes in today for Preventive Medicare wellness visit  And Chronic disease management .  DM:  No change meds  No new neuro sx.   Breast cancer  : seen at [facility]  Needs dexa and mammo fu  On arimidex    On prolia now for osteoporosis h on calcium and vit d but has lwo "calcium below 30 and suppose dto be above 30"  Anxiety inc recently many happenings taking 1/2 alprazocass in am .sertraline  26 wants to get off but gets sx when stops ,interested in other options  Using trazadone as needed for sleep.   To have cataracts done next week   OSA  Under care  Cpap.  Gi only taking nexium as needed and not daily     Health Maintenance  Topic Date Due  . OPHTHALMOLOGY EXAM  05/25/2015  . FOOT EXAM  07/01/2016  . HEMOGLOBIN A1C  07/15/2016  . ZOSTAVAX  07/23/2017 (Originally 04/17/2007)  . Hepatitis C Screening  07/23/2017 (Originally 03/04/1946)  . PNA vac Low Risk Adult (2 of 2 - PPSV23) 07/23/2017 (Originally 11/24/2014)  . MAMMOGRAM  10/10/2017  . TETANUS/TDAP  10/03/2020  . COLONOSCOPY  08/18/2023  . INFLUENZA VACCINE  Addressed  . DEXA SCAN  Completed   Health Maintenance Review LIFESTYLE:  TAD: no tobacco  etoh  1-2      Per day  Sugar beverages: no sodas.   No  Sleep:  7-8  Some awaking   Taking trazadone as needed  HH of 2  Pets dog .   Work  About 50 hours  ...   MEDICARE DOCUMENT QUESTIONS  TO SCAN   Hearing: ok  Vision:  No limitations at present . Last eye check UTD  Safety:  Has smoke detector and wears seat belts.  No firearms. No excess sun exposure. Sees dentist regularly.  Falls:   Advance directive :  Reviewed  Has one.  Memory: Felt to be good  , no concern from her or her family.  Depression: No  anhedonia unusual crying or depressive symptoms  Nutrition: Eats well balanced diet; adequate calcium and vitamin D supplements  . No swallowing chewing problems.  Injury: no major injuries in the last six months.  Other healthcare providers:  Reviewed today .  Social:  Lives with spouse married. No pets.   Preventive parameters: up-to-date  Reviewed   ADLS:   There are no problems or need for assistance  driving, feeding, obtaining food, dressing, toileting and bathing, managing money using phone. She is independent.    ROS:  Gets itchy skin with stress no new rash GEN/ HEENT: No fever, significant weight changes sweats headaches vision problems hearing changes, CV/ PULM; No chest pain shortness of breath cough, syncope,edema  change in exercise tolerance. GI /GU: No adominal pain, vomiting, change in bowel habits. No blood in the stool. No significant GU symptoms. SKIN/HEME: ,no acute skin rashes suspicious lesions or bleeding. No lymphadenopathy, nodules, masses.  NEURO/ PSYCH:  No  New neurologic signs such as weakness numbness. No depression  IMM/ Allergy: No unusual infections.  Allergy .   REST of 12 system review negative except as per HPI   Past Medical History:  Diagnosis Date  . Allergy   .  Aphthous ulcer of mouth 2010   related to chemotherapy   . Breast cancer ([place])   . Carpal tunnel syndrome of right wrist   . Colitis   . Colon polyp   . Diabetes mellitus   . Diverticulosis   . GERD (gastroesophageal reflux disease)   . Hyperlipidemia   . Hypertension   . Lymphedema 08-31-13   left arm  . Osteoarthritis   . Pneumonia 07/28/2011  . Sinusitis   . Sleep apnea    setting of 10  . Squamous cell carcinoma    left leg    Family History  Problem Relation Age of Onset  . Alzheimer's disease Mother   . Sleep apnea Mother   . Diabetes Mother   . Hypertension Mother   . Hyperlipidemia Mother   . Breast cancer Mother 5    triple negative  . Anxiety  disorder Daughter   . Hypertension Son     pulmonary  . Congenital heart disease Son 0    TGA  died age 59     Social History   Social History  . Marital status: Married    Spouse name: N/A  . Number of children: 3  . Years of education: N/A   Occupational History  . 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 Smoking status: Never Smoker  . Smokeless tobacco: Never Used  . Alcohol use 3.6 oz/week    6 Glasses of wine per week     Comment: 1-2 glasses every other night  . Drug use: No  . Sexual activity: Yes    Birth control/ protection: Post-menopausal, Surgical   Other Topics Concern  . 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   Passed away sept 36 15  Age 52    Outpatient Encounter Prescriptions as of 07/24/2016  Medication Sig  . ALPRAZolam (XANAX) 0.25 MG tablet Take 1 tablet (0.25 mg total) by mouth at bedtime as needed for anxiety.  Marland Kitchen anastrozole (ARIMIDEX) 1 MG tablet Take 1 tablet (1 mg total) by mouth daily.  Marland Kitchen denosumab (PROLIA) 60 MG/ML SOLN injection Inject 60 mg into the skin every 6 (six) months. Administer in upper arm, thigh, or abdomen  . esomeprazole (NEXIUM) 40 MG capsule Take 40 mg by mouth daily at 12 noon. Takes bedtime  . losartan (COZAAR) 50 MG tablet Take 1 tablet (50 mg total) by mouth daily.  . metFORMIN (GLUCOPHAGE-XR) 500 MG 24 hr tablet take 2 tablets by mouth twice a day with meals  . rosuvastatin (CRESTOR) 10 MG tablet Take 1 tablet (10 mg total) by mouth daily.  . sertraline (ZOLOFT) 25 MG tablet Take 1 tablet (25 mg total) by mouth daily.  . traZODone (DESYREL) 50 MG tablet take 1/2 to 1 tablet by mouth at bedtime if needed for sleep  . [DISCONTINUED] ALPRAZolam (XANAX) 0.25 MG tablet take 1 tablet by mouth at bedtime if needed for anxiety  . [DISCONTINUED] traZODone  (DESYREL) 50 MG tablet take 1/2 to 1 tablet by mouth at bedtime if needed for sleep  . [DISCONTINUED] Azelastine-Fluticasone 137-50 MCG/ACT SUSP Place 1-2 puffs into the nose at bedtime.  . [DISCONTINUED] cycloSPORINE (RESTASIS) 0.05 % ophthalmic emulsion Place 1 drop into both eyes daily. Reported on 01/17/2016   No  facility-administered encounter medications on file as of 07/24/2016.     EXAM:  BP 112/60 (BP Location: Right Arm, Patient Position: Sitting, Cuff Size: Large)   Temp 98.5 F (36.9 C) (Oral)   Ht 5\' 3"  (1.6 m)   Wt 193 lb 3.2 oz (87.6 kg)   BMI 34.22 kg/m   Body mass index is 34.22 kg/m.  Physical Exam: Vital signs reviewed [id number] is a well-developed well-nourished alert cooperative   who appears stated age in no acute distress.  HEENT: normocephalic atraumatic , Eyes: PERRL EOM's full, conjunctiva clear, Nares: paten,t no deformity discharge or tenderness., Ears: no deformity EAC's clear TMs with normal landmarks. Mouth: clear OP, no lesions, edema.  Moist mucous membranes. Dentition in adequate repair. NECK: supple without masses, thyromegaly or bruits. CHEST/PULM:  Clear to auscultation and percussion breath sounds equal no wheeze , rales or rhonchi.  Axilla clear breast exam not done CV: PMI is nondisplaced, S1 S2 no gallops, murmurs, rubs. Peripheral pulses are full without delay. .  ABDOMEN: Bowel sounds normal nontender  No guard or rebound, no hepato splenomegal no CVA tenderness.   Extremtities:  No clubbing cyanosis or edema, no acute joint swelling or redness no focal atrophy NEURO:  Oriented x3, cranial nerves 3-12 appear to be intact, no obvious focal weakness,gait within normal limits  SKIN: No acute rashes normal turgor, color, no bruising or petechiae. PSYCH: Oriented, good eye contact, no obvious depression anxiety, cognition and judgment appear normal. LN: no cervical axillary inguinal adenopathy 21No noted deficits in memory, attention, and  speech. Wt Readings from Last 3 Encounters:  07/24/16 193 lb 3.2 oz (87.6 kg)  04/28/16 195 lb 3.2 oz (88.5 kg)  01/17/16 194 lb (88 kg)   Diabetic Foot Exam - Simple   Simple Foot Form Diabetic Foot exam was performed with the following findings:  Yes 07/24/2016  9:50 AM  Visual Inspection No deformities, no ulcerations, no other skin breakdown bilaterally:  Yes Sensation Testing Intact to touch and monofilament testing bilaterally:  Yes Pulse Check Posterior Tibialis and Dorsalis pulse intact bilaterally:  Yes Comments      ASSESSMENT AND PLAN:  Discussed the following assessment and plan:  Medicare annual wellness visit, subsequent  Type 2 diabetes mellitus without complication, without long-term current use of insulin ([place]) - Plan: Basic metabolic panel, CBC with Differential/Platelet, Hemoglobin A1c, Hepatic function panel, Lipid panel, TSH, Microalbumin / creatinine urine ratio, VITAMIN D 25 Hydroxy (Vit-D Deficiency, Fractures)  Essential hypertension - Plan: Basic metabolic panel, CBC with Differential/Platelet, Hemoglobin A1c, Hepatic function panel, Lipid panel, TSH, Microalbumin / creatinine urine ratio, VITAMIN D 25 Hydroxy (Vit-D Deficiency, Fractures)  Medication management - Plan: Basic metabolic panel, CBC with Differential/Platelet, Hemoglobin A1c, Hepatic function panel, Lipid panel, TSH, Microalbumin / creatinine urine ratio, VITAMIN D 25 Hydroxy (Vit-D Deficiency, Fractures)  HX: breast cancer  OSA on CPAP  Hyperlipidemia, unspecified hyperlipidemia type - Plan: Basic metabolic panel, CBC with Differential/Platelet, Hemoglobin A1c, Hepatic function panel, Lipid panel, TSH, Microalbumin / creatinine urine ratio, VITAMIN D 25 Hydroxy (Vit-D Deficiency, Fractures)  Need for prophylactic vaccination and inoculation against influenza - Plan: Flu vaccine HIGH DOSE PF (Fluzone High dose)  Osteoporosis without current pathological fracture, unspecified osteoporosis  type - on prolia per [facility] team - Plan: Basic metabolic panel, CBC with Differential/Platelet, Hemoglobin A1c, Hepatic function panel, Lipid panel, TSH, Microalbumin / creatinine urine ratio, VITAMIN D 25 Hydroxy (Vit-D Deficiency, Fractures)  Adjustment disorder with anxious mood - some reactive cautious use  of alpraz   ocass use not interested in other meds at this time benefit more than risk at this time - Plan: Basic metabolic panel, CBC with Differential/Platelet, Hemoglobin A1c, Hepatic function panel, Lipid panel, TSH, Microalbumin / creatinine urine ratio, VITAMIN D 25 Hydroxy (Vit-D Deficiency, Fractures)  Estrogen deficiency - Plan: Basic metabolic panel, CBC with Differential/Platelet, Hemoglobin A1c, Hepatic function panel, Lipid panel, TSH, Microalbumin / creatinine urine ratio, VITAMIN D 25 Hydroxy (Vit-D Deficiency, Fractures)  Vitamin D deficiency - Plan: Basic metabolic panel, CBC with Differential/Platelet, Hemoglobin A1c, Hepatic function panel, Lipid panel, TSH, Microalbumin / creatinine urine ratio, VITAMIN D 25 Hydroxy (Vit-D Deficiency, Fractures)  Malignant neoplasm of upper-outer quadrant of left female breast, unspecified estrogen receptor status ([place]) Order for dexa given to patient  She will get a mammogram  Patient Care Team: Burnis Medin, MD as PCP - General Gaynelle Arabian, MD as Attending Physician (Orthopedic Surgery) Lafayette Dragon, MD (Inactive) as Consulting Physician (Gastroenterology) Harvie Heck, MD as Physician Assistant (Internal Medicine) Deneise Lever, MD as Consulting Physician (Pulmonary Disease) Clent Jacks, MD as Consulting Physician (Ophthalmology)  Patient Instructions  Will notify you  of labs when available.   dexa and mammogram to be done .   Continue lifestyle intervention healthy eating and exercise .  depending on labs  ROV in 6 months  Can do hg a1c at that visit .    Standley Brooking. Keianna Signer M.D.

## 2016-07-24 ENCOUNTER — Ambulatory Visit (INDEPENDENT_AMBULATORY_CARE_PROVIDER_SITE_OTHER): Payer: Medicare Other | Admitting: Internal Medicine

## 2016-07-24 ENCOUNTER — Encounter: Payer: Self-pay | Admitting: Internal Medicine

## 2016-07-24 VITALS — BP 112/60 | Temp 98.5°F | Ht 63.0 in | Wt 193.2 lb

## 2016-07-24 DIAGNOSIS — I10 Essential (primary) hypertension: Secondary | ICD-10-CM

## 2016-07-24 DIAGNOSIS — C50412 Malignant neoplasm of upper-outer quadrant of left female breast: Secondary | ICD-10-CM

## 2016-07-24 DIAGNOSIS — Z79899 Other long term (current) drug therapy: Secondary | ICD-10-CM | POA: Diagnosis not present

## 2016-07-24 DIAGNOSIS — E119 Type 2 diabetes mellitus without complications: Secondary | ICD-10-CM | POA: Diagnosis not present

## 2016-07-24 DIAGNOSIS — E2839 Other primary ovarian failure: Secondary | ICD-10-CM

## 2016-07-24 DIAGNOSIS — Z9989 Dependence on other enabling machines and devices: Secondary | ICD-10-CM

## 2016-07-24 DIAGNOSIS — Z23 Encounter for immunization: Secondary | ICD-10-CM | POA: Diagnosis not present

## 2016-07-24 DIAGNOSIS — F4322 Adjustment disorder with anxiety: Secondary | ICD-10-CM

## 2016-07-24 DIAGNOSIS — E785 Hyperlipidemia, unspecified: Secondary | ICD-10-CM | POA: Diagnosis not present

## 2016-07-24 DIAGNOSIS — Z853 Personal history of malignant neoplasm of breast: Secondary | ICD-10-CM | POA: Diagnosis not present

## 2016-07-24 DIAGNOSIS — G4733 Obstructive sleep apnea (adult) (pediatric): Secondary | ICD-10-CM

## 2016-07-24 DIAGNOSIS — Z0000 Encounter for general adult medical examination without abnormal findings: Secondary | ICD-10-CM | POA: Diagnosis not present

## 2016-07-24 DIAGNOSIS — M810 Age-related osteoporosis without current pathological fracture: Secondary | ICD-10-CM

## 2016-07-24 DIAGNOSIS — E559 Vitamin D deficiency, unspecified: Secondary | ICD-10-CM

## 2016-07-24 LAB — HEPATIC FUNCTION PANEL
ALK PHOS: 44 U/L (ref 39–117)
ALT: 24 U/L (ref 0–35)
AST: 22 U/L (ref 0–37)
Albumin: 4.4 g/dL (ref 3.5–5.2)
BILIRUBIN DIRECT: 0.1 mg/dL (ref 0.0–0.3)
BILIRUBIN TOTAL: 0.7 mg/dL (ref 0.2–1.2)
TOTAL PROTEIN: 7 g/dL (ref 6.0–8.3)

## 2016-07-24 LAB — CBC WITH DIFFERENTIAL/PLATELET
BASOS ABS: 0 10*3/uL (ref 0.0–0.1)
Basophils Relative: 0.4 % (ref 0.0–3.0)
EOS ABS: 0.1 10*3/uL (ref 0.0–0.7)
Eosinophils Relative: 1.6 % (ref 0.0–5.0)
HEMATOCRIT: 40.3 % (ref 36.0–46.0)
Hemoglobin: 13.7 g/dL (ref 12.0–15.0)
LYMPHS PCT: 27.9 % (ref 12.0–46.0)
Lymphs Abs: 2.1 10*3/uL (ref 0.7–4.0)
MCHC: 34 g/dL (ref 30.0–36.0)
MCV: 90.9 fl (ref 78.0–100.0)
MONOS PCT: 7.3 % (ref 3.0–12.0)
Monocytes Absolute: 0.5 10*3/uL (ref 0.1–1.0)
NEUTROS PCT: 62.8 % (ref 43.0–77.0)
Neutro Abs: 4.6 10*3/uL (ref 1.4–7.7)
PLATELETS: 203 10*3/uL (ref 150.0–400.0)
RBC: 4.43 Mil/uL (ref 3.87–5.11)
RDW: 13.9 % (ref 11.5–15.5)
WBC: 7.4 10*3/uL (ref 4.0–10.5)

## 2016-07-24 LAB — LIPID PANEL
CHOL/HDL RATIO: 3
Cholesterol: 178 mg/dL (ref 0–200)
HDL: 67.3 mg/dL (ref >39.00)
LDL CALC: 91 mg/dL (ref 0–99)
NONHDL: 110.27
TRIGLYCERIDES: 95 mg/dL (ref 0.0–149.0)
VLDL: 19 mg/dL (ref 0.0–40.0)

## 2016-07-24 LAB — MICROALBUMIN / CREATININE URINE RATIO
CREATININE, U: 98 mg/dL
Microalb Creat Ratio: 0.7 mg/g (ref 0.0–30.0)
Microalb, Ur: <0.7 mg/dL (ref 0.0–1.9)

## 2016-07-24 LAB — BASIC METABOLIC PANEL
BUN: 12 mg/dL (ref 6–23)
CALCIUM: 10.9 mg/dL — AB (ref 8.4–10.5)
CO2: 30 mEq/L (ref 19–32)
CREATININE: 0.76 mg/dL (ref 0.40–1.20)
Chloride: 102 mEq/L (ref 96–112)
GFR: 80.14 mL/min (ref >60.00)
Glucose, Bld: 144 mg/dL — ABNORMAL HIGH (ref 70–99)
Potassium: 4.9 mEq/L (ref 3.5–5.1)
SODIUM: 140 meq/L (ref 135–145)

## 2016-07-24 LAB — TSH: TSH: 1.97 u[IU]/mL (ref 0.35–4.50)

## 2016-07-24 LAB — HEMOGLOBIN A1C: Hgb A1c MFr Bld: 6.9 % — ABNORMAL HIGH (ref 4.6–6.5)

## 2016-07-24 LAB — VITAMIN D 25 HYDROXY (VIT D DEFICIENCY, FRACTURES): VITD: 50.37 ng/mL (ref 30.00–100.00)

## 2016-07-24 MED ORDER — TRAZODONE HCL 50 MG PO TABS: ORAL_TABLET | ORAL | 2 refills | Status: DC

## 2016-07-24 MED ORDER — ALPRAZOLAM 0.25 MG PO TABS: 0.2500 mg | ORAL_TABLET | Freq: Every evening | ORAL | 0 refills | Status: DC | PRN

## 2016-07-24 NOTE — Patient Instructions (Signed)
Will notify you  of labs when available.   dexa and mammogram to be done .   Continue lifestyle intervention healthy eating and exercise .  depending on labs  ROV in 6 months  Can do hg a1c at that visit .

## 2016-07-27 ENCOUNTER — Other Ambulatory Visit: Payer: Self-pay | Admitting: Internal Medicine

## 2016-07-28 DIAGNOSIS — H2512 Age-related nuclear cataract, left eye: Secondary | ICD-10-CM | POA: Diagnosis not present

## 2016-07-30 DIAGNOSIS — H25812 Combined forms of age-related cataract, left eye: Secondary | ICD-10-CM | POA: Diagnosis not present

## 2016-07-30 DIAGNOSIS — H2512 Age-related nuclear cataract, left eye: Secondary | ICD-10-CM | POA: Diagnosis not present

## 2016-08-10 ENCOUNTER — Other Ambulatory Visit: Payer: Self-pay | Admitting: Internal Medicine

## 2016-08-12 NOTE — Telephone Encounter (Signed)
Sent to the pharmacy by e-scribe for 6 months.  Pt to return in 01/2017.

## 2016-09-12 ENCOUNTER — Encounter: Payer: Self-pay | Admitting: Family Medicine

## 2016-09-15 ENCOUNTER — Other Ambulatory Visit: Payer: Self-pay | Admitting: Family Medicine

## 2016-09-15 ENCOUNTER — Telehealth: Payer: Self-pay | Admitting: Internal Medicine

## 2016-09-15 DIAGNOSIS — E2839 Other primary ovarian failure: Secondary | ICD-10-CM

## 2016-09-15 DIAGNOSIS — M810 Age-related osteoporosis without current pathological fracture: Secondary | ICD-10-CM

## 2016-09-15 NOTE — Telephone Encounter (Signed)
Called and spoke to the pt.  Informed her that [facility] had [facility]  She should be contacted for an appointment.

## 2016-09-15 NOTE — Telephone Encounter (Signed)
Pt's husband stated that she was given a rx for a bone density scan when she was here before and he lost it, so he was wondering if another could be written. She needs to have it done before the end of the year. Please advise.

## 2016-09-16 ENCOUNTER — Ambulatory Visit (INDEPENDENT_AMBULATORY_CARE_PROVIDER_SITE_OTHER): Payer: Medicare Other | Admitting: Adult Health

## 2016-09-16 ENCOUNTER — Encounter: Payer: Self-pay | Admitting: Adult Health

## 2016-09-16 ENCOUNTER — Other Ambulatory Visit: Payer: Self-pay | Admitting: Internal Medicine

## 2016-09-16 DIAGNOSIS — G4733 Obstructive sleep apnea (adult) (pediatric): Secondary | ICD-10-CM | POA: Diagnosis not present

## 2016-09-16 DIAGNOSIS — J018 Other acute sinusitis: Secondary | ICD-10-CM | POA: Diagnosis not present

## 2016-09-16 MED ORDER — AZITHROMYCIN 250 MG PO TABS: ORAL_TABLET | ORAL | 0 refills | Status: AC

## 2016-09-16 NOTE — Patient Instructions (Signed)
Begin Zpack take as directed.  Mucinex DM Twice daily  As needed  Cough/congestion .  Saline nasal rinses and gel As needed   Order for CPAP download, new machine and nasal mask as requested.  follow up Dr. Annamaria Boots  In 6 months and As needed   Please contact office for sooner follow up if symptoms do not improve or worsen or seek emergency care

## 2016-09-16 NOTE — Assessment & Plan Note (Signed)
Controlled on CPAP   Plan  Patient Instructions  Order for CPAP download, new machine and nasal mask as requested.  follow up Dr. Annamaria Boots  In 6 months and As needed   Please contact office for sooner follow up if symptoms do not improve or worsen or seek emergency care

## 2016-09-16 NOTE — Progress Notes (Signed)
Subjective:    Patient ID: Chelsey [name], female    DOB: Aug 06, 1946, 70 y.o.   MRN: [id number]  HPI 70 yo female never smoker followed for OSA on CPAP   09/16/2016 Acute OV  Pt presents for an acute office visit.  Complains of sinus congestion , sinus pressure, drainage, teeth pain, cough/congestion , achy for 1 week  Taking advil otc.  Denies chest pain, orthopnea, edema or fever.   She is on CPAP At bedtime  . Wears it every night , cant sleep without it.  It is >5 yr old . Feels the machine is not working as well  Download was ordered. Would like new machine . Has spoken to [facility] and needs order sent to DME. Wants to try nasal mask . She has nasal pillows right now and irritating her now.  Travels a lot.    Past Medical History:  Diagnosis Date  . Allergy   . Aphthous ulcer of mouth 2010   related to chemotherapy   . Breast cancer ([place])   . Carpal tunnel syndrome of right wrist   . Colitis   . Colon polyp   . Diabetes mellitus   . Diverticulosis   . GERD (gastroesophageal reflux disease)   . Hyperlipidemia   . Hypertension   . Lymphedema 08-31-13   left arm  . Osteoarthritis   . Pneumonia 07/28/2011  . Sinusitis   . Sleep apnea    setting of 10  . Squamous cell carcinoma    left leg   Current Outpatient Prescriptions on File Prior to Visit  Medication Sig Dispense Refill  . ALPRAZolam (XANAX) 0.25 MG tablet Take 1 tablet (0.25 mg total) by mouth at bedtime as needed for anxiety. 30 tablet 0  . anastrozole (ARIMIDEX) 1 MG tablet Take 1 tablet (1 mg total) by mouth daily. 90 tablet 5  . denosumab (PROLIA) 60 MG/ML SOLN injection Inject 60 mg into the skin every 6 (six) months. Administer in upper arm, thigh, or abdomen    . losartan (COZAAR) 50 MG tablet take 1 tablet by mouth once daily 90 tablet 3  . metFORMIN (GLUCOPHAGE-XR) 500 MG 24 hr tablet take 2 tablets by mouth twice a day with meals 120 tablet 5  . rosuvastatin (CRESTOR) 10 MG tablet Take 1 tablet (10 mg  total) by mouth daily. 30 tablet 2  . sertraline (ZOLOFT) 25 MG tablet Take 1 tablet (25 mg total) by mouth daily. 90 tablet 3  . traZODone (DESYREL) 50 MG tablet take 1/2 to 1 tablet by mouth at bedtime if needed for sleep 30 tablet 2  . esomeprazole (NEXIUM) 40 MG capsule Take 40 mg by mouth daily at 12 noon. Takes bedtime     No current facility-administered medications on file prior to visit.       Review of Systems Constitutional:   No  weight loss, night sweats,  Fevers, chills, fatigue, or  lassitude.  HEENT:   No headaches,  Difficulty swallowing,  Tooth/dental problems, or  Sore throat,                No sneezing, itching, ear ache, nasal congestion, post nasal drip,   CV:  No chest pain,  Orthopnea, PND, swelling in lower extremities, anasarca, dizziness, palpitations, syncope.   GI  No heartburn, indigestion, abdominal pain, nausea, vomiting, diarrhea, change in bowel habits, loss of appetite, bloody stools.   Resp: No shortness of breath with exertion or at rest.  No excess  mucus, no productive cough,  No non-productive cough,  No coughing up of blood.  No change in color of mucus.  No wheezing.  No chest wall deformity  Skin: no rash or lesions.  GU: no dysuria, change in color of urine, no urgency or frequency.  No flank pain, no hematuria   MS:  No joint pain or swelling.  No decreased range of motion.  No back pain.  Psych:  No change in mood or affect. No depression or anxiety.  No memory loss.         Objective:   Physical Exam  Vitals:   09/16/16 0930  BP: 122/76  Pulse: 60  Temp: 98.5 F (36.9 C)  TempSrc: Oral  SpO2: 99%  Weight: 194 lb 9.6 oz (88.3 kg)  Height: 5\' 4"  (1.626 m)   GEN: A/Ox3; pleasant , NAD, well nourished    HEENT:  [place]/AT,  EACs-clear, TMs-wnl, NOSE-clear, THROAT-clear, no lesions, no postnasal drip or exudate noted.   NECK:  Supple w/ fair ROM; no JVD; normal carotid impulses w/o bruits; no thyromegaly or nodules palpated; no  lymphadenopathy.    RESP  Clear  P & A; w/o, wheezes/ rales/ or rhonchi. no accessory muscle use, no dullness to percussion  CARD:  RRR, no m/r/g  , no peripheral edema, pulses intact, no cyanosis or clubbing.  GI:   Soft & nt; nml bowel sounds; no organomegaly or masses detected.   Musco: Warm bil, no deformities or joint swelling noted.   Neuro: alert, no focal deficits noted.    Skin: Warm, no lesions or rashes  Akiel Fennell NP-C  [facility]  09/16/2016       Assessment & Plan:

## 2016-09-16 NOTE — Assessment & Plan Note (Signed)
Acute sinusitis   Plan:  Patient Instructions  Begin Zpack take as directed.  Mucinex DM Twice daily  As needed  Cough/congestion .  Saline nasal rinses and gel As needed   follow up Dr. Annamaria Boots  In 6 months and As needed   Please contact office for sooner follow up if symptoms do not improve or worsen or seek emergency care

## 2016-09-16 NOTE — Addendum Note (Signed)
Addended by: Doroteo Glassman D on: 09/16/2016 10:06 AM   Modules accepted: Orders

## 2016-09-18 NOTE — Telephone Encounter (Signed)
Sent to the pharmacy by e-scribe for 5 months.  Has upcoming appt on 01/22/17.  Had wellness on 07/24/16

## 2016-10-09 DIAGNOSIS — Z853 Personal history of malignant neoplasm of breast: Secondary | ICD-10-CM | POA: Diagnosis not present

## 2016-10-17 DIAGNOSIS — C50912 Malignant neoplasm of unspecified site of left female breast: Secondary | ICD-10-CM | POA: Diagnosis not present

## 2016-10-17 DIAGNOSIS — Z1289 Encounter for screening for malignant neoplasm of other sites: Secondary | ICD-10-CM | POA: Diagnosis not present

## 2016-10-17 DIAGNOSIS — R911 Solitary pulmonary nodule: Secondary | ICD-10-CM | POA: Diagnosis not present

## 2016-10-17 DIAGNOSIS — R918 Other nonspecific abnormal finding of lung field: Secondary | ICD-10-CM | POA: Diagnosis not present

## 2016-10-17 DIAGNOSIS — K769 Liver disease, unspecified: Secondary | ICD-10-CM | POA: Diagnosis not present

## 2016-10-20 IMAGING — MG MM SCREENING BREAST TOMO UNI R
4 series · 4 of 4 positions shown · non-contrast
Comparison: Previous exam(s).

CLINICAL DATA: Screening.

EXAM:
DIGITAL SCREENING UNILATERAL RIGHT MAMMOGRAM WITH TOMO AND CAD

[R MLO]
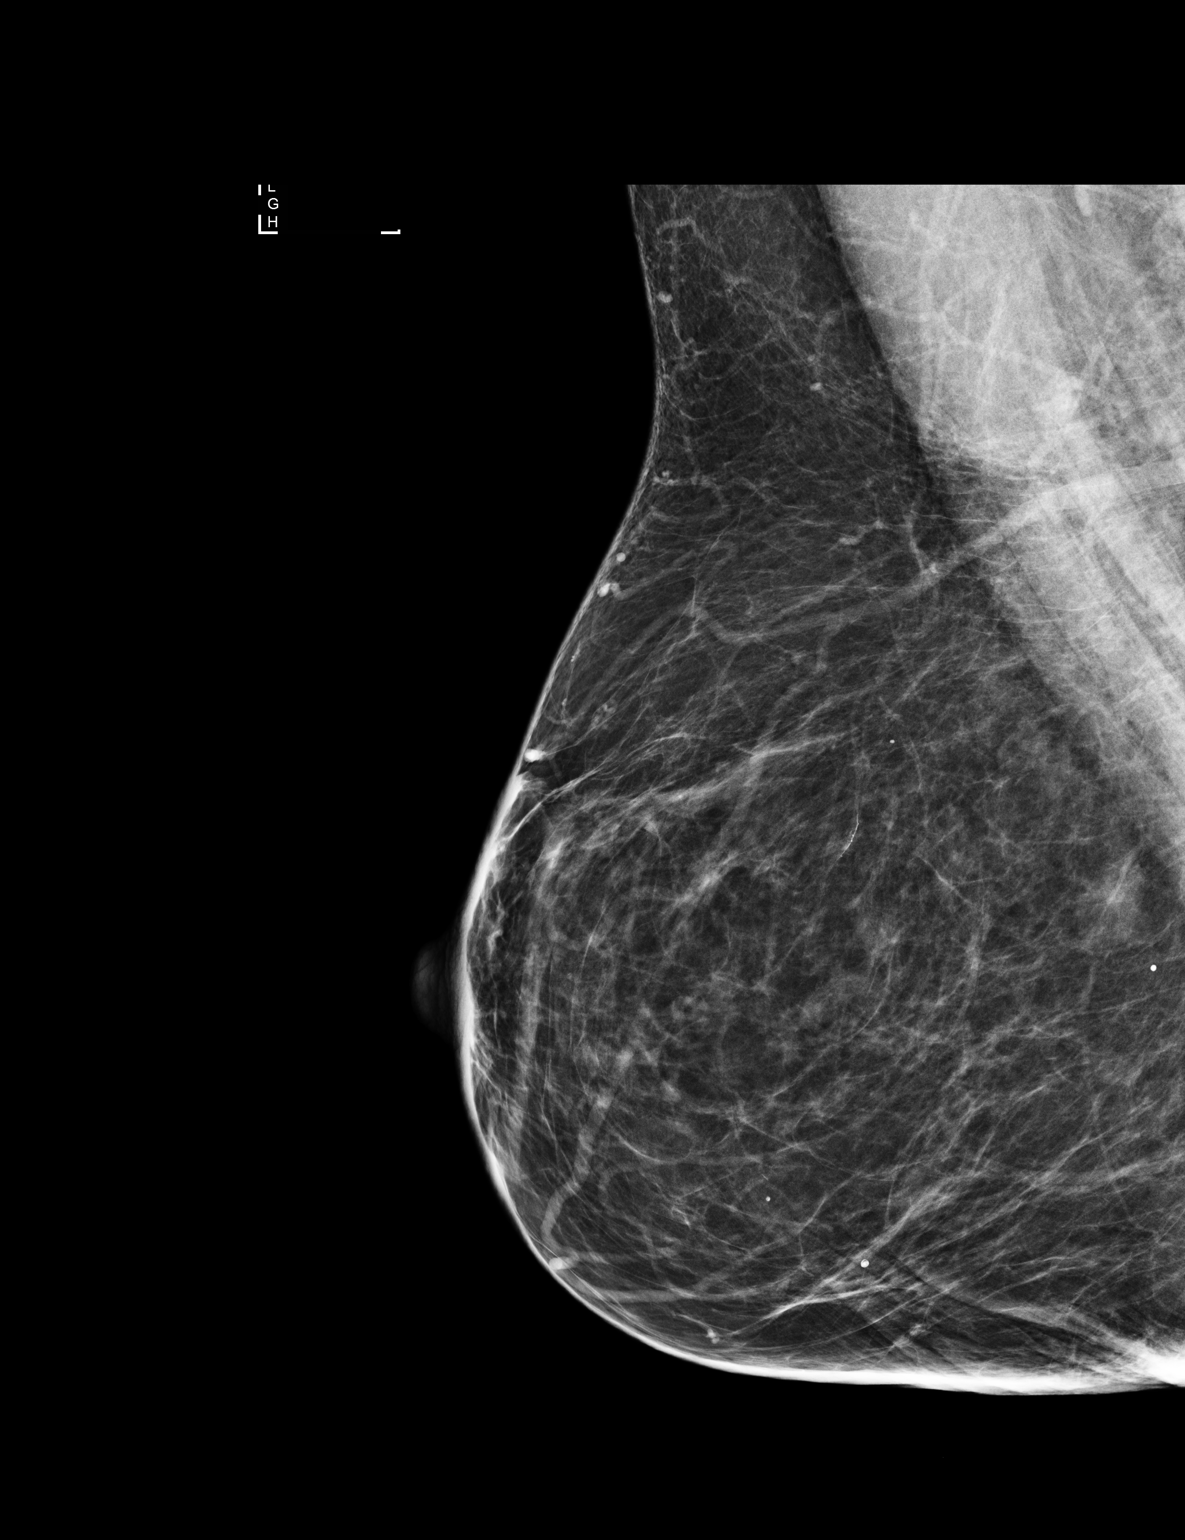

[R CC]
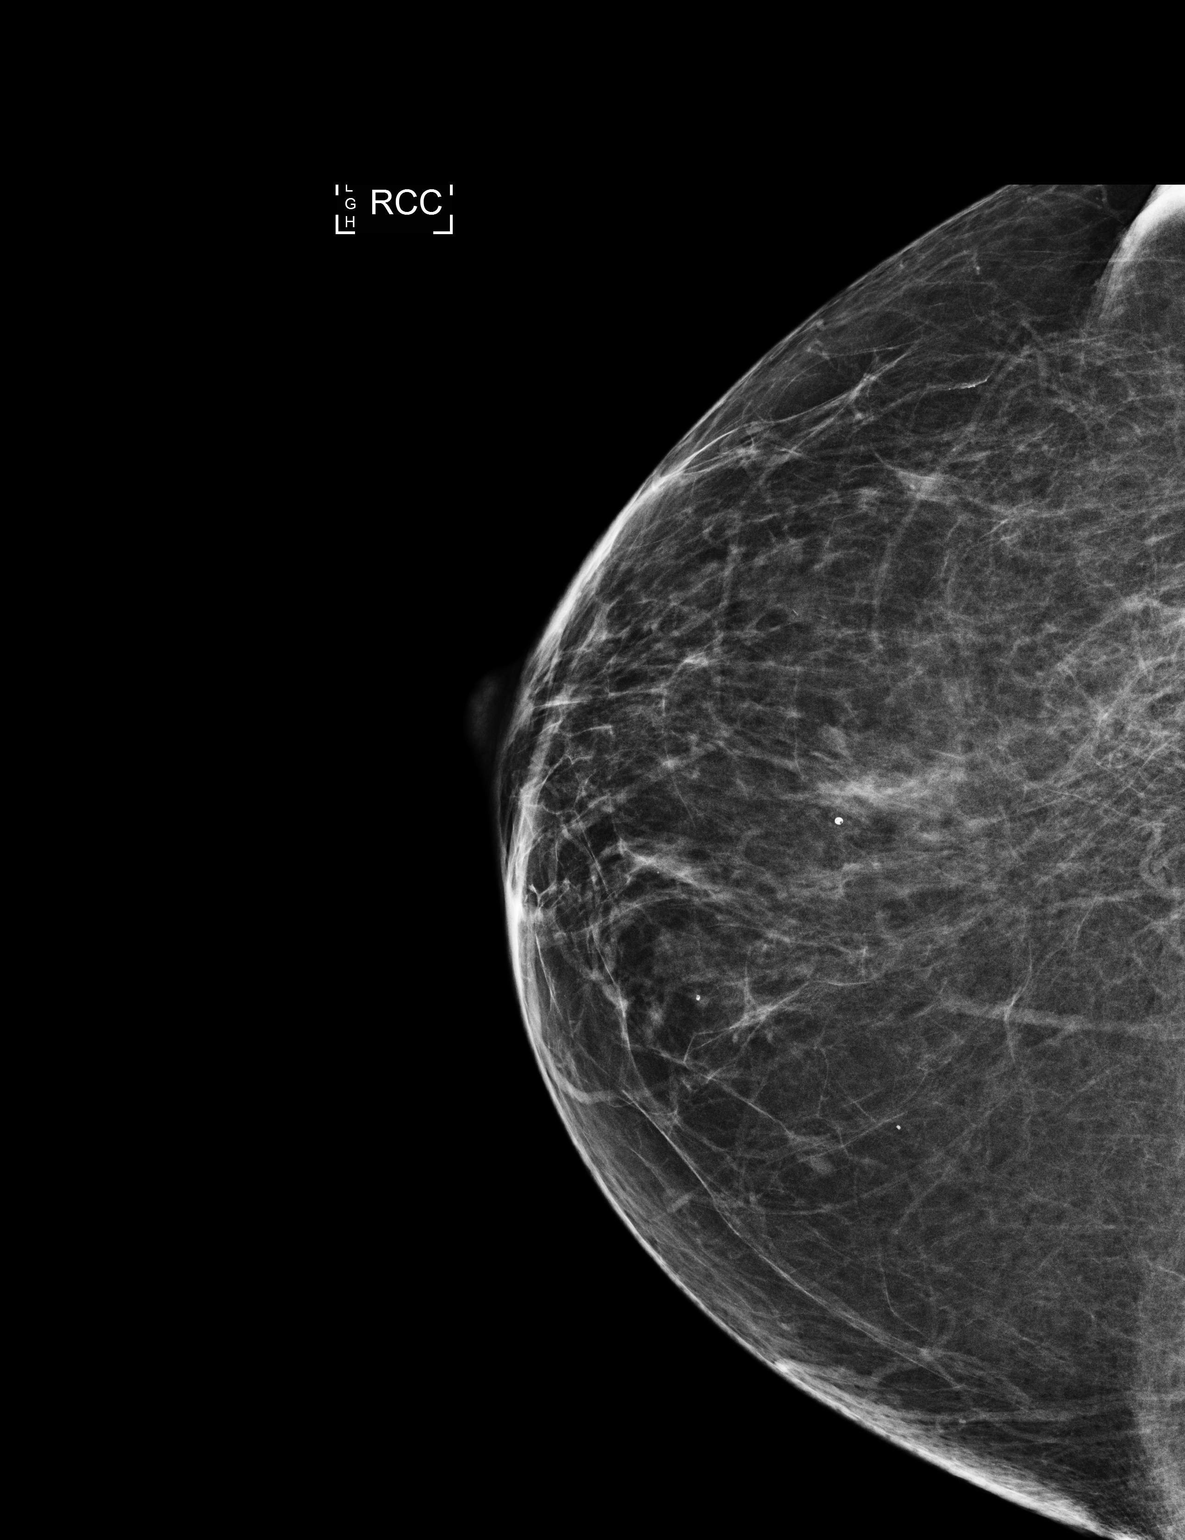

[R MLO tomo]
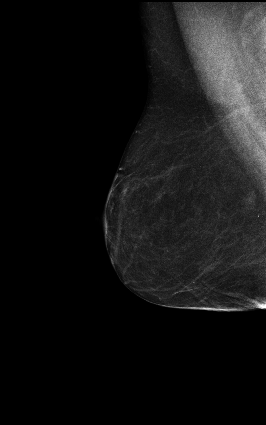

[R CC tomo]
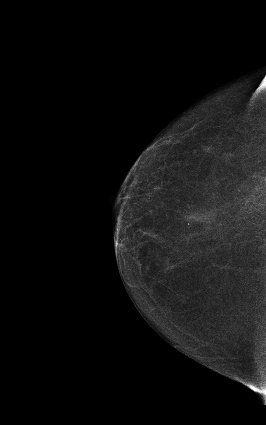

[4 of 4 positions shown; findings below may reference images not displayed]

ACR Breast Density Category b: There are scattered areas of
fibroglandular density.
FINDINGS: There are no findings suspicious for malignancy. Images were
processed with CAD.
IMPRESSION: No mammographic evidence of malignancy. A result letter of this
screening mammogram will be mailed directly to the patient.

RECOMMENDATION:
Screening mammogram in one year. (Code:TW-Z-NBD)

BI-RADS CATEGORY  1: Negative.

## 2016-10-21 DIAGNOSIS — T386X5A Adverse effect of antigonadotrophins, antiestrogens, antiandrogens, not elsewhere classified, initial encounter: Secondary | ICD-10-CM | POA: Diagnosis not present

## 2016-10-21 DIAGNOSIS — C50912 Malignant neoplasm of unspecified site of left female breast: Secondary | ICD-10-CM | POA: Diagnosis not present

## 2016-10-21 DIAGNOSIS — M810 Age-related osteoporosis without current pathological fracture: Secondary | ICD-10-CM | POA: Diagnosis not present

## 2016-10-21 DIAGNOSIS — Z9221 Personal history of antineoplastic chemotherapy: Secondary | ICD-10-CM | POA: Diagnosis not present

## 2016-10-21 DIAGNOSIS — Z79811 Long term (current) use of aromatase inhibitors: Secondary | ICD-10-CM | POA: Diagnosis not present

## 2016-10-21 DIAGNOSIS — Z9012 Acquired absence of left breast and nipple: Secondary | ICD-10-CM | POA: Diagnosis not present

## 2016-10-21 DIAGNOSIS — M818 Other osteoporosis without current pathological fracture: Secondary | ICD-10-CM | POA: Diagnosis not present

## 2016-10-21 DIAGNOSIS — Z923 Personal history of irradiation: Secondary | ICD-10-CM | POA: Diagnosis not present

## 2016-10-21 DIAGNOSIS — Z170 Estrogen receptor positive status [ER+]: Secondary | ICD-10-CM | POA: Diagnosis not present

## 2016-10-23 ENCOUNTER — Other Ambulatory Visit: Payer: Self-pay | Admitting: Internal Medicine

## 2016-10-23 DIAGNOSIS — Z1231 Encounter for screening mammogram for malignant neoplasm of breast: Secondary | ICD-10-CM

## 2016-10-23 DIAGNOSIS — Z9012 Acquired absence of left breast and nipple: Secondary | ICD-10-CM

## 2016-11-05 ENCOUNTER — Telehealth: Payer: Self-pay | Admitting: Internal Medicine

## 2016-11-05 DIAGNOSIS — G4733 Obstructive sleep apnea (adult) (pediatric): Secondary | ICD-10-CM

## 2016-11-05 NOTE — Telephone Encounter (Signed)
lmom tcb x1   Looks like an order was placed to [facility] on 09/16/16 for a new machine

## 2016-11-06 DIAGNOSIS — L2089 Other atopic dermatitis: Secondary | ICD-10-CM | POA: Diagnosis not present

## 2016-11-06 DIAGNOSIS — Z85828 Personal history of other malignant neoplasm of skin: Secondary | ICD-10-CM | POA: Diagnosis not present

## 2016-11-06 DIAGNOSIS — L820 Inflamed seborrheic keratosis: Secondary | ICD-10-CM | POA: Diagnosis not present

## 2016-11-06 NOTE — Telephone Encounter (Signed)
Chelsey [name] with [facility] states they need a new order for CPAP.  Chelsey [name] [phone] ext [phone].  RX will need CPAP settings and supplies through the template and will get this through as soon as possible.

## 2016-11-06 NOTE — Telephone Encounter (Signed)
If [facility] needs new order to replace machine-                         Order DME [facility]- please replace old CPAP machine auto 5-15, mask of chice, humidifier, supplies, AirView    Dx OSA

## 2016-11-06 NOTE — Telephone Encounter (Signed)
Order was indeed placed on 12.5.17 for new machine Called [facility] and spoke with representative Levada Dy who apologized that pt's order was not completed and stated this will be expedited.  LMOM TCB x2 to inform pt of the above.

## 2016-11-06 NOTE — Telephone Encounter (Signed)
I have spoke with Chelsey [name] with [facility], who states order that was placed on 09/16/16, will need to be in the templet. Chelsey [name] states he will send a message to RT to get a DL faxed to [place].  CY please advise on settings, as I do not see this documented. Thanks.

## 2016-11-06 NOTE — Telephone Encounter (Signed)
Placed order for new CPAP, left Corene Cornea a detailed message explaining this on his VM. I advised him to call back if he needed anything else from [place].

## 2016-11-12 DIAGNOSIS — H16223 Keratoconjunctivitis sicca, not specified as Sjogren's, bilateral: Secondary | ICD-10-CM | POA: Diagnosis not present

## 2016-11-12 DIAGNOSIS — E119 Type 2 diabetes mellitus without complications: Secondary | ICD-10-CM | POA: Diagnosis not present

## 2016-11-12 DIAGNOSIS — H02831 Dermatochalasis of right upper eyelid: Secondary | ICD-10-CM | POA: Diagnosis not present

## 2016-11-12 DIAGNOSIS — H40013 Open angle with borderline findings, low risk, bilateral: Secondary | ICD-10-CM | POA: Diagnosis not present

## 2016-11-12 DIAGNOSIS — H02834 Dermatochalasis of left upper eyelid: Secondary | ICD-10-CM | POA: Diagnosis not present

## 2016-11-21 DIAGNOSIS — Z96652 Presence of left artificial knee joint: Secondary | ICD-10-CM | POA: Diagnosis not present

## 2016-11-21 DIAGNOSIS — Z96653 Presence of artificial knee joint, bilateral: Secondary | ICD-10-CM | POA: Diagnosis not present

## 2016-11-21 DIAGNOSIS — Z471 Aftercare following joint replacement surgery: Secondary | ICD-10-CM | POA: Diagnosis not present

## 2016-11-21 DIAGNOSIS — Z96651 Presence of right artificial knee joint: Secondary | ICD-10-CM | POA: Diagnosis not present

## 2016-12-02 DIAGNOSIS — H02834 Dermatochalasis of left upper eyelid: Secondary | ICD-10-CM | POA: Diagnosis not present

## 2016-12-02 DIAGNOSIS — H02831 Dermatochalasis of right upper eyelid: Secondary | ICD-10-CM | POA: Diagnosis not present

## 2016-12-02 DIAGNOSIS — H40013 Open angle with borderline findings, low risk, bilateral: Secondary | ICD-10-CM | POA: Diagnosis not present

## 2016-12-02 DIAGNOSIS — H16223 Keratoconjunctivitis sicca, not specified as Sjogren's, bilateral: Secondary | ICD-10-CM | POA: Diagnosis not present

## 2016-12-02 DIAGNOSIS — E119 Type 2 diabetes mellitus without complications: Secondary | ICD-10-CM | POA: Diagnosis not present

## 2016-12-09 ENCOUNTER — Ambulatory Visit
Admission: RE | Admit: 2016-12-09 | Discharge: 2016-12-09 | Disposition: A | Discharge status: Home / Self Care | Payer: Medicare Other | Source: Ambulatory Visit | Attending: Internal Medicine | Admitting: Internal Medicine

## 2016-12-09 DIAGNOSIS — Z1231 Encounter for screening mammogram for malignant neoplasm of breast: Secondary | ICD-10-CM

## 2016-12-09 DIAGNOSIS — Z9012 Acquired absence of left breast and nipple: Secondary | ICD-10-CM

## 2016-12-09 DIAGNOSIS — Z780 Asymptomatic menopausal state: Secondary | ICD-10-CM | POA: Diagnosis not present

## 2016-12-09 DIAGNOSIS — E2839 Other primary ovarian failure: Secondary | ICD-10-CM

## 2016-12-09 DIAGNOSIS — M8589 Other specified disorders of bone density and structure, multiple sites: Secondary | ICD-10-CM | POA: Diagnosis not present

## 2016-12-09 DIAGNOSIS — M810 Age-related osteoporosis without current pathological fracture: Secondary | ICD-10-CM

## 2017-01-12 ENCOUNTER — Ambulatory Visit (INDEPENDENT_AMBULATORY_CARE_PROVIDER_SITE_OTHER)
Admission: RE | Admit: 2017-01-12 | Discharge: 2017-01-12 | Disposition: A | Discharge status: Home / Self Care | Payer: Medicare Other | Source: Ambulatory Visit | Attending: Internal Medicine | Admitting: Internal Medicine

## 2017-01-12 ENCOUNTER — Ambulatory Visit (INDEPENDENT_AMBULATORY_CARE_PROVIDER_SITE_OTHER): Payer: Medicare Other | Admitting: Internal Medicine

## 2017-01-12 ENCOUNTER — Encounter: Payer: Self-pay | Admitting: Internal Medicine

## 2017-01-12 ENCOUNTER — Telehealth: Payer: Self-pay | Admitting: Internal Medicine

## 2017-01-12 VITALS — BP 122/70 | HR 85 | Temp 100.8°F | Ht 64.0 in | Wt 190.4 lb

## 2017-01-12 DIAGNOSIS — J209 Acute bronchitis, unspecified: Secondary | ICD-10-CM

## 2017-01-12 DIAGNOSIS — G4733 Obstructive sleep apnea (adult) (pediatric): Secondary | ICD-10-CM | POA: Diagnosis not present

## 2017-01-12 DIAGNOSIS — R05 Cough: Secondary | ICD-10-CM | POA: Diagnosis not present

## 2017-01-12 MED ORDER — DOXYCYCLINE HYCLATE 100 MG PO TABS: 100.0000 mg | ORAL_TABLET | Freq: Two times a day (BID) | ORAL | 0 refills | Status: DC

## 2017-01-12 MED ORDER — LEVALBUTEROL HCL 0.63 MG/3ML IN NEBU
0.6300 mg | INHALATION_SOLUTION | Freq: Once | RESPIRATORY_TRACT | Status: AC
  Administered 2017-01-12: 0.63 mg via RESPIRATORY_TRACT

## 2017-01-12 MED ORDER — METHYLPREDNISOLONE ACETATE 80 MG/ML IJ SUSP
80.0000 mg | Freq: Once | INTRAMUSCULAR | Status: AC
  Administered 2017-01-12: 80 mg via INTRAMUSCULAR

## 2017-01-12 NOTE — Telephone Encounter (Signed)
Ok to work in if available

## 2017-01-12 NOTE — Telephone Encounter (Signed)
Pt has been added to today's schedule at 4:30 pm. Nothing more needed at this time.

## 2017-01-12 NOTE — Progress Notes (Signed)
HPI female never smoker here with husband. History OSA/CPAP, complicated by history breast cancer, HBP, allergic rhinitis NPSG 05/06/19 AHI 47.7/hr,   ----------------------------------------------------------------------------------------------------- 04/28/2016-71 year old female never smoker here with husband. History OSA/CPAP, complicated by history breast cancer, HBP, allergic rhinitis  cpap 11/ [facility]  FOLLOW FOR:  doing ok on CPAP.  having sinus issues, a lot of sinus drainage in the mornings.     Here with her husband confirms she is not snoring through CPAP. Download shows excellent compliance and control but indicates pressure at 14 CWP. She and we thought pressure was at 11. She has been having or trouble with pressure discomfort/headache and retro-orbital area and possibly that reflects the inadvertent increase in CPAP pressure. Bothersome morning nasal congestion nasal discharge is not purulent. Tends to get sinus infections in the winter with colds.  01/12/2017- 71 year old female never smoker followed for history OSA/CPAP, complicated by history breast cancer, HBP, allergic rhinitis, DM 2  cpap 11/ [facility]  ACUTE VISIT: ? sinus infection vs PNA; fever, chills, sweats, cough-deep in chest. Wheezing and rattles that comes and goes.  Acute illness onset 10 days ago with some muscle ache and chills, fever to 102 initially, frontal headache. Mild queasy stomach with no nausea vomiting or diarrhea. Had flu vaccine. Cough productive green and rattling. Has been using Mucinex.  ROS-see HPI     + = pos Constitutional:   No-   weight loss, night sweats, + fevers, chills, fatigue, lassitude. HEENT:   + headaches, difficulty swallowing, tooth/dental problems, sore throat,       No-  sneezing, itching, ear ache, +nasal congestion, +post nasal drip,  CV:  No-   chest pain, orthopnea, PND, swelling in lower extremities, anasarca, dizziness, palpitations Resp: No-   shortness of breath with  exertion or at rest.           + productive cough,  No non-productive cough,  No- coughing up of blood.          + change in color of mucus.  No- wheezing.   Skin: No-   rash or lesions. GI:  No-   heartburn, indigestion, abdominal pain, nausea, vomiting,  GU:  MS:  + joint pain or swelling.   Neuro-     nothing unusual Psych:  No- change in mood or affect. No depression or anxiety.  No memory loss.  OBJ- Physical Exam   + temperature here 100.8 General- Alert, Oriented, Affect-appropriate, Distress- none acute,  Skin- rash-none, lesions- none, excoriation- none Lymphadenopathy- none Head- atraumatic            Eyes- Gross vision intact, PERRLA, conjunctivae and secretions clear            Ears- Hearing, canals-normal            Nose- + prominent turbinates, Septal dev +mild, no-mucus, polyps, erosion, perforation             Throat- Mallampati III-IV, mucosa clear , drainage- none, tonsils- atrophic, own teeth Neck- flexible , trachea midline, no stridor , thyroid nl, carotid no bruit Chest - symmetrical excursion , unlabored           Heart/CV- RRR , no murmur , no gallop  , no rub, nl s1 s2                           - JVD- none , edema- none, stasis changes- none, varices- none  Lung- + bilateral rhonchi, wheeze- none, cough- none , dullness-none, rub- none           Chest wall-  Abd-  Br/ Gen/ Rectal- Not done, not indicated Extrem- cyanosis- none, clubbing, none, atrophy- none, strength- nl Neuro- grossly intact to observation

## 2017-01-12 NOTE — Assessment & Plan Note (Addendum)
Definite infection with fever. Significant rhonchi and chest with possibility this is a bronchopneumonia. Additional upper airway/sinusitis component. Plan Xopenex nebulizer treatment, Depo-Medrol, lab for  CXR, hydration

## 2017-01-12 NOTE — Assessment & Plan Note (Signed)
She has been compliant with CPAP. At next visit we will be reviewing status.

## 2017-01-12 NOTE — Telephone Encounter (Signed)
Pt c/o sinus symptoms -- fever (101.1-101.5) x 3-4 days, cough, head congestion with Trishia Cuthrell mucus, sneezing and PND. Pt states that her eyes are crusted over in the mornings. Pt notes some wheezing as well.  Pt using Mucinex Sinus Congestion for her symptoms. Pt requests appt , please advise Dr Annamaria Boots. Thanks.   Allergies as of 01/12/2017      Reactions   Ambien [zolpidem Tartrate]    Felt bad hard to function   Amoxicillin    REACTION: unspecified   Lipitor [atorvastatin Calcium]    Leg cramps   Penicillins    As child   Prednisone Itching   Latex Rash   Mupirocin Rash   Other Rash   use PAPER tape      Medication List       Accurate as of 01/12/17  9:50 AM. Always use your most recent med list.          ALPRAZolam 0.25 MG tablet Commonly known as:  XANAX Take 1 tablet (0.25 mg total) by mouth at bedtime as needed for anxiety.   anastrozole 1 MG tablet Commonly known as:  ARIMIDEX Take 1 tablet (1 mg total) by mouth daily.   denosumab 60 MG/ML Soln injection Commonly known as:  PROLIA Inject 60 mg into the skin every 6 (six) months. Administer in upper arm, thigh, or abdomen   esomeprazole 40 MG capsule Commonly known as:  NEXIUM Take 40 mg by mouth daily at 12 noon. Takes bedtime   losartan 50 MG tablet Commonly known as:  COZAAR take 1 tablet by mouth once daily   metFORMIN 500 MG 24 hr tablet Commonly known as:  GLUCOPHAGE-XR take 2 tablets by mouth twice a day with meals   rosuvastatin 10 MG tablet Commonly known as:  CRESTOR take 1 tablet by mouth once daily   sertraline 25 MG tablet Commonly known as:  ZOLOFT Take 1 tablet (25 mg total) by mouth daily.   traZODone 50 MG tablet Commonly known as:  DESYREL take 1/2 to 1 tablet by mouth at bedtime if needed for sleep

## 2017-01-12 NOTE — Patient Instructions (Addendum)
Order CXR  Dx acute bronchitis  Neb xop 0.63  Depo 42  Script sent for doxycycline

## 2017-01-13 ENCOUNTER — Telehealth: Payer: Self-pay | Admitting: Internal Medicine

## 2017-01-13 NOTE — Progress Notes (Signed)
Pt notified of results

## 2017-01-13 NOTE — Telephone Encounter (Signed)
Notes recorded by Deneise Lever, MD on 01/13/2017 at 3:39 PM EDT CXR- changes of acute bronchitis or possibly mild bronchopneumonia. Take the antibiotic as planned. Stay well hydrated.  Spoke with pt and notified of results per Dr. Annamaria Boots. Pt verbalized understanding and denied any questions.

## 2017-01-22 ENCOUNTER — Other Ambulatory Visit: Payer: Self-pay | Admitting: Internal Medicine

## 2017-01-22 ENCOUNTER — Encounter: Payer: Self-pay | Admitting: Internal Medicine

## 2017-01-22 ENCOUNTER — Ambulatory Visit (INDEPENDENT_AMBULATORY_CARE_PROVIDER_SITE_OTHER): Payer: Medicare Other | Admitting: Internal Medicine

## 2017-01-22 VITALS — BP 128/68 | HR 55 | Temp 98.7°F | Ht 64.0 in | Wt 191.0 lb

## 2017-01-22 DIAGNOSIS — I10 Essential (primary) hypertension: Secondary | ICD-10-CM | POA: Diagnosis not present

## 2017-01-22 DIAGNOSIS — C50412 Malignant neoplasm of upper-outer quadrant of left female breast: Secondary | ICD-10-CM | POA: Diagnosis not present

## 2017-01-22 DIAGNOSIS — E785 Hyperlipidemia, unspecified: Secondary | ICD-10-CM | POA: Diagnosis not present

## 2017-01-22 DIAGNOSIS — Z79899 Other long term (current) drug therapy: Secondary | ICD-10-CM | POA: Diagnosis not present

## 2017-01-22 DIAGNOSIS — M810 Age-related osteoporosis without current pathological fracture: Secondary | ICD-10-CM

## 2017-01-22 DIAGNOSIS — E1100 Type 2 diabetes mellitus with hyperosmolarity without nonketotic hyperglycemic-hyperosmolar coma (NKHHC): Secondary | ICD-10-CM | POA: Diagnosis not present

## 2017-01-22 LAB — POCT GLYCOSYLATED HEMOGLOBIN (HGB A1C): Hemoglobin A1C: 6.8

## 2017-01-22 MED ORDER — METFORMIN HCL ER 500 MG PO TB24: ORAL_TABLET | ORAL | 3 refills | Status: DC

## 2017-01-22 MED ORDER — LOSARTAN POTASSIUM 50 MG PO TABS: 50.0000 mg | ORAL_TABLET | Freq: Every day | ORAL | 3 refills | Status: DC

## 2017-01-22 MED ORDER — ROSUVASTATIN CALCIUM 10 MG PO TABS: 10.0000 mg | ORAL_TABLET | Freq: Every day | ORAL | 3 refills | Status: DC

## 2017-01-22 NOTE — Patient Instructions (Addendum)
  a1c is acceptable control  For the DM.  Your last calcium was normal in January . ROV in 6 months   Labs at that visit   Yearly visit or as needed . Glad you are getting better .   Lab Results  Component Value Date   WBC 7.4 07/24/2016   HGB 13.7 07/24/2016   HCT 40.3 07/24/2016   PLT 203.0 07/24/2016   GLUCOSE 144 (H) 07/24/2016   CHOL 178 07/24/2016   TRIG 95.0 07/24/2016   HDL 67.30 07/24/2016   LDLDIRECT 173.9 11/09/2013   LDLCALC 91 07/24/2016   ALT 24 07/24/2016   AST 22 07/24/2016   NA 140 07/24/2016   K 4.9 07/24/2016   CL 102 07/24/2016   CREATININE 0.76 07/24/2016   BUN 12 07/24/2016   CO2 30 07/24/2016   TSH 1.97 07/24/2016   INR 0.93 08/31/2013   HGBA1C 6.9 (H) 07/24/2016   MICROALBUR <0.7 07/24/2016   .lSite Region Measured Date Measured Age YA BMD Significant CHANGE T-score DualFemur Neck Left 12/09/2016    69.6         -1.6    0.812 g/cm2 *  AP Spine  L3-L4     12/09/2016    69.6         -1.2    1.072 g/cm2  World Health Organization [facility]) criteria for post-menopausal, Caucasian Women: Normal       T-score at or above -1 SD Osteopenia   T-score between -1 and -2.5 SD Osteoporosis T-score at or below -2.5 SD  RECOMMENDATION: [place] recommends that FDA-approved medical therapies be considered in postmenopausal women and men age 44 or older with a:  1. Hip or vertebral (clinical or morphometric) fracture. 2. T-score of <-2.5 at the spine or hip. 3. Ten-year fracture probability by FRAX of 3% or greater for hip fracture or 20% or greater for major osteoporotic fracture.  All treatment decisions require clinical judgment and consideration of individual patient factors, including patient preferences, co-morbidities, previous drug use, risk factors not captured in the FRAX model (e.g. falls, vitamin D deficiency, increased bone turnover, interval significant decline in bone density) and possible under - or  over-estimation of fracture risk by FRAX.  All patients should ensure an adequate intake of dietary calcium (1200 mg/d) and vitamin D (800 IU daily) unless contraindicated.  FOLLOW-UP: People with diagnosed cases of osteoporosis or at high risk for fracture should have regular bone mineral density tests. For patients eligible for Medicare, routine testing is allowed once every 2 years. The testing frequency can be increased to one year for patients who have rapidly progressing disease, those who are receiving or discontinuing medical therapy to restore bone mass, or have additional risk factors.  I have reviewed this report, and agree with the above findings. [facility]   Electronically Signed   By: Rolm Baptise M.D.   On: 12/09/2016 10:46

## 2017-01-22 NOTE — Progress Notes (Signed)
Chief Complaint  Patient presents with  . Follow-up  . Diabetes    HPI: Chelsey [name] 71 y.o. come in for Chronic disease management  Has hx of dm controlled  bfreast cancer  osa  Ht HLD osteoporosis  She recently had fever and cough pneumonia and bronchitis treated with steroids and antibiotics and is a lot better see previous notes Dr. Annamaria Boots. Isn't really checking her blood sugar feels okay. Is on presently had a recent bone scan on into cancer suppression. Last scan apparently for cancer evaluation was normal. No neuropathy changes. Needs refills of losartan metformin and Crestor. Is on vitamin D thousand units and calcium 100 mg.  ROS: See pertinent positives and negatives per HPI.  Past Medical History:  Diagnosis Date  . Allergy   . Aphthous ulcer of mouth 2010   related to chemotherapy   . Breast cancer ([place])   . Carpal tunnel syndrome of right wrist   . Colitis   . Colon polyp   . Diabetes mellitus   . Diverticulosis   . GERD (gastroesophageal reflux disease)   . Hyperlipidemia   . Hypertension   . Lymphedema 08-31-13   left arm  . Osteoarthritis   . Pneumonia 07/28/2011  . Sinusitis   . Sleep apnea    setting of 10  . Squamous cell carcinoma    left leg    Family History  Problem Relation Age of Onset  . Alzheimer's disease Mother   . Sleep apnea Mother   . Diabetes Mother   . Hypertension Mother   . Hyperlipidemia Mother   . Breast cancer Mother 31    triple negative  . Anxiety disorder Daughter   . Hypertension Son     pulmonary  . Congenital heart disease Son 0    TGA  died age 52     Social History   Social History  . Marital status: Married    Spouse name: N/A  . Number of children: 3  . Years of education: N/A   Occupational History  . 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 Smoking status: Never Smoker  . Smokeless tobacco: Never Used  . Alcohol use 3.6 oz/week    6 Glasses of wine per  week     Comment: 1-2 glasses every other night  . Drug use: No  . Sexual activity: Yes    Birth control/ protection: Post-menopausal, Surgical   Other Topics Concern  . 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   Passed away sept 22 15  Age 28    Outpatient Medications Prior to Visit  Medication Sig Dispense Refill  . ALPRAZolam (XANAX) 0.25 MG tablet Take 1 tablet (0.25 mg total) by mouth at bedtime as needed for anxiety. 30 tablet 0  . anastrozole (ARIMIDEX) 1 MG tablet Take 1 tablet (1 mg total) by mouth daily. 90 tablet 5  . denosumab (PROLIA) 60 MG/ML SOLN injection Inject 60 mg into the skin every 6 (six) months. Administer in upper arm, thigh, or abdomen    . esomeprazole (NEXIUM) 40 MG capsule Take 40 mg by mouth daily at 12 noon. Takes bedtime    . sertraline (ZOLOFT) 25 MG tablet Take 1 tablet (25 mg total) by mouth daily.  90 tablet 3  . traZODone (DESYREL) 50 MG tablet take 1/2 to 1 tablet by mouth at bedtime if needed for sleep 30 tablet 2  . doxycycline (VIBRA-TABS) 100 MG tablet Take 1 tablet (100 mg total) by mouth 2 (two) times daily. 14 tablet 0  . doxycycline (VIBRA-TABS) 100 MG tablet Take 1 tablet (100 mg total) by mouth 2 (two) times daily. 14 tablet 0  . losartan (COZAAR) 50 MG tablet take 1 tablet by mouth once daily 90 tablet 3  . metFORMIN (GLUCOPHAGE-XR) 500 MG 24 hr tablet take 2 tablets by mouth twice a day with meals 120 tablet 5  . rosuvastatin (CRESTOR) 10 MG tablet take 1 tablet by mouth once daily 30 tablet 4   No facility-administered medications prior to visit.      EXAM:  BP 128/68 (BP Location: Right Arm, Patient Position: Sitting, Cuff Size: Normal)   Pulse (!) 55   Temp 98.7 F (37.1 C) (Oral)   Ht 5\' 4"  (1.626 m)   Wt 191 lb (86.6 kg)   SpO2 98%   BMI 32.79 kg/m   Body mass index is 32.79 kg/m.  GENERAL:  vitals reviewed and listed above, alert, oriented, appears well hydrated and in no acute distress HEENT: atraumatic, conjunctiva  clear, no obvious abnormalities on inspection of external nose and ears NECK: no obvious masses on inspection palpation  LUNGS: clear to auscultation bilaterally, no wheezes, rales or rhonchi, good air movement CV: HRRR, no clubbing cyanosis or  peripheral edema nl cap refill  MS: moves all extremities without noticeable focal  abnormality PSYCH: pleasant and cooperative, no obvious depression or anxiety Lab Results  Component Value Date   WBC 7.4 07/24/2016   HGB 13.7 07/24/2016   HCT 40.3 07/24/2016   PLT 203.0 07/24/2016   GLUCOSE 144 (H) 07/24/2016   CHOL 178 07/24/2016   TRIG 95.0 07/24/2016   HDL 67.30 07/24/2016   LDLDIRECT 173.9 11/09/2013   LDLCALC 91 07/24/2016   ALT 24 07/24/2016   AST 22 07/24/2016   NA 140 07/24/2016   K 4.9 07/24/2016   CL 102 07/24/2016   CREATININE 0.76 07/24/2016   BUN 12 07/24/2016   CO2 30 07/24/2016   TSH 1.97 07/24/2016   INR 0.93 08/31/2013   HGBA1C 6.8 01/22/2017   MICROALBUR <0.7 07/24/2016   BP Readings from Last 3 Encounters:  01/22/17 128/68  01/12/17 122/70  09/16/16 122/76   Wt Readings from Last 3 Encounters:  01/22/17 191 lb (86.6 kg)  01/12/17 190 lb 6.4 oz (86.4 kg)  09/16/16 194 lb 9.6 oz (88.3 kg)    ASSESSMENT AND PLAN:  Discussed the following assessment and plan:  Type 2 diabetes mellitus with hyperosmolarity without coma, unspecified long term insulin use status (HCC) - controlled at thsi time - Plan: POCT glycosylated hemoglobin (Hb A1C)  Medication management  Essential hypertension - controlled refill med  Hyperlipidemia, unspecified hyperlipidemia type - refill med  no se noted  Malignant neoplasm of upper-outer quadrant of left female breast, unspecified estrogen receptor status ([place])  Osteoporosis without current pathological fracture, unspecified osteoporosis type - dexa  in osteopenic range  Hg a1c acceptable. Convalescent    clincal pna    Improved on  rx  Per dr Annamaria Boots .  reveiwed dexa, labs from care every where   Can do vit d with next labs ini fneeded.  Plan yearly  In 6 months or as needed  -Patient advised to return or notify health care team  if  new concerns arise.  Patient Instructions    a1c is acceptable control  For the DM.  Your last calcium was normal in January . ROV in 6 months   Labs at that visit   Yearly visit or as needed . Glad you are getting better .   Lab Results  Component Value Date   WBC 7.4 07/24/2016   HGB 13.7 07/24/2016   HCT 40.3 07/24/2016   PLT 203.0 07/24/2016   GLUCOSE 144 (H) 07/24/2016   CHOL 178 07/24/2016   TRIG 95.0 07/24/2016   HDL 67.30 07/24/2016   LDLDIRECT 173.9 11/09/2013   LDLCALC 91 07/24/2016   ALT 24 07/24/2016   AST 22 07/24/2016   NA 140 07/24/2016   K 4.9 07/24/2016   CL 102 07/24/2016   CREATININE 0.76 07/24/2016   BUN 12 07/24/2016   CO2 30 07/24/2016   TSH 1.97 07/24/2016   INR 0.93 08/31/2013   HGBA1C 6.9 (H) 07/24/2016   MICROALBUR <0.7 07/24/2016   .lSite Region Measured Date Measured Age YA BMD Significant CHANGE T-score DualFemur Neck Left 12/09/2016    69.6         -1.6    0.812 g/cm2 *  AP Spine  L3-L4     12/09/2016    69.6         -1.2    1.072 g/cm2  World Health Organization [facility]) criteria for post-menopausal, Caucasian Women: Normal       T-score at or above -1 SD Osteopenia   T-score between -1 and -2.5 SD Osteoporosis T-score at or below -2.5 SD  RECOMMENDATION: [place] recommends that FDA-approved medical therapies be considered in postmenopausal women and men age 7 or older with a:  1. Hip or vertebral (clinical or morphometric) fracture. 2. T-score of <-2.5 at the spine or hip. 3. Ten-year fracture probability by FRAX of 3% or greater for hip fracture or 20% or greater for major osteoporotic fracture.  All treatment  decisions require clinical judgment and consideration of individual patient factors, including patient preferences, co-morbidities, previous drug use, risk factors not captured in the FRAX model (e.g. falls, vitamin D deficiency, increased bone turnover, interval significant decline in bone density) and possible under - or over-estimation of fracture risk by FRAX.  All patients should ensure an adequate intake of dietary calcium (1200 mg/d) and vitamin D (800 IU daily) unless contraindicated.  FOLLOW-UP: People with diagnosed cases of osteoporosis or at high risk for fracture should have regular bone mineral density tests. For patients eligible for Medicare, routine testing is allowed once every 2 years. The testing frequency can be increased to one year for patients who have rapidly progressing disease, those who are receiving or discontinuing medical therapy to restore bone mass, or have additional risk factors.  I have reviewed this report, and agree with the above findings. [facility]   Electronically Signed   By: Rolm Baptise M.D.   On: 12/09/2016 10:46     Standley Brooking. Panosh M.D.

## 2017-02-18 DIAGNOSIS — Z419 Encounter for procedure for purposes other than remedying health state, unspecified: Secondary | ICD-10-CM | POA: Diagnosis not present

## 2017-02-18 DIAGNOSIS — L814 Other melanin hyperpigmentation: Secondary | ICD-10-CM | POA: Diagnosis not present

## 2017-02-18 DIAGNOSIS — Z85828 Personal history of other malignant neoplasm of skin: Secondary | ICD-10-CM | POA: Diagnosis not present

## 2017-02-18 DIAGNOSIS — D1801 Hemangioma of skin and subcutaneous tissue: Secondary | ICD-10-CM | POA: Diagnosis not present

## 2017-02-18 DIAGNOSIS — L570 Actinic keratosis: Secondary | ICD-10-CM | POA: Diagnosis not present

## 2017-02-18 DIAGNOSIS — L918 Other hypertrophic disorders of the skin: Secondary | ICD-10-CM | POA: Diagnosis not present

## 2017-02-18 DIAGNOSIS — L821 Other seborrheic keratosis: Secondary | ICD-10-CM | POA: Diagnosis not present

## 2017-03-17 ENCOUNTER — Ambulatory Visit: Payer: Medicare Other | Admitting: Internal Medicine

## 2017-04-07 DIAGNOSIS — Z9012 Acquired absence of left breast and nipple: Secondary | ICD-10-CM | POA: Diagnosis not present

## 2017-04-07 DIAGNOSIS — Z888 Allergy status to other drugs, medicaments and biological substances status: Secondary | ICD-10-CM | POA: Diagnosis not present

## 2017-04-07 DIAGNOSIS — E785 Hyperlipidemia, unspecified: Secondary | ICD-10-CM | POA: Diagnosis not present

## 2017-04-07 DIAGNOSIS — Z170 Estrogen receptor positive status [ER+]: Secondary | ICD-10-CM | POA: Diagnosis not present

## 2017-04-07 DIAGNOSIS — Z923 Personal history of irradiation: Secondary | ICD-10-CM | POA: Diagnosis not present

## 2017-04-07 DIAGNOSIS — Z9889 Other specified postprocedural states: Secondary | ICD-10-CM | POA: Diagnosis not present

## 2017-04-07 DIAGNOSIS — M818 Other osteoporosis without current pathological fracture: Secondary | ICD-10-CM | POA: Diagnosis not present

## 2017-04-07 DIAGNOSIS — Z886 Allergy status to analgesic agent status: Secondary | ICD-10-CM | POA: Diagnosis not present

## 2017-04-07 DIAGNOSIS — C50912 Malignant neoplasm of unspecified site of left female breast: Secondary | ICD-10-CM | POA: Diagnosis not present

## 2017-04-07 DIAGNOSIS — Z79899 Other long term (current) drug therapy: Secondary | ICD-10-CM | POA: Diagnosis not present

## 2017-04-07 DIAGNOSIS — Z880 Allergy status to penicillin: Secondary | ICD-10-CM | POA: Diagnosis not present

## 2017-04-07 DIAGNOSIS — Z79811 Long term (current) use of aromatase inhibitors: Secondary | ICD-10-CM | POA: Diagnosis not present

## 2017-04-07 DIAGNOSIS — Z91040 Latex allergy status: Secondary | ICD-10-CM | POA: Diagnosis not present

## 2017-04-07 DIAGNOSIS — T386X5A Adverse effect of antigonadotrophins, antiestrogens, antiandrogens, not elsewhere classified, initial encounter: Secondary | ICD-10-CM | POA: Diagnosis not present

## 2017-04-07 DIAGNOSIS — N898 Other specified noninflammatory disorders of vagina: Secondary | ICD-10-CM | POA: Diagnosis not present

## 2017-04-07 DIAGNOSIS — Z5112 Encounter for antineoplastic immunotherapy: Secondary | ICD-10-CM | POA: Diagnosis not present

## 2017-04-07 DIAGNOSIS — Z9221 Personal history of antineoplastic chemotherapy: Secondary | ICD-10-CM | POA: Diagnosis not present

## 2017-04-07 DIAGNOSIS — Z885 Allergy status to narcotic agent status: Secondary | ICD-10-CM | POA: Diagnosis not present

## 2017-04-07 DIAGNOSIS — I10 Essential (primary) hypertension: Secondary | ICD-10-CM | POA: Diagnosis not present

## 2017-04-07 DIAGNOSIS — Z96653 Presence of artificial knee joint, bilateral: Secondary | ICD-10-CM | POA: Diagnosis not present

## 2017-04-07 DIAGNOSIS — Z881 Allergy status to other antibiotic agents status: Secondary | ICD-10-CM | POA: Diagnosis not present

## 2017-05-21 ENCOUNTER — Other Ambulatory Visit: Payer: Self-pay | Admitting: Internal Medicine

## 2017-06-02 DIAGNOSIS — E119 Type 2 diabetes mellitus without complications: Secondary | ICD-10-CM | POA: Diagnosis not present

## 2017-06-02 DIAGNOSIS — Z961 Presence of intraocular lens: Secondary | ICD-10-CM | POA: Diagnosis not present

## 2017-06-02 DIAGNOSIS — H40013 Open angle with borderline findings, low risk, bilateral: Secondary | ICD-10-CM | POA: Diagnosis not present

## 2017-06-02 DIAGNOSIS — H02834 Dermatochalasis of left upper eyelid: Secondary | ICD-10-CM | POA: Diagnosis not present

## 2017-06-02 DIAGNOSIS — H2512 Age-related nuclear cataract, left eye: Secondary | ICD-10-CM | POA: Diagnosis not present

## 2017-06-02 DIAGNOSIS — H2511 Age-related nuclear cataract, right eye: Secondary | ICD-10-CM | POA: Diagnosis not present

## 2017-06-02 DIAGNOSIS — H02831 Dermatochalasis of right upper eyelid: Secondary | ICD-10-CM | POA: Diagnosis not present

## 2017-06-09 DIAGNOSIS — H53483 Generalized contraction of visual field, bilateral: Secondary | ICD-10-CM | POA: Diagnosis not present

## 2017-06-09 DIAGNOSIS — R234 Changes in skin texture: Secondary | ICD-10-CM | POA: Diagnosis not present

## 2017-06-09 DIAGNOSIS — H02831 Dermatochalasis of right upper eyelid: Secondary | ICD-10-CM | POA: Diagnosis not present

## 2017-06-09 DIAGNOSIS — H02413 Mechanical ptosis of bilateral eyelids: Secondary | ICD-10-CM | POA: Diagnosis not present

## 2017-06-09 DIAGNOSIS — H02834 Dermatochalasis of left upper eyelid: Secondary | ICD-10-CM | POA: Diagnosis not present

## 2017-06-17 ENCOUNTER — Ambulatory Visit (INDEPENDENT_AMBULATORY_CARE_PROVIDER_SITE_OTHER): Payer: Medicare Other | Admitting: Internal Medicine

## 2017-06-17 ENCOUNTER — Encounter: Payer: Self-pay | Admitting: Internal Medicine

## 2017-06-17 VITALS — BP 110/64 | HR 58 | Ht 64.0 in | Wt 193.4 lb

## 2017-06-17 DIAGNOSIS — G4733 Obstructive sleep apnea (adult) (pediatric): Secondary | ICD-10-CM

## 2017-06-17 DIAGNOSIS — J310 Chronic rhinitis: Secondary | ICD-10-CM | POA: Diagnosis not present

## 2017-06-17 DIAGNOSIS — H1045 Other chronic allergic conjunctivitis: Secondary | ICD-10-CM

## 2017-06-17 DIAGNOSIS — H1010 Acute atopic conjunctivitis, unspecified eye: Secondary | ICD-10-CM | POA: Insufficient documentation

## 2017-06-17 MED ORDER — AZELASTINE HCL 0.1 % NA SOLN: NASAL | 12 refills | Status: DC

## 2017-06-17 NOTE — Assessment & Plan Note (Signed)
Dymista nasal spray seemed to help. She just used it intermittently, suggesting the antihistamine component was but she liked. Plan-prescription for Astelin nasal spray with discussion

## 2017-06-17 NOTE — Assessment & Plan Note (Signed)
Identified by her eye doctor. Plan-okay to use Claritin. Okay to try OTC allergy eyedrops as discussed.

## 2017-06-17 NOTE — Patient Instructions (Signed)
We can continue CPAP auto 5-15, mask of choice, humidifier, supplies, AirView  Order- referral to [facility] tech for CPAP mask fitting/ desensitization     Dx OSA  Suggest otc allergy eye drops like AlleRx, Naphcon, etc for trial  Script printed for Astelin nasal spray     1-2 puffs each nostril once or twice daily as needed

## 2017-06-17 NOTE — Progress Notes (Signed)
HPI female never smoker here with husband. History OSA/CPAP, complicated by history breast cancer, HBP, allergic rhinitis NPSG 05/06/19 AHI 47.7/hr,   ----------------------------------------------------------------------------------------------------- 01/12/2017- 71 year old female never smoker followed for history OSA/CPAP, complicated by history breast cancer, HBP, allergic rhinitis, DM 2  cpap 11/ [facility]  ACUTE VISIT: ? sinus infection vs PNA; fever, chills, sweats, cough-deep in chest. Wheezing and rattles that comes and goes.  Acute illness onset 10 days ago with some muscle ache and chills, fever to 102 initially, frontal headache. Mild queasy stomach with no nausea vomiting or diarrhea. Had flu vaccine. Cough productive green and rattling. Has been using Mucinex.  06/17/17- 71 year old female never smoker followed for history OSA/CPAP, complicated by history breast cancer, HBP, allergic rhinitis, DM 2  cpap auto 5-15/ [facility]  FOLLOWS FOR:  pt is wanting to change mask with [facility].  wearing her cpap about 7-8 hours per night.  no complaints She continues to benefit from CPAP and is very compliant but finds mask bothersome-pulls and blows. Says she has not been able to get an alternative through [facility] that suited her. Download 100% averaging almost 9 hours per night, AHI 1.6/hour. Perennial nasal stuffiness. Sample Dymista nasal spray had helped. Also troubled by itching and drainage from eyes identified by her eye doctor as "allergy".  ROS-see HPI     + = pos Constitutional:   No-   weight loss, night sweats, + fevers, chills, fatigue, lassitude. HEENT:   + headaches, difficulty swallowing, tooth/dental problems, sore throat,       No-  sneezing, +itching, ear ache, +nasal congestion, +post nasal drip,  CV:  No-   chest pain, orthopnea, PND, swelling in lower extremities, anasarca, dizziness, palpitations Resp: No-   shortness of breath with exertion or at rest.            productive  cough,  No non-productive cough,  No- coughing up of blood.           change in color of mucus.  No- wheezing.   Skin: No-   rash or lesions. GI:  No-   heartburn, indigestion, abdominal pain, nausea, vomiting,  GU:  MS:  + joint pain or swelling.   Neuro-     nothing unusual Psych:  No- change in mood or affect. No depression or anxiety.  No memory loss.  OBJ- Physical Exam    General- Alert, Oriented, Affect-appropriate, Distress- none acute, + overweight Skin- rash-none, lesions- none, excoriation- none Lymphadenopathy- none Head- atraumatic            Eyes- Gross vision intact, PERRLA, conjunctivae and secretions clear            Ears- Hearing, canals-normal            Nose- + prominent turbinates, Septal dev +mild, no-mucus, polyps, erosion, perforation             Throat- Mallampati III-IV, mucosa clear , drainage- none, tonsils- atrophic, own teeth Neck- flexible , trachea midline, no stridor , thyroid nl, carotid no bruit Chest - symmetrical excursion , unlabored           Heart/CV- RRR , no murmur , no gallop  , no rub, nl s1 s2                           - JVD- none , edema- none, stasis changes- none, varices- none           Lung- clear,  wheeze- none, cough- none , dullness-none, rub- none           Chest wall-  Abd-  Br/ Gen/ Rectal- Not done, not indicated Extrem- cyanosis- none, clubbing, none, atrophy- none, strength- nl Neuro- grossly intact to observation

## 2017-06-17 NOTE — Assessment & Plan Note (Signed)
Download confirms excellent compliance and control. Pressure settings are appropriate. She does want to look at alternative masks. Plan-referred to daytime sleep center for mask fitting/desensitization.

## 2017-07-21 ENCOUNTER — Ambulatory Visit (HOSPITAL_BASED_OUTPATIENT_CLINIC_OR_DEPARTMENT_OTHER): Payer: Medicare Other | Attending: Pulmonary Disease | Admitting: Radiology

## 2017-07-21 DIAGNOSIS — G4733 Obstructive sleep apnea (adult) (pediatric): Secondary | ICD-10-CM

## 2017-07-26 ENCOUNTER — Other Ambulatory Visit: Payer: Self-pay | Admitting: Internal Medicine

## 2017-07-27 ENCOUNTER — Ambulatory Visit: Payer: Medicare Other | Admitting: Internal Medicine

## 2017-07-29 NOTE — Telephone Encounter (Signed)
Patient would like a refill for medication. Last refill was 05/22/2017 last OV 01/22/2017. Please advise.Thank you

## 2017-07-30 ENCOUNTER — Other Ambulatory Visit: Payer: Self-pay | Admitting: Internal Medicine

## 2017-07-30 NOTE — Telephone Encounter (Signed)
Refilled x 6 refills.  Pt aware medication refilled via voicemail.  Nothing further needed.

## 2017-07-30 NOTE — Telephone Encounter (Signed)
Ok to refill x 6  

## 2017-07-30 NOTE — Telephone Encounter (Signed)
  Refill request for  Medication: Trazodone 50mg  - Sig: 1/2 - 1 tablet by mouth at bedtime if needed for sleep. #30 Last Filled: 05/22/17 Previous / Upcoming Appt: 01/22/17  Please advise Dr Regis Bill, thanks.

## 2017-07-30 NOTE — Telephone Encounter (Signed)
Pharmacy called for pt to get an answer on this refill for  traZODone (DESYREL) 50 MG tablet  Another refill encounter was sent 10/14 for this same med, but this was at a different [facility].  Pt is very anxious to get an answer today.  [facility] [address], [address] [facility] [address]

## 2017-07-30 NOTE — Telephone Encounter (Signed)
New message started 07/30/17 for different pharmacy.  Will close this encounter.

## 2017-08-03 ENCOUNTER — Encounter: Payer: Self-pay | Admitting: Internal Medicine

## 2017-08-03 NOTE — Progress Notes (Signed)
Chief Complaint  Patient presents with  . Follow-up    A1c check, not fasting.     HPI: Chelsey [name] 71 y.o. come in for Chronic disease management  Yearly check  DM not checking due for  a1c  No se of metformin  LIPID taking med qod  To avoid SE  HT  ok Had molar crack and dentist    Oral surgery pulled tooth and   Sinus.  And no power at [place] .     rx with antibiotic  On clindamycine 150 qid.     Now chest    Sx .   Thinks extraction caused hole in sinus and not sure what to do next  No fever Has fu Monday .  oS  Breast cancer : on  prevention and has fu  With  Specialists soon.  No falling numbness  neru sx .  Asks for refill  Alprazolam.  for the year uses as needed  Only take nexium as needed  osa dr Annamaria Boots ROS: See pertinent positives and negatives per HPI.  Past Medical History:  Diagnosis Date  . Allergy   . Aphthous ulcer of mouth 2010   related to chemotherapy   . Breast cancer ([place])   . Carpal tunnel syndrome of right wrist   . Colitis   . Colon polyp   . Diabetes mellitus   . Diverticulosis   . GERD (gastroesophageal reflux disease)   . Hyperlipidemia   . Hypertension   . Lymphedema 08-31-13   left arm  . Osteoarthritis   . Pneumonia 07/28/2011  . Sinusitis   . Sleep apnea    setting of 10  . Squamous cell carcinoma    left leg    Family History  Problem Relation Age of Onset  . Alzheimer's disease Mother   . Sleep apnea Mother   . Diabetes Mother   . Hypertension Mother   . Hyperlipidemia Mother   . Breast cancer Mother 76       triple negative  . Anxiety disorder Daughter   . Hypertension Son        pulmonary  . Congenital heart disease Son 0       TGA  died age 60     Social History   Social History  . Marital status: Married    Spouse name: N/A  . Number of children: 3  . Years of education: N/A   Occupational History  . 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 Smoking status: Never  Smoker  . Smokeless tobacco: Never Used  . Alcohol use 3.6 oz/week    6 Glasses of wine per week     Comment: 1-2 glasses every other night  . Drug use: No  . Sexual activity: Yes    Birth control/ protection: Post-menopausal, Surgical   Other Topics Concern  . None   Social History Narrative   cb x 3   Realtor married    Bereaved  Parent   2 adult children   Mother with dementia and breast cancer   Passed away sept 57 15  Age 38    Outpatient Medications Prior to Visit  Medication Sig Dispense Refill  . anastrozole (ARIMIDEX) 1 MG tablet Take 1 tablet (1 mg total) by mouth daily. 90 tablet 5  . azelastine (ASTELIN) 0.1 % nasal spray 1-2 puffs each nostril twice daily as needed 30 mL 12  . denosumab (  PROLIA) 60 MG/ML SOLN injection Inject 60 mg into the skin every 6 (six) months. Administer in upper arm, thigh, or abdomen    . esomeprazole (NEXIUM) 40 MG capsule Take 40 mg by mouth as needed. Takes bedtime    . losartan (COZAAR) 50 MG tablet Take 1 tablet (50 mg total) by mouth daily. 90 tablet 3  . metFORMIN (GLUCOPHAGE-XR) 500 MG 24 hr tablet take 2 tablets by mouth twice a day with meals 360 tablet 3  . rosuvastatin (CRESTOR) 10 MG tablet Take 1 tablet (10 mg total) by mouth daily. 90 tablet 3  . sertraline (ZOLOFT) 25 MG tablet take 1 tablet by mouth once daily 90 tablet 3  . traZODone (DESYREL) 50 MG tablet TAKE 1/2 TO 1 TABLET BY MOUTH AT BEDTIME IF NEEDED FOR SLEEP 30 tablet 6  . ALPRAZolam (XANAX) 0.25 MG tablet Take 1 tablet (0.25 mg total) by mouth at bedtime as needed for anxiety. 30 tablet 0   No facility-administered medications prior to visit.      EXAM:  BP 120/72 (BP Location: Right Arm, Patient Position: Sitting, Cuff Size: Normal)   Pulse (!) 103   Temp 98.6 F (37 C) (Oral)   Wt 191 lb 12.8 oz (87 kg)   BMI 32.92 kg/m   Body mass index is 32.92 kg/m. Physical Exam: Vital signs reviewed YTK:ZSWF is a well-developed well-nourished alert cooperative   female who appears her stated age in no acute distress. Congested   No toxic cough ocass nl color HEENT: normocephalic atraumatic , Eyes: PERRL EOM's full, conjunctiva clear, Nares: paten,t no deformity discharge 2+ congested face mild  tenderness., Ears: no deformity EAC's clear TMs with normal landmarks. Mouth: clear OP, no lesions, edema.  Moist mucous membranes.NECK: supple without masses, thyromegaly or bruits. CHEST/PULM:  Clear to auscultation and percussion breath sounds equal no wheeze , rales or rhonchi.  CV: PMI is nondisplaced, S1 S2 no gallops, murmurs, rubs. Peripheral pulses are full without delay.No JVD .  ABDOMEN: Bowel sounds normal nontender  No guard or rebound, no hepato splenomegal no CVA tenderness.  . Extremtities:  No clubbing cyanosis or edema, no acute joint swelling or redness no focal atrophy NEURO:  Oriented x3, cranial nerves 3-12 appear to be intact, no obvious focal weakness,gait within normal limit  Feet  Intact  Nl pulses no callous of deformity  SKIN: No acute rashes normal turgor, color, no bruising or petechiae. PSYCH: Oriented, good eye contact, no obvious depression anxiety, cognition and judgment appear normal. LN: no cervical l adenopathy Diabetic Foot Exam - Simple   Simple Foot Form Diabetic Foot exam was performed with the following findings:  Yes 08/04/2017 10:29 AM  Visual Inspection No deformities, no ulcerations, no other skin breakdown bilaterally:  Yes Sensation Testing Intact to touch and monofilament testing bilaterally:  Yes Pulse Check Posterior Tibialis and Dorsalis pulse intact bilaterally:  Yes Comments       Lab Results  Component Value Date   WBC 7.4 07/24/2016   HGB 13.7 07/24/2016   HCT 40.3 07/24/2016   PLT 203.0 07/24/2016   GLUCOSE 144 (H) 07/24/2016   CHOL 178 07/24/2016   TRIG 95.0 07/24/2016   HDL 67.30 07/24/2016   LDLDIRECT 173.9 11/09/2013   LDLCALC 91 07/24/2016   ALT 24 07/24/2016   AST 22 07/24/2016   NA  140 07/24/2016   K 4.9 07/24/2016   CL 102 07/24/2016   CREATININE 0.76 07/24/2016   BUN 12 07/24/2016   CO2  30 07/24/2016   TSH 1.97 07/24/2016   INR 0.93 08/31/2013   HGBA1C 6.8 01/22/2017   MICROALBUR <0.7 07/24/2016   BP Readings from Last 3 Encounters:  08/04/17 120/72  06/17/17 110/64  01/22/17 128/68    ASSESSMENT AND PLAN:  Discussed the following assessment and plan:  Type 2 diabetes mellitus without complication, without long-term current use of insulin (HCC) - Plan: Lipid panel, CBC with Differential/Platelet, Comprehensive metabolic panel, TSH, Hemoglobin A1c  Medication management - Plan: Lipid panel, CBC with Differential/Platelet, Comprehensive metabolic panel, TSH, Hemoglobin A1c  Hyperlipidemia, unspecified hyperlipidemia type - Plan: Lipid panel, CBC with Differential/Platelet, Comprehensive metabolic panel, TSH, Hemoglobin A1c  Essential hypertension - Plan: Lipid panel, CBC with Differential/Platelet, Comprehensive metabolic panel, TSH, Hemoglobin A1c  HX: breast cancer - Plan: Lipid panel, CBC with Differential/Platelet, Comprehensive metabolic panel, TSH, Hemoglobin A1c  OSA on CPAP - Plan: Lipid panel, CBC with Differential/Platelet, Comprehensive metabolic panel, TSH, Hemoglobin A1c  Need for influenza vaccination - Plan: Flu vaccine HIGH DOSE PF (Fluzone High dose)  Osteoporosis without current pathological fracture, unspecified osteoporosis type - Plan: Lipid panel, CBC with Differential/Platelet, Comprehensive metabolic panel, TSH, Hemoglobin A1c  URI with cough and congestion  -Patient advised to return or notify health care team  if  new concerns arise. Respiratory difficulty sinus problems now could be viral on antibiotic clindamycin for teeth need to follow-up with oral surgery as planned chest exam is reassuring today. Plan fasting lab work 44-month RV with A1c any other maintenance things updated as appropriate. She still works full-time and  we discussed a [place] may not have time to do that and at this point declines. Cautious use of benzos as we discussed patient aware.  Rare use. Patient Instructions  Simple saline nose spray should be safe    Get OS to fu about   Sinus  Area concern. Chest exam is good today .   Get appt for fasting labs and I will put in the order       Stanton K. Panosh M.D.

## 2017-08-04 ENCOUNTER — Ambulatory Visit (INDEPENDENT_AMBULATORY_CARE_PROVIDER_SITE_OTHER): Payer: Medicare Other | Admitting: Internal Medicine

## 2017-08-04 ENCOUNTER — Encounter: Payer: Self-pay | Admitting: Internal Medicine

## 2017-08-04 VITALS — BP 120/72 | HR 103 | Temp 98.6°F | Wt 191.8 lb

## 2017-08-04 DIAGNOSIS — Z9989 Dependence on other enabling machines and devices: Secondary | ICD-10-CM

## 2017-08-04 DIAGNOSIS — E785 Hyperlipidemia, unspecified: Secondary | ICD-10-CM

## 2017-08-04 DIAGNOSIS — E119 Type 2 diabetes mellitus without complications: Secondary | ICD-10-CM | POA: Diagnosis not present

## 2017-08-04 DIAGNOSIS — Z23 Encounter for immunization: Secondary | ICD-10-CM

## 2017-08-04 DIAGNOSIS — Z853 Personal history of malignant neoplasm of breast: Secondary | ICD-10-CM

## 2017-08-04 DIAGNOSIS — I10 Essential (primary) hypertension: Secondary | ICD-10-CM | POA: Diagnosis not present

## 2017-08-04 DIAGNOSIS — G4733 Obstructive sleep apnea (adult) (pediatric): Secondary | ICD-10-CM | POA: Diagnosis not present

## 2017-08-04 DIAGNOSIS — J069 Acute upper respiratory infection, unspecified: Secondary | ICD-10-CM | POA: Diagnosis not present

## 2017-08-04 DIAGNOSIS — M810 Age-related osteoporosis without current pathological fracture: Secondary | ICD-10-CM

## 2017-08-04 DIAGNOSIS — Z79899 Other long term (current) drug therapy: Secondary | ICD-10-CM | POA: Diagnosis not present

## 2017-08-04 MED ORDER — ALPRAZOLAM 0.25 MG PO TABS: 0.2500 mg | ORAL_TABLET | Freq: Every evening | ORAL | 0 refills | Status: DC | PRN

## 2017-08-04 NOTE — Patient Instructions (Signed)
Simple saline nose spray should be safe    Get OS to fu about   Sinus  Area concern. Chest exam is good today .   Get appt for fasting labs and I will put in the order

## 2017-08-07 ENCOUNTER — Other Ambulatory Visit (INDEPENDENT_AMBULATORY_CARE_PROVIDER_SITE_OTHER): Payer: Medicare Other

## 2017-08-07 DIAGNOSIS — E785 Hyperlipidemia, unspecified: Secondary | ICD-10-CM | POA: Diagnosis not present

## 2017-08-07 DIAGNOSIS — M810 Age-related osteoporosis without current pathological fracture: Secondary | ICD-10-CM

## 2017-08-07 DIAGNOSIS — Z79899 Other long term (current) drug therapy: Secondary | ICD-10-CM

## 2017-08-07 DIAGNOSIS — Z9989 Dependence on other enabling machines and devices: Secondary | ICD-10-CM | POA: Diagnosis not present

## 2017-08-07 DIAGNOSIS — Z853 Personal history of malignant neoplasm of breast: Secondary | ICD-10-CM

## 2017-08-07 DIAGNOSIS — G4733 Obstructive sleep apnea (adult) (pediatric): Secondary | ICD-10-CM | POA: Diagnosis not present

## 2017-08-07 DIAGNOSIS — I10 Essential (primary) hypertension: Secondary | ICD-10-CM

## 2017-08-07 DIAGNOSIS — E119 Type 2 diabetes mellitus without complications: Secondary | ICD-10-CM | POA: Diagnosis not present

## 2017-08-07 LAB — COMPREHENSIVE METABOLIC PANEL
ALT: 23 U/L (ref 0–35)
AST: 22 U/L (ref 0–37)
Albumin: 4.5 g/dL (ref 3.5–5.2)
Alkaline Phosphatase: 49 U/L (ref 39–117)
BUN: 11 mg/dL (ref 6–23)
CO2: 30 mEq/L (ref 19–32)
Calcium: 9.9 mg/dL (ref 8.4–10.5)
Chloride: 102 mEq/L (ref 96–112)
Creatinine, Ser: 0.67 mg/dL (ref 0.40–1.20)
GFR: 92.4 mL/min (ref >60.00)
Glucose, Bld: 158 mg/dL — ABNORMAL HIGH (ref 70–99)
Potassium: 4.6 mEq/L (ref 3.5–5.1)
Sodium: 142 mEq/L (ref 135–145)
Total Bilirubin: 0.7 mg/dL (ref 0.2–1.2)
Total Protein: 6.9 g/dL (ref 6.0–8.3)

## 2017-08-07 LAB — CBC WITH DIFFERENTIAL/PLATELET
BASOS PCT: 0.6 % (ref 0.0–3.0)
Basophils Absolute: 0 10*3/uL (ref 0.0–0.1)
EOS PCT: 2.7 % (ref 0.0–5.0)
Eosinophils Absolute: 0.2 10*3/uL (ref 0.0–0.7)
HCT: 39.4 % (ref 36.0–46.0)
HEMOGLOBIN: 13.4 g/dL (ref 12.0–15.0)
LYMPHS ABS: 1.7 10*3/uL (ref 0.7–4.0)
Lymphocytes Relative: 22.3 % (ref 12.0–46.0)
MCHC: 34.1 g/dL (ref 30.0–36.0)
MCV: 91.4 fl (ref 78.0–100.0)
MONO ABS: 0.5 10*3/uL (ref 0.1–1.0)
Monocytes Relative: 6.5 % (ref 3.0–12.0)
NEUTROS PCT: 67.9 % (ref 43.0–77.0)
Neutro Abs: 5.1 10*3/uL (ref 1.4–7.7)
Platelets: 216 10*3/uL (ref 150.0–400.0)
RBC: 4.31 Mil/uL (ref 3.87–5.11)
RDW: 13.3 % (ref 11.5–15.5)
WBC: 7.5 10*3/uL (ref 4.0–10.5)

## 2017-08-07 LAB — LIPID PANEL
Cholesterol: 143 mg/dL (ref 0–200)
HDL: 62.2 mg/dL (ref >39.00)
LDL Cholesterol: 59 mg/dL (ref 0–99)
NonHDL: 80.62
Total CHOL/HDL Ratio: 2
Triglycerides: 106 mg/dL (ref 0.0–149.0)
VLDL: 21.2 mg/dL (ref 0.0–40.0)

## 2017-08-07 LAB — HEMOGLOBIN A1C: Hgb A1c MFr Bld: 7.1 % — ABNORMAL HIGH (ref 4.6–6.5)

## 2017-08-07 LAB — TSH: TSH: 2.24 u[IU]/mL (ref 0.35–4.50)

## 2017-08-11 ENCOUNTER — Telehealth: Payer: Self-pay | Admitting: Internal Medicine

## 2017-08-11 MED ORDER — DOXYCYCLINE HYCLATE 100 MG PO TABS: 100.0000 mg | ORAL_TABLET | Freq: Two times a day (BID) | ORAL | 0 refills | Status: DC

## 2017-08-11 NOTE — Telephone Encounter (Signed)
Pt aware of rec's per Dr Regis Bill.  Doxy sent to pharmacy Pt states that she has already finished the Clindamycin. Pt states that she saw the Surgeon yesterday and he did not show much concern so she will let us know if her symptoms persist or worsen and she will then consider ENT.  Pt will let us know if not seeing improvement by Friday as she has surgery scheduled for Tuesday 08/18/17. Will send to Dr Regis Bill as Juluis Rainier.  Nothing further needed.

## 2017-08-11 NOTE — Telephone Encounter (Signed)
Pt still having sinus sx's - stuffy nose, coughing and congestion. Pt states that when she blew her nose this morning there was blood mixed with her mucus. Pt is scheduled for eye surgery next week and needs this cleared up before then. Using Mucinex Sinus PRN - not seeing much difference with this.  Fever x 3 days of 99-101.  Last noted fever was 2 days ago.  Pt requesting rec's of something else to take for symptom relief.   Please advise Dr Regis Bill, thanks.

## 2017-08-11 NOTE — Telephone Encounter (Signed)
Can send in doxycycline 100 mg 1 p.o. twice daily dispense 14 for presumed persistent sinusitis. She would stop the clindamycin if she has not finished it already. This treatment would be for bacterial sinusitis. However if she is having continued problems may need to see ear nose and throat or her older surgeon because of the problem with the tooth extraction.

## 2017-08-27 DIAGNOSIS — H43813 Vitreous degeneration, bilateral: Secondary | ICD-10-CM | POA: Diagnosis not present

## 2017-08-27 DIAGNOSIS — H02831 Dermatochalasis of right upper eyelid: Secondary | ICD-10-CM | POA: Diagnosis not present

## 2017-08-27 DIAGNOSIS — H02834 Dermatochalasis of left upper eyelid: Secondary | ICD-10-CM | POA: Diagnosis not present

## 2017-08-27 DIAGNOSIS — E119 Type 2 diabetes mellitus without complications: Secondary | ICD-10-CM | POA: Diagnosis not present

## 2017-08-27 DIAGNOSIS — Z961 Presence of intraocular lens: Secondary | ICD-10-CM | POA: Diagnosis not present

## 2017-08-27 DIAGNOSIS — H40013 Open angle with borderline findings, low risk, bilateral: Secondary | ICD-10-CM | POA: Diagnosis not present

## 2017-09-15 DIAGNOSIS — H04163 Lacrimal gland dislocation, bilateral lacrimal glands: Secondary | ICD-10-CM | POA: Diagnosis not present

## 2017-09-15 DIAGNOSIS — H02831 Dermatochalasis of right upper eyelid: Secondary | ICD-10-CM | POA: Diagnosis not present

## 2017-09-15 DIAGNOSIS — H02413 Mechanical ptosis of bilateral eyelids: Secondary | ICD-10-CM | POA: Diagnosis not present

## 2017-09-15 DIAGNOSIS — H02834 Dermatochalasis of left upper eyelid: Secondary | ICD-10-CM | POA: Diagnosis not present

## 2017-09-30 DIAGNOSIS — C50812 Malignant neoplasm of overlapping sites of left female breast: Secondary | ICD-10-CM | POA: Diagnosis not present

## 2017-09-30 DIAGNOSIS — C50912 Malignant neoplasm of unspecified site of left female breast: Secondary | ICD-10-CM | POA: Diagnosis not present

## 2017-10-02 DIAGNOSIS — Z91040 Latex allergy status: Secondary | ICD-10-CM | POA: Diagnosis not present

## 2017-10-02 DIAGNOSIS — Z888 Allergy status to other drugs, medicaments and biological substances status: Secondary | ICD-10-CM | POA: Diagnosis not present

## 2017-10-02 DIAGNOSIS — Z79899 Other long term (current) drug therapy: Secondary | ICD-10-CM | POA: Diagnosis not present

## 2017-10-02 DIAGNOSIS — Z885 Allergy status to narcotic agent status: Secondary | ICD-10-CM | POA: Diagnosis not present

## 2017-10-02 DIAGNOSIS — T386X5A Adverse effect of antigonadotrophins, antiestrogens, antiandrogens, not elsewhere classified, initial encounter: Secondary | ICD-10-CM | POA: Diagnosis not present

## 2017-10-02 DIAGNOSIS — I10 Essential (primary) hypertension: Secondary | ICD-10-CM | POA: Diagnosis not present

## 2017-10-02 DIAGNOSIS — C50912 Malignant neoplasm of unspecified site of left female breast: Secondary | ICD-10-CM | POA: Diagnosis not present

## 2017-10-02 DIAGNOSIS — Z7984 Long term (current) use of oral hypoglycemic drugs: Secondary | ICD-10-CM | POA: Diagnosis not present

## 2017-10-02 DIAGNOSIS — Z886 Allergy status to analgesic agent status: Secondary | ICD-10-CM | POA: Diagnosis not present

## 2017-10-02 DIAGNOSIS — Z9012 Acquired absence of left breast and nipple: Secondary | ICD-10-CM | POA: Diagnosis not present

## 2017-10-02 DIAGNOSIS — E785 Hyperlipidemia, unspecified: Secondary | ICD-10-CM | POA: Diagnosis not present

## 2017-10-02 DIAGNOSIS — Z923 Personal history of irradiation: Secondary | ICD-10-CM | POA: Diagnosis not present

## 2017-10-02 DIAGNOSIS — Z79811 Long term (current) use of aromatase inhibitors: Secondary | ICD-10-CM | POA: Diagnosis not present

## 2017-10-02 DIAGNOSIS — Z880 Allergy status to penicillin: Secondary | ICD-10-CM | POA: Diagnosis not present

## 2017-10-02 DIAGNOSIS — Z170 Estrogen receptor positive status [ER+]: Secondary | ICD-10-CM | POA: Diagnosis not present

## 2017-10-02 DIAGNOSIS — M818 Other osteoporosis without current pathological fracture: Secondary | ICD-10-CM | POA: Diagnosis not present

## 2017-10-08 DIAGNOSIS — H43813 Vitreous degeneration, bilateral: Secondary | ICD-10-CM | POA: Diagnosis not present

## 2017-10-08 DIAGNOSIS — H02831 Dermatochalasis of right upper eyelid: Secondary | ICD-10-CM | POA: Diagnosis not present

## 2017-10-08 DIAGNOSIS — H02834 Dermatochalasis of left upper eyelid: Secondary | ICD-10-CM | POA: Diagnosis not present

## 2017-10-08 DIAGNOSIS — H2512 Age-related nuclear cataract, left eye: Secondary | ICD-10-CM | POA: Diagnosis not present

## 2017-10-08 DIAGNOSIS — H40013 Open angle with borderline findings, low risk, bilateral: Secondary | ICD-10-CM | POA: Diagnosis not present

## 2017-10-08 DIAGNOSIS — H2511 Age-related nuclear cataract, right eye: Secondary | ICD-10-CM | POA: Diagnosis not present

## 2017-10-08 DIAGNOSIS — E119 Type 2 diabetes mellitus without complications: Secondary | ICD-10-CM | POA: Diagnosis not present

## 2017-10-15 DIAGNOSIS — Z9012 Acquired absence of left breast and nipple: Secondary | ICD-10-CM | POA: Diagnosis not present

## 2017-10-15 DIAGNOSIS — Z853 Personal history of malignant neoplasm of breast: Secondary | ICD-10-CM | POA: Diagnosis not present

## 2017-10-15 DIAGNOSIS — Z08 Encounter for follow-up examination after completed treatment for malignant neoplasm: Secondary | ICD-10-CM | POA: Diagnosis not present

## 2017-11-03 DIAGNOSIS — R918 Other nonspecific abnormal finding of lung field: Secondary | ICD-10-CM | POA: Diagnosis not present

## 2017-11-03 DIAGNOSIS — R928 Other abnormal and inconclusive findings on diagnostic imaging of breast: Secondary | ICD-10-CM | POA: Diagnosis not present

## 2017-11-10 DIAGNOSIS — G4733 Obstructive sleep apnea (adult) (pediatric): Secondary | ICD-10-CM | POA: Diagnosis not present

## 2017-11-11 DIAGNOSIS — G4733 Obstructive sleep apnea (adult) (pediatric): Secondary | ICD-10-CM | POA: Diagnosis not present

## 2017-11-20 DIAGNOSIS — Z9882 Breast implant status: Secondary | ICD-10-CM | POA: Diagnosis not present

## 2017-12-10 DIAGNOSIS — G4733 Obstructive sleep apnea (adult) (pediatric): Secondary | ICD-10-CM | POA: Diagnosis not present

## 2017-12-16 NOTE — Progress Notes (Signed)
This encounter was created in error - please disregard.

## 2017-12-21 NOTE — Progress Notes (Signed)
This encounter was created in error - please disregard.

## 2018-01-06 DIAGNOSIS — M9902 Segmental and somatic dysfunction of thoracic region: Secondary | ICD-10-CM | POA: Diagnosis not present

## 2018-01-06 DIAGNOSIS — M546 Pain in thoracic spine: Secondary | ICD-10-CM | POA: Diagnosis not present

## 2018-01-06 DIAGNOSIS — M9901 Segmental and somatic dysfunction of cervical region: Secondary | ICD-10-CM | POA: Diagnosis not present

## 2018-01-06 DIAGNOSIS — M542 Cervicalgia: Secondary | ICD-10-CM | POA: Diagnosis not present

## 2018-01-12 DIAGNOSIS — M9902 Segmental and somatic dysfunction of thoracic region: Secondary | ICD-10-CM | POA: Diagnosis not present

## 2018-01-12 DIAGNOSIS — M542 Cervicalgia: Secondary | ICD-10-CM | POA: Diagnosis not present

## 2018-01-12 DIAGNOSIS — M546 Pain in thoracic spine: Secondary | ICD-10-CM | POA: Diagnosis not present

## 2018-01-12 DIAGNOSIS — M9901 Segmental and somatic dysfunction of cervical region: Secondary | ICD-10-CM | POA: Diagnosis not present

## 2018-01-18 DIAGNOSIS — H04563 Stenosis of bilateral lacrimal punctum: Secondary | ICD-10-CM | POA: Diagnosis not present

## 2018-01-18 DIAGNOSIS — H02135 Senile ectropion of left lower eyelid: Secondary | ICD-10-CM | POA: Diagnosis not present

## 2018-01-18 DIAGNOSIS — H04223 Epiphora due to insufficient drainage, bilateral lacrimal glands: Secondary | ICD-10-CM | POA: Diagnosis not present

## 2018-01-18 DIAGNOSIS — H02532 Eyelid retraction right lower eyelid: Secondary | ICD-10-CM | POA: Diagnosis not present

## 2018-01-18 DIAGNOSIS — H02132 Senile ectropion of right lower eyelid: Secondary | ICD-10-CM | POA: Diagnosis not present

## 2018-01-23 ENCOUNTER — Other Ambulatory Visit: Payer: Self-pay | Admitting: Internal Medicine

## 2018-02-02 ENCOUNTER — Ambulatory Visit: Payer: Self-pay | Admitting: Internal Medicine

## 2018-02-10 NOTE — Progress Notes (Signed)
Chief Complaint  Patient presents with  . Follow-up    Pt is fasting for labs today. Pt not checking blood sugars at home so she is unsure what her readings have been.     HPI: Chelsey [name] 72 y.o. come in for Chronic disease management  Had bum p  Right foot on top and tender nodule on arch  No injury  Had neg bone scan in December and no recurrance breast cancer .  DM    Not checking bg  BP ok  LIPIDS Breast cancer     Sp    8 years .  ocass [place] alprazolam . ocass  Needs refill as needed  trazadone helps also  7 days   Per week but   Has    Control.  Relator   ROS: See pertinent positives and negatives per HPI.  Past Medical History:  Diagnosis Date  . Allergy   . Aphthous ulcer of mouth 2010   related to chemotherapy   . Breast cancer ([place])   . Carpal tunnel syndrome of right wrist   . Colitis   . Colon polyp   . Diabetes mellitus   . Diverticulosis   . GERD (gastroesophageal reflux disease)   . Hyperlipidemia   . Hypertension   . Lymphedema 08-31-13   left arm  . Osteoarthritis   . Pneumonia 07/28/2011  . Sinusitis   . Sleep apnea    setting of 10  . Squamous cell carcinoma    left leg    Family History  Problem Relation Age of Onset  . Alzheimer's disease Mother   . Sleep apnea Mother   . Diabetes Mother   . Hypertension Mother   . Hyperlipidemia Mother   . Breast cancer Mother 66       triple negative  . Anxiety disorder Daughter   . Hypertension Son        pulmonary  . Congenital heart disease Son 0       TGA  died age 24     Social History   Socioeconomic History  . Marital status: Married    Spouse name: Not on file  . Number of children: 3  . Years of education: Not on file  . Highest education level: Not on file  Occupational History  . Occupation: Architectural technologist: [employer]  . 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 Financial resource strain: Not on file  . Food insecurity:    Worry: Not on file    Inability: Not on file  . Transportation needs:    Medical: Not on file    Non-medical: Not on file  Tobacco Use  . Smoking status: Never Smoker  . Smokeless tobacco: Never Used  Substance and Sexual Activity  . Alcohol use: Yes    Alcohol/week: 3.6 oz    Types: 6 Glasses of wine per week    Comment: 1-2 glasses every other night  . Drug use: No  . Sexual activity: Yes    Birth control/protection: Post-menopausal, Surgical  Lifestyle  . Physical activity:    Days per week: Not on file    Minutes per session: Not on file  . Stress: Not on file  Relationships  . Social connections:    Talks on phone: Not on file    Gets together: Not on file    Attends religious service: Not on file    Active member of club  or organization: Not on file    Attends meetings of clubs or organizations: Not on file    Relationship status: Not on file  Other Topics Concern  . Not on file  Social History Narrative   cb x 3   Realtor married    Bereaved  Parent   2 adult children   Mother with dementia and breast cancer   Passed away sept 61 15  Age 78    Outpatient Medications Prior to Visit  Medication Sig Dispense Refill  . ALPRAZolam (XANAX) 0.25 MG tablet Take 1 tablet (0.25 mg total) by mouth at bedtime as needed for anxiety. 30 tablet 0  . anastrozole (ARIMIDEX) 1 MG tablet Take 1 tablet (1 mg total) by mouth daily. 90 tablet 5  . azelastine (ASTELIN) 0.1 % nasal spray 1-2 puffs each nostril twice daily as needed 30 mL 12  . denosumab (PROLIA) 60 MG/ML SOLN injection Inject 60 mg into the skin every 6 (six) months. Administer in upper arm, thigh, or abdomen    . esomeprazole (NEXIUM) 40 MG capsule Take 40 mg by mouth as needed. Takes bedtime    . losartan (COZAAR) 50 MG tablet TAKE 1 TABLET BY MOUTH ONCE DAILY 90 tablet 0  . metFORMIN (GLUCOPHAGE-XR) 500 MG 24 hr tablet TAKE 2 TABLETS BY MOUTH TWICE A DAY WITH MEALS 360 tablet 0  . rosuvastatin (CRESTOR) 10 MG tablet Take 1 tablet (10 mg  total) by mouth daily. 90 tablet 3  . sertraline (ZOLOFT) 25 MG tablet take 1 tablet by mouth once daily 90 tablet 3  . traZODone (DESYREL) 50 MG tablet TAKE 1/2 TO 1 TABLET BY MOUTH AT BEDTIME IF NEEDED FOR SLEEP 30 tablet 6  . chlorhexidine (PERIDEX) 0.12 % solution SWISH AND SPIT 1/2 OUNCE BID FOR 2MINUTES AS DIRECTED  0  . clindamycin (CLEOCIN) 150 MG capsule Take 1 capsule by mouth 4 (four) times daily.  0  . doxycycline (VIBRA-TABS) 100 MG tablet Take 1 tablet (100 mg total) by mouth 2 (two) times daily. (Patient not taking: Reported on 02/11/2018) 14 tablet 0   No facility-administered medications prior to visit.      EXAM:  BP 112/70 (BP Location: Right Arm, Patient Position: Sitting, Cuff Size: Large)   Pulse 68   Temp 98.9 F (37.2 C) (Oral)   Wt 194 lb 12.8 oz (88.4 kg)   BMI 33.44 kg/m   Body mass index is 33.44 kg/m.  GENERAL: vitals reviewed and listed above, alert, oriented, appears well hydrated and in no acute distress HEENT: atraumatic, conjunctiva  clear, no obvious abnormalities on inspection of external nose and ears OP : no lesion edema or exudate  NECK: no obvious masses on inspection palpation  LUNGS: clear to auscultation bilaterally, no wheezes, rales or rhonchi, good air movement CV: HRRR, no clubbing cyanosis or  peripheral edema nl cap refill  Abdomen:  Sof,t normal bowel sounds without hepatosplenomegaly, no guarding rebound or masses no CVA tenderness MS: moves all extremities without noticeable focal  Abnormality  Left wrist  Ganglion  Right foot  Top of foot ? 2 cm ganglion   Arch pebble type   Nodule  No redness no skin changes   PSYCH: pleasant and cooperative, no obvious depression or anxiety Lab Results  Component Value Date   WBC 7.5 08/07/2017   HGB 13.4 08/07/2017   HCT 39.4 08/07/2017   PLT 216.0 08/07/2017   GLUCOSE 158 (H) 08/07/2017   CHOL 143 08/07/2017  TRIG 106.0 08/07/2017   HDL 62.20 08/07/2017   LDLDIRECT 173.9 11/09/2013    LDLCALC 59 08/07/2017   ALT 23 08/07/2017   AST 22 08/07/2017   NA 142 08/07/2017   K 4.6 08/07/2017   CL 102 08/07/2017   CREATININE 0.67 08/07/2017   BUN 11 08/07/2017   CO2 30 08/07/2017   TSH 2.24 08/07/2017   INR 0.93 08/31/2013   HGBA1C 7.1 (H) 08/07/2017   MICROALBUR <0.7 07/24/2016   BP Readings from Last 3 Encounters:  02/11/18 112/70  08/04/17 120/72  06/17/17 110/64    ASSESSMENT AND PLAN:  Discussed the following assessment and plan:  Type 2 diabetes mellitus without complication, without long-term current use of insulin (HCC) - Plan: Hemoglobin A1c  Medication management  Hyperlipidemia, unspecified hyperlipidemia type - Plan: Lipid panel  Essential hypertension  OSA on CPAP  Vitamin D deficiency - Plan: VITAMIN D 25 Hydroxy (Vit-D Deficiency, Fractures)  Ganglion - foot  ? wrist with  dplantar nodule also   HX: breast cancer internet  Down  During visit    Plan rov 6 mos   Pneumovax next visit wants to delay  See ortho about the nodules seem benign cause at  Refill med as planned  -Patient advised to return or notify health care team  if  new concerns arise.  There are no Patient Instructions on file for this visit.   Standley Brooking. Panosh M.D.

## 2018-02-11 ENCOUNTER — Ambulatory Visit (INDEPENDENT_AMBULATORY_CARE_PROVIDER_SITE_OTHER): Payer: Medicare Other | Admitting: Internal Medicine

## 2018-02-11 ENCOUNTER — Encounter: Payer: Self-pay | Admitting: Internal Medicine

## 2018-02-11 VITALS — BP 112/70 | HR 68 | Temp 98.9°F | Wt 194.8 lb

## 2018-02-11 DIAGNOSIS — M6740 Ganglion, unspecified site: Secondary | ICD-10-CM | POA: Diagnosis not present

## 2018-02-11 DIAGNOSIS — Z79899 Other long term (current) drug therapy: Secondary | ICD-10-CM | POA: Diagnosis not present

## 2018-02-11 DIAGNOSIS — E785 Hyperlipidemia, unspecified: Secondary | ICD-10-CM

## 2018-02-11 DIAGNOSIS — E559 Vitamin D deficiency, unspecified: Secondary | ICD-10-CM | POA: Diagnosis not present

## 2018-02-11 DIAGNOSIS — E119 Type 2 diabetes mellitus without complications: Secondary | ICD-10-CM

## 2018-02-11 DIAGNOSIS — Z853 Personal history of malignant neoplasm of breast: Secondary | ICD-10-CM | POA: Diagnosis not present

## 2018-02-11 DIAGNOSIS — Z9989 Dependence on other enabling machines and devices: Secondary | ICD-10-CM | POA: Diagnosis not present

## 2018-02-11 DIAGNOSIS — I10 Essential (primary) hypertension: Secondary | ICD-10-CM

## 2018-02-11 DIAGNOSIS — G4733 Obstructive sleep apnea (adult) (pediatric): Secondary | ICD-10-CM | POA: Diagnosis not present

## 2018-02-11 LAB — LIPID PANEL
Cholesterol: 168 mg/dL (ref 0–200)
HDL: 61.5 mg/dL (ref >39.00)
LDL Cholesterol: 86 mg/dL (ref 0–99)
NonHDL: 106.01
Total CHOL/HDL Ratio: 3
Triglycerides: 99 mg/dL (ref 0.0–149.0)
VLDL: 19.8 mg/dL (ref 0.0–40.0)

## 2018-02-11 LAB — VITAMIN D 25 HYDROXY (VIT D DEFICIENCY, FRACTURES): VITD: 39.23 ng/mL (ref 30.00–100.00)

## 2018-02-11 LAB — HEMOGLOBIN A1C: HEMOGLOBIN A1C: 7.2 % — AB (ref 4.6–6.5)

## 2018-02-11 MED ORDER — ALPRAZOLAM 0.25 MG PO TABS: 0.2500 mg | ORAL_TABLET | Freq: Every evening | ORAL | 0 refills | Status: DC | PRN

## 2018-02-11 MED ORDER — TRAZODONE HCL 50 MG PO TABS: ORAL_TABLET | ORAL | 6 refills | Status: DC

## 2018-02-12 DIAGNOSIS — G4733 Obstructive sleep apnea (adult) (pediatric): Secondary | ICD-10-CM | POA: Diagnosis not present

## 2018-02-26 DIAGNOSIS — D225 Melanocytic nevi of trunk: Secondary | ICD-10-CM | POA: Diagnosis not present

## 2018-02-26 DIAGNOSIS — D1801 Hemangioma of skin and subcutaneous tissue: Secondary | ICD-10-CM | POA: Diagnosis not present

## 2018-02-26 DIAGNOSIS — Z85828 Personal history of other malignant neoplasm of skin: Secondary | ICD-10-CM | POA: Diagnosis not present

## 2018-02-26 DIAGNOSIS — L821 Other seborrheic keratosis: Secondary | ICD-10-CM | POA: Diagnosis not present

## 2018-02-26 DIAGNOSIS — L570 Actinic keratosis: Secondary | ICD-10-CM | POA: Diagnosis not present

## 2018-02-26 DIAGNOSIS — L814 Other melanin hyperpigmentation: Secondary | ICD-10-CM | POA: Diagnosis not present

## 2018-03-12 ENCOUNTER — Other Ambulatory Visit: Payer: Self-pay | Admitting: Internal Medicine

## 2018-04-09 DIAGNOSIS — J302 Other seasonal allergic rhinitis: Secondary | ICD-10-CM | POA: Diagnosis not present

## 2018-04-09 DIAGNOSIS — M818 Other osteoporosis without current pathological fracture: Secondary | ICD-10-CM | POA: Diagnosis not present

## 2018-04-09 DIAGNOSIS — Z9012 Acquired absence of left breast and nipple: Secondary | ICD-10-CM | POA: Diagnosis not present

## 2018-04-09 DIAGNOSIS — C50912 Malignant neoplasm of unspecified site of left female breast: Secondary | ICD-10-CM | POA: Diagnosis not present

## 2018-04-09 DIAGNOSIS — N952 Postmenopausal atrophic vaginitis: Secondary | ICD-10-CM | POA: Diagnosis not present

## 2018-04-09 DIAGNOSIS — Z853 Personal history of malignant neoplasm of breast: Secondary | ICD-10-CM | POA: Diagnosis not present

## 2018-04-10 ENCOUNTER — Other Ambulatory Visit: Payer: Self-pay | Admitting: Internal Medicine

## 2018-04-16 ENCOUNTER — Other Ambulatory Visit: Payer: Self-pay | Admitting: Internal Medicine

## 2018-05-16 ENCOUNTER — Other Ambulatory Visit: Payer: Self-pay | Admitting: Internal Medicine

## 2018-06-04 DIAGNOSIS — E119 Type 2 diabetes mellitus without complications: Secondary | ICD-10-CM | POA: Diagnosis not present

## 2018-06-04 DIAGNOSIS — H04123 Dry eye syndrome of bilateral lacrimal glands: Secondary | ICD-10-CM | POA: Diagnosis not present

## 2018-06-04 DIAGNOSIS — H40013 Open angle with borderline findings, low risk, bilateral: Secondary | ICD-10-CM | POA: Diagnosis not present

## 2018-06-04 DIAGNOSIS — H43813 Vitreous degeneration, bilateral: Secondary | ICD-10-CM | POA: Diagnosis not present

## 2018-06-04 DIAGNOSIS — H10413 Chronic giant papillary conjunctivitis, bilateral: Secondary | ICD-10-CM | POA: Diagnosis not present

## 2018-06-04 LAB — HM DIABETES EYE EXAM

## 2018-06-08 ENCOUNTER — Encounter: Payer: Self-pay | Admitting: Internal Medicine

## 2018-06-08 DIAGNOSIS — R300 Dysuria: Secondary | ICD-10-CM | POA: Diagnosis not present

## 2018-06-17 ENCOUNTER — Ambulatory Visit: Payer: Medicare Other | Admitting: Internal Medicine

## 2018-06-17 DIAGNOSIS — G4733 Obstructive sleep apnea (adult) (pediatric): Secondary | ICD-10-CM | POA: Diagnosis not present

## 2018-06-29 ENCOUNTER — Other Ambulatory Visit: Payer: Self-pay | Admitting: Internal Medicine

## 2018-06-30 DIAGNOSIS — L820 Inflamed seborrheic keratosis: Secondary | ICD-10-CM | POA: Diagnosis not present

## 2018-06-30 DIAGNOSIS — L280 Lichen simplex chronicus: Secondary | ICD-10-CM | POA: Diagnosis not present

## 2018-07-12 ENCOUNTER — Other Ambulatory Visit: Payer: Self-pay

## 2018-07-12 NOTE — Patient Outreach (Signed)
[facility]) Care Management  07/12/2018  CAMRIE STOCK [birth year] [id number]   Medication Adherence call to Mrs. Chelsey [name] spoke with patient she is taking 1 tablet every other day per doctor's new instructions patient is due on Rosuvastatin 10 mg. Chelsey [name] is showing past due under [place].  [place] Management Direct Dial [phone]  Fax [phone] [email or web address]

## 2018-07-15 ENCOUNTER — Other Ambulatory Visit: Payer: Self-pay | Admitting: Internal Medicine

## 2018-08-14 ENCOUNTER — Other Ambulatory Visit: Payer: Self-pay | Admitting: Internal Medicine

## 2018-08-25 ENCOUNTER — Ambulatory Visit: Payer: Medicare Other | Admitting: Internal Medicine

## 2018-08-26 ENCOUNTER — Encounter: Payer: Self-pay | Admitting: Internal Medicine

## 2018-08-26 ENCOUNTER — Ambulatory Visit: Payer: Medicare Other | Admitting: Internal Medicine

## 2018-08-26 VITALS — BP 124/68 | HR 72 | Ht 64.0 in | Wt 192.2 lb

## 2018-08-26 DIAGNOSIS — E662 Morbid (severe) obesity with alveolar hypoventilation: Secondary | ICD-10-CM

## 2018-08-26 DIAGNOSIS — Z23 Encounter for immunization: Secondary | ICD-10-CM

## 2018-08-26 DIAGNOSIS — G4733 Obstructive sleep apnea (adult) (pediatric): Secondary | ICD-10-CM

## 2018-08-26 NOTE — Patient Instructions (Addendum)
Order- DME [facility]- please refit mask of choice, continue CPAP auto 5-15, humidifier, supplies, AirView  Order- referral to sleep center for mask fitting  Consider checking on-line sources like [email or web address].com for mask options  Order- flu vax senior  Please call if we can help

## 2018-08-26 NOTE — Progress Notes (Signed)
HPI female never smoker here with husband. History OSA/CPAP, complicated by history breast cancer, HBP, allergic rhinitis NPSG 05/06/19 AHI 47.7/hr,   ----------------------------------------------------------------------------------------------------- 06/17/17- 72 year old female never smoker followed for history OSA/CPAP, complicated by history breast cancer, HBP, allergic rhinitis, DM 2  cpap auto 5-15/ [facility]  FOLLOWS FOR:  pt is wanting to change mask with [facility].  wearing her cpap about 7-8 hours per night.  no complaints She continues to benefit from CPAP and is very compliant but finds mask bothersome-pulls and blows. Says she has not been able to get an alternative through [facility] that suited her. Download 100% averaging almost 9 hours per night, AHI 1.6/hour. Perennial nasal stuffiness. Sample Dymista nasal spray had helped. Also troubled by itching and drainage from eyes identified by her eye doctor as "allergy".  08/26/2018- 72 year old female never smoker followed for history OSA/CPAP, complicated by history breast cancer, HBP, allergic rhinitis, DM 2  cpap auto 5-15/ [facility] -----pt wearing cpap avg 8hr nightly- would like to try a different mask. waking feeling well rest. DME:[facility] Download 100% compliance AHI 1.6/hour Discussed mask comfort, styles available and options for better fit.  ROS-see HPI     + = positive Constitutional:   No-   weight loss, night sweats, + fevers, chills, fatigue, lassitude. HEENT:   + headaches, difficulty swallowing, tooth/dental problems, sore throat,       No-  sneezing, +itching, ear ache, +nasal congestion, +post nasal drip,  CV:  No-   chest pain, orthopnea, PND, swelling in lower extremities, anasarca, dizziness, palpitations Resp: No-   shortness of breath with exertion or at rest.            productive cough,  No non-productive cough,  No- coughing up of blood.           change in color of mucus.  No- wheezing.   Skin: No-   rash or  lesions. GI:  No-   heartburn, indigestion, abdominal pain, nausea, vomiting,  GU:  MS:  + joint pain or swelling.   Neuro-     nothing unusual Psych:  No- change in mood or affect. No depression or anxiety.  No memory loss.  OBJ- Physical Exam    General- Alert, Oriented, Affect-appropriate, Distress- none acute, + overweight Skin- rash-none, lesions- none, excoriation- none Lymphadenopathy- none Head- atraumatic            Eyes- Gross vision intact, PERRLA, conjunctivae and secretions clear            Ears- Hearing, canals-normal            Nose- + prominent turbinates, Septal dev +mild, no-mucus, polyps, erosion, perforation             Throat- Mallampati III-IV, mucosa clear , drainage- none, tonsils- atrophic, own teeth Neck- flexible , trachea midline, no stridor , thyroid nl, carotid no bruit Chest - symmetrical excursion , unlabored           Heart/CV- RRR , no murmur , no gallop  , no rub, nl s1 s2                           - JVD- none , edema- none, stasis changes- none, varices- none           Lung- clear, wheeze- none, cough- none , dullness-none, rub- none           Chest wall-  Abd-  Br/ Gen/ Rectal- Not  done, not indicated Extrem- cyanosis- none, clubbing, none, atrophy- none, strength- nl Neuro- grossly intact to observation

## 2018-09-13 ENCOUNTER — Ambulatory Visit (INDEPENDENT_AMBULATORY_CARE_PROVIDER_SITE_OTHER): Payer: Medicare Other | Admitting: Internal Medicine

## 2018-09-13 ENCOUNTER — Encounter: Payer: Self-pay | Admitting: Internal Medicine

## 2018-09-13 VITALS — BP 118/60 | HR 66 | Temp 98.1°F | Wt 193.8 lb

## 2018-09-13 DIAGNOSIS — I10 Essential (primary) hypertension: Secondary | ICD-10-CM | POA: Diagnosis not present

## 2018-09-13 DIAGNOSIS — S46911A Strain of unspecified muscle, fascia and tendon at shoulder and upper arm level, right arm, initial encounter: Secondary | ICD-10-CM

## 2018-09-13 DIAGNOSIS — E785 Hyperlipidemia, unspecified: Secondary | ICD-10-CM

## 2018-09-13 DIAGNOSIS — C50912 Malignant neoplasm of unspecified site of left female breast: Secondary | ICD-10-CM | POA: Diagnosis not present

## 2018-09-13 DIAGNOSIS — E119 Type 2 diabetes mellitus without complications: Secondary | ICD-10-CM | POA: Diagnosis not present

## 2018-09-13 DIAGNOSIS — Z79899 Other long term (current) drug therapy: Secondary | ICD-10-CM | POA: Diagnosis not present

## 2018-09-13 LAB — HEPATIC FUNCTION PANEL
ALT: 25 U/L (ref 0–35)
AST: 23 U/L (ref 0–37)
Albumin: 4.5 g/dL (ref 3.5–5.2)
Alkaline Phosphatase: 44 U/L (ref 39–117)
Bilirubin, Direct: 0.1 mg/dL (ref 0.0–0.3)
TOTAL PROTEIN: 6.9 g/dL (ref 6.0–8.3)
Total Bilirubin: 0.6 mg/dL (ref 0.2–1.2)

## 2018-09-13 LAB — LIPID PANEL
Cholesterol: 195 mg/dL (ref 0–200)
HDL: 64.9 mg/dL (ref >39.00)
LDL Cholesterol: 102 mg/dL — ABNORMAL HIGH (ref 0–99)
NonHDL: 129.91
TRIGLYCERIDES: 142 mg/dL (ref 0.0–149.0)
Total CHOL/HDL Ratio: 3
VLDL: 28.4 mg/dL (ref 0.0–40.0)

## 2018-09-13 LAB — BASIC METABOLIC PANEL
BUN: 13 mg/dL (ref 6–23)
CO2: 29 meq/L (ref 19–32)
Calcium: 10.2 mg/dL (ref 8.4–10.5)
Chloride: 103 mEq/L (ref 96–112)
Creatinine, Ser: 0.7 mg/dL (ref 0.40–1.20)
GFR: 87.57 mL/min (ref >60.00)
Glucose, Bld: 172 mg/dL — ABNORMAL HIGH (ref 70–99)
Potassium: 4.6 mEq/L (ref 3.5–5.1)
Sodium: 140 mEq/L (ref 135–145)

## 2018-09-13 LAB — HEMOGLOBIN A1C: HEMOGLOBIN A1C: 7.2 % — AB (ref 4.6–6.5)

## 2018-09-13 LAB — MICROALBUMIN / CREATININE URINE RATIO
Creatinine,U: 29.6 mg/dL
Microalb Creat Ratio: 2.4 mg/g (ref 0.0–30.0)
Microalb, Ur: <0.7 mg/dL (ref 0.0–1.9)

## 2018-09-13 MED ORDER — TRAZODONE HCL 50 MG PO TABS: ORAL_TABLET | ORAL | 6 refills | Status: DC

## 2018-09-13 MED ORDER — ALPRAZOLAM 0.25 MG PO TABS: 0.2500 mg | ORAL_TABLET | Freq: Every evening | ORAL | 0 refills | Status: DC | PRN

## 2018-09-13 MED ORDER — METHOCARBAMOL 500 MG PO TABS: 500.0000 mg | ORAL_TABLET | Freq: Three times a day (TID) | ORAL | 0 refills | Status: DC | PRN

## 2018-09-13 NOTE — Patient Instructions (Addendum)
Will notify you  of labs when available.   consider shingles vaccine  .  Caution  With  Muscle relaxants .  May need to see your ortho or sports medicine for   then shoulder strain .

## 2018-09-13 NOTE — Progress Notes (Signed)
Chief Complaint  Patient presents with  . Follow-up    Does not check BS routinely.   . Hypertension  . Hyperlipidemia  . Diabetes  . Medication Management    HPI: Chelsey [name] 72 y.o. come in for Chronic disease management .   DM :not checking at this t ime   No eye and  Neuropathy . Sx taken metformin  Without se   BP: controlled  No se   HLD: med no se reported but only taking  3 x per week at this time  OSA :  Doing ok   New mask planned     Right  shoulder neck sore since lifted  A corn hole  pank  Sore  And sometimes at night awakens for  Weeks  r hand donminabnt and favors left cause of breast cancer surgery  Asks for   musckel relaxant at night to help sleep   uses trazodone to helpsleep   ocass use of alprazolan if very anxious  30 at most every 6 months still has   A few left from last rx   ROS: See pertinent positives and negatives per HPI.  [facility]  Cancer eval  In January   Past Medical History:  Diagnosis Date  . Allergy   . Aphthous ulcer of mouth 2010   related to chemotherapy   . Breast cancer ([place])   . Carpal tunnel syndrome of right wrist   . Colitis   . Colon polyp   . Diabetes mellitus   . Diverticulosis   . GERD (gastroesophageal reflux disease)   . Hyperlipidemia   . Hypertension   . Lymphedema 08-31-13   left arm  . Osteoarthritis   . Pneumonia 07/28/2011  . Sinusitis   . Sleep apnea    setting of 10  . Squamous cell carcinoma    left leg    Family History  Problem Relation Age of Onset  . Alzheimer's disease Mother   . Sleep apnea Mother   . Diabetes Mother   . Hypertension Mother   . Hyperlipidemia Mother   . Breast cancer Mother 60       triple negative  . Anxiety disorder Daughter   . Hypertension Son        pulmonary  . Congenital heart disease Son 0       TGA  died age 77     Social History   Socioeconomic History  . Marital status: Married    Spouse name: Not on file  . Number of children: 3  . Years  of education: Not on file  . Highest education level: Not on file  Occupational History  . Occupation: Architectural technologist: [employer]  . 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 Financial resource strain: Not on file  . Food insecurity:    Worry: Not on file    Inability: Not on file  . Transportation needs:    Medical: Not on file    Non-medical: Not on file  Tobacco Use  . Smoking status: Never Smoker  . Smokeless tobacco: Never Used  Substance and Sexual Activity  . Alcohol use: Yes    Alcohol/week: 6.0 standard drinks    Types: 6 Glasses of wine per week    Comment: 1-2 glasses every other night  . Drug use: No  . Sexual activity: Yes    Birth control/protection: Post-menopausal, Surgical  Lifestyle  . Physical activity:  Days per week: Not on file    Minutes per session: Not on file  . Stress: Not on file  Relationships  . Social connections:    Talks on phone: Not on file    Gets together: Not on file    Attends religious service: Not on file    Active member of club or organization: Not on file    Attends meetings of clubs or organizations: Not on file    Relationship status: Not on file  Other Topics Concern  . Not on file  Social History Narrative   cb x 3   Realtor married    Bereaved  Parent   2 adult children   Mother with dementia and breast cancer   Passed away sept 66 15  Age 96    Outpatient Medications Prior to Visit  Medication Sig Dispense Refill  . anastrozole (ARIMIDEX) 1 MG tablet Take 1 tablet (1 mg total) by mouth daily. 90 tablet 5  . azelastine (ASTELIN) 0.1 % nasal spray INSTILL 1 TO 2 SPRAYS INTO EACH NOSTRIL TWICE DAILY AS NEEDED 30 mL 5  . denosumab (PROLIA) 60 MG/ML SOLN injection Inject 60 mg into the skin every 6 (six) months. Administer in upper arm, thigh, or abdomen    . esomeprazole (NEXIUM) 40 MG capsule Take 40 mg by mouth as needed. Takes bedtime    . losartan (COZAAR) 50 MG tablet TAKE 1  TABLET BY MOUTH ONCE DAILY 90 tablet 1  . metFORMIN (GLUCOPHAGE-XR) 500 MG 24 hr tablet TAKE 2 TABLETS BY MOUTH TWICE A DAY WITH MEALS 360 tablet 0  . rosuvastatin (CRESTOR) 10 MG tablet TAKE 1 TABLET BY MOUTH ONCE DAILY 90 tablet 0  . sertraline (ZOLOFT) 25 MG tablet TAKE 1 TABLET BY MOUTH ONCE DAILY 90 tablet 0  . ALPRAZolam (XANAX) 0.25 MG tablet Take 1 tablet (0.25 mg total) by mouth at bedtime as needed for anxiety. 30 tablet 0  . traZODone (DESYREL) 50 MG tablet TAKE 1/2 TO 1 TABLET BY MOUTH AT BEDTIME IF NEEDED FOR SLEEP 30 tablet 6   No facility-administered medications prior to visit.      EXAM:  BP 118/60 (BP Location: Right Arm, Patient Position: Sitting, Cuff Size: Normal)   Pulse 66   Temp 98.1 F (36.7 C) (Oral)   Wt 193 lb 12.8 oz (87.9 kg)   BMI 33.27 kg/m   Body mass index is 33.27 kg/m.  GENERAL: vitals reviewed and listed above, alert, oriented, appears well hydrated and in no acute distress HEENT: atraumatic, conjunctiva  clear, no obvious abnormalities on inspection of external nose and ears OP : no lesion edema or exudate  NECK: no obvious masses on inspection palpation  LUNGS: clear to auscultation bilaterally, no wheezes, rales or rhonchi, good air movement CV: HRRR, no clubbing cyanosis or  peripheral edema nl cap refill  Diabetic Foot Exam - Simple   Simple Foot Form Diabetic Foot exam was performed with the following findings:  Yes 09/13/2018 10:08 AM  Visual Inspection No deformities, no ulcerations, no other skin breakdown bilaterally:  Yes Sensation Testing Intact to touch and monofilament testing bilaterally:  Yes Pulse Check Posterior Tibialis and Dorsalis pulse intact bilaterally:  Yes Comments Cyst  On top of right foot      MS: moves all extremities without noticeable focal  Abnormality  djd  Right shoulder   Tender at rc no acute finding PSYCH: pleasant and cooperative, no obvious depression or anxiety Lab Results  Component  Value Date     WBC 7.5 08/07/2017   HGB 13.4 08/07/2017   HCT 39.4 08/07/2017   PLT 216.0 08/07/2017   GLUCOSE 172 (H) 09/13/2018   CHOL 195 09/13/2018   TRIG 142.0 09/13/2018   HDL 64.90 09/13/2018   LDLDIRECT 173.9 11/09/2013   LDLCALC 102 (H) 09/13/2018   ALT 25 09/13/2018   AST 23 09/13/2018   NA 140 09/13/2018   K 4.6 09/13/2018   CL 103 09/13/2018   CREATININE 0.70 09/13/2018   BUN 13 09/13/2018   CO2 29 09/13/2018   TSH 2.24 08/07/2017   INR 0.93 08/31/2013   HGBA1C 7.2 (H) 09/13/2018   MICROALBUR <0.7 09/13/2018   BP Readings from Last 3 Encounters:  09/13/18 118/60  08/26/18 124/68  02/11/18 112/70   Fasting today ASSESSMENT AND PLAN:  Discussed the following assessment and plan:  Diabetes mellitus without complication ([place]) - Plan: Hemoglobin V4M, Basic metabolic panel, Lipid panel, Hepatic function panel, Microalbumin / creatinine urine ratio  Medication management - cautious use of  high risk meds  disc - Plan: Hemoglobin G8Q, Basic metabolic panel, Lipid panel, Hepatic function panel, Microalbumin / creatinine urine ratio  Hyperlipidemia, unspecified hyperlipidemia type - Plan: Hemoglobin P6P, Basic metabolic panel, Lipid panel, Hepatic function panel, Microalbumin / creatinine urine ratio  Essential hypertension - Plan: Hemoglobin P5K, Basic metabolic panel, Lipid panel, Hepatic function panel, Microalbumin / creatinine urine ratio  Malignant neoplasm of left female breast, unspecified estrogen receptor status, unspecified site of breast ([place])  Strain of right shoulder, initial encounter  6 mos   cpx or ov  -Patient advised to return or notify health care team  if  new concerns arise.  Patient Instructions  Will notify you  of labs when available.   consider shingles vaccine  .  Caution  With  Muscle relaxants .  May need to see your ortho or sports medicine for   then shoulder strain .        Standley Brooking. Rhyse Skowron M.D.

## 2018-09-21 ENCOUNTER — Telehealth: Payer: Self-pay | Admitting: Internal Medicine

## 2018-09-21 NOTE — Telephone Encounter (Signed)
Copied from [place] [id number]. Topic: General - Inquiry >> Sep 21, 2018 10:31 AM Margot Ables wrote: Reason for CRM: pt called to follow up on lab results. Pt is going to attempt to sign up for mychart but would like a call back with results

## 2018-09-22 ENCOUNTER — Other Ambulatory Visit (HOSPITAL_BASED_OUTPATIENT_CLINIC_OR_DEPARTMENT_OTHER): Payer: Medicare Other

## 2018-09-23 NOTE — Telephone Encounter (Signed)
Left detailed message with results. Advised if any questions to call back. Nothing further needed.   Notes recorded by Burnis Medin, MD on 09/17/2018 at 1:16 PM EST Dm control about the same  7.2  Cholesterol not as good  Try to take crestor 4-5 days a week as tolerated to get LDL to goal .   Can repeat this at your 6 months follow up

## 2018-09-28 ENCOUNTER — Other Ambulatory Visit (HOSPITAL_BASED_OUTPATIENT_CLINIC_OR_DEPARTMENT_OTHER): Payer: Medicare Other

## 2018-09-29 ENCOUNTER — Other Ambulatory Visit (HOSPITAL_BASED_OUTPATIENT_CLINIC_OR_DEPARTMENT_OTHER): Payer: Medicare Other

## 2018-10-05 DIAGNOSIS — Z9011 Acquired absence of right breast and nipple: Secondary | ICD-10-CM | POA: Diagnosis not present

## 2018-10-05 DIAGNOSIS — C50912 Malignant neoplasm of unspecified site of left female breast: Secondary | ICD-10-CM | POA: Diagnosis not present

## 2018-10-12 ENCOUNTER — Other Ambulatory Visit: Payer: Self-pay | Admitting: Internal Medicine

## 2018-10-15 DIAGNOSIS — K6389 Other specified diseases of intestine: Secondary | ICD-10-CM | POA: Diagnosis not present

## 2018-10-15 DIAGNOSIS — R918 Other nonspecific abnormal finding of lung field: Secondary | ICD-10-CM | POA: Diagnosis not present

## 2018-10-15 DIAGNOSIS — C50912 Malignant neoplasm of unspecified site of left female breast: Secondary | ICD-10-CM | POA: Diagnosis not present

## 2018-10-22 DIAGNOSIS — R935 Abnormal findings on diagnostic imaging of other abdominal regions, including retroperitoneum: Secondary | ICD-10-CM | POA: Diagnosis not present

## 2018-10-22 DIAGNOSIS — M818 Other osteoporosis without current pathological fracture: Secondary | ICD-10-CM | POA: Diagnosis not present

## 2018-10-22 DIAGNOSIS — M810 Age-related osteoporosis without current pathological fracture: Secondary | ICD-10-CM | POA: Diagnosis not present

## 2018-10-22 DIAGNOSIS — Z170 Estrogen receptor positive status [ER+]: Secondary | ICD-10-CM | POA: Diagnosis not present

## 2018-10-22 DIAGNOSIS — Z79899 Other long term (current) drug therapy: Secondary | ICD-10-CM | POA: Diagnosis not present

## 2018-10-22 DIAGNOSIS — N952 Postmenopausal atrophic vaginitis: Secondary | ICD-10-CM | POA: Diagnosis not present

## 2018-10-22 DIAGNOSIS — C50912 Malignant neoplasm of unspecified site of left female breast: Secondary | ICD-10-CM | POA: Diagnosis not present

## 2018-10-22 DIAGNOSIS — Z9882 Breast implant status: Secondary | ICD-10-CM | POA: Diagnosis not present

## 2018-10-22 DIAGNOSIS — M8580 Other specified disorders of bone density and structure, unspecified site: Secondary | ICD-10-CM | POA: Diagnosis not present

## 2018-10-22 DIAGNOSIS — Z9012 Acquired absence of left breast and nipple: Secondary | ICD-10-CM | POA: Diagnosis not present

## 2018-10-22 DIAGNOSIS — R918 Other nonspecific abnormal finding of lung field: Secondary | ICD-10-CM | POA: Diagnosis not present

## 2018-10-22 DIAGNOSIS — R928 Other abnormal and inconclusive findings on diagnostic imaging of breast: Secondary | ICD-10-CM | POA: Diagnosis not present

## 2018-10-26 ENCOUNTER — Encounter: Payer: Self-pay | Admitting: Gastroenterology

## 2018-10-26 DIAGNOSIS — E662 Morbid (severe) obesity with alveolar hypoventilation: Secondary | ICD-10-CM | POA: Insufficient documentation

## 2018-10-26 NOTE — Assessment & Plan Note (Signed)
Emphasized weight loss goal which would benefit sleep apnea and blood sugar management

## 2018-10-26 NOTE — Assessment & Plan Note (Signed)
She continues to benefit from CPAP with improved sleep.  Download confirms good compliance and control. Plan-refit mask

## 2018-11-02 DIAGNOSIS — Z9882 Breast implant status: Secondary | ICD-10-CM | POA: Diagnosis not present

## 2018-11-15 ENCOUNTER — Other Ambulatory Visit: Payer: Self-pay | Admitting: Internal Medicine

## 2018-11-24 ENCOUNTER — Ambulatory Visit: Payer: Medicare Other | Admitting: Gastroenterology

## 2018-11-24 ENCOUNTER — Encounter: Payer: Self-pay | Admitting: Gastroenterology

## 2018-11-24 VITALS — BP 122/60 | HR 76 | Ht 62.6 in | Wt 192.5 lb

## 2018-11-24 DIAGNOSIS — R9389 Abnormal findings on diagnostic imaging of other specified body structures: Secondary | ICD-10-CM

## 2018-11-24 MED ORDER — SUPREP BOWEL PREP KIT 17.5-3.13-1.6 GM/177ML PO SOLN: ORAL | 0 refills | Status: DC

## 2018-11-24 NOTE — Patient Instructions (Addendum)
If you are age 72 or older, your body mass index should be between 23-30. Your Body mass index is 34.54 kg/m. If this is out of the aforementioned range listed, please consider follow up with your Primary Care Provider.  If you are age 74 or younger, your body mass index should be between 19-25. Your Body mass index is 34.54 kg/m. If this is out of the aformentioned range listed, please consider follow up with your Primary Care Provider.   You have been scheduled for a colonoscopy. Please follow written instructions given to you at your visit today.  Please pick up your prep supplies at the pharmacy within the next 1-3 days. If you use inhalers (even only as needed), please bring them with you on the day of your procedure. Your physician has requested that you go to www.startemmi.com and enter the access code given to you at your visit today. This web site gives a general overview about your procedure. However, you should still follow specific instructions given to you by our office regarding your preparation for the procedure.   Thank you for entrusting me with your care and for choosing [facility], Dr. Thornton Park

## 2018-11-24 NOTE — Progress Notes (Signed)
Referring Provider: Burnis Medin, MD Primary Care Physician:  Burnis Medin, MD   Reason for Consultation:  Abnormal CT scan   IMPRESSION:  Abnormal CT scan 10/15/18    - Hyperdense lesion along the wall of the right colon measuring 2.3 x 1.7 cm    - not seen on contrasted CT 09/30/2018 Pancolonic diverticulosis Chronic constipation BMI 34.54 No history of colon polyps on colonoscopy 2004 or 2014 (Dr. Olevia Perches) No known family history of colon cancer or polyps  CT findings are worrisome for large polyp or mass. Colonoscopy recommended.    PLAN: Colonoscopy  I consented the patient at the bedside today discussing the risks, benefits, and alternatives to endoscopic evaluation. In particular, we discussed the risks that include, but are not limited to, reaction to medication, cardiopulmonary compromise, bleeding requiring blood transfusion, aspiration resulting in pneumonia, perforation requiring surgery, lack of diagnosis, severe illness requiring hospitalization, and even death. We reviewed the risk of missed lesion including polyps or even cancer. The patient acknowledges these risks and asks that we proceed.   HPI: Chelsey [name] is a 73 y.o. female self-referred for an abnormal CT scan. The history is obtained through the patient and review of her electronic health record, as well as CareEverywhere. Known diabetes, hypertension, hyperlipidemia, osteoporosis, and OSA on CPAP. History of stage IIB:T3N0 ER/PR positive, Her2 negative left breast cancer in 03/2009, pathologic stage IIB: yT2N1M0 after neoadjuvant therapy. Currently on Arimidex.  Followed at [facility].    Had a CT abd/pelvis with 10/15/18 obtained for routine surveillance by her oncologist showed 2 new right upper lobe pulmonary nodules as well as a hyperdense lesion along the wall of the right colon measuring 2.3 x 1.7 cm.  I reviewed her CT chest/abd/pelvis with contrast from 09/30/2017 and no colon findings beyond  diverticulosis were seen.   Chronic constipation that primarily occurs when traveling, not recently worse. GI ROS is otherwise negative. No symptoms associated with these findings. No identified exacerbating or relieving features.  Colonoscopy for colon cancer screening with Dr. Olevia Perches in 2004 and 2014. No polyps. Acute, self-limited colitis at time of colonoscopy in 2004. Pancolonic diverticulosis seen on both colonoscopies.    No known family history of colon cancer or colon polyps.    Past Medical History:  Diagnosis Date  . Allergy   . Aphthous ulcer of mouth 2010   related to chemotherapy   . Breast cancer ([place])   . Carpal tunnel syndrome of right wrist   . Colitis   . Colon polyp   . Diabetes mellitus   . Diverticulosis   . GERD (gastroesophageal reflux disease)   . Hyperlipidemia   . Hypertension   . Lymphedema 08-31-13   left arm  . Osteoarthritis   . Pneumonia 07/28/2011  . Sinusitis   . Sleep apnea    setting of 10  . Squamous cell carcinoma    left leg    Past Surgical History:  Procedure Laterality Date  . AUGMENTATION MAMMAPLASTY Left   . CESAREAN SECTION     x 3  . MASTECTOMY Left 11/23   2010  . RECONSTRUCTION BREAST IMMEDIATE / DELAYED W/ TISSUE EXPANDER Left   . REDUCTION MAMMAPLASTY Right   . SKIN GRAFT    . SQUAMOUS CELL CARCINOMA EXCISION Left    Left lower extremity  . TONSILLECTOMY     as child  . TOTAL KNEE ARTHROPLASTY Left 12/20/2012   Procedure: TOTAL KNEE ARTHROPLASTY;  Surgeon: Gearlean Alf,  MD;  Location: [facility];  Service: Orthopedics;  Laterality: Left;  . TOTAL KNEE ARTHROPLASTY Right 09/12/2013   Procedure: RIGHT TOTAL KNEE ARTHROPLASTY;  Surgeon: Gearlean Alf, MD;  Location: [facility];  Service: Orthopedics;  Laterality: Right;  . TUBAL LIGATION      Current Outpatient Medications  Medication Sig Dispense Refill  . ALPRAZolam (XANAX) 0.25 MG tablet Take 1 tablet (0.25 mg total) by mouth at bedtime as needed for anxiety. 30  tablet 0  . anastrozole (ARIMIDEX) 1 MG tablet Take 1 tablet (1 mg total) by mouth daily. 90 tablet 5  . azelastine (ASTELIN) 0.1 % nasal spray INSTILL 1 TO 2 SPRAYS INTO EACH NOSTRIL TWICE DAILY AS NEEDED 30 mL 5  . denosumab (PROLIA) 60 MG/ML SOLN injection Inject 60 mg into the skin every 6 (six) months. Administer in upper arm, thigh, or abdomen    . esomeprazole (NEXIUM) 40 MG capsule Take 40 mg by mouth as needed. Takes bedtime    . losartan (COZAAR) 50 MG tablet TAKE 1 TABLET BY MOUTH ONCE DAILY 90 tablet 1  . metFORMIN (GLUCOPHAGE-XR) 500 MG 24 hr tablet TAKE 2 TABLETS BY MOUTH TWICE A DAY WITH MEALS 360 tablet 0  . methocarbamol (ROBAXIN) 500 MG tablet Take 1 tablet (500 mg total) by mouth every 8 (eight) hours as needed for muscle spasms. 30 tablet 0  . rosuvastatin (CRESTOR) 10 MG tablet TAKE 1 TABLET BY MOUTH ONCE DAILY 90 tablet 0  . sertraline (ZOLOFT) 25 MG tablet TAKE 1 TABLET BY MOUTH ONCE DAILY 90 tablet 0  . traZODone (DESYREL) 50 MG tablet TAKE 1/2 TO 1 TABLET BY MOUTH AT BEDTIME IF NEEDED FOR SLEEP 30 tablet 6   No current facility-administered medications for this visit.     Allergies as of 11/24/2018 - Review Complete 11/24/2018  Allergen Reaction Noted  . Ambien [zolpidem tartrate]  06/08/2014  . Amoxicillin  06/25/2005  . Levaquin [levofloxacin] Other (See Comments) 01/12/2017  . Lipitor [atorvastatin calcium]  01/09/2011  . Penicillins    . Prednisone Itching 12/20/2012  . Latex Rash 12/13/2012  . Mupirocin Rash 04/28/2016  . Other Rash 04/28/2016    Family History  Problem Relation Age of Onset  . Alzheimer's disease Mother   . Sleep apnea Mother   . Diabetes Mother   . Hypertension Mother   . Hyperlipidemia Mother   . Breast cancer Mother 45       triple negative  . Anxiety disorder Daughter   . Hypertension Son        pulmonary  . Congenital heart disease Son 0       TGA  died age 53     Social History   Socioeconomic History  . Marital  status: Married    Spouse name: Not on file  . Number of children: 3  . Years of education: Not on file  . Highest education level: Not on file  Occupational History  . 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 Financial resource strain: Not on file  . Food insecurity:    Worry: Not on file    Inability: Not on file  . Transportation needs:    Medical: Not on file    Non-medical: Not on file  Tobacco Use  . Smoking status: Never Smoker  . Smokeless tobacco: Never Used  Substance and Sexual Activity  . Alcohol use: Yes  Alcohol/week: 6.0 standard drinks    Types: 6 Glasses of wine per week    Comment: 1-2 glasses every other night  . Drug use: No  . Sexual activity: Yes    Birth control/protection: Post-menopausal, Surgical  Lifestyle  . Physical activity:    Days per week: Not on file    Minutes per session: Not on file  . Stress: Not on file  Relationships  . Social connections:    Talks on phone: Not on file    Gets together: Not on file    Attends religious service: Not on file    Active member of club or organization: Not on file    Attends meetings of clubs or organizations: Not on file    Relationship status: Not on file  . Intimate partner violence:    Fear of current or ex partner: Not on file    Emotionally abused: Not on file    Physically abused: Not on file    Forced sexual activity: Not on file  Other Topics Concern  . Not on file  Social History Narrative   cb x 3   Realtor married    Bereaved  Parent   2 adult children   Mother with dementia and breast cancer   Passed away sept 48 15  Age 53    Review of Systems: 12 system ROS is negative except as noted above.  Filed Weights   11/24/18 0956  Weight: 192 lb 8 oz (87.3 kg)    Physical Exam: Vital signs were reviewed. General:   Alert, well-nourished, pleasant and cooperative in NAD Head:  Normocephalic and  atraumatic. Eyes:  Sclera clear, no icterus.   Conjunctiva pink. Mouth:  No deformity or lesions.   Neck:  Supple; no thyromegaly. Lungs:  Clear throughout to auscultation.   No wheezes.  Heart:  Regular rate and rhythm; no murmurs Abdomen:  Soft, nontender, normal bowel sounds. No rebound or guarding. No hepatosplenomegaly Rectal:  Deferred  Msk:  Symmetrical without gross deformities. Extremities:  No gross deformities or edema. Neurologic:  Alert and  oriented x4;  grossly nonfocal Skin:  No rash or bruise. Psych:  Alert and cooperative. Normal mood and affect.   Karenann Mcgrory L. Tarri Glenn, MD, MPH [facility] 11/24/2018, 10:29 AM

## 2018-11-30 ENCOUNTER — Encounter: Payer: Self-pay | Admitting: *Deleted

## 2018-12-02 ENCOUNTER — Ambulatory Visit (AMBULATORY_SURGERY_CENTER): Payer: Medicare Other | Admitting: Gastroenterology

## 2018-12-02 ENCOUNTER — Encounter: Payer: Self-pay | Admitting: Gastroenterology

## 2018-12-02 VITALS — BP 126/70 | HR 47 | Temp 98.0°F | Resp 43 | Ht 62.0 in | Wt 192.0 lb

## 2018-12-02 DIAGNOSIS — R9389 Abnormal findings on diagnostic imaging of other specified body structures: Secondary | ICD-10-CM

## 2018-12-02 DIAGNOSIS — D122 Benign neoplasm of ascending colon: Secondary | ICD-10-CM

## 2018-12-02 DIAGNOSIS — R933 Abnormal findings on diagnostic imaging of other parts of digestive tract: Secondary | ICD-10-CM | POA: Diagnosis not present

## 2018-12-02 DIAGNOSIS — K635 Polyp of colon: Secondary | ICD-10-CM

## 2018-12-02 DIAGNOSIS — D124 Benign neoplasm of descending colon: Secondary | ICD-10-CM | POA: Diagnosis not present

## 2018-12-02 HISTORY — PX: COLONOSCOPY: SHX174

## 2018-12-02 MED ORDER — SODIUM CHLORIDE 0.9 % IV SOLN: 500.0000 mL | Freq: Once | INTRAVENOUS | Status: DC

## 2018-12-02 NOTE — Progress Notes (Signed)
Report given to PACU, vss 

## 2018-12-02 NOTE — Op Note (Addendum)
[facility] Patient Name: Chelsey [name] Procedure Date: 12/02/2018 2:19 PM MRN: [id number] Endoscopist: Thornton Park MD, MD Age: 73 Referring MD:  Date of Birth: 07-16-1946 Gender: Female Account #: [id number] Procedure:                Colonoscopy Indications:              Abnormal CT of the GI tract (2.3 x 1.7 cm in the                            right colon). Normal colonoscopy in 2004 and 2014                            with Dr. Olevia Perches. There is no family history of                            colon cancer or polyps. Medicines:                See the Anesthesia note for documentation of the                            administered medications Procedure:                Pre-Anesthesia Assessment:                           - Prior to the procedure, a History and Physical                            was performed, and patient medications and                            allergies were reviewed. The patient's tolerance of                            previous anesthesia was also reviewed. The risks                            and benefits of the procedure and the sedation                            options and risks were discussed with the patient.                            All questions were answered, and informed consent                            was obtained. Prior Anticoagulants: The patient has                            taken no previous anticoagulant or antiplatelet                            agents. ASA Grade Assessment: II - A patient with  mild systemic disease. After reviewing the risks                            and benefits, the patient was deemed in                            satisfactory condition to undergo the procedure.                           After obtaining informed consent, the colonoscope                            was passed under direct vision. Throughout the                            procedure, the patient's blood pressure, pulse,  and                            oxygen saturations were monitored continuously. The                            Colonoscope was introduced through the anus and                            advanced to the the terminal ileum, with                            identification of the appendiceal orifice and IC                            valve. The colonoscopy was performed without                            difficulty. The patient tolerated the procedure                            well. The quality of the bowel preparation was                            adequate. The terminal ileum, ileocecal valve,                            appendiceal orifice, and rectum were photographed. Scope In: 2:28:20 PM Scope Out: 2:52:41 PM Scope Withdrawal Time: 0 hours 16 minutes 39 seconds  Total Procedure Duration: 0 hours 24 minutes 21 seconds  Findings:                 The perianal and digital rectal examinations were                            normal.                           Multiple small and large-mouthed diverticula were  found in the sigmoid colon and descending colon.                           The appendiceal orifice is full and bulbous                            appearing. The overlying mucosa appeared normal.                            Tunneled biopsies were taken with a cold forceps                            for histology. Estimated blood loss was minimal.                           Six sessile and flat polyps were found in the                            descending colon and ascending colon. The polyps                            were 3 to 9 mm in size. These polyps were removed                            with a cold snare. Resection and retrieval were                            complete. Estimated blood loss was minimal.                           There is a thick, adherent residue throughout the                            colon limiting the evaluation for some small                             lesions. The exam was otherwise without abnormality                            on direct and retroflexion views. Approximately 40                            cm of distal terminal ileum was evaluated and                            appeared normal. Complications:            No immediate complications. Estimated blood loss:                            Minimal. Estimated Blood Loss:     Estimated blood loss was minimal. Impression:               - Diverticulosis in the sigmoid colon and in  the                            descending colon.                           - Nodular mucosa at the appendiceal orifice.                            Biopsied.                           - Six 3 to 9 mm flat polyps in the descending colon                            and in the ascending colon, removed with a cold                            snare. Resected and retrieved.                           - No mass seen in the right colon. The CT scan                            findings may be due to her appendix.                           - The examination was otherwise normal on direct                            and retroflexion views. Recommendation:           - Patient has a contact number available for                            emergencies. The signs and symptoms of potential                            delayed complications were discussed with the                            patient. Return to normal activities tomorrow.                            Written discharge instructions were provided to the                            patient.                           - Resume regular diet today. High fiber diet                            recommended.                           -  Continue present medications.                           - Await pathology results. If biopsy results are                            negative, will repeat CT scan. If repeat CT scan is                            negative, will review  candidacy for miniprobe EUS                            evaluation of the appendiceal orifice.                           - Repeat colonoscopy date to be determined after                            pending pathology results are reviewed for                            surveillance based on pathology results. Given the                            poor prep, will plan a different purgative in the                            future. Thornton Park MD, MD 12/02/2018 3:03:39 PM This report has been signed electronically.

## 2018-12-02 NOTE — Patient Instructions (Signed)
Information on polyps, high fiber diet and diverticulosis given to you today.  Await pathology results.  YOU HAD AN ENDOSCOPIC PROCEDURE TODAY AT [facility]:   Refer to the procedure report that was given to you for any specific questions about what was found during the examination.  If the procedure report does not answer your questions, please call your gastroenterologist to clarify.  If you requested that your care partner not be given the details of your procedure findings, then the procedure report has been included in a sealed envelope for you to review at your convenience later.  YOU SHOULD EXPECT: Some feelings of bloating in the abdomen. Passage of more gas than usual.  Walking can help get rid of the air that was put into your GI tract during the procedure and reduce the bloating. If you had a lower endoscopy (such as a colonoscopy or flexible sigmoidoscopy) you may notice spotting of blood in your stool or on the toilet paper. If you underwent a bowel prep for your procedure, you may not have a normal bowel movement for a few days.  Please Note:  You might notice some irritation and congestion in your nose or some drainage.  This is from the oxygen used during your procedure.  There is no need for concern and it should clear up in a day or so.  SYMPTOMS TO REPORT IMMEDIATELY:   Following lower endoscopy (colonoscopy or flexible sigmoidoscopy):  Excessive amounts of blood in the stool  Significant tenderness or worsening of abdominal pains  Swelling of the abdomen that is new, acute  Fever of 100F or higher   For urgent or emergent issues, a gastroenterologist can be reached at any hour by calling [phone].   DIET:  We do recommend a small meal at first, but then you may proceed to your regular diet.  Drink plenty of fluids but you should avoid alcoholic beverages for 24 hours.  ACTIVITY:  You should plan to take it easy for the rest of today and you should  NOT DRIVE or use heavy machinery until tomorrow (because of the sedation medicines used during the test).    FOLLOW UP: Our staff will call the number listed on your records the next business day following your procedure to check on you and address any questions or concerns that you may have regarding the information given to you following your procedure. If we do not reach you, we will leave a message.  However, if you are feeling well and you are not experiencing any problems, there is no need to return our call.  We will assume that you have returned to your regular daily activities without incident.  If any biopsies were taken you will be contacted by phone or by letter within the next 1-3 weeks.  Please call us at [phone] if you have not heard about the biopsies in 3 weeks.    SIGNATURES/CONFIDENTIALITY: You and/or your care partner have signed paperwork which will be entered into your electronic medical record.  These signatures attest to the fact that that the information above on your After Visit Summary has been reviewed and is understood.  Full responsibility of the confidentiality of this discharge information lies with you and/or your care-partner.

## 2018-12-03 ENCOUNTER — Telehealth: Payer: Self-pay

## 2018-12-03 NOTE — Telephone Encounter (Signed)
  Follow up Call-  Call back number 12/02/2018  Post procedure Call Back phone  # [phone]  Permission to leave phone message Yes  Some recent data might be hidden     Patient questions:  Do you have a fever, pain , or abdominal swelling? No. Pain Score  0 *  Have you tolerated food without any problems? Yes.    Have you been able to return to your normal activities? Yes.    Do you have any questions about your discharge instructions: Diet   No. Medications  No. Follow up visit  No.  Do you have questions or concerns about your Care? No.  Actions: * If pain score is 4 or above: No action needed, pain <4.

## 2018-12-09 ENCOUNTER — Telehealth: Payer: Self-pay | Admitting: Gastroenterology

## 2018-12-09 NOTE — Telephone Encounter (Signed)
Patient asking for results from colonoscopy. Please review and advise

## 2018-12-09 NOTE — Telephone Encounter (Signed)
Results documented elsewhere. Thank you.

## 2018-12-09 NOTE — Telephone Encounter (Signed)
Pt had colon 2.20.20.  Pt inquired about path results from colon.

## 2018-12-13 ENCOUNTER — Ambulatory Visit: Payer: Medicare Other | Admitting: Nurse Practitioner

## 2018-12-13 ENCOUNTER — Ambulatory Visit (INDEPENDENT_AMBULATORY_CARE_PROVIDER_SITE_OTHER)
Admission: RE | Admit: 2018-12-13 | Discharge: 2018-12-13 | Disposition: A | Discharge status: Home / Self Care | Payer: Medicare Other | Source: Ambulatory Visit | Attending: Nurse Practitioner | Admitting: Nurse Practitioner

## 2018-12-13 ENCOUNTER — Encounter: Payer: Self-pay | Admitting: Nurse Practitioner

## 2018-12-13 VITALS — BP 120/66 | HR 84 | Temp 99.3°F | Ht 62.0 in | Wt 188.2 lb

## 2018-12-13 DIAGNOSIS — J0100 Acute maxillary sinusitis, unspecified: Secondary | ICD-10-CM | POA: Diagnosis not present

## 2018-12-13 DIAGNOSIS — R05 Cough: Secondary | ICD-10-CM | POA: Diagnosis not present

## 2018-12-13 DIAGNOSIS — R509 Fever, unspecified: Secondary | ICD-10-CM

## 2018-12-13 DIAGNOSIS — R059 Cough, unspecified: Secondary | ICD-10-CM

## 2018-12-13 DIAGNOSIS — J40 Bronchitis, not specified as acute or chronic: Secondary | ICD-10-CM

## 2018-12-13 LAB — RESPIRATORY VIRUS PANEL
Influenza A RNA: NOT DETECTED
Influenza B RNA: NOT DETECTED
RSV RNA: NOT DETECTED
hMPV: NOT DETECTED

## 2018-12-13 MED ORDER — METHYLPREDNISOLONE ACETATE 80 MG/ML IJ SUSP
80.0000 mg | Freq: Once | INTRAMUSCULAR | Status: AC
  Administered 2018-12-13: 80 mg via INTRAMUSCULAR

## 2018-12-13 NOTE — Progress Notes (Signed)
@Patient  ID: Chelsey [name], female    DOB: [birth year], 73 y.o.   MRN: [id number]  Chief Complaint  Patient presents with  . Acute Visit    Pt c/o sinus pressure, congestion, cough with green sputum off and on for the past month.  She states her ears feel stopped up.     Referring provider: Burnis Medin, MD   HPI 73 year old female never smoker with OSA/CPAP is followed by Dr. Annamaria Boots. PMH includes history of breast cancer, hypertension, allergic rhinitis  Tests: NPSG 05/06/19 AHI 47.7/hr  CXR 01/13/17 - Increased interstitial markings bilaterally may reflect acute bronchitis or early pneumonitis. There is no alveolar pneumonia nor CHF. Thoracic aortic atherosclerosis.  OV 12/13/18 - acute sinus pressure cough and chest congestion with fever Patient presents today for acute office visit.  She states that she has been sick on and off for about a month now.  She states that her current symptoms started back 1 week ago.  Patient complains of bilateral ear pain with sinus pressure and pain.  She denies any sore throat.  She does complain of postnasal drip.  She does have a low grade fever in the office today.  She states that she gets sick like this around once per year around this time of year and it is usually bronchitis, but she does have a history of pneumonia as well.  Patient does state that she has had recent travel. She went on a cruise to the [place] at the beginning of February and then she flew to [place] around a week or so ago.      Allergies  Allergen Reactions  . Ambien [Zolpidem Tartrate]     Felt bad hard to function  . Amoxicillin     REACTION: unspecified  . Levaquin [Levofloxacin] Other (See Comments)    Myalgias  . Lipitor [Atorvastatin Calcium]     Leg cramps  . Penicillins     As child  . Prednisone Itching  . Latex Rash  . Mupirocin Rash  . Other Rash    use PAPER tape    Immunization History  Administered Date(s) Administered  . Influenza, High Dose  Seasonal PF 07/02/2015, 07/24/2016, 08/04/2017, 08/26/2018  . Influenza,inj,Quad PF,6+ Mos 06/23/2013  . Pneumococcal Conjugate-13 11/24/2013  . Td 10/03/2010    Past Medical History:  Diagnosis Date  . Allergy   . Aphthous ulcer of mouth 2010   related to chemotherapy   . Breast cancer ([place])   . Carpal tunnel syndrome of right wrist   . Colitis   . Colon polyp   . Diabetes mellitus   . Diverticulosis   . GERD (gastroesophageal reflux disease)   . Hyperlipidemia   . Hypertension   . Lymphedema 08-31-13   left arm  . Osteoarthritis   . Pneumonia 07/28/2011  . Sinusitis   . Sleep apnea    setting of 10  . Squamous cell carcinoma    left leg    Tobacco History: Social History   Tobacco Use  Smoking Status Never Smoker  Smokeless Tobacco Never Used   Counseling given: Yes   Outpatient Encounter Medications as of 12/13/2018  Medication Sig  . ALPRAZolam (XANAX) 0.25 MG tablet Take 1 tablet (0.25 mg total) by mouth at bedtime as needed for anxiety.  Marland Kitchen anastrozole (ARIMIDEX) 1 MG tablet Take 1 tablet (1 mg total) by mouth daily.  Marland Kitchen azelastine (ASTELIN) 0.1 % nasal spray INSTILL 1 TO 2 SPRAYS INTO EACH NOSTRIL TWICE DAILY  AS NEEDED  . denosumab (PROLIA) 60 MG/ML SOLN injection Inject 60 mg into the skin every 6 (six) months. Administer in upper arm, thigh, or abdomen  . esomeprazole (NEXIUM) 40 MG capsule Take 40 mg by mouth as needed. Takes bedtime  . losartan (COZAAR) 50 MG tablet TAKE 1 TABLET BY MOUTH ONCE DAILY  . metFORMIN (GLUCOPHAGE-XR) 500 MG 24 hr tablet TAKE 2 TABLETS BY MOUTH TWICE A DAY WITH MEALS  . methocarbamol (ROBAXIN) 500 MG tablet Take 1 tablet (500 mg total) by mouth every 8 (eight) hours as needed for muscle spasms.  . rosuvastatin (CRESTOR) 10 MG tablet TAKE 1 TABLET BY MOUTH ONCE DAILY  . sertraline (ZOLOFT) 25 MG tablet TAKE 1 TABLET BY MOUTH ONCE DAILY  . traZODone (DESYREL) 50 MG tablet TAKE 1/2 TO 1 TABLET BY MOUTH AT BEDTIME IF NEEDED FOR SLEEP    . [EXPIRED] methylPREDNISolone acetate (DEPO-MEDROL) injection 80 mg    No facility-administered encounter medications on file as of 12/13/2018.      Review of Systems  Review of Systems  Constitutional: Positive for chills and fever.  HENT: Positive for congestion, postnasal drip, sinus pressure, sinus pain and sore throat.   Respiratory: Positive for cough and shortness of breath.   Cardiovascular: Negative.  Negative for chest pain, palpitations and leg swelling.  Gastrointestinal: Negative.   Allergic/Immunologic: Negative.   Neurological: Negative.   Psychiatric/Behavioral: Negative.        Physical Exam  BP 120/66 (BP Location: Left Arm, Cuff Size: Normal)   Pulse 84   Temp 99.3 F (37.4 C) (Oral)   Ht 5\' 2"  (1.575 m)   Wt 188 lb 3.2 oz (85.4 kg)   SpO2 95%   BMI 34.42 kg/m   Wt Readings from Last 5 Encounters:  12/13/18 188 lb 3.2 oz (85.4 kg)  12/02/18 192 lb (87.1 kg)  11/24/18 192 lb 8 oz (87.3 kg)  09/13/18 193 lb 12.8 oz (87.9 kg)  08/26/18 192 lb 3.2 oz (87.2 kg)      Physical Exam Vitals signs and nursing note reviewed.  Constitutional:      General: She is not in acute distress.    Appearance: She is well-developed.  HENT:     Nose:     Right Sinus: Maxillary sinus tenderness and frontal sinus tenderness present.     Left Sinus: Maxillary sinus tenderness and frontal sinus tenderness present.     Mouth/Throat:     Pharynx: Posterior oropharyngeal erythema present. No oropharyngeal exudate.  Cardiovascular:     Rate and Rhythm: Normal rate and regular rhythm.  Pulmonary:     Effort: Pulmonary effort is normal. No respiratory distress.     Breath sounds: Normal breath sounds. No wheezing or rhonchi.  Musculoskeletal:        General: No swelling.  Neurological:     Mental Status: She is alert and oriented to person, place, and time.     Imaging: Dg Chest 2 View  Result Date: 12/13/2018 CLINICAL DATA:  Cough and fever. EXAM: CHEST - 2 VIEW  COMPARISON:  Chest radiograph January 12, 2017 FINDINGS: Cardiac silhouette is upper limits of normal in size. Mildly calcified aortic arch. Chronic interstitial changes with bilateral costophrenic angle pleural thickening. No pleural effusion. No pneumothorax. Surgical clips LEFT axilla. IMPRESSION: 1. Stable cardiomegaly and chronic interstitial changes. 2.  Aortic Atherosclerosis (ICD10-I70.0). Electronically Signed   By: Elon Alas M.D.   On: 12/13/2018 16:06     Assessment & Plan:  Bronchitis  Fever, unspecified fever cause - Plan: DG Chest 2 View, methylPREDNISolone acetate (DEPO-MEDROL) injection 80 mg, Respiratory virus panel, CANCELED: Respiratory virus panel  Cough - Plan: DG Chest 2 View, methylPREDNISolone acetate (DEPO-MEDROL) injection 80 mg, Respiratory virus panel, CANCELED: Respiratory virus panel  Acute non-recurrent maxillary sinusitis   Acute non-recurrent maxillary sinusitis Patient presents today for acute visit with cough chest congestion and sinus pressure and pain.  She does have a low-grade fever in the office today.  Respiratory viral panel, chest x-ray.  Patient Instructions  Will order chest xray Will order respiratory viral panel Will order doxycycline DepoMedrol given in office today Tylenol as needed for fever Continue current medications Follow up in 2 weeks or sooner if needed     Bronchitis Patient Instructions  Will order chest xray Will order respiratory viral panel Will order doxycycline DepoMedrol given in office today Tylenol as needed for fever Continue current medications Follow up in 2 weeks or sooner if needed        Fenton Foy, NP 12/13/2018

## 2018-12-13 NOTE — Assessment & Plan Note (Signed)
Patient Instructions  Will order chest xray Will order respiratory viral panel Will order doxycycline DepoMedrol given in office today Tylenol as needed for fever Continue current medications Follow up in 2 weeks or sooner if needed

## 2018-12-13 NOTE — Assessment & Plan Note (Signed)
Patient presents today for acute visit with cough chest congestion and sinus pressure and pain.  She does have a low-grade fever in the office today.  Respiratory viral panel, chest x-ray.  Patient Instructions  Will order chest xray Will order respiratory viral panel Will order doxycycline DepoMedrol given in office today Tylenol as needed for fever Continue current medications Follow up in 2 weeks or sooner if needed

## 2018-12-13 NOTE — Patient Instructions (Signed)
Will order chest xray Will order respiratory viral panel Will order doxycycline DepoMedrol given in office today Tylenol as needed for fever Continue current medications Follow up in 2 weeks or sooner if needed

## 2018-12-14 ENCOUNTER — Telehealth: Payer: Self-pay | Admitting: Nurse Practitioner

## 2018-12-14 MED ORDER — DOXYCYCLINE HYCLATE 100 MG PO TABS: 100.0000 mg | ORAL_TABLET | Freq: Two times a day (BID) | ORAL | 0 refills | Status: DC

## 2018-12-14 NOTE — Telephone Encounter (Signed)
Called pt and advised message from the provider. Pt understood and verbalized understanding. Nothing further is needed.   I called pharmacy and they stated the Rx was there and they will get the Rx ready. Pt made aware.

## 2018-12-14 NOTE — Addendum Note (Signed)
Addended by: Fenton Foy on: 12/14/2018 11:12 AM   Modules accepted: Orders

## 2018-12-14 NOTE — Telephone Encounter (Signed)
Called and spoke with patient she was returning the phone call from [place]. Results for both the cxr and respiratory viral panel were given. Patient verbalized understanding. While on the phone with the patient she stated that the doxycycline was never called in. She would like for it to go to [facility] in [place] on the [address] of [address].   [place] please advise, thank you.

## 2018-12-14 NOTE — Telephone Encounter (Signed)
I sent the doxycycline in, but it might have went to the wrong pharmacy. Please check and send it to the correct pharmacy. Thanks.

## 2018-12-24 ENCOUNTER — Other Ambulatory Visit: Payer: Self-pay | Admitting: Internal Medicine

## 2018-12-27 ENCOUNTER — Encounter: Payer: Self-pay | Admitting: Primary Care

## 2018-12-27 ENCOUNTER — Other Ambulatory Visit: Payer: Self-pay

## 2018-12-27 ENCOUNTER — Ambulatory Visit (INDEPENDENT_AMBULATORY_CARE_PROVIDER_SITE_OTHER): Payer: Medicare Other | Admitting: Primary Care

## 2018-12-27 ENCOUNTER — Ambulatory Visit: Payer: Medicare Other | Admitting: Adult Health

## 2018-12-27 DIAGNOSIS — J40 Bronchitis, not specified as acute or chronic: Secondary | ICD-10-CM

## 2018-12-27 DIAGNOSIS — R9389 Abnormal findings on diagnostic imaging of other specified body structures: Secondary | ICD-10-CM

## 2018-12-27 MED ORDER — AZELASTINE HCL 0.1 % NA SOLN: NASAL | 5 refills | Status: DC

## 2018-12-27 NOTE — Assessment & Plan Note (Addendum)
-   Resolved after Doxycycline course - CXR 12/13/18 showed stable cardiomegaly and chronic interstitial changes

## 2018-12-27 NOTE — Patient Instructions (Addendum)
CXR showed chronic interstitial changes with pleural thickening  Keep appointment for Chest CT with [facility], see if you can get a copy   Follow up with Dr. Annamaria Boots after chest CT to review   Recommend you take Zyrtec daily and nasal spray during allergy season  Return if symptoms re-present or worsen  ________________________________________________________________________________  It is important you stay healthy the next 2-4 weeks, recommend you self isolate so you do not come in contact with anyone that has Corona virus. If you develop FEVER, cough, shortness of breath please call our office to speak with triage nurse or present to ED with mask  Your daughter can schedule a virtual visit through [place] website Collection site- [place] building located at [place]

## 2018-12-27 NOTE — Progress Notes (Signed)
@Patient  ID: Chelsey [name], female    DOB: 11-11-1945, 73 y.o.   MRN: [id number]  Chief Complaint  Patient presents with  . Follow-up    breathing better, occasional cough    Referring provider: Burnis Medin, MD  HPI: 73 year old female, never smoked. PMH significant for OSA, OHS, bronchitis, allergic rhinitis, breast cancer. Patient of Dr. Annamaria Boots, recently seen by pulmonary NP on 12/13/18 for bronchitis symptoms treated with doxycycline. Resp viral panel negative. CXR showed chronic interstitial changes with bilateral costophrenic angle pleural thickening.  12/27/2018 Patient presents today for 2 week follow-up. She is feeling better since last visit. Completed doxycyline course. No significant cough or shortness of breath. Occasional nasal/chest congestion. She has a Chest CT scheduled on April 6th with [facility] to follow up on ? lung nodules. Needs refill nasal spray. States that he daugther recently returned from [place] and has cough. She has not had contact with her daughter and plans to continue practicing social distancing over the next few weeks. No fever, no travel.   Images: 12/13/18 CXR- Stable cardiomegaly and chronic interstitial changes  01/13/17 CXR- Increased interstitial markings bilaterally may reflect acute bronchitis or early pneumonitis.   Allergies  Allergen Reactions  . Ambien [Zolpidem Tartrate]     Felt bad hard to function  . Amoxicillin     REACTION: unspecified  . Levaquin [Levofloxacin] Other (See Comments)    Myalgias  . Lipitor [Atorvastatin Calcium]     Leg cramps  . Penicillins     As child  . Prednisone Itching  . Latex Rash  . Mupirocin Rash  . Other Rash    use PAPER tape    Immunization History  Administered Date(s) Administered  . Influenza, High Dose Seasonal PF 07/02/2015, 07/24/2016, 08/04/2017, 08/26/2018  . Influenza,inj,Quad PF,6+ Mos 06/23/2013  . Pneumococcal Conjugate-13 11/24/2013  . Td 10/03/2010    Past Medical  History:  Diagnosis Date  . Allergy   . Aphthous ulcer of mouth 2010   related to chemotherapy   . Breast cancer ([place])   . Carpal tunnel syndrome of right wrist   . Colitis   . Colon polyp   . Diabetes mellitus   . Diverticulosis   . GERD (gastroesophageal reflux disease)   . Hyperlipidemia   . Hypertension   . Lymphedema 08-31-13   left arm  . Osteoarthritis   . Pneumonia 07/28/2011  . Sinusitis   . Sleep apnea    setting of 10  . Squamous cell carcinoma    left leg    Tobacco History: Social History   Tobacco Use  Smoking Status Never Smoker  Smokeless Tobacco Never Used   Counseling given: Not Answered   Outpatient Medications Prior to Visit  Medication Sig Dispense Refill  . ALPRAZolam (XANAX) 0.25 MG tablet Take 1 tablet (0.25 mg total) by mouth at bedtime as needed for anxiety. 30 tablet 0  . anastrozole (ARIMIDEX) 1 MG tablet Take 1 tablet (1 mg total) by mouth daily. 90 tablet 5  . denosumab (PROLIA) 60 MG/ML SOLN injection Inject 60 mg into the skin every 6 (six) months. Administer in upper arm, thigh, or abdomen    . esomeprazole (NEXIUM) 40 MG capsule Take 40 mg by mouth as needed. Takes bedtime    . losartan (COZAAR) 50 MG tablet TAKE 1 TABLET BY MOUTH ONCE DAILY 90 tablet 1  . metFORMIN (GLUCOPHAGE-XR) 500 MG 24 hr tablet TAKE 2 TABLETS BY MOUTH TWICE A DAY  WITH MEALS 360 tablet 0  . methocarbamol (ROBAXIN) 500 MG tablet Take 1 tablet (500 mg total) by mouth every 8 (eight) hours as needed for muscle spasms. 30 tablet 0  . rosuvastatin (CRESTOR) 10 MG tablet TAKE 1 TABLET BY MOUTH ONCE DAILY 90 tablet 0  . sertraline (ZOLOFT) 25 MG tablet TAKE 1 TABLET BY MOUTH ONCE DAILY 90 tablet 0  . traZODone (DESYREL) 50 MG tablet TAKE 1/2 TO 1 TABLET BY MOUTH AT BEDTIME IF NEEDED FOR SLEEP 30 tablet 6  . azelastine (ASTELIN) 0.1 % nasal spray INSTILL 1 TO 2 SPRAYS INTO EACH NOSTRIL TWICE DAILY AS NEEDED 30 mL 5  . doxycycline (VIBRA-TABS) 100 MG tablet Take 1 tablet  (100 mg total) by mouth 2 (two) times daily. 14 tablet 0   No facility-administered medications prior to visit.     Review of Systems  Review of Systems  Constitutional: Negative.   Respiratory: Positive for cough. Negative for shortness of breath and wheezing.   Cardiovascular: Negative.    Physical Exam  BP 124/68 (BP Location: Right Arm, Cuff Size: Normal)   Pulse 64   Temp 98.4 F (36.9 C)   Ht 5\' 3"  (1.6 m)   Wt 186 lb 12.8 oz (84.7 kg)   SpO2 100%   BMI 33.09 kg/m  Physical Exam Constitutional:      Appearance: She is well-developed.  HENT:     Head: Normocephalic and atraumatic.  Eyes:     Pupils: Pupils are equal, round, and reactive to light.  Neck:     Musculoskeletal: Normal range of motion and neck supple.  Cardiovascular:     Rate and Rhythm: Normal rate and regular rhythm.     Heart sounds: Normal heart sounds. No murmur.  Pulmonary:     Effort: Pulmonary effort is normal. No respiratory distress.     Breath sounds: Normal breath sounds. No wheezing.  Skin:    General: Skin is warm and dry.     Findings: No erythema or rash.  Neurological:     Mental Status: She is alert and oriented to person, place, and time.  Psychiatric:        Behavior: Behavior normal.        Judgment: Judgment normal.      Lab Results:  CBC    Component Value Date/Time   WBC 7.5 08/07/2017 0854   RBC 4.31 08/07/2017 0854   HGB 13.4 08/07/2017 0854   HGB 12.9 03/30/2013 1020   HCT 39.4 08/07/2017 0854   HCT 37.4 03/30/2013 1020   PLT 216.0 08/07/2017 0854   PLT 206 03/30/2013 1020   MCV 91.4 08/07/2017 0854   MCV 83.9 03/30/2013 1020   MCH 30.5 09/14/2013 0457   MCHC 34.1 08/07/2017 0854   RDW 13.3 08/07/2017 0854   RDW 15.3 (H) 03/30/2013 1020   LYMPHSABS 1.7 08/07/2017 0854   LYMPHSABS 1.6 03/30/2013 1020   MONOABS 0.5 08/07/2017 0854   MONOABS 0.3 03/30/2013 1020   EOSABS 0.2 08/07/2017 0854   EOSABS 0.1 03/30/2013 1020   BASOSABS 0.0 08/07/2017 0854    BASOSABS 0.0 03/30/2013 1020    BMET    Component Value Date/Time   NA 140 09/13/2018 1012   NA 141 03/30/2013 1020   K 4.6 09/13/2018 1012   K 3.9 03/30/2013 1020   CL 103 09/13/2018 1012   CL 105 03/30/2013 1020   CO2 29 09/13/2018 1012   CO2 25 03/30/2013 1020   GLUCOSE 172 (H) 09/13/2018  1012   GLUCOSE 180 (H) 03/30/2013 1020   BUN 13 09/13/2018 1012   BUN 10.3 03/30/2013 1020   CREATININE 0.70 09/13/2018 1012   CREATININE 0.7 03/30/2013 1020   CALCIUM 10.2 09/13/2018 1012   CALCIUM 10.0 03/30/2013 1020   GFRNONAA >90 09/14/2013 0457   GFRAA >90 09/14/2013 0457    BNP No results found for: BNP  ProBNP No results found for: PROBNP  Imaging: Dg Chest 2 View  Result Date: 12/13/2018 CLINICAL DATA:  Cough and fever. EXAM: CHEST - 2 VIEW COMPARISON:  Chest radiograph January 12, 2017 FINDINGS: Cardiac silhouette is upper limits of normal in size. Mildly calcified aortic arch. Chronic interstitial changes with bilateral costophrenic angle pleural thickening. No pleural effusion. No pneumothorax. Surgical clips LEFT axilla. IMPRESSION: 1. Stable cardiomegaly and chronic interstitial changes. 2.  Aortic Atherosclerosis (ICD10-I70.0). Electronically Signed   By: Elon Alas M.D.   On: 12/13/2018 16:06     Assessment & Plan:   Bronchitis - Resolved after Doxycycline course - CXR 12/13/18 showed stable cardiomegaly and chronic interstitial changes   Allergic rhinitis - Advised Zyrtec and Astelin during allergy season  Abnormal chest CT - CT abdomen 10/15/18 ([facility])- showed radiation changes within the left upper lobe. Similar reticulation within the right inferior middle lobe. Interval development of groundglass opacity within the inferior right upper lobe measuring 7 mm. New 5 mm inferior right upper lobe solid pulmonary nodule. Similar 6 mm right apical ground glass opacity and 6 mm left lower lobe solid nodule. Nodularity along the left lower lobe posterior pleura is  similar to prior. No pleural effusions. No pneumothorax.  - Due for follow up CT in April, advised visit with Dr. Annamaria Boots after to review further   Martyn Ehrich, NP 12/27/2018

## 2018-12-27 NOTE — Assessment & Plan Note (Signed)
-   Advised Zyrtec and Astelin during allergy season

## 2018-12-27 NOTE — Assessment & Plan Note (Signed)
-   CT abdomen 10/15/18 ([facility])- showed radiation changes within the left upper lobe. Similar reticulation within the right inferior middle lobe. Interval development of groundglass opacity within the inferior right upper lobe measuring 7 mm. New 5 mm inferior right upper lobe solid pulmonary nodule. Similar 6 mm right apical ground glass opacity and 6 mm left lower lobe solid nodule. Nodularity along the left lower lobe posterior pleura is similar to prior. No pleural effusions. No pneumothorax.  - Due for follow up CT in April, advised visit with Dr. Annamaria Boots after to review further

## 2018-12-27 NOTE — Assessment & Plan Note (Deleted)
-   CT abdomen 10/15/18 ([facility])- showed radiation changes within the left upper lobe. Similar reticulation within the right inferior middle lobe. Interval development of groundglass opacity within the inferior right upper lobe measuring 7 mm. New 5 mm inferior right upper lobe solid pulmonary nodule. Similar 6 mm right apical ground glass opacity and 6 mm left lower lobe solid nodule. Nodularity along the left lower lobe posterior pleura is similar to prior. No pleural effusions. No pneumothorax.  - Due for follow up CT in April, advised visit with Dr. Annamaria Boots after to review further

## 2019-01-02 ENCOUNTER — Other Ambulatory Visit: Payer: Self-pay | Admitting: Internal Medicine

## 2019-01-06 DIAGNOSIS — G4733 Obstructive sleep apnea (adult) (pediatric): Secondary | ICD-10-CM | POA: Diagnosis not present

## 2019-02-02 ENCOUNTER — Other Ambulatory Visit: Payer: Self-pay | Admitting: Internal Medicine

## 2019-02-14 DIAGNOSIS — R918 Other nonspecific abnormal finding of lung field: Secondary | ICD-10-CM | POA: Diagnosis not present

## 2019-02-14 DIAGNOSIS — R933 Abnormal findings on diagnostic imaging of other parts of digestive tract: Secondary | ICD-10-CM | POA: Diagnosis not present

## 2019-02-14 DIAGNOSIS — C50912 Malignant neoplasm of unspecified site of left female breast: Secondary | ICD-10-CM | POA: Diagnosis not present

## 2019-02-14 DIAGNOSIS — R9389 Abnormal findings on diagnostic imaging of other specified body structures: Secondary | ICD-10-CM | POA: Diagnosis not present

## 2019-02-22 DIAGNOSIS — L821 Other seborrheic keratosis: Secondary | ICD-10-CM | POA: Diagnosis not present

## 2019-02-22 DIAGNOSIS — D2271 Melanocytic nevi of right lower limb, including hip: Secondary | ICD-10-CM | POA: Diagnosis not present

## 2019-02-22 DIAGNOSIS — Z85828 Personal history of other malignant neoplasm of skin: Secondary | ICD-10-CM | POA: Diagnosis not present

## 2019-02-22 DIAGNOSIS — L820 Inflamed seborrheic keratosis: Secondary | ICD-10-CM | POA: Diagnosis not present

## 2019-02-22 DIAGNOSIS — D1801 Hemangioma of skin and subcutaneous tissue: Secondary | ICD-10-CM | POA: Diagnosis not present

## 2019-03-08 NOTE — Progress Notes (Signed)
Virtual Visit via Video Note  I connected with@ on 03/09/19 at  9:45 AM EDT by a video enabled telemedicine application and verified that I am speaking with the correct person using two identifiers. Location patient: home Location provider:work  office Persons participating in the virtual visit: patient, provider  [place] national recommendations  regarding COVID 19 pandemic   video visit is advised over in office visit for this patient.  Patient aware  of the limitations of evaluation and management by telemedicine and  availability of in person appointments. and agreed to proceed.   HPI: Chelsey [name] presents for video visit fro 6 mos Chronic disease management  Due for 6 mos med check    DM hasn't checked   BG  no change med  RESP   See past traveled to [place] [employer] and had cruise in [place] and got sick rx antibiotic now better but has pulm nodules followed   Wonder if she could have had covid  Her daughter had a  Pos cov2 testing   but  Not exposure  From her travel  And she is better   ( curious negative antibodies )   Allergies no chznge    Hx of breast cancer   Updated her colon oscopu and to have fu   BP ok but not checking  But ok    Sleep off some with covid change but doing ok now   Would like to have xanax on hand in case  Has 9 left  Stress  With work from home but doing better now  ROS: See pertinent positives and negatives per HPI.   Past Medical History:  Diagnosis Date  . Allergy   . Aphthous ulcer of mouth 2010   related to chemotherapy   . Breast cancer ([place])   . Carpal tunnel syndrome of right wrist   . Colitis   . Colon polyp   . Diabetes mellitus   . Diverticulosis   . GERD (gastroesophageal reflux disease)   . Hyperlipidemia   . Hypertension   . Lymphedema 08-31-13   left arm  . Osteoarthritis   . Pneumonia 07/28/2011  . Sinusitis   . Sleep apnea    setting of 10  . Squamous cell carcinoma    left leg    Past Surgical History:   Procedure Laterality Date  . AUGMENTATION MAMMAPLASTY Left   . CESAREAN SECTION     x 3  . MASTECTOMY Left 11/23   2010  . RECONSTRUCTION BREAST IMMEDIATE / DELAYED W/ TISSUE EXPANDER Left   . REDUCTION MAMMAPLASTY Right   . SKIN GRAFT    . SQUAMOUS CELL CARCINOMA EXCISION Left    Left lower extremity  . TONSILLECTOMY     as child  . TOTAL KNEE ARTHROPLASTY Left 12/20/2012   Procedure: TOTAL KNEE ARTHROPLASTY;  Surgeon: Gearlean Alf, MD;  Location: [facility];  Service: Orthopedics;  Laterality: Left;  . TOTAL KNEE ARTHROPLASTY Right 09/12/2013   Procedure: RIGHT TOTAL KNEE ARTHROPLASTY;  Surgeon: Gearlean Alf, MD;  Location: [facility];  Service: Orthopedics;  Laterality: Right;  . TUBAL LIGATION      Family History  Problem Relation Age of Onset  . Alzheimer's disease Mother   . Sleep apnea Mother   . Diabetes Mother   . Hypertension Mother   . Hyperlipidemia Mother   . Breast cancer Mother 49       triple negative  . Anxiety disorder Daughter   . Hypertension Son  pulmonary  . Congenital heart disease Son 0       TGA  died age 44     Social History   Tobacco Use  . Smoking status: Never Smoker  . Smokeless tobacco: Never Used  Substance Use Topics  . Alcohol use: Yes    Alcohol/week: 6.0 standard drinks    Types: 6 Glasses of wine per week    Comment: 1-2 glasses every other night  . Drug use: No      Current Outpatient Medications:  .  ALPRAZolam (XANAX) 0.25 MG tablet, Take 1 tablet (0.25 mg total) by mouth 2 (two) times daily as needed for anxiety. Avoid regular use., Disp: 30 tablet, Rfl: 0 .  anastrozole (ARIMIDEX) 1 MG tablet, Take 1 tablet (1 mg total) by mouth daily., Disp: 90 tablet, Rfl: 5 .  azelastine (ASTELIN) 0.1 % nasal spray, INSTILL 1 TO 2 SPRAYS INTO EACH NOSTRIL TWICE DAILY AS NEEDED, Disp: 30 mL, Rfl: 5 .  denosumab (PROLIA) 60 MG/ML SOLN injection, Inject 60 mg into the skin every 6 (six) months. Administer in upper arm, thigh, or  abdomen, Disp: , Rfl:  .  esomeprazole (NEXIUM) 40 MG capsule, Take 40 mg by mouth as needed. Takes bedtime, Disp: , Rfl:  .  losartan (COZAAR) 50 MG tablet, TAKE 1 TABLET BY MOUTH ONCE DAILY, Disp: 90 tablet, Rfl: 1 .  metFORMIN (GLUCOPHAGE-XR) 500 MG 24 hr tablet, TAKE 2 TABLETS BY MOUTH TWICE DAILY WITH MEALS, Disp: 360 tablet, Rfl: 0 .  methocarbamol (ROBAXIN) 500 MG tablet, Take 1 tablet (500 mg total) by mouth every 8 (eight) hours as needed for muscle spasms., Disp: 30 tablet, Rfl: 0 .  rosuvastatin (CRESTOR) 10 MG tablet, TAKE 1 TABLET BY MOUTH EVERY DAY, Disp: 90 tablet, Rfl: 0 .  sertraline (ZOLOFT) 25 MG tablet, TAKE 1 TABLET BY MOUTH EVERY DAY, Disp: 90 tablet, Rfl: 0 .  traZODone (DESYREL) 50 MG tablet, TAKE 1/2 TO 1 TABLET BY MOUTH AT BEDTIME IF NEEDED FOR SLEEP, Disp: 30 tablet, Rfl: 6  EXAM: BP Readings from Last 3 Encounters:  12/27/18 124/68  12/13/18 120/66  12/02/18 126/70    VITALS per patient if applicable: looks well   GENERAL: alert, oriented, appears well and in no acute distress  HEENT: atraumatic, conjunttiva clear, no obvious abnormalities on inspection of external nose and ears  NECK: normal movements of the head and neck  LUNGS: on inspection no signs of respiratory distress, breathing rate appears normal, no obvious gross SOB, gasping or wheezing  CV: no obvious cyanosis  PSYCH/NEURO: pleasant and cooperative, no obvious depression or anxiety, speech and thought processing grossly intact Lab Results  Component Value Date   WBC 7.5 08/07/2017   HGB 13.4 08/07/2017   HCT 39.4 08/07/2017   PLT 216.0 08/07/2017   GLUCOSE 172 (H) 09/13/2018   CHOL 195 09/13/2018   TRIG 142.0 09/13/2018   HDL 64.90 09/13/2018   LDLDIRECT 173.9 11/09/2013   LDLCALC 102 (H) 09/13/2018   ALT 25 09/13/2018   AST 23 09/13/2018   NA 140 09/13/2018   K 4.6 09/13/2018   CL 103 09/13/2018   CREATININE 0.70 09/13/2018   BUN 13 09/13/2018   CO2 29 09/13/2018   TSH 2.24  08/07/2017   INR 0.93 08/31/2013   HGBA1C 7.2 (H) 09/13/2018   MICROALBUR <0.7 09/13/2018   BP Readings from Last 3 Encounters:  12/27/18 124/68  12/13/18 120/66  12/02/18 126/70   reviewed record with her  ASSESSMENT AND PLAN:  Discussed the following assessment and plan:  Type 2 diabetes mellitus without complication, without long-term current use of insulin (HCC) - Plan: POCT glycosylated hemoglobin (Hb A1C)  Medication management - see text  Essential hypertension  Hyperlipidemia, unspecified hyperlipidemia type  OSA on CPAP  HX: breast cancer Due for a1c  disc  Limitation of aby testing and would entail a venipuncture so will order poc a1c and disc other with her specialist  Who will  Get venipuncture  lab in July  Counseled.   Ok to refill alarpaz  For now in case needed  Adapting  No other change in meds   Expectant management and discussion of plan and treatment with opportunity to ask questions and all were answered. The patient agreed with the plan and demonstrated an understanding of the instructions.   Advised to call back or seek an in-person evaluation if worsening  or having  further concerns . If a1c is ok then plan cpx and labs in december time  Or about 6 months  Shanon Ace, MD

## 2019-03-09 ENCOUNTER — Ambulatory Visit (INDEPENDENT_AMBULATORY_CARE_PROVIDER_SITE_OTHER): Payer: Medicare Other | Admitting: Internal Medicine

## 2019-03-09 ENCOUNTER — Encounter: Payer: Self-pay | Admitting: Internal Medicine

## 2019-03-09 ENCOUNTER — Other Ambulatory Visit: Payer: Self-pay

## 2019-03-09 DIAGNOSIS — E119 Type 2 diabetes mellitus without complications: Secondary | ICD-10-CM | POA: Diagnosis not present

## 2019-03-09 DIAGNOSIS — G4733 Obstructive sleep apnea (adult) (pediatric): Secondary | ICD-10-CM

## 2019-03-09 DIAGNOSIS — Z853 Personal history of malignant neoplasm of breast: Secondary | ICD-10-CM

## 2019-03-09 DIAGNOSIS — Z79899 Other long term (current) drug therapy: Secondary | ICD-10-CM | POA: Diagnosis not present

## 2019-03-09 DIAGNOSIS — I10 Essential (primary) hypertension: Secondary | ICD-10-CM

## 2019-03-09 DIAGNOSIS — E785 Hyperlipidemia, unspecified: Secondary | ICD-10-CM

## 2019-03-09 DIAGNOSIS — Z9989 Dependence on other enabling machines and devices: Secondary | ICD-10-CM

## 2019-03-09 MED ORDER — ALPRAZOLAM 0.25 MG PO TABS: 0.2500 mg | ORAL_TABLET | Freq: Two times a day (BID) | ORAL | 0 refills | Status: DC | PRN

## 2019-03-29 ENCOUNTER — Other Ambulatory Visit: Payer: Self-pay | Admitting: Internal Medicine

## 2019-04-16 ENCOUNTER — Other Ambulatory Visit: Payer: Self-pay | Admitting: Internal Medicine

## 2019-04-22 DIAGNOSIS — R918 Other nonspecific abnormal finding of lung field: Secondary | ICD-10-CM | POA: Diagnosis not present

## 2019-04-22 DIAGNOSIS — R935 Abnormal findings on diagnostic imaging of other abdominal regions, including retroperitoneum: Secondary | ICD-10-CM | POA: Diagnosis not present

## 2019-04-22 DIAGNOSIS — N952 Postmenopausal atrophic vaginitis: Secondary | ICD-10-CM | POA: Diagnosis not present

## 2019-04-22 DIAGNOSIS — C50912 Malignant neoplasm of unspecified site of left female breast: Secondary | ICD-10-CM | POA: Diagnosis not present

## 2019-04-22 DIAGNOSIS — Z79899 Other long term (current) drug therapy: Secondary | ICD-10-CM | POA: Diagnosis not present

## 2019-04-22 DIAGNOSIS — M8588 Other specified disorders of bone density and structure, other site: Secondary | ICD-10-CM | POA: Diagnosis not present

## 2019-04-22 DIAGNOSIS — M810 Age-related osteoporosis without current pathological fracture: Secondary | ICD-10-CM | POA: Diagnosis not present

## 2019-04-22 DIAGNOSIS — Z170 Estrogen receptor positive status [ER+]: Secondary | ICD-10-CM | POA: Diagnosis not present

## 2019-05-03 DIAGNOSIS — G4733 Obstructive sleep apnea (adult) (pediatric): Secondary | ICD-10-CM | POA: Diagnosis not present

## 2019-06-27 ENCOUNTER — Other Ambulatory Visit: Payer: Self-pay | Admitting: Internal Medicine

## 2019-06-28 ENCOUNTER — Ambulatory Visit: Payer: Medicare Other | Attending: Internal Medicine | Admitting: Physical Therapy

## 2019-06-28 ENCOUNTER — Other Ambulatory Visit: Payer: Self-pay

## 2019-06-28 DIAGNOSIS — R293 Abnormal posture: Secondary | ICD-10-CM | POA: Diagnosis not present

## 2019-06-28 DIAGNOSIS — I972 Postmastectomy lymphedema syndrome: Secondary | ICD-10-CM | POA: Diagnosis not present

## 2019-06-28 DIAGNOSIS — M6281 Muscle weakness (generalized): Secondary | ICD-10-CM | POA: Diagnosis not present

## 2019-06-28 NOTE — Therapy (Signed)
[place], [place], [address] Phone: [phone]   Fax:  [phone]  Physical Therapy Evaluation  Patient Details  Name: Chelsey [name] MRN: [id number] Date of Birth: Feb 15, 1946 Referring Provider (PT): Dr. Harvie Heck   Encounter Date: 06/28/2019  PT End of Session - 06/28/19 1631    Visit Number  1    Number of Visits  17    Date for PT Re-Evaluation  08/26/19    PT Start Time  1110    PT Stop Time  1155    PT Time Calculation (min)  45 min    Activity Tolerance  Patient tolerated treatment well    Behavior During Therapy  [facility] for tasks assessed/performed       Past Medical History:  Diagnosis Date  . Allergy   . Aphthous ulcer of mouth 2010   related to chemotherapy   . Breast cancer ([place])   . Carpal tunnel syndrome of right wrist   . Colitis   . Colon polyp   . Diabetes mellitus   . Diverticulosis   . GERD (gastroesophageal reflux disease)   . Hyperlipidemia   . Hypertension   . Lymphedema 08-31-13   left arm  . Osteoarthritis   . Pneumonia 07/28/2011  . Sinusitis   . Sleep apnea    setting of 10  . Squamous cell carcinoma    left leg    Past Surgical History:  Procedure Laterality Date  . AUGMENTATION MAMMAPLASTY Left   . CESAREAN SECTION     x 3  . MASTECTOMY Left 11/23   2010  . RECONSTRUCTION BREAST IMMEDIATE / DELAYED W/ TISSUE EXPANDER Left   . REDUCTION MAMMAPLASTY Right   . SKIN GRAFT    . SQUAMOUS CELL CARCINOMA EXCISION Left    Left lower extremity  . TONSILLECTOMY     as child  . TOTAL KNEE ARTHROPLASTY Left 12/20/2012   Procedure: TOTAL KNEE ARTHROPLASTY;  Surgeon: Gearlean Alf, MD;  Location: [facility];  Service: Orthopedics;  Laterality: Left;  . TOTAL KNEE ARTHROPLASTY Right 09/12/2013   Procedure: RIGHT TOTAL KNEE ARTHROPLASTY;  Surgeon: Gearlean Alf, MD;  Location: [facility];  Service: Orthopedics;  Laterality: Right;  . TUBAL LIGATION      There were  no vitals filed for this visit.   Subjective Assessment - 06/28/19 1131    Subjective  Has been treated for lymphedema in the past and she has a day and nighttime sleeve but the lymphedema has progressed into her hand.  She says it has gotten worse since April  after doing alot of yard work. She feels like her swelling has gone down into her back.  She has a hard time working with the bandages and wants to find out about a pump She brings in a Comfort Plus nighttime sleeve and has been wearing it the past week    Pertinent History  left breast cancer with mastectomy 09/04/2009 had 2 nodes removed and then had to have a second surgery for 8 or 9 more removed.  Prior to surgery , she had chemotherapy , after surgery radiation, Then had reconstruction with a latt flap.  She still has tightness in her shoulder. Past history includes DM and bilateral knee replacement    Patient Stated Goals  She wants to get her arm back under control         [facility] PT Assessment - 06/28/19 0001      Assessment  Medical Diagnosis  left breast cancer     Referring Provider (PT)  Dr. Harvie Heck    Onset Date/Surgical Date  09/04/09    Hand Dominance  Right      Precautions   Precautions  Other (comment)    Precaution Comments  at risk for lymphedema       Restrictions   Weight Bearing Restrictions  No      Balance Screen   Has the patient fallen in the past 6 months  No    Has the patient had a decrease in activity level because of a fear of falling?   No    Is the patient reluctant to leave their home because of a fear of falling?   No      Home Film/video editor residence    Living Arrangements  Spouse/significant other    Available Help at Discharge  Available PRN/intermittently      Prior Function   Level of Independence  Independent    Vocation  Full time employment    Vocation Requirements  walking up and down steps, flexible schedule     Leisure  does not exercise on a  regular basis       Cognition   Overall Cognitive Status  Within Functional Limits for tasks assessed      Observation/Other Assessments   Observations  pt has well healed incision on back from lat flap procedure and at anterior chest.  She has asymmetical fullness around left back lateral chest and shoulder with visible lymphostatic lymphedema in left arm, forearm and hand     Skin Integrity  no open areas     Other Surveys   Quick Dash    Quick DASH   34.09      Sensation   Light Touch  Not tested      Coordination   Gross Motor Movements are Fluid and Coordinated  Yes      Posture/Postural Control   Posture/Postural Control  Postural limitations    Postural Limitations  Rounded Shoulders;Forward head;Increased thoracic kyphosis      ROM / Strength   AROM / PROM / Strength  AROM      AROM   Overall AROM   Within functional limits for tasks performed    Overall AROM Comments  pt reports she has no problems with stiffness in her shoulder     AROM Assessment Site  Shoulder    Right/Left Shoulder  Right;Left    Right Shoulder Flexion  150 Degrees    Right Shoulder ABduction  150 Degrees    Left Shoulder Flexion  150 Degrees    Left Shoulder ABduction  150 Degrees        LYMPHEDEMA/ONCOLOGY QUESTIONNAIRE - 06/28/19 1136      Type   Cancer Type  left breast       Surgeries   Mastectomy Date  09/04/09    Number Lymph Nodes Removed  10      Treatment   Past Chemotherapy Treatment  No    Past Radiation Treatment  No    Current Hormone Treatment  Yes    Drug Name  Arimedex      What other symptoms do you have   Are you Having Heaviness or Tightness  Yes    Are you having Pain  Yes    Are you having pitting edema  Yes    Body Site  forearm    Is it  Hard or Difficult finding clothes that fit  Yes    Do you have infections  No    Is there Decreased scar mobility  Yes    Stemmer Sign  No      Lymphedema Stage   Stage  STAGE 2 SPONTANEOUSLY IRREVERSIBLE      Right  Upper Extremity Lymphedema   10 cm Proximal to Olecranon Process  34.5 cm    Olecranon Process  28.5 cm    15 cm Proximal to Ulnar Styloid Process  29.5 cm    10 cm Proximal to Ulnar Styloid Process  26.5 cm    Just Proximal to Ulnar Styloid Process  18 cm    Across Hand at [employer]  20.5 cm    At [place] of 2nd Digit  7 cm      Left Upper Extremity Lymphedema   10 cm Proximal to Olecranon Process  37 cm    Olecranon Process  33 cm    15 cm Proximal to Ulnar Styloid Process  34.5 cm    10 cm Proximal to Ulnar Styloid Process  30.5 cm    Just Proximal to Ulnar Styloid Process  21 cm    Across Hand at [employer]  20.5 cm    At [place] of 2nd Digit  7 cm          Quick Dash - 06/28/19 0001    Open a tight or new jar  No difficulty    Do heavy household chores (wash walls, wash floors)  Mild difficulty    Carry a shopping bag or briefcase  Moderate difficulty    Wash your back  Moderate difficulty    Use a knife to cut food  No difficulty    Recreational activities in which you take some force or impact through your arm, shoulder, or hand (golf, hammering, tennis)  Moderate difficulty    During the past week, to what extent has your arm, shoulder or hand problem interfered with your normal social activities with family, friends, neighbors, or groups?  Modererately    During the past week, to what extent has your arm, shoulder or hand problem limited your work or other regular daily activities  Modererately    Arm, shoulder, or hand pain.  Moderate    Tingling (pins and needles) in your arm, shoulder, or hand  Mild    Difficulty Sleeping  Mild difficulty    DASH Score  34.09 %        Objective measurements completed on examination: See above findings.      [facility] Adult PT Treatment/Exercise - 06/28/19 0001      Self-Care   Self-Care  Other Self-Care Comments    Other Self-Care Comments   pt educated on the importance of elevation of her hand higher than her elbow  higher than her shoulder and heart and to hand , wrist, forearm and elbow, ROM exercises . She will contine to wear her nighttime garment every night.                PT Short Term Goals - 06/28/19 1638      PT SHORT TERM GOAL #1   Title  Pt will be independent in self MLD , elevation and remedial exercise    Time  4    Period  Weeks    Status  New      PT SHORT TERM GOAL #2   Title  Pt will have reduction  of left arm circumference at 10 cm proximal to ulnar styolid by 1 cm    Baseline  30.5 on 9/15    Time  4    Period  Weeks    Status  New        PT Long Term Goals - 06/28/19 1639      PT LONG TERM GOAL #1   Title  Pt will be able to manage her lymphedema at home with use of elevation exercise, compression garments and compression pump    Time  8    Period  Weeks    Status  New      PT LONG TERM GOAL #2   Title  Pt will be independent in a HEP for shoulder strength    Time  8    Period  Weeks    Status  New      PT LONG TERM GOAL #3   Title  Pt will have decrease in left UE at 10 cm proximal to ulnar styloid by 2 cm    Baseline  30.5 on 9/15    Time  8    Period  Weeks    Status  New             Plan - 06/28/19 1632    Clinical Impression Statement  Vaneisha Horky comes to PT with exacerbation of her longstanding lymphedema. She has been under control with home care and use of nighttime [employer], but has had increase in symptoms with progress to fullness in back over the last few months.  Because of her job duties, she is not able to do compression bandaging at this time, but will benefit from manual lymph drainage, elevation,  exercise and use of nighttime garment.  She needs to add a compression pump to her home managment.    Personal Factors and Comorbidities  Comorbidity 3+    Comorbidities  mastectomy with ~ 10 nodes removed ,radiation, DM    Stability/Clinical Decision Making  Stable/Uncomplicated    Clinical Decision Making  Low    Rehab  Potential  Good    PT Frequency  2x / week    PT Duration  8 weeks    PT Treatment/Interventions  ADLs/Self Care Home Management;DME Instruction;Therapeutic activities;Therapeutic exercise;Orthotic Fit/Training;Manual techniques;Manual lymph drainage;Passive range of motion;Scar mobilization;Taping;Compression bandaging    PT Next Visit Plan  Manual lymph drainage and teach self  MLD, Remedial exercise and progress to shoulder strengthening, help faciliate compression pump    Recommended Other Services  demographics sent for pump    Consulted and Agree with Plan of Care  Patient       Patient will benefit from skilled therapeutic intervention in order to improve the following deficits and impairments:  Decreased scar mobility, Increased fascial restricitons, Impaired UE functional use, Increased edema, Obesity, Postural dysfunction  Visit Diagnosis: Postmastectomy lymphedema - Plan: PT plan of care cert/re-cert  Abnormal posture - Plan: PT plan of care cert/re-cert  Muscle weakness (generalized) - Plan: PT plan of care cert/re-cert     Problem List Patient Active Problem List   Diagnosis Date Noted  . Abnormal chest CT 12/27/2018  . Obesity hypoventilation syndrome ([place]) 10/26/2018  . Seasonal allergic conjunctivitis 06/17/2017  . Medicare annual wellness visit, initial 06/08/2014  . Anemia 11/24/2013  . Breast cancer of upper-outer quadrant of left female breast ([place]) 04/07/2013  . S/P TKR (total knee replacement) 03/21/2013  . Sleep disturbance, unspecified 03/21/2013  . Abnormal LFTs 03/21/2013  .  OA (osteoarthritis) of knee 12/20/2012  . Degenerative joint disease of knee 11/05/2012  . Diabetes mellitus, type 2 ([place]) 11/05/2012  . Adjustment disorder 03/13/2012  . Neuritis 07/28/2011  . Obstructive sleep apnea 05/28/2009  . Malignant neoplasm of female breast ([place]) 04/07/2009  . RHINITIS, CHRONIC 02/21/2009  . HYPERLIPIDEMIA 02/15/2007  . Essential hypertension  02/15/2007  . Allergic rhinitis 02/15/2007  . GERD 02/15/2007  . OSTEOARTHRITIS 02/15/2007  . Impaired fasting glucose 02/15/2007   Donato Heinz. Owens Shark PT  Norwood Levo 06/28/2019, 4:44 PM  [place], [place], [address] Phone: [phone]   Fax:  [phone]  Name: LONI KOLIN MRN: [id number] Date of Birth: [birth year]

## 2019-06-29 ENCOUNTER — Encounter: Payer: Self-pay | Admitting: Physical Therapy

## 2019-06-29 ENCOUNTER — Ambulatory Visit: Payer: Medicare Other | Admitting: Physical Therapy

## 2019-06-29 DIAGNOSIS — R293 Abnormal posture: Secondary | ICD-10-CM

## 2019-06-29 DIAGNOSIS — M6281 Muscle weakness (generalized): Secondary | ICD-10-CM

## 2019-06-29 DIAGNOSIS — I972 Postmastectomy lymphedema syndrome: Secondary | ICD-10-CM | POA: Diagnosis not present

## 2019-06-29 NOTE — Therapy (Signed)
[place], [place], [address] Phone: [phone]   Fax:  [phone]  Physical Therapy Treatment  Patient Details  Name: Chelsey [name] MRN: [id number] Date of Birth: 06-Mar-1946 Referring Provider (PT): Dr. Harvie Heck   Encounter Date: 06/29/2019  PT End of Session - 06/29/19 1622    Visit Number  2    Number of Visits  17    Date for PT Re-Evaluation  08/26/19    PT Start Time  [id number]    PT Stop Time  1615    PT Time Calculation (min)  45 min    Activity Tolerance  Patient tolerated treatment well    Behavior During Therapy  [facility] for tasks assessed/performed       Past Medical History:  Diagnosis Date  . Allergy   . Aphthous ulcer of mouth 2010   related to chemotherapy   . Breast cancer ([place])   . Carpal tunnel syndrome of right wrist   . Colitis   . Colon polyp   . Diabetes mellitus   . Diverticulosis   . GERD (gastroesophageal reflux disease)   . Hyperlipidemia   . Hypertension   . Lymphedema 08-31-13   left arm  . Osteoarthritis   . Pneumonia 07/28/2011  . Sinusitis   . Sleep apnea    setting of 10  . Squamous cell carcinoma    left leg    Past Surgical History:  Procedure Laterality Date  . AUGMENTATION MAMMAPLASTY Left   . CESAREAN SECTION     x 3  . MASTECTOMY Left 11/23   2010  . RECONSTRUCTION BREAST IMMEDIATE / DELAYED W/ TISSUE EXPANDER Left   . REDUCTION MAMMAPLASTY Right   . SKIN GRAFT    . SQUAMOUS CELL CARCINOMA EXCISION Left    Left lower extremity  . TONSILLECTOMY     as child  . TOTAL KNEE ARTHROPLASTY Left 12/20/2012   Procedure: TOTAL KNEE ARTHROPLASTY;  Surgeon: Gearlean Alf, MD;  Location: [facility];  Service: Orthopedics;  Laterality: Left;  . TOTAL KNEE ARTHROPLASTY Right 09/12/2013   Procedure: RIGHT TOTAL KNEE ARTHROPLASTY;  Surgeon: Gearlean Alf, MD;  [facility];  Service: Orthopedics;  Laterality: Right;  . TUBAL LIGATION      There were no  vitals filed for this visit.  Subjective Assessment - 06/29/19 1532    Subjective  Pt states that she has been working at her computer all day with arm bent up so it "acts up"    Pertinent History  left breast cancer with mastectomy 09/04/2009 had 2 nodes removed and then had to have a second surgery for 8 or 9 more removed.  Prior to surgery , she had chemotherapy , after surgery radiation, Then had reconstruction with a latt flap.  She still has tightness in her shoulder. Past history includes DM and bilateral knee replacement    Currently in Pain?  Yes    Pain Score  --   did not rate   Pain Orientation  Left    Pain Descriptors / Indicators  Aching    Pain Type  Chronic pain    Pain Onset  1 to 4 weeks ago                       [facility] Adult PT Treatment/Exercise - 06/29/19 0001      Manual Therapy   Manual Therapy  Edema management;Manual Lymphatic Drainage (MLD)  Edema Management  gave pt link for [email or web address] self MLD video     Manual Lymphatic Drainage (MLD)  in supine, with head of bed elevated, short neck, superficial and deep abdominals with diaphragmatic breaths, right axillary nodes anterior interaxillary anastamosis and chest,  left inguinal nodes and left axillo-inguinal anastamosis, left shoulder arm, forearm and hand with elevation and exercisre and return along pathways with extra time spent on firm areas of forearm and at medial elbow, then to right sidelying for posterior interaxillary anastamosis and back with ROM of left arm abduction, small circles while doing MLD                PT Short Term Goals - 06/28/19 1638      PT SHORT TERM GOAL #1   Title  Pt will be independent in self MLD , elevation and remedial exercise    Time  4    Period  Weeks    Status  New      PT SHORT TERM GOAL #2   Title  Pt will have reduction of left arm circumference at 10 cm proximal to ulnar styolid by 1 cm    Baseline  30.5 on 9/15    Time  4    Period   Weeks    Status  New        PT Long Term Goals - 06/28/19 1639      PT LONG TERM GOAL #1   Title  Pt will be able to manage her lymphedema at home with use of elevation exercise, compression garments and compression pump    Time  8    Period  Weeks    Status  New      PT LONG TERM GOAL #2   Title  Pt will be independent in a HEP for shoulder strength    Time  8    Period  Weeks    Status  New      PT LONG TERM GOAL #3   Title  Pt will have decrease in left UE at 10 cm proximal to ulnar styloid by 2 cm    Baseline  30.5 on 9/15    Time  8    Period  Weeks    Status  New            Plan - 06/29/19 1623    Clinical Impression Statement  First session of MLD and pt states that her arm is not aching when she left and it feels a softer though it is still larger. Skin of hand much looser and moveabe after session.  Pt remembers somewhat how to do self MLD, but reviewed stroke and sequence and gave pt link for self care video    Comorbidities  mastectomy with ~ 10 nodes removed ,radiation, DM    PT Treatment/Interventions  ADLs/Self Care Home Management;DME Instruction;Therapeutic activities;Therapeutic exercise;Orthotic Fit/Training;Manual techniques;Manual lymph drainage;Passive range of motion;Scar mobilization;Taping;Compression bandaging    PT Next Visit Plan  cont Manual lymph drainage and review  self  MLD, Remedial exercise and progress to shoulder strengthening, help faciliate compression pump    Consulted and Agree with Plan of Care  Patient       Patient will benefit from skilled therapeutic intervention in order to improve the following deficits and impairments:  Decreased scar mobility, Increased fascial restricitons, Impaired UE functional use, Increased edema, Obesity, Postural dysfunction  Visit Diagnosis: Postmastectomy lymphedema  Abnormal posture  Muscle weakness (generalized)     Problem  List Patient Active Problem List   Diagnosis Date Noted  .  Abnormal chest CT 12/27/2018  . Obesity hypoventilation syndrome ([place]) 10/26/2018  . Seasonal allergic conjunctivitis 06/17/2017  . Medicare annual wellness visit, initial 06/08/2014  . Anemia 11/24/2013  . Breast cancer of upper-outer quadrant of left female breast ([place]) 04/07/2013  . S/P TKR (total knee replacement) 03/21/2013  . Sleep disturbance, unspecified 03/21/2013  . Abnormal LFTs 03/21/2013  . OA (osteoarthritis) of knee 12/20/2012  . Degenerative joint disease of knee 11/05/2012  . Diabetes mellitus, type 2 ([place]) 11/05/2012  . Adjustment disorder 03/13/2012  . Neuritis 07/28/2011  . Obstructive sleep apnea 05/28/2009  . Malignant neoplasm of female breast ([place]) 04/07/2009  . RHINITIS, CHRONIC 02/21/2009  . HYPERLIPIDEMIA 02/15/2007  . Essential hypertension 02/15/2007  . Allergic rhinitis 02/15/2007  . GERD 02/15/2007  . OSTEOARTHRITIS 02/15/2007  . Impaired fasting glucose 02/15/2007   Donato Heinz. Owens Shark PT  Norwood Levo 06/29/2019, 4:27 PM  [place], [place], [address] Phone: [phone]   Fax:  [phone]  Name: Chelsey [name] MRN: [id number] Date of Birth: [name] 10, 1946

## 2019-06-29 NOTE — Patient Instructions (Signed)
[email or web address] Resources ( at the right of the page) Self-care videos Self MLD to left upper extremity

## 2019-07-01 ENCOUNTER — Other Ambulatory Visit: Payer: Self-pay | Admitting: Internal Medicine

## 2019-07-04 ENCOUNTER — Other Ambulatory Visit: Payer: Self-pay | Admitting: Internal Medicine

## 2019-07-05 ENCOUNTER — Other Ambulatory Visit: Payer: Self-pay | Admitting: Internal Medicine

## 2019-07-05 ENCOUNTER — Ambulatory Visit: Payer: Medicare Other

## 2019-07-05 ENCOUNTER — Other Ambulatory Visit: Payer: Self-pay

## 2019-07-05 DIAGNOSIS — I972 Postmastectomy lymphedema syndrome: Secondary | ICD-10-CM

## 2019-07-05 DIAGNOSIS — R293 Abnormal posture: Secondary | ICD-10-CM

## 2019-07-05 DIAGNOSIS — M6281 Muscle weakness (generalized): Secondary | ICD-10-CM

## 2019-07-05 NOTE — Telephone Encounter (Signed)
Last ov:03/09/19 Last filled:09/20/2018

## 2019-07-05 NOTE — Therapy (Signed)
[place], [place], [address] Phone: [phone]   Fax:  [phone]  Physical Therapy Treatment  Patient Details  Name: Chelsey [name] MRN: [id number] Date of Birth: [birth year] Referring Provider (PT): Dr. Harvie Heck   Encounter Date: 07/05/2019  PT End of Session - 07/05/19 1101    Visit Number  3    Number of Visits  17    Date for PT Re-Evaluation  08/26/19    PT Start Time  1004    PT Stop Time  1051    PT Time Calculation (min)  47 min    Activity Tolerance  Patient tolerated treatment well    Behavior During Therapy  [facility] for tasks assessed/performed       Past Medical History:  Diagnosis Date  . Allergy   . Aphthous ulcer of mouth 2010   related to chemotherapy   . Breast cancer ([place])   . Carpal tunnel syndrome of right wrist   . Colitis   . Colon polyp   . Diabetes mellitus   . Diverticulosis   . GERD (gastroesophageal reflux disease)   . Hyperlipidemia   . Hypertension   . Lymphedema 08-31-13   left arm  . Osteoarthritis   . Pneumonia 07/28/2011  . Sinusitis   . Sleep apnea    setting of 10  . Squamous cell carcinoma    left leg    Past Surgical History:  Procedure Laterality Date  . AUGMENTATION MAMMAPLASTY Left   . CESAREAN SECTION     x 3  . MASTECTOMY Left 11/23   2010  . RECONSTRUCTION BREAST IMMEDIATE / DELAYED W/ TISSUE EXPANDER Left   . REDUCTION MAMMAPLASTY Right   . SKIN GRAFT    . SQUAMOUS CELL CARCINOMA EXCISION Left    Left lower extremity  . TONSILLECTOMY     as child  . TOTAL KNEE ARTHROPLASTY Left 12/20/2012   Procedure: TOTAL KNEE ARTHROPLASTY;  Surgeon: Gearlean Alf, MD;  Location: [facility];  Service: Orthopedics;  Laterality: Left;  . TOTAL KNEE ARTHROPLASTY Right 09/12/2013   Procedure: RIGHT TOTAL KNEE ARTHROPLASTY;  Surgeon: Gearlean Alf, MD;  [facility];  Service: Orthopedics;  Laterality: Right;  . TUBAL LIGATION      There were no  vitals filed for this visit.  Subjective Assessment - 07/05/19 1014    Subjective  My Lt arm felt good after my last session.    Patient Stated Goals  She wants to get her arm back under control    Currently in Pain?  No/denies                       [facility] Adult PT Treatment/Exercise - 07/05/19 0001      Self-Care   Other Self-Care Comments   Spent about 10 mins at beginning of session reviewing with pt how lymphedema can become more aggressive when left untreated, or minimally treated which hsa been her case. Encouraged her to at least become re-educated with bandaging even if she doesn't want to go through being bandaged each time at our clinic so her and husband can resume bandaging prn at home. Pt was agreeable this but does not want to pursue a new compression gamrnet at this time.       Manual Therapy   Manual therapy comments  Spoke with pt about looking into compression bra as she is beginning to notice more upper back swelling as  well and she is agreeable to this    Manual Lymphatic Drainage (MLD)  in supine, short neck, superficial and deep abdominals, right axillary nodes, anterior inter-axillary anastamosis,  left inguinal nodes and left axillo-inguinal anastamosis, left shoulder arm, forearm and hand with return along pathways with extra time spent on firm areas of forearm and at medial elbow, then to right sidelying for posterior interaxillary anastamosis and further work along Lt axillo-inguinal anastomosis               PT Short Term Goals - 06/28/19 1638      PT SHORT TERM GOAL #1   Title  Pt will be independent in self MLD , elevation and remedial exercise    Time  4    Period  Weeks    Status  New      PT SHORT TERM GOAL #2   Title  Pt will have reduction of left arm circumference at 10 cm proximal to ulnar styolid by 1 cm    Baseline  30.5 on 9/15    Time  4    Period  Weeks    Status  New        PT Long Term Goals - 06/28/19 1639       PT LONG TERM GOAL #1   Title  Pt will be able to manage her lymphedema at home with use of elevation exercise, compression garments and compression pump    Time  8    Period  Weeks    Status  New      PT LONG TERM GOAL #2   Title  Pt will be independent in a HEP for shoulder strength    Time  8    Period  Weeks    Status  New      PT LONG TERM GOAL #3   Title  Pt will have decrease in left UE at 10 cm proximal to ulnar styloid by 2 cm    Baseline  30.5 on 9/15    Time  8    Period  Weeks    Status  New            Plan - 07/05/19 1101    Clinical Impression Statement  Pt with visbily increased lymphedema at forearm and dorsum of hand today. Softening noted by end of manual lymph drainage and pt reports les tight and full feeling. Discussed during session that now that pts lymphedema has become more aggressive it will be beneficial to her to be more compliant with wearing compression garments or bandages during day and she was agreeable to being re-educated on how to bandage saying she would do this on days she worked from home. She would like to bring her husband as well to be instructed again as he did it mostly for her and is still willing to assist. Pt would like to hold off on new compression garments for now as she just spent over 400 on new Reidsleeve. Instructed pt she could also wear this during the day when at home when she has times like today of increased swelling. Pt verbalized understanding all. She is also interested in learning Strength ABC once we are able to instruct her in that also. Sent script for compression bra and compression garments (if pt decides to get later).    Personal Factors and Comorbidities  Comorbidity 3+    Comorbidities  mastectomy with ~ 10 nodes removed ,radiation, DM    Stability/Clinical Decision Making  Stable/Uncomplicated    Rehab Potential  Good    PT Frequency  2x / week    PT Duration  8 weeks    PT Treatment/Interventions  ADLs/Self  Care Home Management;DME Instruction;Therapeutic activities;Therapeutic exercise;Orthotic Fit/Training;Manual techniques;Manual lymph drainage;Passive range of motion;Scar mobilization;Taping;Compression bandaging    PT Next Visit Plan  Issue script for bra if returned. cont Manual lymph drainage and review  self  MLD, when pts husband is able to come instruct them in bandaging and have them record with phone; Remedial exercise and progress to shoulder strengthening, help faciliate compression pump    Recommended Other Services  Sent script for compression bra and garments (if pt decides to get later)    Consulted and Agree with Plan of Care  Patient       Patient will benefit from skilled therapeutic intervention in order to improve the following deficits and impairments:  Decreased scar mobility, Increased fascial restricitons, Impaired UE functional use, Increased edema, Obesity, Postural dysfunction  Visit Diagnosis: Postmastectomy lymphedema  Abnormal posture  Muscle weakness (generalized)     Problem List Patient Active Problem List   Diagnosis Date Noted  . Abnormal chest CT 12/27/2018  . Obesity hypoventilation syndrome ([place]) 10/26/2018  . Seasonal allergic conjunctivitis 06/17/2017  . Medicare annual wellness visit, initial 06/08/2014  . Anemia 11/24/2013  . Breast cancer of upper-outer quadrant of left female breast ([place]) 04/07/2013  . S/P TKR (total knee replacement) 03/21/2013  . Sleep disturbance, unspecified 03/21/2013  . Abnormal LFTs 03/21/2013  . OA (osteoarthritis) of knee 12/20/2012  . Degenerative joint disease of knee 11/05/2012  . Diabetes mellitus, type 2 ([place]) 11/05/2012  . Adjustment disorder 03/13/2012  . Neuritis 07/28/2011  . Obstructive sleep apnea 05/28/2009  . Malignant neoplasm of female breast ([place]) 04/07/2009  . RHINITIS, CHRONIC 02/21/2009  . HYPERLIPIDEMIA 02/15/2007  . Essential hypertension 02/15/2007  . Allergic rhinitis 02/15/2007  .  GERD 02/15/2007  . OSTEOARTHRITIS 02/15/2007  . Impaired fasting glucose 02/15/2007    Otelia Limes, PTA 07/05/2019, 12:05 PM  [place], [place], [address] Phone: [phone]   Fax:  [phone]  Name: Chelsey [name] MRN: [id number] Date of Birth: 1946/08/20

## 2019-07-06 ENCOUNTER — Other Ambulatory Visit: Payer: Self-pay | Admitting: Internal Medicine

## 2019-07-07 ENCOUNTER — Other Ambulatory Visit: Payer: Self-pay

## 2019-07-07 ENCOUNTER — Ambulatory Visit: Payer: Medicare Other | Admitting: Physical Therapy

## 2019-07-07 DIAGNOSIS — I972 Postmastectomy lymphedema syndrome: Secondary | ICD-10-CM

## 2019-07-07 DIAGNOSIS — R293 Abnormal posture: Secondary | ICD-10-CM | POA: Diagnosis not present

## 2019-07-07 DIAGNOSIS — M6281 Muscle weakness (generalized): Secondary | ICD-10-CM

## 2019-07-07 NOTE — Patient Instructions (Signed)
Good afternoon Helene Kelp,   Depending on the severity of Ms Mathieson's condition we may be able to get her covered with the advanced pump, I would need records, and to complete an intake to be sure ??  She has an 80/20 cost share plan with [employer] and the 20% coinsurance amounts for both pumps are below.  Let me know if you want me to reach out to her.  Take care,   Basic Pump PCD51 with Comfy Lite/8 chambers: 20% cost share is $136 Sequential only [email or web address]/[email or web address]/ [email or web address]/products/[email or web address]   Advanced Pump Optimal Plus with Comfy Arm/12 chambers: 20% cost share is $576.78 Sequential, Pre/Post therapy and Peristaltic https://[email or web address]/products/lympha-press-[email or web address]/ [email or web address]/products/comfy-[email or web address]/     Debbora Presto Case Development Specialist Toll Free: [phone] ext. [phone]   Direct: [phone] Fax: [phone]

## 2019-07-07 NOTE — Therapy (Signed)
[place], [place], [address] Phone: [phone]   Fax:  [phone]  Physical Therapy Treatment  Patient Details  Name: Chelsey [name] MRN: [id number] Date of Birth: Aug 15, 1946 Referring Provider (PT): Dr. Harvie Heck   Encounter Date: 07/07/2019  PT End of Session - 07/07/19 1158    Visit Number  4    Number of Visits  17    Date for PT Re-Evaluation  08/26/19    PT Start Time  1115    PT Stop Time  1154    PT Time Calculation (min)  39 min       Past Medical History:  Diagnosis Date  . Allergy   . Aphthous ulcer of mouth 2010   related to chemotherapy   . Breast cancer ([place])   . Carpal tunnel syndrome of right wrist   . Colitis   . Colon polyp   . Diabetes mellitus   . Diverticulosis   . GERD (gastroesophageal reflux disease)   . Hyperlipidemia   . Hypertension   . Lymphedema 08-31-13   left arm  . Osteoarthritis   . Pneumonia 07/28/2011  . Sinusitis   . Sleep apnea    setting of 10  . Squamous cell carcinoma    left leg    Past Surgical History:  Procedure Laterality Date  . AUGMENTATION MAMMAPLASTY Left   . CESAREAN SECTION     x 3  . MASTECTOMY Left 11/23   2010  . RECONSTRUCTION BREAST IMMEDIATE / DELAYED W/ TISSUE EXPANDER Left   . REDUCTION MAMMAPLASTY Right   . SKIN GRAFT    . SQUAMOUS CELL CARCINOMA EXCISION Left    Left lower extremity  . TONSILLECTOMY     as child  . TOTAL KNEE ARTHROPLASTY Left 12/20/2012   Procedure: TOTAL KNEE ARTHROPLASTY;  Surgeon: Gearlean Alf, MD;  [facility];  Service: Orthopedics;  Laterality: Left;  . TOTAL KNEE ARTHROPLASTY Right 09/12/2013   Procedure: RIGHT TOTAL KNEE ARTHROPLASTY;  Surgeon: Gearlean Alf, MD;  [facility];  Service: Orthopedics;  Laterality: Right;  . TUBAL LIGATION      There were no vitals filed for this visit.  Subjective Assessment - 07/07/19 1116    Subjective  "Its not been good this week"   Pt has not heard from [facility] yet.  She is wearing her Maceo Pro and gets some fluid movment with that but her arm fills right up.  Her circular knit compression sleeve pinches at the top and at the wrist by the afternoon    Pertinent History  left breast cancer with mastectomy 09/04/2009 had 2 nodes removed and then had to have a second surgery for 8 or 9 more removed.  Prior to surgery , she had chemotherapy , after surgery radiation, Then had reconstruction with a latt flap.  She still has tightness in her shoulder. Past history includes DM and bilateral knee replacement    Currently in Pain?  No/denies                       [facility] Adult PT Treatment/Exercise - 07/07/19 0001      Manual Therapy   Edema Management  gave pt information about possible pricing from [employer]. encouraged her to consider bandaging and talked about how she may need to get a flat knit sleeve and glove.  It would probably work with her schedule to get measured at [facility]  Manual Lymphatic Drainage (MLD)  in supine, short neck, superficial and deep abdominals, right axillary nodes, anterior inter-axillary anastamosis,  left inguinal nodes and left axillo-inguinal anastamosis, left shoulder arm, forearm and hand with return along pathways with extra time spent on firm areas of forearm and at medial elbow, then to right sidelying for posterior interaxillary anastamosis and further work along Lt axillo-inguinal anastomosis               PT Short Term Goals - 06/28/19 1638      PT SHORT TERM GOAL #1   Title  Pt will be independent in self MLD , elevation and remedial exercise    Time  4    Period  Weeks    Status  New      PT SHORT TERM GOAL #2   Title  Pt will have reduction of left arm circumference at 10 cm proximal to ulnar styolid by 1 cm    Baseline  30.5 on 9/15    Time  4    Period  Weeks    Status  New        PT Long Term Goals - 06/28/19 1639      PT LONG TERM  GOAL #1   Title  Pt will be able to manage her lymphedema at home with use of elevation exercise, compression garments and compression pump    Time  8    Period  Weeks    Status  New      PT LONG TERM GOAL #2   Title  Pt will be independent in a HEP for shoulder strength    Time  8    Period  Weeks    Status  New      PT LONG TERM GOAL #3   Title  Pt will have decrease in left UE at 10 cm proximal to ulnar styloid by 2 cm    Baseline  30.5 on 9/15    Time  8    Period  Weeks    Status  New            Plan - 07/07/19 1158    Clinical Impression Statement  Pt comes in late for appt, rushing due to traffic.  Her left arm is visibly swollen.  Pt agrees to doing bandaging next session as she is not getting lasting relief with just MLD ( though it does feel better to her after treatment) and Maceo Pro.  She will bring her husband in next session    Comorbidities  mastectomy with ~ 10 nodes removed ,radiation, DM    PT Duration  8 weeks    PT Treatment/Interventions  ADLs/Self Care Home Management;DME Instruction;Therapeutic activities;Therapeutic exercise;Orthotic Fit/Training;Manual techniques;Manual lymph drainage;Passive range of motion;Scar mobilization;Taping;Compression bandaging    PT Next Visit Plan  Issue script for bra if returned. cont Manual lymph drainage and review  self  MLD, when pts husband is able to come instruct them in bandaging and have them record with phone; Remedial exercise and progress to shoulder strengthening, help faciliate compression pump    Consulted and Agree with Plan of Care  Patient       Patient will benefit from skilled therapeutic intervention in order to improve the following deficits and impairments:  Decreased scar mobility, Increased fascial restricitons, Impaired UE functional use, Increased edema, Obesity, Postural dysfunction  Visit Diagnosis: Postmastectomy lymphedema  Muscle weakness (generalized)  Abnormal  posture     Problem List Patient Active Problem  List   Diagnosis Date Noted  . Abnormal chest CT 12/27/2018  . Obesity hypoventilation syndrome ([place]) 10/26/2018  . Seasonal allergic conjunctivitis 06/17/2017  . Medicare annual wellness visit, initial 06/08/2014  . Anemia 11/24/2013  . Breast cancer of upper-outer quadrant of left female breast ([place]) 04/07/2013  . S/P TKR (total knee replacement) 03/21/2013  . Sleep disturbance, unspecified 03/21/2013  . Abnormal LFTs 03/21/2013  . OA (osteoarthritis) of knee 12/20/2012  . Degenerative joint disease of knee 11/05/2012  . Diabetes mellitus, type 2 ([place]) 11/05/2012  . Adjustment disorder 03/13/2012  . Neuritis 07/28/2011  . Obstructive sleep apnea 05/28/2009  . Malignant neoplasm of female breast ([place]) 04/07/2009  . RHINITIS, CHRONIC 02/21/2009  . HYPERLIPIDEMIA 02/15/2007  . Essential hypertension 02/15/2007  . Allergic rhinitis 02/15/2007  . GERD 02/15/2007  . OSTEOARTHRITIS 02/15/2007  . Impaired fasting glucose 02/15/2007   Donato Heinz. Owens Shark PT  Norwood Levo 07/07/2019, 12:01 PM  [place], [place], [address] Phone: [phone]   Fax:  [phone]  Name: LISMARIE FIELDING MRN: [id number] Date of Birth: [birth year]

## 2019-07-12 ENCOUNTER — Ambulatory Visit: Payer: Medicare Other | Admitting: Physical Therapy

## 2019-07-12 ENCOUNTER — Other Ambulatory Visit: Payer: Self-pay

## 2019-07-12 DIAGNOSIS — M6281 Muscle weakness (generalized): Secondary | ICD-10-CM | POA: Diagnosis not present

## 2019-07-12 DIAGNOSIS — I972 Postmastectomy lymphedema syndrome: Secondary | ICD-10-CM | POA: Diagnosis not present

## 2019-07-12 DIAGNOSIS — R293 Abnormal posture: Secondary | ICD-10-CM | POA: Diagnosis not present

## 2019-07-12 NOTE — Therapy (Signed)
[place], [place], [address] Phone: [phone]   Fax:  [phone]  Physical Therapy Treatment  Patient Details  Name: Chelsey [name] MRN: [id number] Date of Birth: Sep 08, 1946 Referring Provider (PT): Dr. Harvie Heck   Encounter Date: 07/12/2019  PT End of Session - 07/12/19 1527    Visit Number  5    Number of Visits  17    Date for PT Re-Evaluation  08/26/19    PT Start Time  1445   pt arrives late   PT Stop Time  1520    PT Time Calculation (min)  35 min    Activity Tolerance  Patient tolerated treatment well    Behavior During Therapy  [facility] for tasks assessed/performed       Past Medical History:  Diagnosis Date  . Allergy   . Aphthous ulcer of mouth 2010   related to chemotherapy   . Breast cancer ([place])   . Carpal tunnel syndrome of right wrist   . Colitis   . Colon polyp   . Diabetes mellitus   . Diverticulosis   . GERD (gastroesophageal reflux disease)   . Hyperlipidemia   . Hypertension   . Lymphedema 08-31-13   left arm  . Osteoarthritis   . Pneumonia 07/28/2011  . Sinusitis   . Sleep apnea    setting of 10  . Squamous cell carcinoma    left leg    Past Surgical History:  Procedure Laterality Date  . AUGMENTATION MAMMAPLASTY Left   . CESAREAN SECTION     x 3  . MASTECTOMY Left 11/23   2010  . RECONSTRUCTION BREAST IMMEDIATE / DELAYED W/ TISSUE EXPANDER Left   . REDUCTION MAMMAPLASTY Right   . SKIN GRAFT    . SQUAMOUS CELL CARCINOMA EXCISION Left    Left lower extremity  . TONSILLECTOMY     as child  . TOTAL KNEE ARTHROPLASTY Left 12/20/2012   Procedure: TOTAL KNEE ARTHROPLASTY;  Surgeon: Gearlean Alf, MD;  [facility];  Service: Orthopedics;  Laterality: Left;  . TOTAL KNEE ARTHROPLASTY Right 09/12/2013   Procedure: RIGHT TOTAL KNEE ARTHROPLASTY;  Surgeon: Gearlean Alf, MD;  [facility];  Service: Orthopedics;  Laterality: Right;  . TUBAL LIGATION       There were no vitals filed for this visit.         LYMPHEDEMA/ONCOLOGY QUESTIONNAIRE - 07/12/19 1449      Left Upper Extremity Lymphedema   10 cm Proximal to Olecranon Process  36.5 cm    Olecranon Process  32.5 cm    15 cm Proximal to Ulnar Styloid Process  34 cm    10 cm Proximal to Ulnar Styloid Process  30 cm    Just Proximal to Ulnar Styloid Process  20 cm    Across Hand at [employer]  19.9 cm    At [place] of 2nd Digit  7 cm                [facility] Adult PT Treatment/Exercise - 07/12/19 0001      Manual Therapy   Manual Therapy  Compression Bandaging    Manual therapy comments  remeasured arm     Edema Management  called [facility] representatives to make sure they get in touch with patient as she has been missing their calls.  Pt will contact them.     Compression Bandaging  husband here for insruction in compression bandaging.  Pt  given link for [employer] website for video on bandaging.  Pt applied lotion to arm.  thick stockiette [place] to fingers 1-4 2 artiflex 1-6, 1-8 and 2-10 cm bandages with 8 inherringbone pattern on forearm.  Instructed pt and husband in bandaging and there are familiar from having done it before. pt may put glove on hand and bandage from artiflex to wrist and up arm. pt instructed to bring all bandages back next session                PT Short Term Goals - 06/28/19 1638      PT SHORT TERM GOAL #1   Title  Pt will be independent in self MLD , elevation and remedial exercise    Time  4    Period  Weeks    Status  New      PT SHORT TERM GOAL #2   Title  Pt will have reduction of left arm circumference at 10 cm proximal to ulnar styolid by 1 cm    Baseline  30.5 on 9/15    Time  4    Period  Weeks    Status  New        PT Long Term Goals - 06/28/19 1639      PT LONG TERM GOAL #1   Title  Pt will be able to manage her lymphedema at home with use of elevation exercise, compression garments and compression  pump    Time  8    Period  Weeks    Status  New      PT LONG TERM GOAL #2   Title  Pt will be independent in a HEP for shoulder strength    Time  8    Period  Weeks    Status  New      PT LONG TERM GOAL #3   Title  Pt will have decrease in left UE at 10 cm proximal to ulnar styloid by 2 cm    Baseline  30.5 on 9/15    Time  8    Period  Weeks    Status  New            Plan - 07/12/19 1528    Clinical Impression Statement  pt has had some reduction in hand with MLD and use of [employer]. Anticipate she will have better reduction with bandaging and she was instructed in that today.  Pt given signed prescription for more compression garments and she will go to [facility] [place]    Comorbidities  mastectomy with ~ 10 nodes removed ,radiation, DM    PT Frequency  2x / week    PT Treatment/Interventions  ADLs/Self Care Home Management;DME Instruction;Therapeutic activities;Therapeutic exercise;Orthotic Fit/Training;Manual techniques;Manual lymph drainage;Passive range of motion;Scar mobilization;Taping;Compression bandaging    PT Next Visit Plan  Review cont Manual lymph drainage and bandaging ask if pt wants to record on their phone Remedial exercise and progress to shoulder strengthening, help faciliate compression pump    Consulted and Agree with Plan of Care  Patient       Patient will benefit from skilled therapeutic intervention in order to improve the following deficits and impairments:  Decreased scar mobility, Increased fascial restricitons, Impaired UE functional use, Increased edema, Obesity, Postural dysfunction  Visit Diagnosis: Postmastectomy lymphedema  Muscle weakness (generalized)  Abnormal posture     Problem List Patient Active Problem List   Diagnosis Date Noted  . Abnormal chest CT 12/27/2018  . Obesity hypoventilation  syndrome ([place]) 10/26/2018  . Seasonal allergic conjunctivitis 06/17/2017  . Medicare annual wellness visit, initial 06/08/2014  .  Anemia 11/24/2013  . Breast cancer of upper-outer quadrant of left female breast ([place]) 04/07/2013  . S/P TKR (total knee replacement) 03/21/2013  . Sleep disturbance, unspecified 03/21/2013  . Abnormal LFTs 03/21/2013  . OA (osteoarthritis) of knee 12/20/2012  . Degenerative joint disease of knee 11/05/2012  . Diabetes mellitus, type 2 ([place]) 11/05/2012  . Adjustment disorder 03/13/2012  . Neuritis 07/28/2011  . Obstructive sleep apnea 05/28/2009  . Malignant neoplasm of female breast ([place]) 04/07/2009  . RHINITIS, CHRONIC 02/21/2009  . HYPERLIPIDEMIA 02/15/2007  . Essential hypertension 02/15/2007  . Allergic rhinitis 02/15/2007  . GERD 02/15/2007  . OSTEOARTHRITIS 02/15/2007  . Impaired fasting glucose 02/15/2007   Donato Heinz. Owens Shark PT  Norwood Levo 07/12/2019, 3:31 PM  [place], [place], [address] Phone: [phone]   Fax:  [phone]  Name: Chelsey [name] MRN: [id number] Date of Birth: 1946-03-30

## 2019-07-12 NOTE — Patient Instructions (Signed)
[email or web address] On the right side go to the resources tab Self care videos Arm bandaging

## 2019-07-14 ENCOUNTER — Ambulatory Visit: Payer: Medicare Other | Attending: Internal Medicine | Admitting: Physical Therapy

## 2019-07-14 ENCOUNTER — Other Ambulatory Visit: Payer: Self-pay

## 2019-07-14 DIAGNOSIS — M6281 Muscle weakness (generalized): Secondary | ICD-10-CM | POA: Diagnosis not present

## 2019-07-14 DIAGNOSIS — R293 Abnormal posture: Secondary | ICD-10-CM

## 2019-07-14 DIAGNOSIS — I972 Postmastectomy lymphedema syndrome: Secondary | ICD-10-CM | POA: Diagnosis not present

## 2019-07-14 NOTE — Therapy (Signed)
[place], [place], [address] Phone: [phone]   Fax:  [phone]  Physical Therapy Treatment  Patient Details  Name: Chelsey [name] MRN: [id number] Date of Birth: 1946-06-07 Referring Provider (PT): Dr. Harvie Heck   Encounter Date: 07/14/2019  PT End of Session - 07/14/19 1422    Visit Number  6    Number of Visits  17    Date for PT Re-Evaluation  08/26/19    PT Start Time  1325    PT Stop Time  1410    PT Time Calculation (min)  45 min    Activity Tolerance  Patient tolerated treatment well    Behavior During Therapy  [facility] for tasks assessed/performed       Past Medical History:  Diagnosis Date  . Allergy   . Aphthous ulcer of mouth 2010   related to chemotherapy   . Breast cancer ([place])   . Carpal tunnel syndrome of right wrist   . Colitis   . Colon polyp   . Diabetes mellitus   . Diverticulosis   . GERD (gastroesophageal reflux disease)   . Hyperlipidemia   . Hypertension   . Lymphedema 08-31-13   left arm  . Osteoarthritis   . Pneumonia 07/28/2011  . Sinusitis   . Sleep apnea    setting of 10  . Squamous cell carcinoma    left leg    Past Surgical History:  Procedure Laterality Date  . AUGMENTATION MAMMAPLASTY Left   . CESAREAN SECTION     x 3  . MASTECTOMY Left 11/23   2010  . RECONSTRUCTION BREAST IMMEDIATE / DELAYED W/ TISSUE EXPANDER Left   . REDUCTION MAMMAPLASTY Right   . SKIN GRAFT    . SQUAMOUS CELL CARCINOMA EXCISION Left    Left lower extremity  . TONSILLECTOMY     as child  . TOTAL KNEE ARTHROPLASTY Left 12/20/2012   Procedure: TOTAL KNEE ARTHROPLASTY;  Surgeon: Gearlean Alf, MD;  Location: [facility];  Service: Orthopedics;  Laterality: Left;  . TOTAL KNEE ARTHROPLASTY Right 09/12/2013   Procedure: RIGHT TOTAL KNEE ARTHROPLASTY;  Surgeon: Gearlean Alf, MD;  Location: [facility];  Service: Orthopedics;  Laterality: Right;  . TUBAL LIGATION      There were no  vitals filed for this visit.  Subjective Assessment - 07/14/19 1326    Subjective  Pt states that she has to take her daughter for surgery tomorrow and so she doesn't want to be bandaged today.  She wore the bandages til last night She has not yet connected with the lymphapress people so another email was sent to the today    Pertinent History  left breast cancer with mastectomy 09/04/2009 had 2 nodes removed and then had to have a second surgery for 8 or 9 more removed.  Prior to surgery , she had chemotherapy , after surgery radiation, Then had reconstruction with a latt flap.  She still has tightness in her shoulder. Past history includes DM and bilateral knee replacement    Patient Stated Goals  She wants to get her arm back under control    Currently in Pain?  No/denies                       [facility] Adult PT Treatment/Exercise - 07/14/19 0001      Manual Therapy   Manual Therapy  Manual Lymphatic Drainage (MLD)    Edema Management  gave  pt some peach lined foam to wear under Joneen Caraway Sleeve at forearm to see if that will help with decongestion     Manual Lymphatic Drainage (MLD)  in supine, short neck, superficial and deep abdominals, right axillary nodes, anterior inter-axillary anastamosis,  left inguinal nodes and left axillo-inguinal anastamosis, left shoulder arm, forearm and hand with return along pathways with extra time spent on firm areas of forearm and at medial elbow, then to right sidelying for posterior interaxillary anastamosis and further work along Lt axillo-inguinal anastomosis               PT Short Term Goals - 06/28/19 1638      PT SHORT TERM GOAL #1   Title  Pt will be independent in self MLD , elevation and remedial exercise    Time  4    Period  Weeks    Status  New      PT SHORT TERM GOAL #2   Title  Pt will have reduction of left arm circumference at 10 cm proximal to ulnar styolid by 1 cm    Baseline  30.5 on 9/15    Time  4    Period   Weeks    Status  New        PT Long Term Goals - 06/28/19 1639      PT LONG TERM GOAL #1   Title  Pt will be able to manage her lymphedema at home with use of elevation exercise, compression garments and compression pump    Time  8    Period  Weeks    Status  New      PT LONG TERM GOAL #2   Title  Pt will be independent in a HEP for shoulder strength    Time  8    Period  Weeks    Status  New      PT LONG TERM GOAL #3   Title  Pt will have decrease in left UE at 10 cm proximal to ulnar styloid by 2 cm    Baseline  30.5 on 9/15    Time  8    Period  Weeks    Status  New            Plan - 07/14/19 1422    Clinical Impression Statement  Pt has not tried wrapping at home yet. She still has much congestion in forearm, but hand is better.  She has other obligations with work and family now, but wants to be able to get arm reduced.  She is looking forward to getting her compression pump    Comorbidities  mastectomy with ~ 10 nodes removed ,radiation, DM    PT Frequency  2x / week    PT Duration  8 weeks    PT Next Visit Plan  Review cont Manual lymph drainage and bandaging ask if pt wants to record on their phone Remedial exercise and progress to shoulder strengthening, help faciliate compression pump    Consulted and Agree with Plan of Care  Patient       Patient will benefit from skilled therapeutic intervention in order to improve the following deficits and impairments:  Decreased scar mobility, Increased fascial restricitons, Impaired UE functional use, Increased edema, Obesity, Postural dysfunction  Visit Diagnosis: Postmastectomy lymphedema  Muscle weakness (generalized)  Abnormal posture     Problem List Patient Active Problem List   Diagnosis Date Noted  . Abnormal chest CT 12/27/2018  . Obesity hypoventilation  syndrome ([place]) 10/26/2018  . Seasonal allergic conjunctivitis 06/17/2017  . Medicare annual wellness visit, initial 06/08/2014  . Anemia  11/24/2013  . Breast cancer of upper-outer quadrant of left female breast ([place]) 04/07/2013  . S/P TKR (total knee replacement) 03/21/2013  . Sleep disturbance, unspecified 03/21/2013  . Abnormal LFTs 03/21/2013  . OA (osteoarthritis) of knee 12/20/2012  . Degenerative joint disease of knee 11/05/2012  . Diabetes mellitus, type 2 ([place]) 11/05/2012  . Adjustment disorder 03/13/2012  . Neuritis 07/28/2011  . Obstructive sleep apnea 05/28/2009  . Malignant neoplasm of female breast ([place]) 04/07/2009  . RHINITIS, CHRONIC 02/21/2009  . HYPERLIPIDEMIA 02/15/2007  . Essential hypertension 02/15/2007  . Allergic rhinitis 02/15/2007  . GERD 02/15/2007  . OSTEOARTHRITIS 02/15/2007  . Impaired fasting glucose 02/15/2007   Donato Heinz. Owens Shark PT  Norwood Levo 07/14/2019, 2:24 PM  [place], [place], [address] Phone: [phone]   Fax:  [phone]  Name: Chelsey [name] MRN: [id number] Date of Birth: 03-08-1946

## 2019-07-19 ENCOUNTER — Other Ambulatory Visit: Payer: Self-pay

## 2019-07-19 ENCOUNTER — Encounter: Payer: Self-pay | Admitting: Physical Therapy

## 2019-07-19 ENCOUNTER — Ambulatory Visit: Payer: Medicare Other | Admitting: Physical Therapy

## 2019-07-19 DIAGNOSIS — I972 Postmastectomy lymphedema syndrome: Secondary | ICD-10-CM

## 2019-07-19 DIAGNOSIS — R293 Abnormal posture: Secondary | ICD-10-CM

## 2019-07-19 DIAGNOSIS — M6281 Muscle weakness (generalized): Secondary | ICD-10-CM

## 2019-07-19 NOTE — Patient Instructions (Signed)
[email or web address]

## 2019-07-19 NOTE — Therapy (Signed)
[place], [place], [address] Phone: [phone]   Fax:  [phone]  Physical Therapy Treatment  Patient Details  Name: Chelsey [name] MRN: [id number] Date of Birth: 05-27-1946 Referring Provider (PT): Dr. Harvie Heck   Encounter Date: 07/19/2019  PT End of Session - 07/19/19 1742    Visit Number  7    Number of Visits  17    Date for PT Re-Evaluation  08/26/19    PT Start Time  [id number]    PT Stop Time  1445    PT Time Calculation (min)  40 min    Activity Tolerance  Patient tolerated treatment well    Behavior During Therapy  [facility] for tasks assessed/performed       Past Medical History:  Diagnosis Date  . Allergy   . Aphthous ulcer of mouth 2010   related to chemotherapy   . Breast cancer ([place])   . Carpal tunnel syndrome of right wrist   . Colitis   . Colon polyp   . Diabetes mellitus   . Diverticulosis   . GERD (gastroesophageal reflux disease)   . Hyperlipidemia   . Hypertension   . Lymphedema 08-31-13   left arm  . Osteoarthritis   . Pneumonia 07/28/2011  . Sinusitis   . Sleep apnea    setting of 10  . Squamous cell carcinoma    left leg    Past Surgical History:  Procedure Laterality Date  . AUGMENTATION MAMMAPLASTY Left   . CESAREAN SECTION     x 3  . MASTECTOMY Left 11/23   2010  . RECONSTRUCTION BREAST IMMEDIATE / DELAYED W/ TISSUE EXPANDER Left   . REDUCTION MAMMAPLASTY Right   . SKIN GRAFT    . SQUAMOUS CELL CARCINOMA EXCISION Left    Left lower extremity  . TONSILLECTOMY     as child  . TOTAL KNEE ARTHROPLASTY Left 12/20/2012   Procedure: TOTAL KNEE ARTHROPLASTY;  Surgeon: Gearlean Alf, MD;  [facility];  Service: Orthopedics;  Laterality: Left;  . TOTAL KNEE ARTHROPLASTY Right 09/12/2013   Procedure: RIGHT TOTAL KNEE ARTHROPLASTY;  Surgeon: Gearlean Alf, MD;  [facility];  Service: Orthopedics;  Laterality: Right;  . TUBAL LIGATION      There were no  vitals filed for this visit.  Subjective Assessment - 07/19/19 1557    Subjective  Pt comes in wearing her daytime circular knit sleeve and her flat knit glove.  She is swelling out of her sleeve and there is an indention in her arm above the wrist portion of her glove.  She is waiting to get her compression pump. She needs to be measured for new flat knit garments when she reduces. She brings her husband in today for review of how to apply compression bandages    Pertinent History  left breast cancer with mastectomy 09/04/2009 had 2 nodes removed and then had to have a second surgery for 8 or 9 more removed.  Prior to surgery , she had chemotherapy , after surgery radiation, Then had reconstruction with a latt flap.  She still has tightness in her shoulder. Past history includes DM and bilateral knee replacement    Currently in Pain?  No/denies            LYMPHEDEMA/ONCOLOGY QUESTIONNAIRE - 07/19/19 1514      Left Upper Extremity Lymphedema   10 cm Proximal to Olecranon Process  36 cm    Olecranon Process  32.5 cm    15 cm Proximal to Ulnar Styloid Process  34.2 cm    10 cm Proximal to Ulnar Styloid Process  30.5 cm    Just Proximal to Ulnar Styloid Process  19.6 cm    Across Hand at [employer]  19.9 cm    At [place] of 2nd Digit  6.5 cm                [facility] Adult PT Treatment/Exercise - 07/19/19 0001      Manual Therapy   Manual therapy comments  remeasured arm     Edema Management  Contacted [facility] about getting compression pump     Compression Bandaging  instructed and watches as pt applied compression bandaging : glove applied to hand, then tg soft, 2 artiflex with thin foam at elbow, 6 cm to hand and wrist, 8 cm in herring bone at forearm the 2 -10 cm bandages from wrist to axilla                PT Short Term Goals - 07/19/19 1747      PT SHORT TERM GOAL #1   Title  Pt will be independent in self MLD , elevation and remedial exercise    Time  4     Status  Achieved      PT SHORT TERM GOAL #2   Title  Pt will have reduction of left arm circumference at 10 cm proximal to ulnar styolid by 1 cm    Baseline  30.5 on 9/15,, 30.5 on 07/19/2019    Status  On-going        PT Long Term Goals - 07/19/19 1748      PT LONG TERM GOAL #1   Title  Pt will be able to manage her lymphedema at home with use of elevation exercise, compression garments and compression pump    Time  8    Period  Weeks    Status  On-going      PT LONG TERM GOAL #2   Title  Pt will be independent in a HEP for shoulder strength    Time  8    Period  Weeks    Status  On-going      PT LONG TERM GOAL #3   Title  Pt will have decrease in left UE at 10 cm proximal to ulnar styloid by 2 cm    Baseline  30.5 on 9/15, 30.5 on 07/19/2019    Time  8    Period  Weeks    Status  On-going            Plan - 07/19/19 1743    Clinical Impression Statement  Pt has been wearing her Maceo Pro and her old compression garments for since this summer and has not been able to get her arm to consistently reduce with manual lymph drainage.  Her sleeves are too small and become uncomfortable.  Her husband has demonstrataed the ability to apply compression bandages and will do this for her daily in addition to continuing with Maceo Pro and her compression sleeve and self manual lymph drianage, elevation and exercises until she is able until she gets her pump and decongests to the point that she can get new flat knit sleevs.    Comorbidities  mastectomy with ~ 10 nodes removed ,radiation, DM    PT Treatment/Interventions  ADLs/Self Care Home Management;DME Instruction;Therapeutic activities;Therapeutic exercise;Orthotic Fit/Training;Manual techniques;Manual lymph drainage;Passive range of motion;Scar mobilization;Taping;Compression bandaging  PT Next Visit Plan  Remeasure.  continue with complete decongestive therapy and assit with getting flat knit garments    Consulted and Agree  with Plan of Care  Patient;Family member/caregiver    Family Member Consulted  husband       Patient will benefit from skilled therapeutic intervention in order to improve the following deficits and impairments:  Decreased scar mobility, Increased fascial restricitons, Impaired UE functional use, Increased edema, Obesity, Postural dysfunction  Visit Diagnosis: Postmastectomy lymphedema  Muscle weakness (generalized)  Abnormal posture     Problem List Patient Active Problem List   Diagnosis Date Noted  . Abnormal chest CT 12/27/2018  . Obesity hypoventilation syndrome ([place]) 10/26/2018  . Seasonal allergic conjunctivitis 06/17/2017  . Medicare annual wellness visit, initial 06/08/2014  . Anemia 11/24/2013  . Breast cancer of upper-outer quadrant of left female breast ([place]) 04/07/2013  . S/P TKR (total knee replacement) 03/21/2013  . Sleep disturbance, unspecified 03/21/2013  . Abnormal LFTs 03/21/2013  . OA (osteoarthritis) of knee 12/20/2012  . Degenerative joint disease of knee 11/05/2012  . Diabetes mellitus, type 2 ([place]) 11/05/2012  . Adjustment disorder 03/13/2012  . Neuritis 07/28/2011  . Obstructive sleep apnea 05/28/2009  . Malignant neoplasm of female breast ([place]) 04/07/2009  . RHINITIS, CHRONIC 02/21/2009  . HYPERLIPIDEMIA 02/15/2007  . Essential hypertension 02/15/2007  . Allergic rhinitis 02/15/2007  . GERD 02/15/2007  . OSTEOARTHRITIS 02/15/2007  . Impaired fasting glucose 02/15/2007   Donato Heinz. Owens Shark PT  Norwood Levo 07/19/2019, 5:49 PM  [place], [place], [address] Phone: [phone]   Fax:  [phone]  Name: KIAIRRA AQUILINA MRN: [id number] Date of Birth: October 31, 1945

## 2019-07-21 ENCOUNTER — Encounter: Payer: Medicare Other | Admitting: Physical Therapy

## 2019-07-22 DIAGNOSIS — I890 Lymphedema, not elsewhere classified: Secondary | ICD-10-CM | POA: Diagnosis not present

## 2019-07-25 DIAGNOSIS — Z124 Encounter for screening for malignant neoplasm of cervix: Secondary | ICD-10-CM | POA: Diagnosis not present

## 2019-07-25 LAB — HM PAP SMEAR

## 2019-08-01 ENCOUNTER — Telehealth: Payer: Self-pay | Admitting: *Deleted

## 2019-08-01 DIAGNOSIS — G4733 Obstructive sleep apnea (adult) (pediatric): Secondary | ICD-10-CM | POA: Diagnosis not present

## 2019-08-01 NOTE — Telephone Encounter (Signed)
Pt has been scheduled.  °

## 2019-08-01 NOTE — Telephone Encounter (Signed)
Copied from [place] [id number]. Topic: General - Other >> Aug 01, 2019  8:58 AM Pauline Good wrote: Reason for CRM: pt stated she need a A1C check

## 2019-08-03 ENCOUNTER — Other Ambulatory Visit (INDEPENDENT_AMBULATORY_CARE_PROVIDER_SITE_OTHER): Payer: Medicare Other

## 2019-08-03 ENCOUNTER — Other Ambulatory Visit: Payer: Self-pay

## 2019-08-03 DIAGNOSIS — E119 Type 2 diabetes mellitus without complications: Secondary | ICD-10-CM

## 2019-08-03 LAB — POCT GLYCOSYLATED HEMOGLOBIN (HGB A1C)
HbA1c POC (<> result, manual entry): 6.7 % (ref 4.0–5.6)
HbA1c, POC (controlled diabetic range): 0 % (ref 0.0–7.0)
HbA1c, POC (prediabetic range): 0 % — AB (ref 5.7–6.4)
Hemoglobin A1C: 6.7 % — AB (ref 4.0–5.6)

## 2019-08-04 ENCOUNTER — Ambulatory Visit: Payer: Medicare Other | Admitting: Physical Therapy

## 2019-08-04 ENCOUNTER — Encounter: Payer: Self-pay | Admitting: Physical Therapy

## 2019-08-04 DIAGNOSIS — R293 Abnormal posture: Secondary | ICD-10-CM | POA: Diagnosis not present

## 2019-08-04 DIAGNOSIS — M6281 Muscle weakness (generalized): Secondary | ICD-10-CM

## 2019-08-04 DIAGNOSIS — I972 Postmastectomy lymphedema syndrome: Secondary | ICD-10-CM

## 2019-08-04 NOTE — Patient Instructions (Signed)
[email or web address]  Resources Self care videos Bandaging  Self mld

## 2019-08-04 NOTE — Therapy (Signed)
[place], [place], [address] Phone: [phone]   Fax:  [phone]  Physical Therapy Treatment  Patient Details  Name: Chelsey [name] MRN: [id number] Date of Birth: 24-Dec-1945 Referring Provider (PT): Dr. Harvie Heck   Encounter Date: 08/04/2019  PT End of Session - 08/04/19 1658    Visit Number  8    Number of Visits  17    Date for PT Re-Evaluation  08/26/19    PT Start Time  1600    PT Stop Time  1645    PT Time Calculation (min)  45 min    Activity Tolerance  Patient tolerated treatment well    Behavior During Therapy  [facility] for tasks assessed/performed       Past Medical History:  Diagnosis Date  . Allergy   . Aphthous ulcer of mouth 2010   related to chemotherapy   . Breast cancer ([place])   . Carpal tunnel syndrome of right wrist   . Colitis   . Colon polyp   . Diabetes mellitus   . Diverticulosis   . GERD (gastroesophageal reflux disease)   . Hyperlipidemia   . Hypertension   . Lymphedema 08-31-13   left arm  . Osteoarthritis   . Pneumonia 07/28/2011  . Sinusitis   . Sleep apnea    setting of 10  . Squamous cell carcinoma    left leg    Past Surgical History:  Procedure Laterality Date  . AUGMENTATION MAMMAPLASTY Left   . CESAREAN SECTION     x 3  . MASTECTOMY Left 11/23   2010  . RECONSTRUCTION BREAST IMMEDIATE / DELAYED W/ TISSUE EXPANDER Left   . REDUCTION MAMMAPLASTY Right   . SKIN GRAFT    . SQUAMOUS CELL CARCINOMA EXCISION Left    Left lower extremity  . TONSILLECTOMY     as child  . TOTAL KNEE ARTHROPLASTY Left 12/20/2012   Procedure: TOTAL KNEE ARTHROPLASTY;  Surgeon: Gearlean Alf, MD;  [facility];  Service: Orthopedics;  Laterality: Left;  . TOTAL KNEE ARTHROPLASTY Right 09/12/2013   Procedure: RIGHT TOTAL KNEE ARTHROPLASTY;  Surgeon: Gearlean Alf, MD;  [facility];  Service: Orthopedics;  Laterality: Right;  . TUBAL LIGATION      There were  no vitals filed for this visit.  Subjective Assessment - 08/04/19 1653    Subjective  Pt got her lymphapress pump but is not doing well with it. She feels that it is too tight and really causing her pain. She feels that her hand is down, but her arm is puffed up    Pertinent History  left breast cancer with mastectomy 09/04/2009 had 2 nodes removed and then had to have a second surgery for 8 or 9 more removed.  Prior to surgery , she had chemotherapy , after surgery radiation, Then had reconstruction with a latt flap.  She still has tightness in her shoulder. Past history includes DM and bilateral knee replacement    Patient Stated Goals  She wants to get her arm back under control    Currently in Pain?  No/denies            LYMPHEDEMA/ONCOLOGY QUESTIONNAIRE - 08/04/19 1609      Left Upper Extremity Lymphedema   10 cm Proximal to Olecranon Process  37 cm    Olecranon Process  33 cm    15 cm Proximal to Ulnar Styloid Process  34.5 cm    10  cm Proximal to Ulnar Styloid Process  31 cm    Just Proximal to Ulnar Styloid Process  21 cm    Across Hand at [employer]  19.5 cm    At [place] of 2nd Digit  6.5 cm                [facility] Adult PT Treatment/Exercise - 08/04/19 0001      Manual Therapy   Manual Therapy  Edema management;Compression Bandaging    Manual therapy comments  remeasured arm     Edema Management  contacted [facility] rep, Shaune Spittle while pt was her and he called back He will come to see pt in our clinic next Weds at 10:00 for a review of pump and to make adjustments.     Compression Bandaging  Gave pt link for bandagin video on [email or web address] and husband videotaped on their phone phone while I applied elastomull to fingers 1-4, then tg soft, 2 artiflex with thin foam at elbow, 6 cm to hand and wrist, 8 cm in herring bone at forearm the 2 -10 cm bandages from wrist to axilla              PT Education - 08/04/19 1657    Education Details  compression  bandaging for lymphedema managment    Person(s) Educated  Patient;Spouse    Methods  Explanation;Demonstration    Comprehension  Need further instruction       PT Short Term Goals - 07/19/19 1747      PT SHORT TERM GOAL #1   Title  Pt will be independent in self MLD , elevation and remedial exercise    Time  4    Status  Achieved      PT SHORT TERM GOAL #2   Title  Pt will have reduction of left arm circumference at 10 cm proximal to ulnar styolid by 1 cm    Baseline  30.5 on 9/15,, 30.5 on 07/19/2019    Status  On-going        PT Long Term Goals - 07/19/19 1748      PT LONG TERM GOAL #1   Title  Pt will be able to manage her lymphedema at home with use of elevation exercise, compression garments and compression pump    Time  8    Period  Weeks    Status  On-going      PT LONG TERM GOAL #2   Title  Pt will be independent in a HEP for shoulder strength    Time  8    Period  Weeks    Status  On-going      PT LONG TERM GOAL #3   Title  Pt will have decrease in left UE at 10 cm proximal to ulnar styloid by 2 cm    Baseline  30.5 on 9/15, 30.5 on 07/19/2019    Time  8    Period  Weeks    Status  On-going            Plan - 08/04/19 1658    Clinical Impression Statement  Pt continues to struggle with managment of lymphedema.  Husband needs more instruction on bandaging arm and he watched and videotaped technique today.  [facility] pump needs to be adjusted and appt for that is scheduled Pt will continue to use [facility], but needs to have flat knit sleeves measured when she is reduced    Personal Factors and Comorbidities  Comorbidity 3+  Comorbidities  mastectomy with ~ 10 nodes removed ,radiation, DM    Rehab Potential  Good    PT Frequency  2x / week    PT Treatment/Interventions  ADLs/Self Care Home Management;DME Instruction;Therapeutic activities;Therapeutic exercise;Orthotic Fit/Training;Manual techniques;Manual lymph drainage;Passive range of motion;Scar  mobilization;Taping;Compression bandaging    PT Next Visit Plan  Remeasure.  continue with complete decongestive therapy and assit with getting flat knit garments       Patient will benefit from skilled therapeutic intervention in order to improve the following deficits and impairments:  Decreased scar mobility, Increased fascial restricitons, Impaired UE functional use, Increased edema, Obesity, Postural dysfunction  Visit Diagnosis: Postmastectomy lymphedema  Muscle weakness (generalized)  Abnormal posture     Problem List Patient Active Problem List   Diagnosis Date Noted  . Abnormal chest CT 12/27/2018  . Obesity hypoventilation syndrome ([place]) 10/26/2018  . Seasonal allergic conjunctivitis 06/17/2017  . Medicare annual wellness visit, initial 06/08/2014  . Anemia 11/24/2013  . Breast cancer of upper-outer quadrant of left female breast ([place]) 04/07/2013  . S/P TKR (total knee replacement) 03/21/2013  . Sleep disturbance, unspecified 03/21/2013  . Abnormal LFTs 03/21/2013  . OA (osteoarthritis) of knee 12/20/2012  . Degenerative joint disease of knee 11/05/2012  . Diabetes mellitus, type 2 ([place]) 11/05/2012  . Adjustment disorder 03/13/2012  . Neuritis 07/28/2011  . Obstructive sleep apnea 05/28/2009  . Malignant neoplasm of female breast ([place]) 04/07/2009  . RHINITIS, CHRONIC 02/21/2009  . HYPERLIPIDEMIA 02/15/2007  . Essential hypertension 02/15/2007  . Allergic rhinitis 02/15/2007  . GERD 02/15/2007  . OSTEOARTHRITIS 02/15/2007  . Impaired fasting glucose 02/15/2007   Donato Heinz. Owens Shark PT  Norwood Levo 08/04/2019, [place], [place], [address] Phone: [phone]   Fax:  [phone]  Name: Chelsey [name] MRN: [id number] Date of Birth: Aug 12, 1946

## 2019-08-11 ENCOUNTER — Encounter: Payer: Medicare Other | Admitting: Physical Therapy

## 2019-08-11 ENCOUNTER — Encounter: Payer: Self-pay | Admitting: Internal Medicine

## 2019-08-17 ENCOUNTER — Encounter: Payer: Self-pay | Admitting: Physical Therapy

## 2019-08-17 ENCOUNTER — Other Ambulatory Visit: Payer: Self-pay

## 2019-08-17 ENCOUNTER — Ambulatory Visit: Payer: Medicare Other | Attending: Internal Medicine | Admitting: Physical Therapy

## 2019-08-17 DIAGNOSIS — I972 Postmastectomy lymphedema syndrome: Secondary | ICD-10-CM | POA: Insufficient documentation

## 2019-08-17 DIAGNOSIS — M6281 Muscle weakness (generalized): Secondary | ICD-10-CM | POA: Diagnosis not present

## 2019-08-17 DIAGNOSIS — R293 Abnormal posture: Secondary | ICD-10-CM | POA: Diagnosis not present

## 2019-08-17 NOTE — Therapy (Addendum)
[place], [place], [address] Phone: [phone]   Fax:  [phone]  Physical Therapy Treatment  Patient Details  Name: Chelsey [name] MRN: [id number] Date of Birth: Mar 09, 1946 Referring Provider (PT): Dr. Harvie Heck   Encounter Date: 08/17/2019  PT End of Session - 08/17/19 1222    Visit Number  9    Number of Visits  17    Date for PT Re-Evaluation  08/26/19    PT Start Time  1100    PT Stop Time  1155    PT Time Calculation (min)  55 min    Activity Tolerance  Patient tolerated treatment well    Behavior During Therapy  [facility] for tasks assessed/performed       Past Medical History:  Diagnosis Date  . Allergy   . Aphthous ulcer of mouth 2010   related to chemotherapy   . Breast cancer ([place])   . Carpal tunnel syndrome of right wrist   . Colitis   . Colon polyp   . Diabetes mellitus   . Diverticulosis   . GERD (gastroesophageal reflux disease)   . Hyperlipidemia   . Hypertension   . Lymphedema 08-31-13   left arm  . Osteoarthritis   . Pneumonia 07/28/2011  . Sinusitis   . Sleep apnea    setting of 10  . Squamous cell carcinoma    left leg    Past Surgical History:  Procedure Laterality Date  . AUGMENTATION MAMMAPLASTY Left   . CESAREAN SECTION     x 3  . MASTECTOMY Left 11/23   2010  . RECONSTRUCTION BREAST IMMEDIATE / DELAYED W/ TISSUE EXPANDER Left   . REDUCTION MAMMAPLASTY Right   . SKIN GRAFT    . SQUAMOUS CELL CARCINOMA EXCISION Left    Left lower extremity  . TONSILLECTOMY     as child  . TOTAL KNEE ARTHROPLASTY Left 12/20/2012   Procedure: TOTAL KNEE ARTHROPLASTY;  Surgeon: Gearlean Alf, MD;  Location: [facility];  Service: Orthopedics;  Laterality: Left;  . TOTAL KNEE ARTHROPLASTY Right 09/12/2013   Procedure: RIGHT TOTAL KNEE ARTHROPLASTY;  Surgeon: Gearlean Alf, MD;  Location: [facility];  Service: Orthopedics;  Laterality: Right;  . TUBAL LIGATION      There were no  vitals filed for this visit.  Subjective Assessment - 08/17/19 1108    Subjective  Pt reports she had pain with the lymphapress pump and she has tried to decrease the pressue to help control that  Her husband has been wrapping her arm at home and she has been wearing her Maceo Pro, but her arm still swells up during the day    Pertinent History  left breast cancer with mastectomy 09/04/2009 had 2 nodes removed and then had to have a second surgery for 8 or 9 more removed.  Prior to surgery , she had chemotherapy , after surgery radiation, Then had reconstruction with a latt flap.  She still has tightness in her shoulder. Past history includes DM and bilateral knee replacement    Patient Stated Goals  She wants to get her arm back under control    Currently in Pain?  No/denies            LYMPHEDEMA/ONCOLOGY QUESTIONNAIRE - 08/17/19 1136      Left Upper Extremity Lymphedema   15 cm Proximal to Olecranon Process  --    10 cm Proximal to Olecranon Process  36 cm  Olecranon Process  33 cm    15 cm Proximal to Ulnar Styloid Process  34.3 cm    10 cm Proximal to Ulnar Styloid Process  31 cm    Just Proximal to Ulnar Styloid Process  21 cm    Across Hand at [employer]  20.5 cm    At Reader of 2nd Digit  7 cm                [facility] Adult PT Treatment/Exercise - 08/17/19 0001      Manual Therapy   Manual Therapy  Edema management;Compression Bandaging    Manual therapy comments  remeasured arm     Manual Lymphatic Drainage (MLD)  in supine, short neck, superficial and deep abdominals, right axillary nodes, anterior inter-axillary anastamosis,  left inguinal nodes and left axillo-inguinal anastamosis, left shoulder arm, forearm and hand with return along pathways with extra time spent on firm areas of forearm and at medial elbow, then to right sidelying for posterior interaxillary anastamosis and further work along [employer] axillo-inguinal anastomosis    Compression Bandaging  thick  stockinett applied to arm, elastomull applied to finder 2-4 with tape applied for reinforcement. thin foam to back of hand and anterior elbow, peach lined foam to foream and 2 aritiflex, the 4 short stretch bandages from hand to axilla with herringbone at forearm.                PT Short Term Goals - 07/19/19 1747      PT SHORT TERM GOAL #1   Title  Pt will be independent in self MLD , elevation and remedial exercise    Time  4    Status  Achieved      PT SHORT TERM GOAL #2   Title  Pt will have reduction of left arm circumference at 10 cm proximal to ulnar styolid by 1 cm    Baseline  30.5 on 9/15,, 30.5 on 07/19/2019    Status  On-going        PT Long Term Goals - 08/17/19 1225      PT LONG TERM GOAL #1   Title  Pt will be able to manage her lymphedema at home with use of elevation exercise, compression garments and compression pump    Time  8    Period  Weeks    Status  On-going      PT LONG TERM GOAL #2   Title  Pt will be independent in a HEP for shoulder strength    Period  Weeks    Status  On-going      PT LONG TERM GOAL #3   Title  Pt will have decrease in left UE at 10 cm proximal to ulnar styloid by 2 cm    Baseline  30.5 on 9/15, 30.5 on 07/19/2019, 31 cm on 08/17/2019    Time  8    Period  Weeks    Status  On-going            Plan - 08/17/19 1216    Clinical Impression Statement  Patient continues to struggle getting control of the lymphedema of her left arm. She  trialed a 51 "basic" pump in clinic and could not tolerate the recommended pressure on her forearm.  A 52 "advanced" was also trialed and with the increased settings adjustments possible, the patient was able to tolerate the recommended treatment pressures.  For this reason, I'm recommending an upgrade to a 52 "advanced" pump for  this patient.  She does not have a daytime sleeve that fits her as her other garments are too small.  She has been scheduled for meausurement of flat knit sleeve and  glove for 10:00 am on Nov 23 in our office with [employer] rep    Comorbidities  mastectomy with ~ 10 nodes removed ,radiation, DM    Stability/Clinical Decision Making  Stable/Uncomplicated    Rehab Potential  Good    PT Frequency  2x / week    PT Duration  8 weeks    PT Treatment/Interventions  ADLs/Self Care Home Management;DME Instruction;Therapeutic activities;Therapeutic exercise;Orthotic Fit/Training;Manual techniques;Manual lymph drainage;Passive range of motion;Scar mobilization;Taping;Compression bandaging    PT Next Visit Plan  Remeasure.  continue with complete decongestive therapy and assit with getting flat knit garments    Consulted and Agree with Plan of Care  Patient       Patient will benefit from skilled therapeutic intervention in order to improve the following deficits and impairments:  Decreased scar mobility, Increased fascial restricitons, Impaired UE functional use, Increased edema, Obesity, Postural dysfunction  Visit Diagnosis: Postmastectomy lymphedema  Muscle weakness (generalized)  Abnormal posture     Problem List Patient Active Problem List   Diagnosis Date Noted  . Abnormal chest CT 12/27/2018  . Obesity hypoventilation syndrome ([place]) 10/26/2018  . Seasonal allergic conjunctivitis 06/17/2017  . Medicare annual wellness visit, initial 06/08/2014  . Anemia 11/24/2013  . Breast cancer of upper-outer quadrant of left female breast ([place]) 04/07/2013  . S/P TKR (total knee replacement) 03/21/2013  . Sleep disturbance, unspecified 03/21/2013  . Abnormal LFTs 03/21/2013  . OA (osteoarthritis) of knee 12/20/2012  . Degenerative joint disease of knee 11/05/2012  . Diabetes mellitus, type 2 ([place]) 11/05/2012  . Adjustment disorder 03/13/2012  . Neuritis 07/28/2011  . Obstructive sleep apnea 05/28/2009  . Malignant neoplasm of female breast ([place]) 04/07/2009  . RHINITIS, CHRONIC 02/21/2009  . HYPERLIPIDEMIA 02/15/2007  . Essential hypertension 02/15/2007   . Allergic rhinitis 02/15/2007  . GERD 02/15/2007  . OSTEOARTHRITIS 02/15/2007  . Impaired fasting glucose 02/15/2007   Donato Heinz. Owens Shark PT  Norwood Levo 08/17/2019, 12:27 PM  [place], [place], [address] Phone: [phone]   Fax:  [phone]  Name: ZABDI MIS MRN: [id number] Date of Birth: [birth year]  PHYSICAL THERAPY DISCHARGE SUMMARY  Visits from Start of Care: 9  Current functional level related to goals / functional outcomes: unknown  Remaining deficits: unknown   Education / Equipment: Lymphedema management  Plan: Patient agrees to discharge.  Patient goals were partially met. Patient is being discharged due to not returning since the last visit.  ?????    Donato Heinz. Owens Shark, PT

## 2019-08-20 ENCOUNTER — Ambulatory Visit: Payer: Medicare Other

## 2019-08-30 ENCOUNTER — Other Ambulatory Visit: Payer: Self-pay

## 2019-08-30 ENCOUNTER — Ambulatory Visit (INDEPENDENT_AMBULATORY_CARE_PROVIDER_SITE_OTHER): Payer: Medicare Other

## 2019-08-30 DIAGNOSIS — Z23 Encounter for immunization: Secondary | ICD-10-CM | POA: Diagnosis not present

## 2019-09-14 ENCOUNTER — Other Ambulatory Visit: Payer: Self-pay

## 2019-09-14 NOTE — Patient Outreach (Signed)
[facility]) Care Management  09/14/2019  Chelsey [name] [birth year] [id number]   Medication Adherence call to Chelsey [name] Hippa Identifiers Verify spoke with patient she is past due on Rosuvastatin,patient explain she is only taking it every other day per doctor instruction.Chelsey [name] is showing past due under [place].   [place] Management Direct Dial [phone]  Fax [phone] [email or web address]

## 2019-09-20 DIAGNOSIS — I972 Postmastectomy lymphedema syndrome: Secondary | ICD-10-CM | POA: Diagnosis not present

## 2019-10-11 ENCOUNTER — Other Ambulatory Visit: Payer: Self-pay

## 2019-10-11 ENCOUNTER — Ambulatory Visit: Payer: Medicare Other | Admitting: Internal Medicine

## 2019-10-11 ENCOUNTER — Encounter: Payer: Self-pay | Admitting: Internal Medicine

## 2019-10-11 VITALS — BP 116/68 | HR 63 | Temp 97.3°F | Ht 63.0 in | Wt 194.1 lb

## 2019-10-11 DIAGNOSIS — R059 Cough, unspecified: Secondary | ICD-10-CM

## 2019-10-11 DIAGNOSIS — R05 Cough: Secondary | ICD-10-CM | POA: Diagnosis not present

## 2019-10-11 DIAGNOSIS — R918 Other nonspecific abnormal finding of lung field: Secondary | ICD-10-CM

## 2019-10-11 DIAGNOSIS — B349 Viral infection, unspecified: Secondary | ICD-10-CM

## 2019-10-11 DIAGNOSIS — G4733 Obstructive sleep apnea (adult) (pediatric): Secondary | ICD-10-CM | POA: Diagnosis not present

## 2019-10-11 NOTE — Assessment & Plan Note (Signed)
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 Acting stable so far, with next imaging in [name], 2021 at [facility]

## 2019-10-11 NOTE — Assessment & Plan Note (Signed)
Benefits from CPAP confirmed by download with good compliance and control Plan-continue auto 5-15

## 2019-10-11 NOTE — Progress Notes (Signed)
HPI female never smoker here with husband. History OSA/CPAP, complicated by history breast cancer, HBP, allergic rhinitis NPSG 05/06/19 AHI 47.7/hr,   -----------------------------------------------------------------------------------------------------   08/26/2018- 73 year old female never smoker followed for history OSA/CPAP, complicated by history breast cancer, HBP, allergic rhinitis, DM 2  cpap auto 5-15/ [facility] -----pt wearing cpap avg 8hr nightly- would like to try a different mask. waking feeling well rest. DME:[facility] Download 100% compliance AHI 1.6/hour Discussed mask comfort, styles available and options for better fit.  10/11/2019- 73 year old female never smoker followed for history OSA/CPAP, complicated by history breast cancer, HBP, allergic rhinitis, DM 2  cpap auto 5-15/ [facility] -----f/u OSA . breathing is at patient's baseline. CT chest at [facility] for lung nodules/ hx breast cancer>> Report from 03/2019 scanned- stable 5-6 mm nodules bilat, radiation fibrosis. CPAP working well- no concerns. Describes a covid-like illness in Feb, 2020 after attending a large meeting- flu symptoms, cough, myalgias, fever, lost taste and smell. Tested neg flu. Never covid tested and would like to be.   Download compliance 100%, AHI 1.1/ hr Body weight today 194 lbs Trazodone 50,  CXR 12/13/2018-  IMPRESSION: 1. Stable cardiomegaly and chronic interstitial changes. 2.  Aortic Atherosclerosis (ICD10-I70.0).  ROS-see HPI     + = positive Constitutional:   No-   weight loss, night sweats, + fevers, chills, fatigue, lassitude. HEENT:   + headaches, difficulty swallowing, tooth/dental problems, sore throat,       No-  sneezing, +itching, ear ache, +nasal congestion, +post nasal drip,  CV:  No-   chest pain, orthopnea, PND, swelling in lower extremities, anasarca, dizziness, palpitations Resp: No-   shortness of breath with exertion or at rest.            productive cough,  No non-productive cough,   No- coughing up of blood.           change in color of mucus.  No- wheezing.   Skin: No-   rash or lesions. GI:  No-   heartburn, indigestion, abdominal pain, nausea, vomiting,  GU:  MS:  + joint pain or swelling.   Neuro-     nothing unusual Psych:  No- change in mood or affect. No depression or anxiety.  No memory loss.  OBJ- Physical Exam    General- Alert, Oriented, Affect-appropriate, Distress- none acute, + overweight Skin- rash-none, lesions- none, excoriation- none Lymphadenopathy- none Head- atraumatic            Eyes- Gross vision intact, PERRLA, conjunctivae and secretions clear,  + mild proptosis            Ears- Hearing, canals-normal            Nose- + prominent turbinates, Septal dev +mild, no-mucus, polyps, erosion, perforation             Throat- Mallampati III-IV, mucosa clear , drainage- none, tonsils- atrophic, own teeth Neck- flexible , trachea midline, no stridor , thyroid nl, carotid no bruit Chest - symmetrical excursion , unlabored           Heart/CV- RRR , no murmur , no gallop  , no rub, nl s1 s2                           - JVD- none , edema- none, stasis changes- none, varices- none           Lung- clear, wheeze- none, cough- none , dullness-none, rub- none  Chest wall-  Abd-  Br/ Gen/ Rectal- Not done, not indicated Extrem- cyanosis- none, clubbing, none, atrophy- none, strength- nl Neuro- grossly intact to observation

## 2019-10-11 NOTE — Patient Instructions (Signed)
We can continue CPAP auto 5-15, mask of choice, humidifier, supplies, /airview/ card  Order lab-  Covid test     Possible covid infection 10 months ago  Please call if we can help

## 2019-10-11 NOTE — Assessment & Plan Note (Signed)
Complete symptomatic resolution from illness 10 months ago Plan- test for covid antibodies.

## 2019-10-12 LAB — SARS COV-2 SEROLOGY(COVID-19)AB(IGG,IGM),IMMUNOASSAY
SARS CoV-2 AB IgG: NEGATIVE
SARS CoV-2 IgM: NEGATIVE

## 2019-10-13 ENCOUNTER — Telehealth: Payer: Self-pay | Admitting: Internal Medicine

## 2019-10-13 NOTE — Telephone Encounter (Signed)
Spoke with pt. She is wanting her COVID results. These have been given to her. Pt would like to do the antibodies blood test.  CY - please advise. Thanks!

## 2019-10-17 NOTE — Telephone Encounter (Signed)
Spoke with the pt and notified of results per CDY  She verbalized understanding  Nothing further needed

## 2019-10-17 NOTE — Telephone Encounter (Signed)
She did have the SARS Covid antibody test- her IgG and IgM antibodies were negative.

## 2019-10-20 DIAGNOSIS — C50812 Malignant neoplasm of overlapping sites of left female breast: Secondary | ICD-10-CM | POA: Diagnosis not present

## 2019-10-20 DIAGNOSIS — R918 Other nonspecific abnormal finding of lung field: Secondary | ICD-10-CM | POA: Diagnosis not present

## 2019-10-20 DIAGNOSIS — R928 Other abnormal and inconclusive findings on diagnostic imaging of breast: Secondary | ICD-10-CM | POA: Diagnosis not present

## 2019-10-20 DIAGNOSIS — C50912 Malignant neoplasm of unspecified site of left female breast: Secondary | ICD-10-CM | POA: Diagnosis not present

## 2019-10-31 DIAGNOSIS — G4733 Obstructive sleep apnea (adult) (pediatric): Secondary | ICD-10-CM | POA: Diagnosis not present

## 2019-11-03 DIAGNOSIS — H04123 Dry eye syndrome of bilateral lacrimal glands: Secondary | ICD-10-CM | POA: Diagnosis not present

## 2019-11-03 DIAGNOSIS — H40013 Open angle with borderline findings, low risk, bilateral: Secondary | ICD-10-CM | POA: Diagnosis not present

## 2019-11-03 DIAGNOSIS — H43813 Vitreous degeneration, bilateral: Secondary | ICD-10-CM | POA: Diagnosis not present

## 2019-11-03 DIAGNOSIS — Z961 Presence of intraocular lens: Secondary | ICD-10-CM | POA: Diagnosis not present

## 2019-11-03 DIAGNOSIS — H10413 Chronic giant papillary conjunctivitis, bilateral: Secondary | ICD-10-CM | POA: Diagnosis not present

## 2019-11-07 DIAGNOSIS — Z03818 Encounter for observation for suspected exposure to other biological agents ruled out: Secondary | ICD-10-CM | POA: Diagnosis not present

## 2019-11-13 ENCOUNTER — Ambulatory Visit: Payer: Medicare Other

## 2019-11-18 DIAGNOSIS — M818 Other osteoporosis without current pathological fracture: Secondary | ICD-10-CM | POA: Diagnosis not present

## 2019-11-18 DIAGNOSIS — R935 Abnormal findings on diagnostic imaging of other abdominal regions, including retroperitoneum: Secondary | ICD-10-CM | POA: Diagnosis not present

## 2019-11-18 DIAGNOSIS — R918 Other nonspecific abnormal finding of lung field: Secondary | ICD-10-CM | POA: Diagnosis not present

## 2019-11-18 DIAGNOSIS — R9389 Abnormal findings on diagnostic imaging of other specified body structures: Secondary | ICD-10-CM | POA: Diagnosis not present

## 2019-11-18 DIAGNOSIS — N952 Postmenopausal atrophic vaginitis: Secondary | ICD-10-CM | POA: Diagnosis not present

## 2019-11-18 DIAGNOSIS — C50912 Malignant neoplasm of unspecified site of left female breast: Secondary | ICD-10-CM | POA: Diagnosis not present

## 2019-11-18 DIAGNOSIS — Z79899 Other long term (current) drug therapy: Secondary | ICD-10-CM | POA: Diagnosis not present

## 2019-11-18 DIAGNOSIS — Z170 Estrogen receptor positive status [ER+]: Secondary | ICD-10-CM | POA: Diagnosis not present

## 2019-11-18 DIAGNOSIS — M810 Age-related osteoporosis without current pathological fracture: Secondary | ICD-10-CM | POA: Diagnosis not present

## 2019-11-19 ENCOUNTER — Ambulatory Visit: Payer: Medicare Other | Attending: Internal Medicine

## 2019-11-19 DIAGNOSIS — Z23 Encounter for immunization: Secondary | ICD-10-CM | POA: Insufficient documentation

## 2019-11-19 NOTE — Progress Notes (Signed)
   Covid-19 Vaccination Clinic  Name:  Chelsey [name]    MRN: [id number] DOB: [birth year]  11/19/2019  [name] was observed post Covid-19 immunization for 15 minutes without incidence. She was provided with Vaccine Information Sheet and instruction to access the V-Safe system.   [name] was instructed to call 911 with any severe reactions post vaccine: Marland Kitchen Difficulty breathing  . Swelling of your face and throat  . A fast heartbeat  . A bad rash all over your body  . Dizziness and weakness    Immunizations Administered    Name Date Dose VIS Date Route   Pfizer COVID-19 Vaccine 11/19/2019  8:35 AM 0.3 mL 09/23/2019 Intramuscular   Manufacturer: [place]   Lot: [id number]   [place]: [id number]

## 2019-11-22 DIAGNOSIS — Z9889 Other specified postprocedural states: Secondary | ICD-10-CM | POA: Diagnosis not present

## 2019-11-23 ENCOUNTER — Other Ambulatory Visit: Payer: Self-pay | Admitting: Internal Medicine

## 2019-11-24 ENCOUNTER — Ambulatory Visit: Payer: Medicare Other

## 2019-12-12 DIAGNOSIS — H40013 Open angle with borderline findings, low risk, bilateral: Secondary | ICD-10-CM | POA: Diagnosis not present

## 2019-12-12 DIAGNOSIS — H04123 Dry eye syndrome of bilateral lacrimal glands: Secondary | ICD-10-CM | POA: Diagnosis not present

## 2019-12-12 DIAGNOSIS — H10413 Chronic giant papillary conjunctivitis, bilateral: Secondary | ICD-10-CM | POA: Diagnosis not present

## 2019-12-13 ENCOUNTER — Ambulatory Visit: Payer: Medicare Other | Attending: Internal Medicine

## 2019-12-13 ENCOUNTER — Ambulatory Visit: Payer: Self-pay

## 2019-12-13 DIAGNOSIS — Z23 Encounter for immunization: Secondary | ICD-10-CM | POA: Insufficient documentation

## 2019-12-13 NOTE — Progress Notes (Signed)
   Covid-19 Vaccination Clinic  Name:  Chelsey [name]    MRN: [id number] DOB: [birth year]  12/13/2019  [name] was observed post Covid-19 immunization for 15 minutes without incident. She was provided with Vaccine Information Sheet and instruction to access the V-Safe system.   [name] was instructed to call 911 with any severe reactions post vaccine: Marland Kitchen Difficulty breathing  . Swelling of face and throat  . A fast heartbeat  . A bad rash all over body  . Dizziness and weakness   Immunizations Administered    Name Date Dose VIS Date Route   Pfizer COVID-19 Vaccine 12/13/2019  3:24 PM 0.3 mL 09/23/2019 Intramuscular   Manufacturer: [place]   Lot: [id number]   [place]: [id number]

## 2020-01-05 ENCOUNTER — Other Ambulatory Visit: Payer: Self-pay | Admitting: Internal Medicine

## 2020-01-05 NOTE — Telephone Encounter (Signed)
Patient need to schedule an ov for more refills. Lm for pt to schedule appt. For refills.

## 2020-01-09 ENCOUNTER — Other Ambulatory Visit: Payer: Self-pay | Admitting: Internal Medicine

## 2020-01-09 MED ORDER — LOSARTAN POTASSIUM 50 MG PO TABS: 50.0000 mg | ORAL_TABLET | Freq: Every day | ORAL | 0 refills | Status: DC

## 2020-01-09 NOTE — Addendum Note (Signed)
Addended by: Agnes Lawrence on: 01/09/2020 01:50 PM   Modules accepted: Orders

## 2020-01-09 NOTE — Telephone Encounter (Signed)
Pt is needing a refill on Losartan but was told to schedule an appt. She is scheduled for April 9th at 11am but needs it refilled before she can be seen.   Medication Refill: Losartan   Pharmacy: [facility] [address] FAX: [phone]

## 2020-01-09 NOTE — Telephone Encounter (Signed)
Rx done. 

## 2020-01-20 ENCOUNTER — Other Ambulatory Visit: Payer: Self-pay

## 2020-01-20 ENCOUNTER — Ambulatory Visit (INDEPENDENT_AMBULATORY_CARE_PROVIDER_SITE_OTHER): Payer: Medicare Other | Admitting: Internal Medicine

## 2020-01-20 ENCOUNTER — Encounter: Payer: Self-pay | Admitting: Internal Medicine

## 2020-01-20 VITALS — BP 112/64 | HR 68 | Temp 97.7°F | Ht 63.0 in | Wt 191.6 lb

## 2020-01-20 DIAGNOSIS — E785 Hyperlipidemia, unspecified: Secondary | ICD-10-CM | POA: Diagnosis not present

## 2020-01-20 DIAGNOSIS — E119 Type 2 diabetes mellitus without complications: Secondary | ICD-10-CM | POA: Diagnosis not present

## 2020-01-20 DIAGNOSIS — Z79899 Other long term (current) drug therapy: Secondary | ICD-10-CM

## 2020-01-20 DIAGNOSIS — I10 Essential (primary) hypertension: Secondary | ICD-10-CM | POA: Diagnosis not present

## 2020-01-20 DIAGNOSIS — I890 Lymphedema, not elsewhere classified: Secondary | ICD-10-CM

## 2020-01-20 DIAGNOSIS — Z853 Personal history of malignant neoplasm of breast: Secondary | ICD-10-CM

## 2020-01-20 LAB — MICROALBUMIN / CREATININE URINE RATIO
Creatinine,U: 45.5 mg/dL
Microalb Creat Ratio: 1.5 mg/g (ref 0.0–30.0)
Microalb, Ur: <0.7 mg/dL (ref 0.0–1.9)

## 2020-01-20 LAB — HEPATIC FUNCTION PANEL
ALT: 19 U/L (ref 0–35)
AST: 17 U/L (ref 0–37)
Albumin: 4.7 g/dL (ref 3.5–5.2)
Alkaline Phosphatase: 45 U/L (ref 39–117)
Bilirubin, Direct: 0.1 mg/dL (ref 0.0–0.3)
Total Bilirubin: 0.6 mg/dL (ref 0.2–1.2)
Total Protein: 6.8 g/dL (ref 6.0–8.3)

## 2020-01-20 LAB — CBC WITH DIFFERENTIAL/PLATELET
Basophils Absolute: 0 10*3/uL (ref 0.0–0.1)
Basophils Relative: 0.7 % (ref 0.0–3.0)
Eosinophils Absolute: 0.1 10*3/uL (ref 0.0–0.7)
Eosinophils Relative: 2.2 % (ref 0.0–5.0)
HCT: 38.4 % (ref 36.0–46.0)
Hemoglobin: 13.4 g/dL (ref 12.0–15.0)
Lymphocytes Relative: 35.4 % (ref 12.0–46.0)
Lymphs Abs: 2 10*3/uL (ref 0.7–4.0)
MCHC: 35 g/dL (ref 30.0–36.0)
MCV: 91.1 fl (ref 78.0–100.0)
Monocytes Absolute: 0.5 10*3/uL (ref 0.1–1.0)
Monocytes Relative: 8.7 % (ref 3.0–12.0)
Neutro Abs: 3 10*3/uL (ref 1.4–7.7)
Neutrophils Relative %: 53 % (ref 43.0–77.0)
Platelets: 185 10*3/uL (ref 150.0–400.0)
RBC: 4.22 Mil/uL (ref 3.87–5.11)
RDW: 13.4 % (ref 11.5–15.5)
WBC: 5.6 10*3/uL (ref 4.0–10.5)

## 2020-01-20 LAB — TSH: TSH: 1.54 u[IU]/mL (ref 0.35–4.50)

## 2020-01-20 LAB — LIPID PANEL
Cholesterol: 189 mg/dL (ref 0–200)
HDL: 63.8 mg/dL (ref >39.00)
LDL Cholesterol: 89 mg/dL (ref 0–99)
NonHDL: 124.86
Total CHOL/HDL Ratio: 3
Triglycerides: 178 mg/dL — ABNORMAL HIGH (ref 0.0–149.0)
VLDL: 35.6 mg/dL (ref 0.0–40.0)

## 2020-01-20 LAB — BASIC METABOLIC PANEL
BUN: 13 mg/dL (ref 6–23)
CO2: 29 mEq/L (ref 19–32)
Calcium: 10.4 mg/dL (ref 8.4–10.5)
Chloride: 102 mEq/L (ref 96–112)
Creatinine, Ser: 0.7 mg/dL (ref 0.40–1.20)
GFR: 82.08 mL/min (ref >60.00)
Glucose, Bld: 158 mg/dL — ABNORMAL HIGH (ref 70–99)
Potassium: 4.5 mEq/L (ref 3.5–5.1)
Sodium: 141 mEq/L (ref 135–145)

## 2020-01-20 LAB — HEMOGLOBIN A1C: Hgb A1c MFr Bld: 7.2 % — ABNORMAL HIGH (ref 4.6–6.5)

## 2020-01-20 MED ORDER — LOSARTAN POTASSIUM 50 MG PO TABS: ORAL_TABLET | ORAL | 3 refills | Status: DC

## 2020-01-20 NOTE — Patient Instructions (Signed)
Will notify you  of labs when available.   Plan ROV in 6 months or as needed.

## 2020-01-20 NOTE — Progress Notes (Signed)
This visit occurred during the SARS-CoV-2 public health emergency.  Safety protocols were in place, including screening questions prior to the visit, additional usage of staff PPE, and extensive cleaning of exam room while observing appropriate contact time as indicated for disinfecting solutions.    Chief Complaint  Patient presents with  . Medication Refill    Doing okay    HPI: Chelsey [name] 74 y.o. come in for Chronic disease management .   DM no checking but doing fine had vaccine and out working   Chelsey [name]  Stable seeing dr Annamaria Boots   Breast cancer  Left lymphedema edema most problematic   BP has beeen ok   Has had all  specialist .    Visits .   ocass use alprazolam No cp sob  ROS: See pertin Had covid vaccine  t positives and negatives per HPI. No new numbness weakness vision changes   Past Medical History:  Diagnosis Date  . Allergy   . Aphthous ulcer of mouth 2010   related to chemotherapy   . Breast cancer ([place])   . Carpal tunnel syndrome of right wrist   . Colitis   . Colon polyp   . Diabetes mellitus   . Diverticulosis   . GERD (gastroesophageal reflux disease)   . Hyperlipidemia   . Hypertension   . Lymphedema 08-31-13   left arm  . Osteoarthritis   . Pneumonia 07/28/2011  . Sinusitis   . Sleep apnea    setting of 10  . Squamous cell carcinoma    left leg    Family History  Problem Relation Age of Onset  . Alzheimer's disease Mother   . Sleep apnea Mother   . Diabetes Mother   . Hypertension Mother   . Hyperlipidemia Mother   . Breast cancer Mother 21       triple negative  . Anxiety disorder Daughter   . Hypertension Son        pulmonary  . Congenital heart disease Son 0       TGA  died age 50     Social History   Socioeconomic History  . Marital status: Married    Spouse name: Not on file  . Number of children: 3  . Years of education: Not on file  . Highest education level: Not on file  Occupational History  . Occupation:  Architectural technologist: [employer]  . Occupation: Architectural technologist: [employer]  Tobacco Use  . Smoking status: Never Smoker  . Smokeless tobacco: Never Used  Substance and Sexual Activity  . Alcohol use: Yes    Alcohol/week: 6.0 standard drinks    Types: 6 Glasses of wine per week    Comment: 1-2 glasses every other night  . Drug use: No  . Sexual activity: Yes    Birth control/protection: Post-menopausal, Surgical  Other Topics Concern  . Not on file  Social History Narrative   cb x 3   Realtor married    Bereaved  Parent   2 adult children   Mother with dementia and breast cancer   Passed away sept 9 15  Age 57   Social Determinants of Radio broadcast assistant Strain:   . Difficulty of Paying Living Expenses:   Food Insecurity:   . Worried About Charity fundraiser in the Last Year:   . [place] in the Last Year:   Transportation Needs:   . Lack of  Transportation (Medical):   Marland Kitchen Lack of Transportation (Non-Medical):   Physical Activity:   . Days of Exercise per Week:   . Minutes of Exercise per Session:   Stress:   . Feeling of Stress :   Social Connections:   . Frequency of Communication with Friends and Family:   . Frequency of Social Gatherings with Friends and Family:   . Attends Religious Services:   . Active Member of Clubs or Organizations:   . Attends Archivist Meetings:   Marland Kitchen Marital Status:     Outpatient Medications Prior to Visit  Medication Sig Dispense Refill  . ALPRAZolam (XANAX) 0.25 MG tablet Take 1 tablet (0.25 mg total) by mouth 2 (two) times daily as needed for anxiety. Avoid regular use. 30 tablet 0  . anastrozole (ARIMIDEX) 1 MG tablet Take 1 tablet (1 mg total) by mouth daily. 90 tablet 5  . azelastine (ASTELIN) 0.1 % nasal spray INSTILL 1 TO 2 SPRAYS INTO EACH NOSTRIL TWICE DAILY AS NEEDED 30 mL 5  . clindamycin (CLEOCIN) 150 MG capsule TK 4 CS PO 1 HOUR PRIOR TO DENTAL APPOINTMENT    . denosumab (PROLIA) 60  MG/ML SOLN injection Inject 60 mg into the skin every 6 (six) months. Administer in upper arm, thigh, or abdomen    . esomeprazole (NEXIUM) 40 MG capsule Take 40 mg by mouth as needed. Takes bedtime    . metFORMIN (GLUCOPHAGE-XR) 500 MG 24 hr tablet TAKE 2 TABLETS BY MOUTH TWICE DAILY WITH MEALS 360 tablet 0  . Olopatadine HCl 0.2 % SOLN [place] daily    . rosuvastatin (CRESTOR) 10 MG tablet TAKE 1 TABLET BY MOUTH EVERY DAY 90 tablet 0  . sertraline (ZOLOFT) 25 MG tablet TAKE 1 TABLET BY MOUTH EVERY DAY 90 tablet 0  . traZODone (DESYREL) 50 MG tablet TAKE 1/2 TO 1 TABLET BY MOUTH AT BEDTIME AS NEEDED FOR SLEEP 30 tablet 6  . losartan (COZAAR) 50 MG tablet TAKE 1 TABLET(50 MG) BY MOUTH DAILY 90 tablet 0  . methocarbamol (ROBAXIN) 500 MG tablet Take 1 tablet (500 mg total) by mouth every 8 (eight) hours as needed for muscle spasms. (Patient not taking: Reported on 01/20/2020) 30 tablet 0   No facility-administered medications prior to visit.     EXAM:  BP 112/64   Pulse 68   Temp 97.7 F (36.5 C) (Temporal)   Ht 5\' 3"  (1.6 m)   Wt 191 lb 9.6 oz (86.9 kg)   SpO2 98%   BMI 33.94 kg/m   Body mass index is 33.94 kg/m.  GENERAL: vitals reviewed and listed above, alert, oriented, appears well hydrated and in no acute distress HEENT: atraumatic, conjunctiva  clear, no obvious abnormalities on inspection of external nose and ears OP : masked  NECK: no obvious masses on inspection palpation  LUNGS: clear to auscultation bilaterally, no wheezes, rales or rhonchi, good air movement CV: HRRR, no clubbing cyanosis or  peripheral edema nl cap refill  MS: moves all extremities without noticeable focal  Abnormality x left arm with llymphedema  Much larger than  right  PSYCH: pleasant and cooperative, no obvious depression or anxiety .simmple  Lab Results  Component Value Date   WBC 5.6 01/20/2020   HGB 13.4 01/20/2020   HCT 38.4 01/20/2020   PLT 185.0 01/20/2020   GLUCOSE 158 (H) 01/20/2020    CHOL 189 01/20/2020   TRIG 178.0 (H) 01/20/2020   HDL 63.80 01/20/2020   LDLDIRECT 173.9 11/09/2013  LDLCALC 89 01/20/2020   ALT 19 01/20/2020   AST 17 01/20/2020   NA 141 01/20/2020   K 4.5 01/20/2020   CL 102 01/20/2020   CREATININE 0.70 01/20/2020   BUN 13 01/20/2020   CO2 29 01/20/2020   TSH 1.54 01/20/2020   INR 0.93 08/31/2013   HGBA1C 7.2 (H) 01/20/2020   MICROALBUR <0.7 01/20/2020   BP Readings from Last 3 Encounters:  01/20/20 112/64  10/11/19 116/68  12/27/18 124/68   Wt Readings from Last 3 Encounters:  01/20/20 191 lb 9.6 oz (86.9 kg)  10/11/19 194 lb 1.6 oz (88 kg)  12/27/18 186 lb 12.8 oz (84.7 kg)   Lab not  fasting had snack   About a hour ago   ASSESSMENT AND PLAN:  Discussed the following assessment and plan:  Type 2 diabetes mellitus without complication, without long-term current use of insulin ([place]) - Plan: Basic metabolic panel, CBC with Differential/Platelet, Hemoglobin A1c, Hepatic function panel, Lipid panel, TSH, Microalbumin / creatinine urine ratio  Medication management - Plan: Basic metabolic panel, CBC with Differential/Platelet, Hemoglobin A1c, Hepatic function panel, Lipid panel, TSH, Microalbumin / creatinine urine ratio  Essential hypertension - Plan: Basic metabolic panel, CBC with Differential/Platelet, Hemoglobin A1c, Hepatic function panel, Lipid panel, TSH, Microalbumin / creatinine urine ratio  Hyperlipidemia, unspecified hyperlipidemia type - Plan: Basic metabolic panel, CBC with Differential/Platelet, Hemoglobin A1c, Hepatic function panel, Lipid panel, TSH, Microalbumin / creatinine urine ratio  Lymphedema of left arm  HX: breast cancer Taking Sertraline and crestor every other day  Losartan  Refill   -Patient advised to return or notify health care team  if  new concerns arise. 30 minutes  Review time counsel and planning  Patient Instructions  Will notify you  of labs when available.   Plan ROV in 6 months or as  needed.     Standley Brooking. Astaria Nanez M.D.

## 2020-01-30 NOTE — Progress Notes (Signed)
Lab ok except the a1c is back  up from last time  to 7.2 while acceptable  best tot owrk on getting it lower  plan rov in 4-6 months to check a1c  in office

## 2020-02-02 DIAGNOSIS — G4733 Obstructive sleep apnea (adult) (pediatric): Secondary | ICD-10-CM | POA: Diagnosis not present

## 2020-02-04 ENCOUNTER — Other Ambulatory Visit: Payer: Self-pay | Admitting: Internal Medicine

## 2020-02-23 ENCOUNTER — Telehealth: Payer: Self-pay | Admitting: Internal Medicine

## 2020-02-23 NOTE — Chronic Care Management (AMB) (Signed)
°  Chronic Care Management   Note  02/23/2020 Name: Chelsey [name] MRN: [id number] DOB: [birth year]  Chelsey [name] is a 74 y.o. year old female who is a primary care patient of Panosh, Standley Brooking, MD. I reached out to Adele Barthel Basich by phone today in response to a referral sent by Chelsey [name]'s PCP, Panosh, Standley Brooking, MD.   Ms. Faustino was given information about Chronic Care Management services today including:  1. CCM service includes personalized support from designated clinical staff supervised by her physician, including individualized plan of care and coordination with other care providers 2. 24/7 contact phone numbers for assistance for urgent and routine care needs. 3. Service will only be billed when office clinical staff spend 20 minutes or more in a month to coordinate care. 4. Only one practitioner may furnish and bill the service in a calendar month. 5. The patient may stop CCM services at any time (effective at the end of the month) by phone call to the office staff.   Patient agreed to services and verbal consent obtained.   Follow up plan:   [place]

## 2020-03-17 ENCOUNTER — Other Ambulatory Visit: Payer: Self-pay | Admitting: Internal Medicine

## 2020-03-21 DIAGNOSIS — L814 Other melanin hyperpigmentation: Secondary | ICD-10-CM | POA: Diagnosis not present

## 2020-03-21 DIAGNOSIS — L821 Other seborrheic keratosis: Secondary | ICD-10-CM | POA: Diagnosis not present

## 2020-03-21 DIAGNOSIS — D1801 Hemangioma of skin and subcutaneous tissue: Secondary | ICD-10-CM | POA: Diagnosis not present

## 2020-03-21 DIAGNOSIS — Z85828 Personal history of other malignant neoplasm of skin: Secondary | ICD-10-CM | POA: Diagnosis not present

## 2020-03-21 DIAGNOSIS — L720 Epidermal cyst: Secondary | ICD-10-CM | POA: Diagnosis not present

## 2020-03-26 NOTE — Chronic Care Management (AMB) (Deleted)
Chronic Care Management Pharmacy  Name: Chelsey [name]  MRN: [id number] DOB: 05-23-1946    Chief Complaint/ HPI  Chelsey [name],  74 y.o. , female presents for their {Initial/Follow-up:3041532} CCM visit with the clinical pharmacist {CHL HP Upstream Pharm visit BPZW:[id number]}.  PCP : Burnis Medin, MD  Their chronic conditions include: {CHL AMB CHRONIC MEDICAL CONDITIONS:[phone]}  *** 5/13  Office Visits: 01/20/20 OV - labs ok, A1c went up to 7.2% from 6.7% (08/03/19). No changes with medications  Consult Visit:***  Medications: Outpatient Encounter Medications as of 03/28/2020  Medication Sig  . ALPRAZolam (XANAX) 0.25 MG tablet Take 1 tablet (0.25 mg total) by mouth 2 (two) times daily as needed for anxiety. Avoid regular use.  Marland Kitchen anastrozole (ARIMIDEX) 1 MG tablet Take 1 tablet (1 mg total) by mouth daily.  Marland Kitchen azelastine (ASTELIN) 0.1 % nasal spray INSTILL 1 TO 2 SPRAYS INTO EACH NOSTRIL TWICE DAILY AS NEEDED  . clindamycin (CLEOCIN) 150 MG capsule TK 4 CS PO 1 HOUR PRIOR TO DENTAL APPOINTMENT  . denosumab (PROLIA) 60 MG/ML SOLN injection Inject 60 mg into the skin every 6 (six) months. Administer in upper arm, thigh, or abdomen  . esomeprazole (NEXIUM) 40 MG capsule Take 40 mg by mouth as needed. Takes bedtime  . losartan (COZAAR) 50 MG tablet TAKE 1 TABLET(50 MG) BY MOUTH DAILY  . metFORMIN (GLUCOPHAGE-XR) 500 MG 24 hr tablet TAKE 2 TABLETS BY MOUTH TWICE DAILY WITH MEALS  . methocarbamol (ROBAXIN) 500 MG tablet Take 1 tablet (500 mg total) by mouth every 8 (eight) hours as needed for muscle spasms. (Patient not taking: Reported on 01/20/2020)  . Olopatadine HCl 0.2 % SOLN [place] daily  . rosuvastatin (CRESTOR) 10 MG tablet TAKE 1 TABLET BY MOUTH EVERY DAY  . sertraline (ZOLOFT) 25 MG tablet TAKE 1 TABLET BY MOUTH EVERY DAY  . traZODone (DESYREL) 50 MG tablet TAKE 1/2 TO 1 TABLET BY MOUTH AT BEDTIME AS NEEDED FOR SLEEP   No facility-administered encounter medications on file as  of 03/28/2020.     Current Diagnosis/Assessment:  Goals Addressed   None     {CHL HP Upstream Pharmacy Diagnosis/[phone]}   Hypertension   BP today is:  {CHL HP UPSTREAM Pharmacist BP ranges:[phone]}  Office blood pressures are  BP Readings from Last 3 Encounters:  01/20/20 112/64  10/11/19 116/68  12/27/18 124/68    Patient has failed these meds in the past: lisinopril Patient is currently {CHL Controlled/Uncontrolled:[phone]} on the following medications:  . Losartan 50 mg 1 tablet daily  Patient checks BP at home {CHL HP BP Monitoring Frequency:[phone]}  Patient home BP readings are ranging: ***  We discussed {CHL HP Upstream Pharmacy [phone]}  Plan  Continue {CHL HP Upstream Pharmacy Plans:[phone]}     Hyperlipidemia   Lipid Panel     Component Value Date/Time   CHOL 189 01/20/2020 1205   TRIG 178.0 (H) 01/20/2020 1205   TRIG 71 09/15/2006 1055   HDL 63.80 01/20/2020 1205   CHOLHDL 3 01/20/2020 1205   VLDL 35.6 01/20/2020 1205   LDLCALC 89 01/20/2020 1205   LDLDIRECT 173.9 11/09/2013 0915     The 10-year ASCVD risk score Mikey Bussing DC Jr., et al., 2013) is: 21.5%   Values used to calculate the score:     Age: 43 years     Sex: Female     Is Non-Hispanic African American: No     Diabetic: Yes     Tobacco smoker: No  Systolic Blood Pressure: 517 mmHg     Is BP treated: Yes     HDL Cholesterol: 63.8 mg/dL     Total Cholesterol: 189 mg/dL   Patient has failed these meds in past: atorvastatin Patient is currently {CHL Controlled/[phone]} on the following medications:  . Rosuvastatin 10 mg 1 tablet daily  We discussed:  {CHL HP Upstream Pharmacy [phone]}  Plan  Continue {CHL HP Upstream Pharmacy Plans:[phone]}   Diabetes   Recent Relevant Labs: Lab Results  Component Value Date/Time   HGBA1C 7.2 (H) 01/20/2020 12:05 PM   HGBA1C 6.7 (A) 08/03/2019 09:35 AM   HGBA1C  6.7 08/03/2019 09:35 AM   HGBA1C 0.0 (A) 08/03/2019 09:35 AM   HGBA1C 0.0 08/03/2019 09:35 AM   HGBA1C 7.2 (H) 09/13/2018 10:12 AM   GFR 82.08 01/20/2020 12:05 PM   GFR 87.57 09/13/2018 10:12 AM   MICROALBUR <0.7 01/20/2020 12:05 PM   MICROALBUR <0.7 09/13/2018 10:12 AM     Checking BG: {CHL HP Blood Glucose Monitoring Frequency:[phone]}  Recent FBG Readings: *** Recent pre-meal BG readings: *** Recent 2hr PP BG readings:  *** Recent HS BG readings: ***  Patient has failed these meds in past: none Patient is currently {CHL Controlled/[phone]} on the following medications:  Marland Kitchen Metformin XR 500 mg 2 tablets twice daily with meals  Last diabetic Eye exam:  Lab Results  Component Value Date/Time   HMDIABEYEEXA No Retinopathy 06/04/2018 12:00 AM    Last diabetic Foot exam: No results found for: HMDIABFOOTEX   We discussed: {CHL HP Upstream Pharmacy [phone]}  Plan  Continue {CHL HP Upstream Pharmacy Plans:[phone]}    Osteoporosis due to aromatase inhibitor   Last DEXA Scan: ***   T-Score femoral neck: ***  T-Score total hip: ***  T-Score lumbar spine: ***  T-Score forearm radius: ***  10-year probability of major osteoporotic fracture: ***  10-year probability of hip fracture: ***  VITD  Date Value Ref Range Status  02/11/2018 39.23 30.00 - 100.00 ng/mL Final     Patient has failed these meds in past: none  Patient is currently {CHL Controlled/[phone]} on the following medications:   Prolia 60 mg/ml inject 60 mg into the skin every 6 months  We discussed:  {Osteoporosis Counseling:23892}  Plan  Continue {CHL HP Upstream Pharmacy OHYWV:[id number]}  Anxiety   Patient has failed these meds in past: zolpidem Patient is currently {CHL Controlled/[phone]} on the following medications:  . Sertraline 25 mg 1 tablet daily . Trazodone 50 mg 1 tablet daily at bedtime as needed for sleep . Alprazolam  0.25 mg 1 tablet twice daily as needed  We discussed:  ***  Plan  Continue {CHL HP Upstream Pharmacy Plans:[phone]}    Hx of Breast Cancer   Patient has failed these meds in past: *** Patient is currently {CHL Controlled/[phone]} on the following medications:  . Anastrozole 1 mg 1 tablet  daily  We discussed:  ***  Plan  Continue {CHL HP Upstream Pharmacy Plans:[phone]}    Osteoarthritis   Patient has failed these meds in past: none Patient is currently {CHL Controlled/[phone]} on the following medications:  Marland Kitchen Methocarbamol 500 mg 1 tablet every 8 hrs as needed  We discussed:  ***  Plan  Continue {CHL HP Upstream Pharmacy NIOEV:[id number]}    GERD   Patient has failed these meds in past: *** Patient is currently {CHL Controlled/[phone]} on the following medications:  . Esomeprazole 40 mg 1 capsule at bedtime as needed  We discussed:  ***  Plan  Continue {CHL HP Upstream Pharmacy [phone]}   Allergic Rhinitis   Patient has failed these meds in past: *** Patient is currently {CHL Controlled/Uncontrolled:[phone]} on the following medications:  . Olopatadine 0.2% *** sig daily . Azelastine 0.1% nasal spray 1-2 sprays into each nostril twice daily as needed  We discussed:  ***  Plan  Continue {CHL HP Upstream Pharmacy YIRSW:[id number]}   OTCs/Health Maintenance   Patient is currently {CHL Controlled/Uncontrolled:[phone]} on the following medications: . Clindamaycin 150 mg 1 capsule 1 hr prior to dental appointment  We discussed:  ***  Plan  Continue {CHL HP Upstream Pharmacy XFGHW:[id number]}   Vaccines   Reviewed and discussed patient's vaccination history.    Immunization History  Administered Date(s) Administered  . Fluad Quad(high Dose 65+) 08/30/2019  . Influenza, High Dose Seasonal PF 07/02/2015, 07/24/2016, 08/04/2017, 08/26/2018  . Influenza,inj,Quad PF,6+ Mos  06/23/2013  . PFIZER SARS-COV-2 Vaccination 11/19/2019, 12/13/2019  . Pneumococcal Conjugate-13 11/24/2013  . Td 10/03/2010    Plan  Recommended patient receive *** vaccine in *** office/pharmacy.   Medication Management   Pharmacy/Benefits: Adherence:  Pt endorses ***% compliance  We discussed: ***  Plan  {US Pharmacy ELFY:10175}   Follow up: *** month phone visit  Geraldine Contras, PharmD Clinical Pharmacist [facility] [place] [phone]

## 2020-03-28 ENCOUNTER — Telehealth: Payer: Medicare Other

## 2020-03-30 ENCOUNTER — Ambulatory Visit: Payer: Medicare Other | Admitting: Pharmacist

## 2020-03-30 ENCOUNTER — Other Ambulatory Visit: Payer: Self-pay

## 2020-03-30 DIAGNOSIS — I10 Essential (primary) hypertension: Secondary | ICD-10-CM

## 2020-03-30 DIAGNOSIS — E119 Type 2 diabetes mellitus without complications: Secondary | ICD-10-CM

## 2020-03-30 DIAGNOSIS — E785 Hyperlipidemia, unspecified: Secondary | ICD-10-CM

## 2020-03-30 NOTE — Patient Instructions (Addendum)
Visit Information  Thank you for meeting with me to discuss your medications! I look forward to working with you to achieve your health care goals. Below is a summary of what we talked about during the visit:  Goals Addressed            This Visit's Progress   . Chronic Care Management       CARE PLAN ENTRY  Current Barriers:  . Chronic Disease Management support, education, and care coordination needs related to Hypertension, Hyperlipidemia, and Diabetes   Hypertension . Pharmacist Clinical Goal(s): o Over the next 180 days, patient will work with PharmD and providers to maintain BP goal <130/80 . Current regimen:  o Losartan 50 mg 1 tablet daily . Interventions: o Discussed diet and exercise extensively . Patient self care activities - Over the next 180 days, patient will: o Ensure daily salt intake < 2300 mg/day o Start to track weight loss  Hyperlipidemia . Pharmacist Clinical Goal(s): o Over the next 180 days, patient will work with PharmD and providers to achieve LDL goal < 100 . Current regimen:  o Rosuvastatin 10 mg 1 tablet daily . Interventions: o Discussed weight loss and diet . Patient self care activities - Over the next 180 days, patient will: o Start diet and exercise to achieve weight loss  Diabetes . Pharmacist Clinical Goal(s): o Over the next 180 days, patient will work with PharmD and providers to achieve A1c goal <7% . Current regimen:  o Metformin XR 500 mg 2 tablets twice daily with meals . Interventions: o Discussed diet and weight loss extensively . Patient self care activities - Over the next 180 days, patient will: o Start diet and exercise to achieve weight loss o Contact provider with any episodes of hypoglycemia  Medication management . Pharmacist Clinical Goal(s): o Over the next 180 days, patient will work with PharmD and providers to maintain optimal medication adherence . Current pharmacy: [facility] . Interventions o Comprehensive  medication review performed. o Continue current medication management strategy . Patient self care activities - Over the next 180 days, patient will: o Focus on medication adherence by pill boxes o Take medications as prescribed o Report any questions or concerns to PharmD and/or provider(s)  Initial goal documentation        Chelsey [name] was given information about Chronic Care Management services today including:  1. CCM service includes personalized support from designated clinical staff supervised by her physician, including individualized plan of care and coordination with other care providers 2. 24/7 contact phone numbers for assistance for urgent and routine care needs. 3. Standard insurance, coinsurance, copays and deductibles apply for chronic care management only during months in which we provide at least 20 minutes of these services. Most insurances cover these services at 100%, however patients may be responsible for any copay, coinsurance and/or deductible if applicable. This service may help you avoid the need for more expensive face-to-face services. 4. Only one practitioner may furnish and bill the service in a calendar month. 5. The patient may stop CCM services at any time (effective at the end of the month) by phone call to the office staff.  Patient agreed to services and verbal consent obtained.   The patient verbalized understanding of instructions provided today and agreed to receive a mailed copy of patient instruction and/or educational materials. Telephone follow up appointment with pharmacy team member scheduled for: 09/24/20 at 1 PM   Geraldine Contras, PharmD Clinical Pharmacist [facility]  [place] [phone]        , Exercising to Lose Weight Exercise is structured, repetitive physical activity to improve fitness and health. Getting regular exercise is important for everyone. It is especially important if you are overweight. Being  overweight increases your risk of heart disease, stroke, diabetes, high blood pressure, and several types of cancer. Reducing your calorie intake and exercising can help you lose weight. Exercise is usually categorized as moderate or vigorous intensity. To lose weight, most people need to do a certain amount of moderate-intensity or vigorous-intensity exercise each week. Moderate-intensity exercise  Moderate-intensity exercise is any activity that gets you moving enough to burn at least three times more energy (calories) than if you were sitting. Examples of moderate exercise include:  Walking a mile in 15 minutes.  Doing light yard work.  Biking at an easy pace. Most people should get at least 150 minutes (2 hours and 30 minutes) a week of moderate-intensity exercise to maintain their body weight. Vigorous-intensity exercise Vigorous-intensity exercise is any activity that gets you moving enough to burn at least six times more calories than if you were sitting. When you exercise at this intensity, you should be working hard enough that you are not able to carry on a conversation. Examples of vigorous exercise include:  Running.  Playing a team sport, such as football, basketball, and soccer.  Jumping rope. Most people should get at least 75 minutes (1 hour and 15 minutes) a week of vigorous-intensity exercise to maintain their body weight. How can exercise affect me? When you exercise enough to burn more calories than you eat, you lose weight. Exercise also reduces body fat and builds muscle. The more muscle you have, the more calories you burn. Exercise also:  Improves mood.  Reduces stress and tension.  Improves your overall fitness, flexibility, and endurance.  Increases bone strength. The amount of exercise you need to lose weight depends on:  Your age.  The type of exercise.  Any health conditions you have.  Your overall physical ability. Talk to your health care  provider about how much exercise you need and what types of activities are safe for you. What actions can I take to lose weight? Nutrition   Make changes to your diet as told by your health care provider or diet and nutrition specialist (dietitian). This may include: ? Eating fewer calories. ? Eating more protein. ? Eating less unhealthy fats. ? Eating a diet that includes fresh fruits and vegetables, whole grains, low-fat dairy products, and lean protein. ? Avoiding foods with added fat, salt, and sugar.  Drink plenty of water while you exercise to prevent dehydration or heat stroke. Activity  Choose an activity that you enjoy and set realistic goals. Your health care provider can help you make an exercise plan that works for you.  Exercise at a moderate or vigorous intensity most days of the week. ? The intensity of exercise may vary from person to person. You can tell how intense a workout is for you by paying attention to your breathing and heartbeat. Most people will notice their breathing and heartbeat get faster with more intense exercise.  Do resistance training twice each week, such as: ? Push-ups. ? Sit-ups. ? Lifting weights. ? Using resistance bands.  Getting short amounts of exercise can be just as helpful as long structured periods of exercise. If you have trouble finding time to exercise, try to include exercise in your daily routine. ? Get up, stretch, and walk  around every 30 minutes throughout the day. ? Go for a walk during your lunch break. ? Park your car farther away from your destination. ? If you take public transportation, get off one stop early and walk the rest of the way. ? Make phone calls while standing up and walking around. ? Take the stairs instead of elevators or escalators.  Wear comfortable clothes and shoes with good support.  Do not exercise so much that you hurt yourself, feel dizzy, or get very short of breath. Where to find more  information  U.S. Department of Health and Human Services: [employer]  Centers for Disease Control and Prevention (CDC): http://www.wolf.info/ Contact a health care provider:  Before starting a new exercise program.  If you have questions or concerns about your weight.  If you have a medical problem that keeps you from exercising. Get help right away if you have any of the following while exercising:  Injury.  Dizziness.  Difficulty breathing or shortness of breath that does not go away when you stop exercising.  Chest pain.  Rapid heartbeat. Summary  Being overweight increases your risk of heart disease, stroke, diabetes, high blood pressure, and several types of cancer.  Losing weight happens when you burn more calories than you eat.  Reducing the amount of calories you eat in addition to getting regular moderate or vigorous exercise each week helps you lose weight. This information is not intended to replace advice given to you by your health care provider. Make sure you discuss any questions you have with your health care provider. Document Revised: 10/12/2017 Document Reviewed: 10/12/2017 Elsevier Patient Education  2020 [employer].

## 2020-03-30 NOTE — Chronic Care Management (AMB) (Signed)
Chronic Care Management Pharmacy  Name: Chelsey [name]  MRN: [id number] DOB: 24-May-1946    Chief Complaint/ HPI  Chelsey [name],  74 y.o. , female presents for their Initial CCM visit with the clinical pharmacist via telephone due to COVID-19 Pandemic. Patient is an active real estate agent and has been on her medications for a while now. She has no present concerns at this time. She mentions hard time getting her for appointment since she has appointments and visits with clients from time to time.  PCP : Burnis Medin, MD  Their chronic conditions include: HTN, HLD, type 2 diabetes, GERD, anxiety, hx of breast cancer   Office Visits: 01/20/20 OV - labs ok, A1c went up to 7.2% from 6.7% (08/03/19). No changes with medications  Consult Visit: None  Medications: Outpatient Encounter Medications as of 03/30/2020  Medication Sig  . ALPRAZolam (XANAX) 0.25 MG tablet Take 1 tablet (0.25 mg total) by mouth 2 (two) times daily as needed for anxiety. Avoid regular use.  Marland Kitchen anastrozole (ARIMIDEX) 1 MG tablet Take 1 tablet (1 mg total) by mouth daily.  Marland Kitchen azelastine (ASTELIN) 0.1 % nasal spray INSTILL 1 TO 2 SPRAYS INTO EACH NOSTRIL TWICE DAILY AS NEEDED  . clindamycin (CLEOCIN) 150 MG capsule TK 4 CS PO 1 HOUR PRIOR TO DENTAL APPOINTMENT  . denosumab (PROLIA) 60 MG/ML SOLN injection Inject 60 mg into the skin every 6 (six) months. Administer in upper arm, thigh, or abdomen  . esomeprazole (NEXIUM) 40 MG capsule Take 40 mg by mouth as needed. Takes bedtime  . losartan (COZAAR) 50 MG tablet TAKE 1 TABLET(50 MG) BY MOUTH DAILY  . metFORMIN (GLUCOPHAGE-XR) 500 MG 24 hr tablet TAKE 2 TABLETS BY MOUTH TWICE DAILY WITH MEALS  . Olopatadine HCl 0.2 % SOLN Pataday daily  . rosuvastatin (CRESTOR) 10 MG tablet TAKE 1 TABLET BY MOUTH EVERY DAY (Patient taking differently: every other day. Patient reports taking every other day)  . sertraline (ZOLOFT) 25 MG tablet TAKE 1 TABLET BY MOUTH EVERY DAY (Patient taking  differently: every other day. Patient reports taking every other day)  . traZODone (DESYREL) 50 MG tablet TAKE 1/2 TO 1 TABLET BY MOUTH AT BEDTIME AS NEEDED FOR SLEEP  . methocarbamol (ROBAXIN) 500 MG tablet Take 1 tablet (500 mg total) by mouth every 8 (eight) hours as needed for muscle spasms. (Patient not taking: Reported on 01/20/2020)   No facility-administered encounter medications on file as of 03/30/2020.     Current Diagnosis/Assessment:  Goals Addressed            This Visit's Progress   . Chronic Care Management       CARE PLAN ENTRY  Current Barriers:  . Chronic Disease Management support, education, and care coordination needs related to Hypertension, Hyperlipidemia, and Diabetes   Hypertension . Pharmacist Clinical Goal(s): o Over the next 180 days, patient will work with PharmD and providers to maintain BP goal <130/80 . Current regimen:  o Losartan 50 mg 1 tablet daily . Interventions: o Discussed diet and exercise extensively . Patient self care activities - Over the next 180 days, patient will: o Ensure daily salt intake < 2300 mg/day o Start to track weight loss  Hyperlipidemia . Pharmacist Clinical Goal(s): o Over the next 180 days, patient will work with PharmD and providers to achieve LDL goal < 100 . Current regimen:  o Rosuvastatin 10 mg 1 tablet daily . Interventions: o Discussed weight loss and diet . Patient  self care activities - Over the next 180 days, patient will: o Start diet and exercise to achieve weight loss  Diabetes . Pharmacist Clinical Goal(s): o Over the next 180 days, patient will work with PharmD and providers to achieve A1c goal <7% . Current regimen:  o Metformin XR 500 mg 2 tablets twice daily with meals . Interventions: o Discussed diet and weight loss extensively . Patient self care activities - Over the next 180 days, patient will: o Start diet and exercise to achieve weight loss o Contact provider with any episodes of  hypoglycemia  Medication management . Pharmacist Clinical Goal(s): o Over the next 180 days, patient will work with PharmD and providers to maintain optimal medication adherence . Current pharmacy: [facility] . Interventions o Comprehensive medication review performed. o Continue current medication management strategy . Patient self care activities - Over the next 180 days, patient will: o Focus on medication adherence by pill boxes o Take medications as prescribed o Report any questions or concerns to PharmD and/or provider(s)  Initial goal documentation        Hypertension    Office blood pressures are  BP Readings from Last 3 Encounters:  01/20/20 112/64  10/11/19 116/68  12/27/18 124/68    Patient has failed these meds in the past: lisinopril Patient is currently controlled on the following medications:  . Losartan 50 mg 1 tablet daily night  BP stable with goal of < 130/80. Patient denies checking her blood pressure at home. She also denies exercising. She reports gaining 5 lbs since the pandemic.   Plan  Continue current medications     Hyperlipidemia   Lipid Panel     Component Value Date/Time   CHOL 189 01/20/2020 1205   TRIG 178.0 (H) 01/20/2020 1205   TRIG 71 09/15/2006 1055   HDL 63.80 01/20/2020 1205   CHOLHDL 3 01/20/2020 1205   VLDL 35.6 01/20/2020 1205   LDLCALC 89 01/20/2020 1205   LDLDIRECT 173.9 11/09/2013 0915     The 10-year ASCVD risk score Mikey Bussing DC Jr., et al., 2013) is: 21.5%   Values used to calculate the score:     Age: 37 years     Sex: Female     Is Non-Hispanic African American: No     Diabetic: Yes     Tobacco smoker: No     Systolic Blood Pressure: 924 mmHg     Is BP treated: Yes     HDL Cholesterol: 63.8 mg/dL     Total Cholesterol: 189 mg/dL   Patient has failed these meds in past: atorvastatin Patient is currently controlled on the following medications:  . Rosuvastatin 10 mg 1 tablet every other day  Patient  reports eating whatever she wants to eat and she has gained 5 lbs for the past months. She denies exercise. Lipid panel WNL, except elevated TG at 178 with a goal of < 150. Counseled on starting a routine exercise and weight loss.  Plan  Continue current medications   Diabetes   Recent Relevant Labs: Lab Results  Component Value Date/Time   HGBA1C 7.2 (H) 01/20/2020 12:05 PM   HGBA1C 6.7 (A) 08/03/2019 09:35 AM   HGBA1C 6.7 08/03/2019 09:35 AM   HGBA1C 0.0 (A) 08/03/2019 09:35 AM   HGBA1C 0.0 08/03/2019 09:35 AM   HGBA1C 7.2 (H) 09/13/2018 10:12 AM   GFR 82.08 01/20/2020 12:05 PM   GFR 87.57 09/13/2018 10:12 AM   MICROALBUR <0.7 01/20/2020 12:05 PM   MICROALBUR <0.7 09/13/2018  10:12 AM     Checking BG: Never   Patient has failed these meds in past: none Patient is currently uncontrolled on the following medications:  Marland Kitchen Metformin XR 500 mg 2 tablets twice daily with AM  Last diabetic Eye exam:  Lab Results  Component Value Date/Time   HMDIABEYEEXA No Retinopathy 06/04/2018 12:00 AM    Last diabetic Foot exam: No results found for: HMDIABFOOTEX   Patient reports gaining weight from 183 lbs to 188 lbs. A1c went up to 7.2 from 6.7 in 5 months time. She denies routine exercise. Counseled on starting a routine exercise and weight loss.   Plan  Continue current medications    Osteoporosis due to aromatase inhibitor   VITD  Date Value Ref Range Status  02/11/2018 39.23 30.00 - 100.00 ng/mL Final     Patient has failed these meds in past: none  Patient is currently controlled on the following medications:   Prolia 60 mg/ml inject 60 mg into the skin every 6 months  Followed by oncology. Patient stable. No recent falls. Patient also takes vitamin D.   Plan  Continue current medications    Anxiety   Patient has failed these meds in past: zolpidem Patient is currently controlled on the following medications:  . Sertraline 25 mg 1 tablet every other  day . Trazodone 50 mg 1 tablet daily at bedtime  . Alprazolam 0.25 mg 1 tablet twice daily as needed  Patient has been using trazodone at bedtime regularly. She reports it to be working well. She also reports taking alprazolam rarely and having to take sertraline every other day.  Plan  Continue current medications    Hx of Breast Cancer   Patient has failed these meds in past: None Patient is currently controlled on the following medications:  . Anastrozole 1 mg 1 tablet  daily  Followed by oncology.  Plan  Continue current medications    GERD   Patient has failed these meds in past: none Patient is currently controlled on the following medications:  . Esomeprazole 40 mg 1 capsule at bedtime as needed   Patient reports taking esomeprazole rarely. Denies any symptoms of heartburn or acid reflux.  Plan  Continue current medications   Allergic Rhinitis   Patient has failed these meds in past: none Patient is currently controlled on the following medications:  . Olopatadine 0.2% 1 drop into both eyes as needed  . Azelastine 0.1% nasal spray 1-2 sprays into each nostril twice daily as needed  Patient is doing well with allergies for now since allergy season already passed.  Plan  Continue current medications   OTCs/Health Maintenance   Patient is currently controlled on the following medications: . Clindamaycin 150 mg 1 capsule 1 hr prior to dental appointment  Plan  Continue current medications   Vaccines   Reviewed and discussed patient's vaccination history.    Immunization History  Administered Date(s) Administered  . Fluad Quad(high Dose 65+) 08/30/2019  . Influenza, High Dose Seasonal PF 07/02/2015, 07/24/2016, 08/04/2017, 08/26/2018  . Influenza,inj,Quad PF,6+ Mos 06/23/2013  . PFIZER SARS-COV-2 Vaccination 11/19/2019, 12/13/2019  . Pneumococcal Conjugate-13 11/24/2013  . Td 10/03/2010    Plan  Recommended patient receive shingles  vaccine in the office/pharmacy.   Medication Management   Pharmacy/Benefits: [facility]/ [facility] Adherence: uses pill organizer Pt endorses 100% compliance  Patient has not complains with [facility] and she has been on top of her medications.  Plan  Continue current medication management strategy   Follow  up: 6 month phone visit  Geraldine Contras, PharmD Clinical Pharmacist [facility] [phone]

## 2020-05-01 ENCOUNTER — Other Ambulatory Visit: Payer: Self-pay | Admitting: Internal Medicine

## 2020-05-01 DIAGNOSIS — H04161 Lacrimal gland dislocation, right lacrimal gland: Secondary | ICD-10-CM | POA: Diagnosis not present

## 2020-05-01 DIAGNOSIS — Z961 Presence of intraocular lens: Secondary | ICD-10-CM | POA: Diagnosis not present

## 2020-05-01 DIAGNOSIS — H04123 Dry eye syndrome of bilateral lacrimal glands: Secondary | ICD-10-CM | POA: Diagnosis not present

## 2020-05-01 DIAGNOSIS — H0289 Other specified disorders of eyelid: Secondary | ICD-10-CM | POA: Diagnosis not present

## 2020-05-07 ENCOUNTER — Other Ambulatory Visit: Payer: Self-pay | Admitting: Internal Medicine

## 2020-05-07 NOTE — Telephone Encounter (Signed)
Last OV 01/20/2020  Last filled 03/09/2019, # 30 with 0 refills

## 2020-05-14 ENCOUNTER — Telehealth: Payer: Self-pay | Admitting: Internal Medicine

## 2020-05-14 NOTE — Telephone Encounter (Signed)
Spoke with patient she declined the AWV

## 2020-05-18 DIAGNOSIS — Z9011 Acquired absence of right breast and nipple: Secondary | ICD-10-CM | POA: Diagnosis not present

## 2020-05-18 DIAGNOSIS — C50912 Malignant neoplasm of unspecified site of left female breast: Secondary | ICD-10-CM | POA: Diagnosis not present

## 2020-05-29 DIAGNOSIS — Z791 Long term (current) use of non-steroidal anti-inflammatories (NSAID): Secondary | ICD-10-CM | POA: Diagnosis not present

## 2020-05-29 DIAGNOSIS — R918 Other nonspecific abnormal finding of lung field: Secondary | ICD-10-CM | POA: Diagnosis not present

## 2020-05-29 DIAGNOSIS — I890 Lymphedema, not elsewhere classified: Secondary | ICD-10-CM | POA: Diagnosis not present

## 2020-05-29 DIAGNOSIS — C50912 Malignant neoplasm of unspecified site of left female breast: Secondary | ICD-10-CM | POA: Diagnosis not present

## 2020-05-29 DIAGNOSIS — Z885 Allergy status to narcotic agent status: Secondary | ICD-10-CM | POA: Diagnosis not present

## 2020-05-29 DIAGNOSIS — Z923 Personal history of irradiation: Secondary | ICD-10-CM | POA: Diagnosis not present

## 2020-05-29 DIAGNOSIS — Z880 Allergy status to penicillin: Secondary | ICD-10-CM | POA: Diagnosis not present

## 2020-05-29 DIAGNOSIS — M8589 Other specified disorders of bone density and structure, multiple sites: Secondary | ICD-10-CM | POA: Diagnosis not present

## 2020-05-29 DIAGNOSIS — Z9012 Acquired absence of left breast and nipple: Secondary | ICD-10-CM | POA: Diagnosis not present

## 2020-05-29 DIAGNOSIS — R9389 Abnormal findings on diagnostic imaging of other specified body structures: Secondary | ICD-10-CM | POA: Diagnosis not present

## 2020-05-29 DIAGNOSIS — M8580 Other specified disorders of bone density and structure, unspecified site: Secondary | ICD-10-CM | POA: Diagnosis not present

## 2020-05-29 DIAGNOSIS — Z79811 Long term (current) use of aromatase inhibitors: Secondary | ICD-10-CM | POA: Diagnosis not present

## 2020-05-29 DIAGNOSIS — M25511 Pain in right shoulder: Secondary | ICD-10-CM | POA: Diagnosis not present

## 2020-05-29 DIAGNOSIS — Z853 Personal history of malignant neoplasm of breast: Secondary | ICD-10-CM | POA: Diagnosis not present

## 2020-05-29 DIAGNOSIS — M791 Myalgia, unspecified site: Secondary | ICD-10-CM | POA: Diagnosis not present

## 2020-05-29 DIAGNOSIS — Z888 Allergy status to other drugs, medicaments and biological substances status: Secondary | ICD-10-CM | POA: Diagnosis not present

## 2020-05-29 DIAGNOSIS — G4733 Obstructive sleep apnea (adult) (pediatric): Secondary | ICD-10-CM | POA: Diagnosis not present

## 2020-05-29 DIAGNOSIS — Z7984 Long term (current) use of oral hypoglycemic drugs: Secondary | ICD-10-CM | POA: Diagnosis not present

## 2020-05-29 DIAGNOSIS — Z79899 Other long term (current) drug therapy: Secondary | ICD-10-CM | POA: Diagnosis not present

## 2020-05-29 DIAGNOSIS — E1165 Type 2 diabetes mellitus with hyperglycemia: Secondary | ICD-10-CM | POA: Diagnosis not present

## 2020-06-15 DIAGNOSIS — G4733 Obstructive sleep apnea (adult) (pediatric): Secondary | ICD-10-CM | POA: Diagnosis not present

## 2020-07-07 ENCOUNTER — Other Ambulatory Visit: Payer: Self-pay | Admitting: Internal Medicine

## 2020-07-13 DIAGNOSIS — L720 Epidermal cyst: Secondary | ICD-10-CM | POA: Diagnosis not present

## 2020-07-13 DIAGNOSIS — M71342 Other bursal cyst, left hand: Secondary | ICD-10-CM | POA: Diagnosis not present

## 2020-07-23 ENCOUNTER — Ambulatory Visit: Payer: Medicare Other | Admitting: Internal Medicine

## 2020-07-31 ENCOUNTER — Other Ambulatory Visit: Payer: Self-pay | Admitting: Internal Medicine

## 2020-07-31 NOTE — Progress Notes (Deleted)
No chief complaint on file.   HPI: Chelsey [name] 74 y.o. come in for Chronic disease management   DM BP Breast   ROS: See pertinent positives and negatives per HPI.  Past Medical History:  Diagnosis Date  . Allergy   . Aphthous ulcer of mouth 2010   related to chemotherapy   . Breast cancer ([place])   . Carpal tunnel syndrome of right wrist   . Colitis   . Colon polyp   . Diabetes mellitus   . Diverticulosis   . GERD (gastroesophageal reflux disease)   . Hyperlipidemia   . Hypertension   . Lymphedema 08-31-13   left arm  . Osteoarthritis   . Pneumonia 07/28/2011  . Sinusitis   . Sleep apnea    setting of 10  . Squamous cell carcinoma    left leg    Family History  Problem Relation Age of Onset  . Alzheimer's disease Mother   . Sleep apnea Mother   . Diabetes Mother   . Hypertension Mother   . Hyperlipidemia Mother   . Breast cancer Mother 81       triple negative  . Anxiety disorder Daughter   . Hypertension Son        pulmonary  . Congenital heart disease Son 0       TGA  died age 8     Social History   Socioeconomic History  . Marital status: Married    Spouse name: Not on file  . Number of children: 3  . Years of education: Not on file  . Highest education level: Not on file  Occupational History  . Occupation: Architectural technologist: [employer]  . Occupation: Architectural technologist: [employer]  Tobacco Use  . Smoking status: Never Smoker  . Smokeless tobacco: Never Used  Vaping Use  . Vaping Use: Never used  Substance and Sexual Activity  . Alcohol use: Yes    Alcohol/week: 6.0 standard drinks    Types: 6 Glasses of wine per week    Comment: 1-2 glasses every other night  . Drug use: No  . Sexual activity: Yes    Birth control/protection: Post-menopausal, Surgical  Other Topics Concern  . 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 Difficulty of Paying Living Expenses: Not on file  Food Insecurity:   . Worried About Charity fundraiser in the Last Year: Not on file  . Ran Out of Food in the Last Year: Not on file  Transportation Needs:   . Lack of Transportation (Medical): Not on file  . Lack of Transportation (Non-Medical): Not on file  Physical Activity:   . Days of Exercise per Week: Not on file  . Minutes of Exercise per Session: Not on file  Stress:   . Feeling of Stress : Not on file  Social Connections:   . Frequency of Communication with Friends and Family: Not on file  . Frequency of Social Gatherings with Friends and Family: Not on file  . Attends Religious Services: Not on file  . Active Member of Clubs or Organizations: Not on file  . Attends Archivist Meetings: Not  on file  . Marital Status: Not on file    Outpatient Medications Prior to Visit  Medication Sig Dispense Refill  . ALPRAZolam (XANAX) 0.25 MG tablet TAKE 1 TABLET(0.25 MG) BY MOUTH TWICE DAILY AS NEEDED FOR ANXIETY. AVOID REGULAR USE 30 tablet 0  . anastrozole (ARIMIDEX) 1 MG tablet Take 1 tablet (1 mg total) by mouth daily. 90 tablet 5  . azelastine (ASTELIN) 0.1 % nasal spray INSTILL 1 TO 2 SPRAYS INTO EACH NOSTRIL TWICE DAILY AS NEEDED 30 mL 5  . clindamycin (CLEOCIN) 150 MG capsule TK 4 CS PO 1 HOUR PRIOR TO DENTAL APPOINTMENT    . denosumab (PROLIA) 60 MG/ML SOLN injection Inject 60 mg into the skin every 6 (six) months. Administer in upper arm, thigh, or abdomen    . esomeprazole (NEXIUM) 40 MG capsule Take 40 mg by mouth as needed. Takes bedtime    . losartan (COZAAR) 50 MG tablet TAKE 1 TABLET(50 MG) BY MOUTH DAILY 90 tablet 3  . metFORMIN (GLUCOPHAGE-XR) 500 MG 24 hr tablet TAKE 2 TABLETS BY MOUTH TWICE DAILY WITH MEALS 360 tablet 0  . methocarbamol (ROBAXIN) 500 MG tablet Take 1 tablet (500 mg total) by mouth  every 8 (eight) hours as needed for muscle spasms. (Patient not taking: Reported on 01/20/2020) 30 tablet 0  . Olopatadine HCl 0.2 % SOLN Pataday daily    . rosuvastatin (CRESTOR) 10 MG tablet TAKE 1 TABLET BY MOUTH EVERY DAY (Patient taking differently: every other day. Patient reports taking every other day) 90 tablet 1  . sertraline (ZOLOFT) 25 MG tablet TAKE 1 TABLET BY MOUTH EVERY DAY 90 tablet 0  . traZODone (DESYREL) 50 MG tablet TAKE 1/2 TO 1 TABLET BY MOUTH AT BEDTIME AS NEEDED FOR SLEEP 30 tablet 6   No facility-administered medications prior to visit.     EXAM:  There were no vitals taken for this visit.  There is no height or weight on file to calculate BMI.  GENERAL: vitals reviewed and listed above, alert, oriented, appears well hydrated and in no acute distress HEENT: atraumatic, conjunctiva  clear, no obvious abnormalities on inspection of external nose and ears OP : no lesion edema or exudate  NECK: no obvious masses on inspection palpation  LUNGS: clear to auscultation bilaterally, no wheezes, rales or rhonchi, good air movement CV: HRRR, no clubbing cyanosis or  peripheral edema nl cap refill  MS: moves all extremities without noticeable focal  abnormality PSYCH: pleasant and cooperative, no obvious depression or anxiety Lab Results  Component Value Date   WBC 5.6 01/20/2020   HGB 13.4 01/20/2020   HCT 38.4 01/20/2020   PLT 185.0 01/20/2020   GLUCOSE 158 (H) 01/20/2020   CHOL 189 01/20/2020   TRIG 178.0 (H) 01/20/2020   HDL 63.80 01/20/2020   LDLDIRECT 173.9 11/09/2013   LDLCALC 89 01/20/2020   ALT 19 01/20/2020   AST 17 01/20/2020   NA 141 01/20/2020   K 4.5 01/20/2020   CL 102 01/20/2020   CREATININE 0.70 01/20/2020   BUN 13 01/20/2020   CO2 29 01/20/2020   TSH 1.54 01/20/2020   INR 0.93 08/31/2013   HGBA1C 7.2 (H) 01/20/2020   MICROALBUR <0.7 01/20/2020   BP Readings from Last 3 Encounters:  01/20/20 112/64  10/11/19 116/68  12/27/18 124/68     ASSESSMENT AND PLAN:  Discussed the following assessment and plan:  No diagnosis found. Check care gaps  -Patient advised to return or notify health care team  if  new concerns arise.  There are no Patient Instructions on file for this visit.   Standley Brooking. Abhi Moccia M.D.

## 2020-08-01 ENCOUNTER — Telehealth: Payer: Self-pay | Admitting: *Deleted

## 2020-08-01 ENCOUNTER — Other Ambulatory Visit: Payer: Self-pay

## 2020-08-01 ENCOUNTER — Ambulatory Visit: Payer: Medicare Other | Admitting: Internal Medicine

## 2020-08-01 MED ORDER — TRAZODONE HCL 50 MG PO TABS: 25.0000 mg | ORAL_TABLET | Freq: Every evening | ORAL | 6 refills | Status: DC | PRN

## 2020-08-01 MED ORDER — SERTRALINE HCL 25 MG PO TABS
25.0000 mg | ORAL_TABLET | Freq: Every day | ORAL | 0 refills | Status: DC
Start: 2020-08-01 — End: 2021-02-19

## 2020-08-01 NOTE — Telephone Encounter (Signed)
Patient came in late for appointment today, the appointment has been rescheduled to 08/29/20 that is the next available. Patient is requesting her medications to be refilled trazodone, Zoloft. Patient also wants to know if she needs blood work as well. Please contact patient at [phone].

## 2020-08-01 NOTE — Telephone Encounter (Signed)
I left pt a voicemail to call the office back to move her appt to next week. I also let her know that we refilled her prescriptions & she will have a POC A1C during her visit.

## 2020-08-01 NOTE — Telephone Encounter (Signed)
have her come in October 27 next Wednesday there are plenty of openings. No we dont  need to do lab ahead but need  Hg a1c at thev uisit POCT   Refill her meds as requested if she doesn't have enough to get to the visit .   Please advise why  Next avialble was listed as Nov 17  ?

## 2020-08-06 NOTE — Progress Notes (Signed)
Chief Complaint  Patient presents with   Follow-up    meds  left middle finger  issue   Diabetes   Medication Management    HPI: Chelsey [name] 74 y.o. come in for Chronic disease management   Due for med check and a1c  On metformin ok no vision neuro change   Taking  crestor every other day  To limit se of aching  se  wat that dose .  Finger  Nodule  Punctured but derm weeks ago now red and tender no dc  Said may need to go to hand surgeon  Left arm  Has some lymphedema but ok   Work very stressful in Scientist, research (life sciences) estate sometimes forgets to eat  On metformin  Using trazadone nightly   ocass alprax  And 25 wertraline ROS: See pertinent positives and negatives per HPI.  Past Medical History:  Diagnosis Date   Allergy    Aphthous ulcer of mouth 2010   related to chemotherapy    Breast cancer ([place])    Carpal tunnel syndrome of right wrist    Colitis    Colon polyp    Diabetes mellitus    Diverticulosis    GERD (gastroesophageal reflux disease)    Hyperlipidemia    Hypertension    Lymphedema 08-31-13   left arm   Osteoarthritis    Pneumonia 07/28/2011   Sinusitis    Sleep apnea    setting of 10   Squamous cell carcinoma    left leg    Family History  Problem Relation Age of Onset   Alzheimer's disease Mother    Sleep apnea Mother    Diabetes Mother    Hypertension Mother    Hyperlipidemia Mother    Breast cancer Mother 83       triple negative   Anxiety disorder Daughter    Hypertension Son        pulmonary   Congenital heart disease Son 0       TGA  died age 20     Social History   Socioeconomic History   Marital status: Married    Spouse name: Not on file   Number of children: 3   Years of education: Not on file   Highest education level: Not on file  Occupational History   Occupation: Architectural technologist: [employer]   Occupation: Architectural technologist: [employer] Use   Smoking status: Never Smoker    Smokeless tobacco: Never Used  Vaping Use   Vaping Use: Never used  Substance and Sexual Activity   Alcohol use: Yes    Alcohol/week: 6.0 standard drinks    Types: 6 Glasses of wine per week    Comment: 1-2 glasses every other night   Drug use: No   Sexual activity: Yes    Birth control/protection: Post-menopausal, Surgical  Other Topics Concern   Not on file  Social History Narrative   cb x 3   Realtor married    Bereaved  Parent   2 adult children   Mother with dementia and breast cancer   Passed away sept 25 15  Age 39   Social Determinants of Radio broadcast assistant Strain:    Difficulty of Paying Living Expenses: Not on file  Food Insecurity:    Worried About Charity fundraiser in the Last Year: Not on file   [employer] of Food in the Last Year: Not on file  Transportation Needs:    Film/video editor (Medical): Not on file   Lack of Transportation (Non-Medical): Not on file  Physical Activity:    Days of Exercise per Week: Not on file   Minutes of Exercise per Session: Not on file  Stress:    Feeling of Stress : Not on file  Social Connections:    Frequency of Communication with Friends and Family: Not on file   Frequency of Social Gatherings with Friends and Family: Not on file   Attends Religious Services: Not on file   Active Member of Clubs or Organizations: Not on file   Attends Archivist Meetings: Not on file   Marital Status: Not on file    Outpatient Medications Prior to Visit  Medication Sig Dispense Refill   ALPRAZolam (XANAX) 0.25 MG tablet TAKE 1 TABLET(0.25 MG) BY MOUTH TWICE DAILY AS NEEDED FOR ANXIETY. AVOID REGULAR USE 30 tablet 0   anastrozole (ARIMIDEX) 1 MG tablet Take 1 tablet (1 mg total) by mouth daily. 90 tablet 5   azelastine (ASTELIN) 0.1 % nasal spray INSTILL 1 TO 2 SPRAYS INTO EACH NOSTRIL TWICE DAILY AS NEEDED 30 mL 5   clindamycin (CLEOCIN) 150 MG capsule TK 4 CS PO 1 HOUR PRIOR TO DENTAL  APPOINTMENT     denosumab (PROLIA) 60 MG/ML SOLN injection Inject 60 mg into the skin every 6 (six) months. Administer in upper arm, thigh, or abdomen     esomeprazole (NEXIUM) 40 MG capsule Take 40 mg by mouth as needed. Takes bedtime     metFORMIN (GLUCOPHAGE-XR) 500 MG 24 hr tablet TAKE 2 TABLETS BY MOUTH TWICE DAILY WITH MEALS 360 tablet 0   methocarbamol (ROBAXIN) 500 MG tablet Take 1 tablet (500 mg total) by mouth every 8 (eight) hours as needed for muscle spasms. 30 tablet 0   Olopatadine HCl 0.2 % SOLN [place] daily     rosuvastatin (CRESTOR) 10 MG tablet TAKE 1 TABLET BY MOUTH EVERY DAY (Patient taking differently: every other day. Patient reports taking every other day) 90 tablet 1   sertraline (ZOLOFT) 25 MG tablet Take 1 tablet (25 mg total) by mouth daily. 90 tablet 0   traZODone (DESYREL) 50 MG tablet Take 0.5-1 tablets (25-50 mg total) by mouth at bedtime as needed. for sleep 30 tablet 6   losartan (COZAAR) 50 MG tablet TAKE 1 TABLET(50 MG) BY MOUTH DAILY 90 tablet 3   No facility-administered medications prior to visit.     EXAM:  BP 132/70    Pulse 60    Temp 98.3 F (36.8 C) (Oral)    Ht 5\' 3"  (1.6 m)    Wt 187 lb 6.4 oz (85 kg)    SpO2 99%    BMI 33.20 kg/m   Body mass index is 33.2 kg/m.  GENERAL: vitals reviewed and listed above, alert, oriented, appears well hydrated and in no acute distress HEENT: atraumatic, conjunctiva  clear, no obvious abnormalities on inspection of external nose and ears OP : masked  NECK: no obvious masses on inspection palpation  LUNGS: clear to auscultation bilaterally, no wheezes, rales or rhonchi, good air movement CV: HRRR, no clubbing cyanosis or  peripheral edema nl cap refill  MS: moves all extremities left middle finger with nodule at nail base  And red around 1 cm  PSYCH: pleasant and cooperative, no obvious depression or anxiety Lab Results  Component Value Date   WBC 5.6 01/20/2020   HGB 13.4 01/20/2020   HCT 38.4  01/20/2020   PLT 185.0 01/20/2020   GLUCOSE 158 (H) 01/20/2020   CHOL 189 01/20/2020   TRIG 178.0 (H) 01/20/2020   HDL 63.80 01/20/2020   LDLDIRECT 173.9 11/09/2013   LDLCALC 89 01/20/2020   ALT 19 01/20/2020   AST 17 01/20/2020   NA 141 01/20/2020   K 4.5 01/20/2020   CL 102 01/20/2020   CREATININE 0.70 01/20/2020   BUN 13 01/20/2020   CO2 29 01/20/2020   TSH 1.54 01/20/2020   INR 0.93 08/31/2013   HGBA1C 7.0 (A) 08/07/2020   MICROALBUR <0.7 01/20/2020   BP Readings from Last 3 Encounters:  08/07/20 132/70  01/20/20 112/64  10/11/19 116/68    ASSESSMENT AND PLAN:  Discussed the following assessment and plan:  Type 2 diabetes mellitus with hyperglycemia, without long-term current use of insulin (HCC) - dsuc sglt2 mds and glp 1 not interested in inj but maybe rybeslus ok can help with wegiht control - Plan: Hepatic function panel, TSH, Lipid panel, BASIC METABOLIC PANEL WITH GFR, CBC with Differential/Platelet, Microalbumin / creatinine urine ratio, Hemoglobin A1c, POCT HgB A1C  Medication management - Plan: Hepatic function panel, TSH, Lipid panel, BASIC METABOLIC PANEL WITH GFR, CBC with Differential/Platelet, Microalbumin / creatinine urine ratio, Hemoglobin A1c  Essential hypertension - Plan: Hepatic function panel, TSH, Lipid panel, BASIC METABOLIC PANEL WITH GFR, CBC with Differential/Platelet, Microalbumin / creatinine urine ratio, Hemoglobin A1c  HX: breast cancer - Plan: Hepatic function panel, TSH, Lipid panel, BASIC METABOLIC PANEL WITH GFR, CBC with Differential/Platelet, Microalbumin / creatinine urine ratio, Hemoglobin A1c  Hyperlipidemia, unspecified hyperlipidemia type - Plan: Hepatic function panel, TSH, Lipid panel, BASIC METABOLIC PANEL WITH GFR, CBC with Differential/Platelet, Microalbumin / creatinine urine ratio, Hemoglobin A1c  Malignant neoplasm of left female breast, unspecified estrogen receptor status, unspecified site of breast ([place])  Need for  influenza vaccination - Plan: Flu Vaccine QUAD High Dose(Fluad), CANCELED: Flu vaccine HIGH DOSE PF (Fluzone High dose)  Nodule of finger, left - poss infection early  use topical ab p soaking add oral antibiotic 5 days  resolution may be hand surgeron Plan fasting labs and cpx in APril  -Patient advised to return or notify health care team  if  new concerns arise.  Patient Instructions  Continue lifestyle intervention healthy eating and exercise .   Consider  rybelsus  oral for diabetes and weight loss help.   Plan full labs and preventive check in April .  Soak and  Topical antibiotic and   Add  Oral  antibiotic .   consider seeing hand surgeon .       Standley Brooking. Anwyn Kriegel M.D.

## 2020-08-07 ENCOUNTER — Other Ambulatory Visit: Payer: Self-pay

## 2020-08-07 ENCOUNTER — Encounter: Payer: Self-pay | Admitting: Internal Medicine

## 2020-08-07 ENCOUNTER — Ambulatory Visit (INDEPENDENT_AMBULATORY_CARE_PROVIDER_SITE_OTHER): Payer: Medicare Other | Admitting: Internal Medicine

## 2020-08-07 VITALS — BP 132/70 | HR 60 | Temp 98.3°F | Ht 63.0 in | Wt 187.4 lb

## 2020-08-07 DIAGNOSIS — I10 Essential (primary) hypertension: Secondary | ICD-10-CM | POA: Diagnosis not present

## 2020-08-07 DIAGNOSIS — Z79899 Other long term (current) drug therapy: Secondary | ICD-10-CM | POA: Diagnosis not present

## 2020-08-07 DIAGNOSIS — Z23 Encounter for immunization: Secondary | ICD-10-CM | POA: Diagnosis not present

## 2020-08-07 DIAGNOSIS — Z853 Personal history of malignant neoplasm of breast: Secondary | ICD-10-CM

## 2020-08-07 DIAGNOSIS — C50912 Malignant neoplasm of unspecified site of left female breast: Secondary | ICD-10-CM

## 2020-08-07 DIAGNOSIS — E785 Hyperlipidemia, unspecified: Secondary | ICD-10-CM

## 2020-08-07 DIAGNOSIS — E1165 Type 2 diabetes mellitus with hyperglycemia: Secondary | ICD-10-CM

## 2020-08-07 DIAGNOSIS — R2232 Localized swelling, mass and lump, left upper limb: Secondary | ICD-10-CM

## 2020-08-07 LAB — POCT GLYCOSYLATED HEMOGLOBIN (HGB A1C): Hemoglobin A1C: 7 % — AB (ref 4.0–5.6)

## 2020-08-07 MED ORDER — LOSARTAN POTASSIUM 50 MG PO TABS
ORAL_TABLET | ORAL | 2 refills | Status: DC
Start: 2020-08-07 — End: 2021-09-18

## 2020-08-07 MED ORDER — CEPHALEXIN 500 MG PO CAPS: 500.0000 mg | ORAL_CAPSULE | Freq: Three times a day (TID) | ORAL | 0 refills | Status: AC

## 2020-08-07 NOTE — Patient Instructions (Addendum)
Continue lifestyle intervention healthy eating and exercise .   Consider  rybelsus  oral for diabetes and weight loss help.   Plan full labs and preventive check in April .  Soak and  Topical antibiotic and   Add  Oral  antibiotic .   consider seeing hand surgeon .

## 2020-08-29 ENCOUNTER — Ambulatory Visit: Payer: Medicare Other | Admitting: Internal Medicine

## 2020-09-24 ENCOUNTER — Telehealth: Payer: Self-pay | Admitting: Internal Medicine

## 2020-09-24 ENCOUNTER — Telehealth: Payer: Medicare Other

## 2020-09-24 DIAGNOSIS — G4733 Obstructive sleep apnea (adult) (pediatric): Secondary | ICD-10-CM

## 2020-09-24 NOTE — Telephone Encounter (Signed)
Yes- please refer for mask fitting

## 2020-09-24 NOTE — Telephone Encounter (Signed)
Spoke with pt's spouse, aware of recs.  Order placed.  Nothing further needed at this time- will close encounter.

## 2020-09-24 NOTE — Telephone Encounter (Signed)
Patient calling to request a referral to [facility] at the sleep lab for a cpap mask best fit appointment. States she wears nasal pillows and it is starting to cause discomfort around nose with wear.  CY please advise if ok to place referral.  Thanks!

## 2020-09-25 ENCOUNTER — Telehealth: Payer: Self-pay | Admitting: Pharmacist

## 2020-09-25 NOTE — Chronic Care Management (AMB) (Signed)
I left the patient a message about her upcoming appointment on 09-26-2020 @ 1:30 PM with the clinical pharmacist. She was asked to please have all medication on hand to review the pharmacist.She called back a few minutes later and not to be called again. She has no issues and managing her medications and would like to be disenrolled.  Maia Breslow, [place] Assistant [phone]

## 2020-09-26 ENCOUNTER — Telehealth: Payer: Medicare Other

## 2020-10-10 ENCOUNTER — Ambulatory Visit: Payer: Medicare Other | Admitting: Internal Medicine

## 2020-10-22 ENCOUNTER — Other Ambulatory Visit: Payer: Self-pay | Admitting: Internal Medicine

## 2020-10-23 ENCOUNTER — Ambulatory Visit (HOSPITAL_BASED_OUTPATIENT_CLINIC_OR_DEPARTMENT_OTHER): Payer: Medicare Other | Attending: Internal Medicine | Admitting: Internal Medicine

## 2020-10-23 DIAGNOSIS — G4733 Obstructive sleep apnea (adult) (pediatric): Secondary | ICD-10-CM

## 2020-10-24 ENCOUNTER — Other Ambulatory Visit: Payer: Self-pay

## 2020-11-22 DIAGNOSIS — Z9889 Other specified postprocedural states: Secondary | ICD-10-CM | POA: Diagnosis not present

## 2020-11-26 ENCOUNTER — Ambulatory Visit: Payer: Medicare Other | Admitting: Internal Medicine

## 2020-11-26 NOTE — Addendum Note (Signed)
Addended by: Marrion Coy on: 11/26/2020 01:36 PM   Modules accepted: Orders

## 2020-11-27 ENCOUNTER — Encounter: Payer: Self-pay | Admitting: Internal Medicine

## 2020-12-04 DIAGNOSIS — R928 Other abnormal and inconclusive findings on diagnostic imaging of breast: Secondary | ICD-10-CM | POA: Diagnosis not present

## 2020-12-04 DIAGNOSIS — Z79811 Long term (current) use of aromatase inhibitors: Secondary | ICD-10-CM | POA: Diagnosis not present

## 2020-12-04 DIAGNOSIS — C50912 Malignant neoplasm of unspecified site of left female breast: Secondary | ICD-10-CM | POA: Diagnosis not present

## 2020-12-04 DIAGNOSIS — Z8739 Personal history of other diseases of the musculoskeletal system and connective tissue: Secondary | ICD-10-CM | POA: Diagnosis not present

## 2020-12-04 DIAGNOSIS — I890 Lymphedema, not elsewhere classified: Secondary | ICD-10-CM | POA: Diagnosis not present

## 2020-12-04 DIAGNOSIS — G4733 Obstructive sleep apnea (adult) (pediatric): Secondary | ICD-10-CM | POA: Diagnosis not present

## 2020-12-04 DIAGNOSIS — Z5181 Encounter for therapeutic drug level monitoring: Secondary | ICD-10-CM | POA: Diagnosis not present

## 2020-12-04 DIAGNOSIS — Z853 Personal history of malignant neoplasm of breast: Secondary | ICD-10-CM | POA: Diagnosis not present

## 2020-12-04 DIAGNOSIS — C50812 Malignant neoplasm of overlapping sites of left female breast: Secondary | ICD-10-CM | POA: Diagnosis not present

## 2020-12-04 DIAGNOSIS — I972 Postmastectomy lymphedema syndrome: Secondary | ICD-10-CM | POA: Diagnosis not present

## 2020-12-04 DIAGNOSIS — Z9012 Acquired absence of left breast and nipple: Secondary | ICD-10-CM | POA: Diagnosis not present

## 2020-12-04 DIAGNOSIS — R918 Other nonspecific abnormal finding of lung field: Secondary | ICD-10-CM | POA: Diagnosis not present

## 2020-12-04 DIAGNOSIS — K5730 Diverticulosis of large intestine without perforation or abscess without bleeding: Secondary | ICD-10-CM | POA: Diagnosis not present

## 2020-12-04 DIAGNOSIS — E1165 Type 2 diabetes mellitus with hyperglycemia: Secondary | ICD-10-CM | POA: Diagnosis not present

## 2020-12-04 DIAGNOSIS — Z79899 Other long term (current) drug therapy: Secondary | ICD-10-CM | POA: Diagnosis not present

## 2020-12-04 DIAGNOSIS — M8580 Other specified disorders of bone density and structure, unspecified site: Secondary | ICD-10-CM | POA: Diagnosis not present

## 2020-12-04 DIAGNOSIS — M8589 Other specified disorders of bone density and structure, multiple sites: Secondary | ICD-10-CM | POA: Diagnosis not present

## 2020-12-12 DIAGNOSIS — G4733 Obstructive sleep apnea (adult) (pediatric): Secondary | ICD-10-CM | POA: Diagnosis not present

## 2020-12-21 ENCOUNTER — Ambulatory Visit: Payer: Medicare Other | Attending: Physician Assistant | Admitting: Rehabilitation

## 2020-12-21 ENCOUNTER — Encounter: Payer: Self-pay | Admitting: Rehabilitation

## 2020-12-21 ENCOUNTER — Other Ambulatory Visit: Payer: Self-pay

## 2020-12-21 DIAGNOSIS — M6281 Muscle weakness (generalized): Secondary | ICD-10-CM | POA: Insufficient documentation

## 2020-12-21 DIAGNOSIS — R293 Abnormal posture: Secondary | ICD-10-CM | POA: Diagnosis not present

## 2020-12-21 DIAGNOSIS — I972 Postmastectomy lymphedema syndrome: Secondary | ICD-10-CM | POA: Diagnosis not present

## 2020-12-21 NOTE — Patient Instructions (Signed)
Self bandaging the arm:   Follow along with this video:  Https://www.youtube.com/watch?[email or web address]  -OR-  Search in your internet browser: "self bandaging arm MD [place]"   And the video should appear in the video search results "Lymphedema Management: Self bandaging your arm" on YouTube   This video is for caregiver assistance:  https://www.youtube.com/watch?v=[email or web address]  -OR-  Enter " Lymphedema Management: Caregiver Bandaging you arm" into your search browser and your video should appear in the results   

## 2020-12-21 NOTE — Therapy (Signed)
[place], [place], [address] Phone: [phone]   Fax:  [phone]  Physical Therapy Evaluation  Patient Details  Name: Chelsey [name] MRN: [id number] Date of Birth: 1946/04/20 Referring Provider (PT): Magda Kiel, [place]   Encounter Date: 12/21/2020   PT End of Session - 12/21/20 1052    Visit Number 1    Number of Visits 17    Date for PT Re-Evaluation 02/15/21    Authorization Type MCR    Progress Note Due on Visit 10    PT Start Time 0900    PT Stop Time 0955    PT Time Calculation (min) 55 min    Activity Tolerance Patient tolerated treatment well    Behavior During Therapy [facility] for tasks assessed/performed           Past Medical History:  Diagnosis Date  . Allergy   . Aphthous ulcer of mouth 2010   related to chemotherapy   . Breast cancer ([place])   . Carpal tunnel syndrome of right wrist   . Colitis   . Colon polyp   . Diabetes mellitus   . Diverticulosis   . GERD (gastroesophageal reflux disease)   . Hyperlipidemia   . Hypertension   . Lymphedema 08-31-13   left arm  . Osteoarthritis   . Pneumonia 07/28/2011  . Sinusitis   . Sleep apnea    setting of 10  . Squamous cell carcinoma    left leg    Past Surgical History:  Procedure Laterality Date  . AUGMENTATION MAMMAPLASTY Left   . CESAREAN SECTION     x 3  . MASTECTOMY Left 11/23   2010  . RECONSTRUCTION BREAST IMMEDIATE / DELAYED W/ TISSUE EXPANDER Left   . REDUCTION MAMMAPLASTY Right   . SKIN GRAFT    . SQUAMOUS CELL CARCINOMA EXCISION Left    Left lower extremity  . TONSILLECTOMY     as child  . TOTAL KNEE ARTHROPLASTY Left 12/20/2012   Procedure: TOTAL KNEE ARTHROPLASTY;  Surgeon: Gearlean Alf, MD;  Location: [facility];  Service: Orthopedics;  Laterality: Left;  . TOTAL KNEE ARTHROPLASTY Right 09/12/2013   Procedure: RIGHT TOTAL KNEE ARTHROPLASTY;  Surgeon: Gearlean Alf, MD;  Location: [facility];  Service:  Orthopedics;  Laterality: Right;  . TUBAL LIGATION      There were no vitals filed for this visit.    Subjective Assessment - 12/21/20 0859    Subjective I got a pump and I have been using it.  I have never used the custom sleeves.  My husbnd used to wrap my arm but can't remember how.    Patient is accompained by: Family member   husband   Pertinent History Left mastectomy with implant in 2010 due to ER/PR+ breast cancer. Completed chemotherapy and radiation, now on arimidex.  Previous therapy here for lymphedema, has pump using 1 hour per day, has too small sleeve.  Has a reid sleeve for night.    Patient Stated Goals Can I get the arm down    Currently in Pain? No/denies              [facility] PT Assessment - 12/21/20 0001      Assessment   Medical Diagnosis left breast cancer    Referring Provider (PT) Ashlyn Hedgecock, PA-C    Onset Date/Surgical Date 10/13/08    Hand Dominance Right    Prior Therapy yes      Precautions  Precaution Comments lymphedema      Restrictions   Weight Bearing Restrictions No      Balance Screen   Has the patient fallen in the past 6 months No    Has the patient had a decrease in activity level because of a fear of falling?  No    Is the patient reluctant to leave their home because of a fear of falling?  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    Leisure no exercise      Cognition   Overall Cognitive Status Within Functional Limits for tasks assessed      Observation/Other Assessments   Observations left UE larger than right      Posture/Postural Control   Posture/Postural Control Postural limitations    Postural Limitations Rounded Shoulders;Forward head      ROM / Strength   AROM / PROM / Strength AROM      AROM   Overall AROM  Comments no concerns per patient    AROM Assessment Site Shoulder    Right/Left Shoulder Right;Left             LYMPHEDEMA/ONCOLOGY QUESTIONNAIRE - 12/21/20 0001      Type   Cancer Type left breast       Surgeries   Mastectomy Date 09/04/09    Number Lymph Nodes Removed 10      Treatment   Past Chemotherapy Treatment No    Active Radiation Treatment Yes    Past Radiation Treatment No    Current Hormone Treatment Yes    Drug Name arimidex      What other symptoms do you have   Are you Having Heaviness or Tightness Yes    Are you having Pain Yes    Are you having pitting edema Yes      Lymphedema Assessments   Lymphedema Assessments Upper extremities      Right Upper Extremity Lymphedema   15 cm Proximal to Olecranon Process 37.4 cm    10 cm Proximal to Olecranon Process 34.4 cm    Olecranon Process 28.9 cm    15 cm Proximal to Ulnar Styloid Process 28.9 cm    10 cm Proximal to Ulnar Styloid Process 25.5 cm    Just Proximal to Ulnar Styloid Process 18.3 cm    Across Hand at [employer] 21.2 cm    At [place] of 2nd Digit 7.4 cm    Other 13    Other 2867ml      Left Upper Extremity Lymphedema   15 cm Proximal to Olecranon Process 39 cm    10 cm Proximal to Olecranon Process 36.7 cm    Olecranon Process 33.3 cm    15 cm Proximal to Ulnar Styloid Process 33.7 cm    10 cm Proximal to Ulnar Styloid Process 29.7 cm    Just Proximal to Ulnar Styloid Process 20.3 cm    Across Hand at [employer] 21 cm    At [employer] of 2nd Digit 7.5 cm    Other 3613ml                   Objective measurements completed on examination: See above findings.       [facility] Adult PT Treatment/Exercise - 12/21/20  0001      Self-Care   Self-Care Other Self-Care Comments    Other Self-Care Comments  reviewed bandaging with pt and pt's husband with use of tg stockinette, artiflex from hand to axilla, 1-6cm hand bandage, 1-8cm, and 2-10cm bandages from wrist to axilla with  reminders on overlap, stretch, hold, and patient positioning.  Pt reported never wrapping the fingers before but was given finger bandages if needed.      Manual Therapy   Manual Therapy Edema management    Manual therapy comments gave pt link to self bandaging, donning gloves, and arm butler    Edema Management able to donn CL2 flat knit garment with a bit of tightness in the forearm and also able to use the arm butler to donn                  PT Education - 12/21/20 1052    Education Details POC, bandaging review    Person(s) Educated Patient;Spouse    Methods Explanation;Demonstration;Tactile cues;Verbal cues;Handout    Comprehension Verbalized understanding;Returned demonstration;Verbal cues required;Tactile cues required;Need further instruction               PT Long Term Goals - 12/21/20 1057      PT LONG TERM GOAL #1   Title Pt will be able to manage her lymphedema at home with use of elevation exercise, compression garments and compression pump    Time 8    Period Weeks    Status New      PT LONG TERM GOAL #2   Title Pt will decrease volume of Lt UE by at least 18ml    Baseline 3610    Time 8    Period Weeks    Status New      PT LONG TERM GOAL #3   Title Pt will have decrease in left UE at 10 cm proximal to ulnar styloid by 2 cm    Baseline 29.7    Time 8    Period Weeks    Status New                  Plan - 12/21/20 1053    Clinical Impression Statement Known pt to this clinic returns with difficulty controlling the left lymphedema especially in the forearm with pitting edema here.  Pt reports she had 2 mosquito bites last year and then the forearm has really increased.  Pt currently has a pump and 3 flat knit garments with gloves but is unable to donn with husband.  PT was able to get them on today and pt was able to use the butler to assist so they may work when pt reduces the forearm a bit.  Pt unable to get in next week so husband was  reminded of bandaging and he will perform as able next week.  Stressed the improtance of compression and not just pumping.  Pt will be going to [place] on 01/24/21 and would like to be ready for this.  No ROM concerns per pt.    Personal Factors and Comorbidities Age;Comorbidity 2;Past/Current Experience;Fitness;Time since onset of injury/illness/exacerbation    Comorbidities ALND, radiation    Examination-Activity Limitations Dressing    Stability/Clinical Decision Making Stable/Uncomplicated    Clinical Decision Making Low    Rehab Potential Good    PT Frequency 2x / week    PT Duration 8 weeks    PT Treatment/Interventions ADLs/Self Care Home Management;Manual lymph drainage;Patient/family education;Taping;Compression bandaging;Therapeutic exercise  PT Next Visit Plan how was bandaging? remeasure forearm, start CDT left UE to include MLD and bandaging    PT Home Exercise Plan pump, night sleeve, self bandaging    Consulted and Agree with Plan of Care Patient;Family member/caregiver           Patient will benefit from skilled therapeutic intervention in order to improve the following deficits and impairments:  Decreased knowledge of use of DME,Increased edema,Decreased skin integrity  Visit Diagnosis: Postmastectomy lymphedema  Abnormal posture     Problem List Patient Active Problem List   Diagnosis Date Noted  . Lung nodules 10/11/2019  . Viral syndrome 10/11/2019  . Abnormal chest CT 12/27/2018  . Obesity hypoventilation syndrome ([place]) 10/26/2018  . Seasonal allergic conjunctivitis 06/17/2017  . Medicare annual wellness visit, initial 06/08/2014  . Anemia 11/24/2013  . Breast cancer of upper-outer quadrant of left female breast ([place]) 04/07/2013  . S/P TKR (total knee replacement) 03/21/2013  . Sleep disturbance, unspecified 03/21/2013  . Abnormal LFTs 03/21/2013  . OA (osteoarthritis) of knee 12/20/2012  . Degenerative joint disease of knee 11/05/2012  . Diabetes  mellitus, type 2 ([place]) 11/05/2012  . Adjustment disorder 03/13/2012  . Neuritis 07/28/2011  . Obstructive sleep apnea 05/28/2009  . Malignant neoplasm of female breast ([place]) 04/07/2009  . RHINITIS, CHRONIC 02/21/2009  . HYPERLIPIDEMIA 02/15/2007  . Essential hypertension 02/15/2007  . Allergic rhinitis 02/15/2007  . GERD 02/15/2007  . OSTEOARTHRITIS 02/15/2007  . Impaired fasting glucose 02/15/2007    Stark Bray 12/21/2020, 11:02 AM  [place], [place], [address] Phone: [phone]   Fax:  [phone]  Name: YURITZY ZEHRING MRN: [id number] Date of Birth: 1945-10-22

## 2020-12-22 NOTE — Progress Notes (Signed)
HPI female never smoker here with husband. History OSA/CPAP, complicated by history breast cancer, HBP, allergic rhinitis NPSG 05/06/19 AHI 47.7/hr,   -----------------------------------------------------------------------------------------------------   10/11/2019- 75 year old female never smoker followed for history OSA/CPAP, complicated by history breast cancer, HBP, allergic rhinitis, DM 2  cpap auto 5-15/ [facility] -----f/u OSA . breathing is at patient's baseline. CT [facility] for lung nodules/ hx breast cancer>> Report from 03/2019 scanned- stable 5-6 mm nodules bilat, radiation fibrosis. CPAP working well- no concerns. Describes a covid-like illness in Feb, 2020 after attending a large meeting- flu symptoms, cough, myalgias, fever, lost taste and smell. Tested neg flu. Never covid tested and would like to be.   Download compliance 100%, AHI 1.1/ hr Body weight today 194 lbs Trazodone 50,  CXR 12/13/2018-  IMPRESSION: 1. Stable cardiomegaly and chronic interstitial changes. 2.  Aortic Atherosclerosis (ICD10-I70.0).  12/24/20- 75 year old female never smoker followed for history OSA/CPAP, Breast Cancer L, HTN, allergicRrhinitis,  DM 2, GERD,   CPAP auto 5-15/ [facility] Download- compliance 100%, AHI 2.1/ hr Body weight today-193 lbs Covid vax-3 Phizer Flu vax-had -----Patient wants to see about getting antibiotic for sinus infection, and had CT scan done at [facility] from [facility] 12/04/2020- Stable small nodules and radiation changes.  No cancer recurrence or mets.  ROS-see HPI     + = positive Constitutional:   No-   weight loss, night sweats, + fevers, chills, fatigue, lassitude. HEENT:   + headaches, difficulty swallowing, tooth/dental problems, sore throat,       No-  sneezing, +itching, ear ache, +nasal congestion, +post nasal drip,  CV:  No-   chest pain, orthopnea, PND, swelling in lower extremities, anasarca, dizziness, palpitations Resp: No-   shortness of breath with  exertion or at rest.            productive cough,  No non-productive cough,  No- coughing up of blood.           change in color of mucus.  No- wheezing.   Skin: No-   rash or lesions. GI:  No-   heartburn, indigestion, abdominal pain, nausea, vomiting,  GU:  MS:  + joint pain or swelling.   Neuro-     nothing unusual Psych:  No- change in mood or affect. No depression or anxiety.  No memory loss.  OBJ- Physical Exam    General- Alert, Oriented, Affect-appropriate, Distress- none acute, + overweight Skin- rash-none, lesions- none, excoriation- none Lymphadenopathy- none Head- atraumatic            Eyes- Gross vision intact, PERRLA, conjunctivae and secretions clear,  + mild proptosis            Ears- Hearing, canals-normal            Nose- + prominent turbinates, Septal dev +mild, no-mucus, polyps, erosion, perforation             Throat- Mallampati III-IV, mucosa clear , drainage- none, tonsils- atrophic, own teeth Neck- flexible , trachea midline, no stridor , thyroid nl, carotid no bruit Chest - symmetrical excursion , unlabored           Heart/CV- RRR , no murmur , no gallop  , no rub, nl s1 s2                           - JVD- none , edema- none, stasis changes- none, varices- none  Lung- clear, wheeze- none, cough- none , dullness-none, rub- none           Chest wall-  Abd-  Br/ Gen/ Rectal- Not done, not indicated Extrem- cyanosis- none, clubbing, none, atrophy- none, strength- nl Neuro- grossly intact to observation

## 2020-12-24 ENCOUNTER — Encounter: Payer: Self-pay | Admitting: Internal Medicine

## 2020-12-24 ENCOUNTER — Other Ambulatory Visit: Payer: Self-pay

## 2020-12-24 ENCOUNTER — Ambulatory Visit: Payer: Medicare Other | Admitting: Internal Medicine

## 2020-12-24 VITALS — BP 130/68 | HR 64 | Temp 97.6°F | Ht 64.0 in | Wt 193.0 lb

## 2020-12-24 DIAGNOSIS — G4733 Obstructive sleep apnea (adult) (pediatric): Secondary | ICD-10-CM

## 2020-12-24 DIAGNOSIS — J310 Chronic rhinitis: Secondary | ICD-10-CM | POA: Diagnosis not present

## 2020-12-24 MED ORDER — AZELASTINE HCL 0.1 % NA SOLN
NASAL | 5 refills | Status: DC
Start: 2020-12-24 — End: 2020-12-24

## 2020-12-24 MED ORDER — AZELASTINE HCL 0.1 % NA SOLN: NASAL | 5 refills | Status: DC

## 2020-12-24 MED ORDER — AZITHROMYCIN 250 MG PO TABS: ORAL_TABLET | ORAL | 0 refills | Status: DC

## 2020-12-24 NOTE — Patient Instructions (Signed)
Order DME [employer]- please refit mask of choice, continue auto 5-15, humidifier, suppliers, [employer]. Card  Script sent for [employer] to hold for trip

## 2020-12-31 ENCOUNTER — Ambulatory Visit: Payer: Medicare Other

## 2020-12-31 ENCOUNTER — Other Ambulatory Visit: Payer: Self-pay

## 2020-12-31 DIAGNOSIS — R293 Abnormal posture: Secondary | ICD-10-CM

## 2020-12-31 DIAGNOSIS — I972 Postmastectomy lymphedema syndrome: Secondary | ICD-10-CM | POA: Diagnosis not present

## 2020-12-31 DIAGNOSIS — M6281 Muscle weakness (generalized): Secondary | ICD-10-CM

## 2020-12-31 NOTE — Therapy (Signed)
[place], [place], [address] Phone: [phone]   Fax:  [phone]  Physical Therapy Treatment  Patient Details  Name: Chelsey [name] MRN: [id number] Date of Birth: [birth year] Referring Provider (PT): Magda Kiel, [place]   Encounter Date: 12/31/2020   PT End of Session - 12/31/20 1714    Visit Number 2    Number of Visits 17    Date for PT Re-Evaluation 02/15/21    Authorization Type MCR    PT Start Time 1506    PT Stop Time 1600    PT Time Calculation (min) 54 min    Activity Tolerance Patient tolerated treatment well    Behavior During Therapy [facility] for tasks assessed/performed           Past Medical History:  Diagnosis Date  . Allergy   . Aphthous ulcer of mouth 2010   related to chemotherapy   . Breast cancer ([place])   . Carpal tunnel syndrome of right wrist   . Colitis   . Colon polyp   . Diabetes mellitus   . Diverticulosis   . GERD (gastroesophageal reflux disease)   . Hyperlipidemia   . Hypertension   . Lymphedema 08-31-13   left arm  . Osteoarthritis   . Pneumonia 07/28/2011  . Sinusitis   . Sleep apnea    setting of 10  . Squamous cell carcinoma    left leg    Past Surgical History:  Procedure Laterality Date  . AUGMENTATION MAMMAPLASTY Left   . CESAREAN SECTION     x 3  . MASTECTOMY Left 11/23   2010  . RECONSTRUCTION BREAST IMMEDIATE / DELAYED W/ TISSUE EXPANDER Left   . REDUCTION MAMMAPLASTY Right   . SKIN GRAFT    . SQUAMOUS CELL CARCINOMA EXCISION Left    Left lower extremity  . TONSILLECTOMY     as child  . TOTAL KNEE ARTHROPLASTY Left 12/20/2012   Procedure: TOTAL KNEE ARTHROPLASTY;  Surgeon: Gearlean Alf, MD;  Location: [facility];  Service: Orthopedics;  Laterality: Left;  . TOTAL KNEE ARTHROPLASTY Right 09/12/2013   Procedure: RIGHT TOTAL KNEE ARTHROPLASTY;  Surgeon: Gearlean Alf, MD;  Location: [facility];  Service: Orthopedics;  Laterality: Right;  .  TUBAL LIGATION      There were no vitals filed for this visit.   Subjective Assessment - 12/31/20 1503    Subjective I bandaged about 3 days last week and I do think they helped to decrease the swelling, but I tried the sleeve again and its too tight above my elbow and I think it makes my forearm swell more. Have tried to wear the Reid sleeve some at night.  I have not used my pump at all lately.  the sleeve is really hard to get on, but we got it on, but it has never fit correctly and it makes a ridge just above my elbow and is really tight at my wrist so the forearm blows up. My husband really needs to practice the wrapping    Pertinent History Left mastectomy with implant in 2010 due to ER/PR+ breast cancer. Completed chemotherapy and radiation, now on arimidex.  Previous therapy here for lymphedema, has pump using 1 hour per day, has too small sleeve.  Has a reid sleeve for night.    Patient Stated Goals Can I get the arm down    Currently in Pain? No/denies  LYMPHEDEMA/ONCOLOGY QUESTIONNAIRE - 12/31/20 0001      Left Upper Extremity Lymphedema   10 cm Proximal to Olecranon Process 35.5 cm    Olecranon Process 33 cm    15 cm Proximal to Ulnar Styloid Process 33.5 cm    10 cm Proximal to Ulnar Styloid Process 29.3 cm                      [facility] Adult PT Treatment/Exercise - 12/31/20 0001      Manual Therapy   Manual Therapy Edema management    Manual Lymphatic Drainage (MLD) short neck, Right axillary LN, left Inguinal LN, anterior interaxillary pathway, left axillo inguinal pathway, left lateral upper arm, medial to lateral, and lateral upper arm retracing pathways and then left forearm both sides retracing all steps and ending with left inguinal LN and right axillary LN.    Compression Bandaging Pt wrapped with stockinette, 6 cm to hand, 8 cm wap to forearm, and 2-10 cm wraps wrost to axilla.  Pt declined having fingers wrapped and knows to remove if  she develops any swelling in her hand                       PT Long Term Goals - 12/21/20 1057      PT LONG TERM GOAL #1   Title Pt will be able to manage her lymphedema at home with use of elevation exercise, compression garments and compression pump    Time 8    Period Weeks    Status New      PT LONG TERM GOAL #2   Title Pt will decrease volume of Lt UE by at least 134ml    Baseline 3610    Time 8    Period Weeks    Status New      PT LONG TERM GOAL #3   Title Pt will have decrease in left UE at 10 cm proximal to ulnar styloid by 2 cm    Baseline 29.7    Time 8    Period Weeks    Status New                 Plan - 12/31/20 1715    Clinical Impression Statement Pt reports she tried wearing the sleeve several times last week and she was able to get it on but feels that it makes her forearm swell more because it is too tight at the wrist and just above the elbow pushing more into the forearm.  It was suggested that she really tried to keep arm bandaged consistently so we can see if her arm will come down.  She indicated that it is hard for her to find clothes to wear that she can fit over the wraps because she is a [employer]. Advised to do the best she can to have her husband rewrap as much as possible.  She was going to try and get another appt for this week, and I suggested she bring her husband to watch wrapping again.  also suggested she use her pump if she is out of the wraps.    Personal Factors and Comorbidities Age;Comorbidity 2;Past/Current Experience;Fitness;Time since onset of injury/illness/exacerbation    Comorbidities ALND, radiation    Examination-Activity Limitations Dressing    Stability/Clinical Decision Making Stable/Uncomplicated    Rehab Potential Good    PT Frequency 2x / week    PT Duration 8 weeks    PT Treatment/Interventions ADLs/Self  Care Home Management;Manual lymph drainage;Patient/family education;Taping;Compression  bandaging;Therapeutic exercise    PT Next Visit Plan Pt. forgot sleeve today ( it is on Ellington Cornia's desk)how  much is she  bandaging? remeasure forearm, start CDT left UE to include MLD and bandaging. consider rosidal under wraps , MLD across back because hse notes she feels swelling there.   PT Home Exercise Plan pump, night sleeve, self bandaging    Consulted and Agree with Plan of Care Patient;Family member/caregiver           Patient will benefit from skilled therapeutic intervention in order to improve the following deficits and impairments:  Decreased knowledge of use of DME,Increased edema,Decreased skin integrity  Visit Diagnosis: Postmastectomy lymphedema  Abnormal posture  Muscle weakness (generalized)     Problem List Patient Active Problem List   Diagnosis Date Noted  . Lung nodules 10/11/2019  . Viral syndrome 10/11/2019  . Abnormal chest CT 12/27/2018  . Obesity hypoventilation syndrome ([place]) 10/26/2018  . Seasonal allergic conjunctivitis 06/17/2017  . Medicare annual wellness visit, initial 06/08/2014  . Anemia 11/24/2013  . Breast cancer of upper-outer quadrant of left female breast ([place]) 04/07/2013  . S/P TKR (total knee replacement) 03/21/2013  . Sleep disturbance, unspecified 03/21/2013  . Abnormal LFTs 03/21/2013  . OA (osteoarthritis) of knee 12/20/2012  . Degenerative joint disease of knee 11/05/2012  . Diabetes mellitus, type 2 ([place]) 11/05/2012  . Adjustment disorder 03/13/2012  . Neuritis 07/28/2011  . Obstructive sleep apnea 05/28/2009  . Malignant neoplasm of female breast ([place]) 04/07/2009  . RHINITIS, CHRONIC 02/21/2009  . HYPERLIPIDEMIA 02/15/2007  . Essential hypertension 02/15/2007  . Allergic rhinitis 02/15/2007  . GERD 02/15/2007  . OSTEOARTHRITIS 02/15/2007  . Impaired fasting glucose 02/15/2007    Claris Pong 12/31/2020, 5:20 PM  [place], [place],  [address] Phone: [phone]   Fax:  [phone]  Name: PATRICK SOHM MRN: [id number] Date of Birth: 07-18-1946  Cheral Almas, PT 12/31/20 5:22 PM

## 2021-01-02 ENCOUNTER — Ambulatory Visit: Payer: Medicare Other

## 2021-01-02 ENCOUNTER — Other Ambulatory Visit: Payer: Self-pay

## 2021-01-02 DIAGNOSIS — I972 Postmastectomy lymphedema syndrome: Secondary | ICD-10-CM | POA: Diagnosis not present

## 2021-01-02 DIAGNOSIS — M6281 Muscle weakness (generalized): Secondary | ICD-10-CM | POA: Diagnosis not present

## 2021-01-02 DIAGNOSIS — R293 Abnormal posture: Secondary | ICD-10-CM | POA: Diagnosis not present

## 2021-01-02 NOTE — Therapy (Signed)
[place], [place], [address] Phone: [phone]   Fax:  [phone]  Physical Therapy Treatment  Patient Details  Name: Chelsey [name] MRN: [id number] Date of Birth: 1945/11/05 Referring Provider (PT): Magda Kiel, [place]   Encounter Date: 01/02/2021   PT End of Session - 01/02/21 1107    Visit Number 3    Number of Visits 17    Date for PT Re-Evaluation 02/15/21    Authorization Type MCR    Progress Note Due on Visit 10    PT Start Time 1007    PT Stop Time 1106    PT Time Calculation (min) 59 min    Activity Tolerance Patient tolerated treatment well    Behavior During Therapy [facility] for tasks assessed/performed           Past Medical History:  Diagnosis Date  . Allergy   . Aphthous ulcer of mouth 2010   related to chemotherapy   . Breast cancer ([place])   . Carpal tunnel syndrome of right wrist   . Colitis   . Colon polyp   . Diabetes mellitus   . Diverticulosis   . GERD (gastroesophageal reflux disease)   . Hyperlipidemia   . Hypertension   . Lymphedema 08-31-13   left arm  . Osteoarthritis   . Pneumonia 07/28/2011  . Sinusitis   . Sleep apnea    setting of 10  . Squamous cell carcinoma    left leg    Past Surgical History:  Procedure Laterality Date  . AUGMENTATION MAMMAPLASTY Left   . CESAREAN SECTION     x 3  . MASTECTOMY Left 11/23   2010  . RECONSTRUCTION BREAST IMMEDIATE / DELAYED W/ TISSUE EXPANDER Left   . REDUCTION MAMMAPLASTY Right   . SKIN GRAFT    . SQUAMOUS CELL CARCINOMA EXCISION Left    Left lower extremity  . TONSILLECTOMY     as child  . TOTAL KNEE ARTHROPLASTY Left 12/20/2012   Procedure: TOTAL KNEE ARTHROPLASTY;  Surgeon: Gearlean Alf, MD;  [facility];  Service: Orthopedics;  Laterality: Left;  . TOTAL KNEE ARTHROPLASTY Right 09/12/2013   Procedure: RIGHT TOTAL KNEE ARTHROPLASTY;  Surgeon: Gearlean Alf, MD;  [facility];  Service:  Orthopedics;  Laterality: Right;  . TUBAL LIGATION      There were no vitals filed for this visit.   Subjective Assessment - 01/02/21 1305    Subjective We leave for [place] next month so I want to make sure we get the compression bandaging down.    Pertinent History Left mastectomy with implant in 2010 due to ER/PR+ breast cancer. Completed chemotherapy and radiation, now on arimidex.  Previous therapy here for lymphedema, has pump using 1 hour per day, has too small sleeve.  Has a reid sleeve for night.    Patient Stated Goals Can I get the arm down    Currently in Pain? No/denies                             [facility] Adult PT Treatment/Exercise - 01/02/21 0001      Manual Therapy   Compression Bandaging Spent session instructing pts husband in compression bandaging of Lt UE as follows: thin stockinette, Elastomull to fingers 1-4, Artiflex to hand and arm, then 1-6 cm to hand, 1-8 cm and 2-10 cm short stretch compression bandages to arm. Performed each layer for husband,  removed and had him return demo of each layer.                  PT Education - 01/02/21 1311    Education Details Compression bandaging    Person(s) Educated Patient;Spouse    Methods Explanation;Demonstration;Handout    Comprehension Verbalized understanding;Returned demonstration;Need further instruction               PT Long Term Goals - 12/21/20 1057      PT LONG TERM GOAL #1   Title Pt will be able to manage her lymphedema at home with use of elevation exercise, compression garments and compression pump    Time 8    Period Weeks    Status New      PT LONG TERM GOAL #2   Title Pt will decrease volume of Lt UE by at least 175ml    Baseline 3610    Time 8    Period Weeks    Status New      PT LONG TERM GOAL #3   Title Pt will have decrease in left UE at 10 cm proximal to ulnar styloid by 2 cm    Baseline 29.7    Time 8    Period Weeks    Status New                  Plan - 01/02/21 1306    Clinical Impression Statement Spent entire session educating pt and husband on proper compression bandaging of pts Lt UE. I would apply a layer explaining, then have him return demo for each layer until entire arm bandaged. Also included pt in instruction as she needs to provide feedback for comfort/tightness of bandages throughout. Pts huband did very well with instruction today and advised them to practice this again tonight and each day until next session next week. Also pt is worried about flying with bandages as she would like to but is worried security would make her remove them. So suggested she speak with her doctor to see if they will give her a MD note and to call [employer] to see if she would be allowed to wear them through security. Pt verbalized understanding.    Personal Factors and Comorbidities Age;Comorbidity 2;Past/Current Experience;Fitness;Time since onset of injury/illness/exacerbation    Comorbidities ALND, radiation    Examination-Activity Limitations Dressing    Stability/Clinical Decision Making Stable/Uncomplicated    Rehab Potential Good    PT Frequency 2x / week    PT Duration 8 weeks    PT Treatment/Interventions ADLs/Self Care Home Management;Manual lymph drainage;Patient/family education;Taping;Compression bandaging;Therapeutic exercise    PT Next Visit Plan Review bandagin giwth her husband if he returns with pt/how is this going at home? Continue bandaging and start manual lymph drainage once bandaging education complete, consider adding Rosidal under wraps    PT Home Exercise Plan pump, night sleeve, self bandaging    Consulted and Agree with Plan of Care Patient;Family member/caregiver    Family Member Consulted husband           Patient will benefit from skilled therapeutic intervention in order to improve the following deficits and impairments:  Decreased knowledge of use of DME,Increased edema,Decreased skin  integrity  Visit Diagnosis: Postmastectomy lymphedema  Abnormal posture     Problem List Patient Active Problem List   Diagnosis Date Noted  . Lung nodules 10/11/2019  . Viral syndrome 10/11/2019  . Abnormal chest CT 12/27/2018  . Obesity hypoventilation syndrome ([place]) 10/26/2018  .  Seasonal allergic conjunctivitis 06/17/2017  . Medicare annual wellness visit, initial 06/08/2014  . Anemia 11/24/2013  . Breast cancer of upper-outer quadrant of left female breast ([place]) 04/07/2013  . S/P TKR (total knee replacement) 03/21/2013  . Sleep disturbance, unspecified 03/21/2013  . Abnormal LFTs 03/21/2013  . OA (osteoarthritis) of knee 12/20/2012  . Degenerative joint disease of knee 11/05/2012  . Diabetes mellitus, type 2 ([place]) 11/05/2012  . Adjustment disorder 03/13/2012  . Neuritis 07/28/2011  . Obstructive sleep apnea 05/28/2009  . Malignant neoplasm of female breast ([place]) 04/07/2009  . RHINITIS, CHRONIC 02/21/2009  . HYPERLIPIDEMIA 02/15/2007  . Essential hypertension 02/15/2007  . Allergic rhinitis 02/15/2007  . GERD 02/15/2007  . OSTEOARTHRITIS 02/15/2007  . Impaired fasting glucose 02/15/2007    Otelia Limes, PTA 01/02/2021, 1:12 PM  [place], [place], [address] Phone: [phone]   Fax:  [phone]  Name: MARVELYN BOUCHILLON MRN: [id number] Date of Birth: 12-19-1945

## 2021-01-08 ENCOUNTER — Ambulatory Visit: Payer: Medicare Other | Admitting: Rehabilitation

## 2021-01-08 ENCOUNTER — Encounter: Payer: Self-pay | Admitting: Rehabilitation

## 2021-01-08 ENCOUNTER — Other Ambulatory Visit: Payer: Self-pay

## 2021-01-08 DIAGNOSIS — M6281 Muscle weakness (generalized): Secondary | ICD-10-CM | POA: Diagnosis not present

## 2021-01-08 DIAGNOSIS — I972 Postmastectomy lymphedema syndrome: Secondary | ICD-10-CM

## 2021-01-08 DIAGNOSIS — R293 Abnormal posture: Secondary | ICD-10-CM

## 2021-01-08 NOTE — Addendum Note (Signed)
Addended by: Stark Bray on: 01/08/2021 05:33 PM   Modules accepted: Orders

## 2021-01-08 NOTE — Therapy (Signed)
[place], [place], [address] Phone: [phone]   Fax:  [phone]  Physical Therapy Treatment  Patient Details  Name: Chelsey [name] MRN: [id number] Date of Birth: 01-May-1946 Referring Provider (PT): Magda Kiel, [place]   Encounter Date: 01/08/2021   PT End of Session - 01/08/21 1054    Visit Number 4    Number of Visits 17    Date for PT Re-Evaluation 02/15/21    PT Start Time 1001    PT Stop Time 1055    PT Time Calculation (min) 54 min    Activity Tolerance Patient tolerated treatment well    Behavior During Therapy [facility] for tasks assessed/performed           Past Medical History:  Diagnosis Date  . Allergy   . Aphthous ulcer of mouth 2010   related to chemotherapy   . Breast cancer ([place])   . Carpal tunnel syndrome of right wrist   . Colitis   . Colon polyp   . Diabetes mellitus   . Diverticulosis   . GERD (gastroesophageal reflux disease)   . Hyperlipidemia   . Hypertension   . Lymphedema 08-31-13   left arm  . Osteoarthritis   . Pneumonia 07/28/2011  . Sinusitis   . Sleep apnea    setting of 10  . Squamous cell carcinoma    left leg    Past Surgical History:  Procedure Laterality Date  . AUGMENTATION MAMMAPLASTY Left   . CESAREAN SECTION     x 3  . MASTECTOMY Left 11/23   2010  . RECONSTRUCTION BREAST IMMEDIATE / DELAYED W/ TISSUE EXPANDER Left   . REDUCTION MAMMAPLASTY Right   . SKIN GRAFT    . SQUAMOUS CELL CARCINOMA EXCISION Left    Left lower extremity  . TONSILLECTOMY     as child  . TOTAL KNEE ARTHROPLASTY Left 12/20/2012   Procedure: TOTAL KNEE ARTHROPLASTY;  Surgeon: Gearlean Alf, MD;  [facility];  Service: Orthopedics;  Laterality: Left;  . TOTAL KNEE ARTHROPLASTY Right 09/12/2013   Procedure: RIGHT TOTAL KNEE ARTHROPLASTY;  Surgeon: Gearlean Alf, MD;  [facility];  Service: Orthopedics;  Laterality: Right;  . TUBAL LIGATION      There  were no vitals filed for this visit.   Subjective Assessment - 01/08/21 1001    Subjective Leave 4/14 for [place]    Pertinent History Left mastectomy with implant in 2010 due to ER/PR+ breast cancer. Completed chemotherapy and radiation, now on arimidex.  Previous therapy here for lymphedema, has pump using 1 hour per day, has too small sleeve.  Has a reid sleeve for night.    Currently in Pain? No/denies                 LYMPHEDEMA/ONCOLOGY QUESTIONNAIRE - 01/08/21 0001      Left Upper Extremity Lymphedema   10 cm Proximal to Olecranon Process 36.5 cm (P)     Olecranon Process 32.3 cm (P)     10 cm Proximal to Ulnar Styloid Process 29.6 cm (P)     Just Proximal to Ulnar Styloid Process 20.5 cm (P)     Across Hand at [employer] 20.5 cm (P)     At [facility] of 2nd Digit 7 cm (P)                       [facility] Adult PT Treatment/Exercise - 01/08/21 0001  Manual Therapy   Compression Bandaging Spent session instructing pts husband in compression bandaging of Lt UE as follows: thin stockinette, rosidal 15cm added,  then 1-6 cm to hand, 1-8 cm and 2-10 cm short stretch compression bandages to arm. Performed each layer for husband, removed and had him return demo of each layer. Pt left with wrap applied by husband.                       PT Long Term Goals - 12/21/20 1057      PT LONG TERM GOAL #1   Title Pt will be able to manage her lymphedema at home with use of elevation exercise, compression garments and compression pump    Time 8    Period Weeks    Status New      PT LONG TERM GOAL #2   Title Pt will decrease volume of Lt UE by at least 168ml    Baseline 3610    Time 8    Period Weeks    Status New      PT LONG TERM GOAL #3   Title Pt will have decrease in left UE at 10 cm proximal to ulnar styloid by 2 cm    Baseline 29.7    Time 8    Period Weeks    Status New                 Plan - 01/08/21 1055    Clinical Impression  Statement Measurements are overall unchanged in the forearm with pt and husband bandaging intermittently and using reid sleeve at night.  Pt also has not used pump.  Added rosidal foam to UE to increase intensity of bandaing.  Husband demonstrated good technique with switch to rosidal.  Pt enouraged to use pump QD and wrap as much as possible.  Showed pt velcro garment which she may consider later but not for the trip    PT Frequency 2x / week    PT Duration 8 weeks    PT Treatment/Interventions ADLs/Self Care Home Management;Manual lymph drainage;Patient/family education;Taping;Compression bandaging;Therapeutic exercise    PT Next Visit Plan Review bandagin giwth her husband if he returns with pt/how is this going at home? Continue bandaging and start manual lymph drainage once bandaging education complete, consider adding Rosidal under wraps    Consulted and Agree with Plan of Care Patient;Family member/caregiver    Family Member Consulted husband           Patient will benefit from skilled therapeutic intervention in order to improve the following deficits and impairments:     Visit Diagnosis: Postmastectomy lymphedema  Abnormal posture  Muscle weakness (generalized)     Problem List Patient Active Problem List   Diagnosis Date Noted  . Lung nodules 10/11/2019  . Viral syndrome 10/11/2019  . Abnormal chest CT 12/27/2018  . Obesity hypoventilation syndrome ([place]) 10/26/2018  . Seasonal allergic conjunctivitis 06/17/2017  . Medicare annual wellness visit, initial 06/08/2014  . Anemia 11/24/2013  . Breast cancer of upper-outer quadrant of left female breast ([place]) 04/07/2013  . S/P TKR (total knee replacement) 03/21/2013  . Sleep disturbance, unspecified 03/21/2013  . Abnormal LFTs 03/21/2013  . OA (osteoarthritis) of knee 12/20/2012  . Degenerative joint disease of knee 11/05/2012  . Diabetes mellitus, type 2 ([place]) 11/05/2012  . Adjustment disorder 03/13/2012  . Neuritis  07/28/2011  . Obstructive sleep apnea 05/28/2009  . Malignant neoplasm of female breast ([place]) 04/07/2009  . RHINITIS,  CHRONIC 02/21/2009  . HYPERLIPIDEMIA 02/15/2007  . Essential hypertension 02/15/2007  . Allergic rhinitis 02/15/2007  . GERD 02/15/2007  . OSTEOARTHRITIS 02/15/2007  . Impaired fasting glucose 02/15/2007    Stark Bray 01/08/2021, 10:57 AM  [place], [place], [address] Phone: [phone]   Fax:  [phone]  Name: Chelsey [name] MRN: [id number] Date of Birth: Mar 07, 1946

## 2021-01-10 ENCOUNTER — Ambulatory Visit: Payer: Medicare Other | Admitting: Rehabilitation

## 2021-01-10 ENCOUNTER — Encounter: Payer: Self-pay | Admitting: Rehabilitation

## 2021-01-10 ENCOUNTER — Other Ambulatory Visit: Payer: Self-pay

## 2021-01-10 DIAGNOSIS — I972 Postmastectomy lymphedema syndrome: Secondary | ICD-10-CM

## 2021-01-10 DIAGNOSIS — M6281 Muscle weakness (generalized): Secondary | ICD-10-CM

## 2021-01-10 DIAGNOSIS — R293 Abnormal posture: Secondary | ICD-10-CM

## 2021-01-10 NOTE — Addendum Note (Signed)
Addended by: Stark Bray on: 01/10/2021 05:19 PM   Modules accepted: Orders

## 2021-01-10 NOTE — Therapy (Signed)
[place], [place], [address] Phone: [phone]   Fax:  [phone]  Physical Therapy Treatment  Patient Details  Name: Chelsey [name] MRN: [id number] Date of Birth: 06-27-1946 Referring Provider (PT): Magda Kiel, [place]   Encounter Date: 01/10/2021   PT End of Session - 01/10/21 1659    Visit Number 5    Number of Visits 17    Date for PT Re-Evaluation 02/15/21    Progress Note Due on Visit 10    PT Start Time 5277    PT Stop Time 1554    PT Time Calculation (min) 60 min    Activity Tolerance Patient tolerated treatment well    Behavior During Therapy [facility] for tasks assessed/performed           Past Medical History:  Diagnosis Date  . Allergy   . Aphthous ulcer of mouth 2010   related to chemotherapy   . Breast cancer ([place])   . Carpal tunnel syndrome of right wrist   . Colitis   . Colon polyp   . Diabetes mellitus   . Diverticulosis   . GERD (gastroesophageal reflux disease)   . Hyperlipidemia   . Hypertension   . Lymphedema 08-31-13   left arm  . Osteoarthritis   . Pneumonia 07/28/2011  . Sinusitis   . Sleep apnea    setting of 10  . Squamous cell carcinoma    left leg    Past Surgical History:  Procedure Laterality Date  . AUGMENTATION MAMMAPLASTY Left   . CESAREAN SECTION     x 3  . MASTECTOMY Left 11/23   2010  . RECONSTRUCTION BREAST IMMEDIATE / DELAYED W/ TISSUE EXPANDER Left   . REDUCTION MAMMAPLASTY Right   . SKIN GRAFT    . SQUAMOUS CELL CARCINOMA EXCISION Left    Left lower extremity  . TONSILLECTOMY     as child  . TOTAL KNEE ARTHROPLASTY Left 12/20/2012   Procedure: TOTAL KNEE ARTHROPLASTY;  Surgeon: Gearlean Alf, MD;  Location: [facility];  Service: Orthopedics;  Laterality: Left;  . TOTAL KNEE ARTHROPLASTY Right 09/12/2013   Procedure: RIGHT TOTAL KNEE ARTHROPLASTY;  Surgeon: Gearlean Alf, MD;  Location: [facility];  Service: Orthopedics;  Laterality: Right;   . TUBAL LIGATION      There were no vitals filed for this visit.   Subjective Assessment - 01/10/21 1449    Subjective I wore this sleeve (flat knit) to show you how it makes the wrist and elbow red    Pertinent History Left mastectomy with implant in 2010 due to ER/PR+ breast cancer. Completed chemotherapy and radiation, now on arimidex.  Previous therapy here for lymphedema, has pump using 1 hour per day, has too small sleeve.  Has a reid sleeve for night.    Patient Stated Goals Can I get the arm down    Currently in Pain? No/denies                             [facility] Adult PT Treatment/Exercise - 01/10/21 0001      Manual Therapy   Edema Management instructed to to try stretching wrist over sleeve over bottle and to return to compression bra with pt given 1/2" foam rectangle for left lateral chest    Manual Lymphatic Drainage (MLD) short neck, Right axillary LN, left Inguinal LN, anterior interaxillary pathway, left axillo inguinal pathway, left lateral upper arm,  medial to lateral, and lateral upper arm retracing pathways and then left forearm both sides retracing all steps and ending with left inguinal LN and right axillary LN. Then posterior interaxillary work in sidelying    Compression Bandaging applied lotion, stockinette, rosidal from hand to axilla, 16-cm bandage for the hand, 1 8cm bandage in herringbone to the forearm, and 2 8 cm bandages in spiral from wrist to axilla                       PT Long Term Goals - 12/21/20 1057      PT LONG TERM GOAL #1   Title Pt will be able to manage her lymphedema at home with use of elevation exercise, compression garments and compression pump    Time 8    Period Weeks    Status New      PT LONG TERM GOAL #2   Title Pt will decrease volume of Lt UE by at least 161ml    Baseline 3610    Time 8    Period Weeks    Status New      PT LONG TERM GOAL #3   Title Pt will have decrease in left UE at 10 cm  proximal to ulnar styloid by 2 cm    Baseline 29.7    Time 8    Period Weeks    Status New                 Plan - 01/10/21 1702    Clinical Impression Statement Pt arrived with redness and tight wrist circle from flat knit sleeve as well as tight red circle at the elbow.  Focused on self MLD to the left arm and lateral trunk with fibrosis work at the forearm with sustained pressure.  Pt was given a foam rectangle for the lateral trunk due to soft edema here and advice to return to compression bra as able.    PT Frequency 2x / week    PT Duration 8 weeks    PT Treatment/Interventions ADLs/Self Care Home Management;Manual lymph drainage;Patient/family education;Taping;Compression bandaging;Therapeutic exercise    PT Next Visit Plan try stretching the wrist? Review bandaging with her husband as needed,  Continue bandaging and manual lymph drainage once bandaging education complete,    Consulted and Agree with Plan of Care Patient           Patient will benefit from skilled therapeutic intervention in order to improve the following deficits and impairments:     Visit Diagnosis: Postmastectomy lymphedema  Abnormal posture  Muscle weakness (generalized)     Problem List Patient Active Problem List   Diagnosis Date Noted  . Lung nodules 10/11/2019  . Viral syndrome 10/11/2019  . Abnormal chest CT 12/27/2018  . Obesity hypoventilation syndrome ([place]) 10/26/2018  . Seasonal allergic conjunctivitis 06/17/2017  . Medicare annual wellness visit, initial 06/08/2014  . Anemia 11/24/2013  . Breast cancer of upper-outer quadrant of left female breast ([place]) 04/07/2013  . S/P TKR (total knee replacement) 03/21/2013  . Sleep disturbance, unspecified 03/21/2013  . Abnormal LFTs 03/21/2013  . OA (osteoarthritis) of knee 12/20/2012  . Degenerative joint disease of knee 11/05/2012  . Diabetes mellitus, type 2 ([place]) 11/05/2012  . Adjustment disorder 03/13/2012  . Neuritis 07/28/2011   . Obstructive sleep apnea 05/28/2009  . Malignant neoplasm of female breast ([place]) 04/07/2009  . RHINITIS, CHRONIC 02/21/2009  . HYPERLIPIDEMIA 02/15/2007  . Essential hypertension 02/15/2007  . Allergic rhinitis  02/15/2007  . GERD 02/15/2007  . OSTEOARTHRITIS 02/15/2007  . Impaired fasting glucose 02/15/2007    Stark Bray 01/10/2021, 5:07 PM  [place], [place], [address] Phone: [phone]   Fax:  [phone]  Name: Chelsey [name] MRN: [id number] Date of Birth: Jul 05, 1946

## 2021-01-11 ENCOUNTER — Other Ambulatory Visit (INDEPENDENT_AMBULATORY_CARE_PROVIDER_SITE_OTHER): Payer: Medicare Other

## 2021-01-11 DIAGNOSIS — E1165 Type 2 diabetes mellitus with hyperglycemia: Secondary | ICD-10-CM | POA: Diagnosis not present

## 2021-01-11 DIAGNOSIS — Z853 Personal history of malignant neoplasm of breast: Secondary | ICD-10-CM | POA: Diagnosis not present

## 2021-01-11 DIAGNOSIS — E785 Hyperlipidemia, unspecified: Secondary | ICD-10-CM

## 2021-01-11 DIAGNOSIS — Z79899 Other long term (current) drug therapy: Secondary | ICD-10-CM

## 2021-01-11 DIAGNOSIS — I10 Essential (primary) hypertension: Secondary | ICD-10-CM

## 2021-01-11 LAB — HEMOGLOBIN A1C: Hgb A1c MFr Bld: 7.6 % — ABNORMAL HIGH (ref 4.6–6.5)

## 2021-01-11 LAB — BASIC METABOLIC PANEL
BUN: 16 mg/dL (ref 6–23)
CO2: 27 mEq/L (ref 19–32)
Calcium: 10 mg/dL (ref 8.4–10.5)
Chloride: 101 mEq/L (ref 96–112)
Creatinine, Ser: 0.83 mg/dL (ref 0.40–1.20)
GFR: 69.78 mL/min (ref >60.00)
Glucose, Bld: 201 mg/dL — ABNORMAL HIGH (ref 70–99)
Potassium: 4.6 mEq/L (ref 3.5–5.1)
Sodium: 138 mEq/L (ref 135–145)

## 2021-01-11 LAB — LIPID PANEL
Cholesterol: 179 mg/dL (ref 0–200)
HDL: 60.4 mg/dL (ref >39.00)
LDL Cholesterol: 85 mg/dL (ref 0–99)
NonHDL: 118.49
Total CHOL/HDL Ratio: 3
Triglycerides: 169 mg/dL — ABNORMAL HIGH (ref 0.0–149.0)
VLDL: 33.8 mg/dL (ref 0.0–40.0)

## 2021-01-11 LAB — MICROALBUMIN / CREATININE URINE RATIO
Creatinine,U: 178.3 mg/dL
Microalb Creat Ratio: 1.3 mg/g (ref 0.0–30.0)
Microalb, Ur: 2.3 mg/dL — ABNORMAL HIGH (ref 0.0–1.9)

## 2021-01-11 LAB — TSH: TSH: 2.4 u[IU]/mL (ref 0.35–4.50)

## 2021-01-11 LAB — CBC WITH DIFFERENTIAL/PLATELET
Basophils Absolute: 0 10*3/uL (ref 0.0–0.1)
Basophils Relative: 0.9 % (ref 0.0–3.0)
Eosinophils Absolute: 0.1 10*3/uL (ref 0.0–0.7)
Eosinophils Relative: 2 % (ref 0.0–5.0)
HCT: 38.7 % (ref 36.0–46.0)
Hemoglobin: 13.4 g/dL (ref 12.0–15.0)
Lymphocytes Relative: 36 % (ref 12.0–46.0)
Lymphs Abs: 1.8 10*3/uL (ref 0.7–4.0)
MCHC: 34.7 g/dL (ref 30.0–36.0)
MCV: 89.2 fl (ref 78.0–100.0)
Monocytes Absolute: 0.4 10*3/uL (ref 0.1–1.0)
Monocytes Relative: 8.5 % (ref 3.0–12.0)
Neutro Abs: 2.6 10*3/uL (ref 1.4–7.7)
Neutrophils Relative %: 52.6 % (ref 43.0–77.0)
Platelets: 188 10*3/uL (ref 150.0–400.0)
RBC: 4.33 Mil/uL (ref 3.87–5.11)
RDW: 13.5 % (ref 11.5–15.5)
WBC: 4.9 10*3/uL (ref 4.0–10.5)

## 2021-01-11 LAB — HEPATIC FUNCTION PANEL
ALT: 17 U/L (ref 0–35)
AST: 17 U/L (ref 0–37)
Albumin: 4.6 g/dL (ref 3.5–5.2)
Alkaline Phosphatase: 48 U/L (ref 39–117)
Bilirubin, Direct: 0.2 mg/dL (ref 0.0–0.3)
Total Bilirubin: 0.8 mg/dL (ref 0.2–1.2)
Total Protein: 6.9 g/dL (ref 6.0–8.3)

## 2021-01-15 ENCOUNTER — Other Ambulatory Visit: Payer: Self-pay

## 2021-01-15 ENCOUNTER — Ambulatory Visit: Payer: Medicare Other | Attending: Physician Assistant

## 2021-01-15 DIAGNOSIS — R293 Abnormal posture: Secondary | ICD-10-CM | POA: Insufficient documentation

## 2021-01-15 DIAGNOSIS — I972 Postmastectomy lymphedema syndrome: Secondary | ICD-10-CM | POA: Diagnosis not present

## 2021-01-15 NOTE — Therapy (Signed)
[place], [place], [address] Phone: [phone]   Fax:  [phone]  Physical Therapy Treatment  Patient Details  Name: Chelsey [name] MRN: [id number] Date of Birth: 1946-01-25 Referring Provider (PT): Magda Kiel, [place]   Encounter Date: 01/15/2021   PT End of Session - 01/15/21 1004    Visit Number 6    Number of Visits 17    Date for PT Re-Evaluation 02/15/21    PT Start Time 0904    PT Stop Time 1002    PT Time Calculation (min) 58 min    Activity Tolerance Patient tolerated treatment well    Behavior During Therapy [facility] for tasks assessed/performed           Past Medical History:  Diagnosis Date  . Allergy   . Aphthous ulcer of mouth 2010   related to chemotherapy   . Breast cancer ([place])   . Carpal tunnel syndrome of right wrist   . Colitis   . Colon polyp   . Diabetes mellitus   . Diverticulosis   . GERD (gastroesophageal reflux disease)   . Hyperlipidemia   . Hypertension   . Lymphedema 08-31-13   left arm  . Osteoarthritis   . Pneumonia 07/28/2011  . Sinusitis   . Sleep apnea    setting of 10  . Squamous cell carcinoma    left leg    Past Surgical History:  Procedure Laterality Date  . AUGMENTATION MAMMAPLASTY Left   . CESAREAN SECTION     x 3  . MASTECTOMY Left 11/23   2010  . RECONSTRUCTION BREAST IMMEDIATE / DELAYED W/ TISSUE EXPANDER Left   . REDUCTION MAMMAPLASTY Right   . SKIN GRAFT    . SQUAMOUS CELL CARCINOMA EXCISION Left    Left lower extremity  . TONSILLECTOMY     as child  . TOTAL KNEE ARTHROPLASTY Left 12/20/2012   Procedure: TOTAL KNEE ARTHROPLASTY;  Surgeon: Gearlean Alf, MD;  [facility];  Service: Orthopedics;  Laterality: Left;  . TOTAL KNEE ARTHROPLASTY Right 09/12/2013   Procedure: RIGHT TOTAL KNEE ARTHROPLASTY;  Surgeon: Gearlean Alf, MD;  [facility];  Service: Orthopedics;  Laterality: Right;  . TUBAL LIGATION      There  were no vitals filed for this visit.   Subjective Assessment - 01/15/21 1011    Subjective I used my Flexitouch and my husband bandaged my arm last night. He is still working on getting the tension right with the bandaging.    Pertinent History Left mastectomy with implant in 2010 due to ER/PR+ breast cancer. Completed chemotherapy and radiation, now on arimidex.  Previous therapy here for lymphedema, has pump using 1 hour per day, has too small sleeve.  Has a reid sleeve for night.    Patient Stated Goals Can I get the arm down    Currently in Pain? No/denies                 LYMPHEDEMA/ONCOLOGY QUESTIONNAIRE - 01/15/21 0001      Left Upper Extremity Lymphedema   15 cm Proximal to Olecranon Process 35.6 cm    10 cm Proximal to Olecranon Process 33.5 cm    Olecranon Process 30.6 cm    15 cm Proximal to Ulnar Styloid Process 31.6 cm    10 cm Proximal to Ulnar Styloid Process 28.9 cm    Just Proximal to Ulnar Styloid Process 20.5 cm    Across Hand at Thumb  Web Space 19.7 cm    At [employer] of 2nd Digit 7.3 cm                      [facility] Adult PT Treatment/Exercise - 01/15/21 0001      Manual Therapy   Manual Lymphatic Drainage (MLD) short neck, 5 diaphragmatic breaths, Right axillary LN, left Inguinal LN, anterior interaxillary pathway, left axillo inguinal pathway, left lateral upper arm, medial to lateral, and lateral upper arm retracing pathways and then left forearm both sides retracing all steps and ending with left inguinal LN and right axillary LN    Compression Bandaging applied lotion, stockinette, rosidal from hand to axilla, 1 - 6-cm bandage for the hand, 1 - 8cm bandage with "X" at elbow, and 2 - 10 cm bandages from wrist to axilla, first done in herring bone fashion, 2nd in spiral verbally reviewing with husband while applying today                       PT Long Term Goals - 12/21/20 1057      PT LONG TERM GOAL #1   Title Pt will be able to manage  her lymphedema at home with use of elevation exercise, compression garments and compression pump    Time 8    Period Weeks    Status New      PT LONG TERM GOAL #2   Title Pt will decrease volume of Lt UE by at least 179ml    Baseline 3610    Time 8    Period Weeks    Status New      PT LONG TERM GOAL #3   Title Pt will have decrease in left UE at 10 cm proximal to ulnar styloid by 2 cm    Baseline 29.7    Time 8    Period Weeks    Status New                 Plan - 01/15/21 1004    Clinical Impression Statement Remeasured pts circumference and her Lt UE has reduced since last measured. She reports used her Flexitouch last night and then her husband bandaged her arm. Encouraged her to cont doing this daily as able. Also discussed during MLD today pt having arm bandaged for plane ride to [place]. She is worried they won't ket her through security with bandages on so determined it would be best for husband to bandage her arm once they get through security and to their gate before boarding. Both pt and husband agree. So this is plan for air travel. Continued with complete decongestive therapy and reviewed application of bandages with husband at end of session while therapist performed this. Pt scheduled follow up with [place] the week she returns from [place]. Anticipate renewing physical therapy at that time and she will need to be remeasured to see how her arm did for trip.    Personal Factors and Comorbidities Age;Comorbidity 2;Past/Current Experience;Fitness;Time since onset of injury/illness/exacerbation    Comorbidities ALND, radiation    Examination-Activity Limitations Dressing    Stability/Clinical Decision Making Stable/Uncomplicated    Rehab Potential Good    PT Frequency 2x / week    PT Duration 8 weeks    PT Treatment/Interventions ADLs/Self Care Home Management;Manual lymph drainage;Patient/family education;Taping;Compression bandaging;Therapeutic exercise    PT Next Visit  Plan tRemeasure circumference once pt returns, how did her arm do for the trip? ry stretching the wrist?  Review bandaging with her husband as needed,  Continue bandaging and manual lymph drainage once bandaging education complete,    PT Home Exercise Plan pump, night sleeve, self bandaging    Consulted and Agree with Plan of Care Patient           Patient will benefit from skilled therapeutic intervention in order to improve the following deficits and impairments:  Decreased knowledge of use of DME,Increased edema,Decreased skin integrity  Visit Diagnosis: Postmastectomy lymphedema  Abnormal posture     Problem List Patient Active Problem List   Diagnosis Date Noted  . Lung nodules 10/11/2019  . Viral syndrome 10/11/2019  . Abnormal chest CT 12/27/2018  . Obesity hypoventilation syndrome ([place]) 10/26/2018  . Seasonal allergic conjunctivitis 06/17/2017  . Medicare annual wellness visit, initial 06/08/2014  . Anemia 11/24/2013  . Breast cancer of upper-outer quadrant of left female breast ([place]) 04/07/2013  . S/P TKR (total knee replacement) 03/21/2013  . Sleep disturbance, unspecified 03/21/2013  . Abnormal LFTs 03/21/2013  . OA (osteoarthritis) of knee 12/20/2012  . Degenerative joint disease of knee 11/05/2012  . Diabetes mellitus, type 2 ([place]) 11/05/2012  . Adjustment disorder 03/13/2012  . Neuritis 07/28/2011  . Obstructive sleep apnea 05/28/2009  . Malignant neoplasm of female breast ([place]) 04/07/2009  . RHINITIS, CHRONIC 02/21/2009  . HYPERLIPIDEMIA 02/15/2007  . Essential hypertension 02/15/2007  . Allergic rhinitis 02/15/2007  . GERD 02/15/2007  . OSTEOARTHRITIS 02/15/2007  . Impaired fasting glucose 02/15/2007    Otelia Limes, PTA 01/15/2021, 10:13 AM  [place], [place], [address] Phone: [phone]   Fax:  [phone]  Name: LENEE FRANZE MRN: [id number] Date of  Birth: [birth year]

## 2021-01-16 NOTE — Progress Notes (Signed)
Blood sugar higher than last rest of labs normal or acceptable.  Will discuss elevated blood sugar A1c reading at upcoming visit.

## 2021-01-17 ENCOUNTER — Encounter: Payer: Self-pay | Admitting: Rehabilitation

## 2021-01-18 ENCOUNTER — Encounter: Payer: Medicare Other | Admitting: Internal Medicine

## 2021-01-20 ENCOUNTER — Other Ambulatory Visit: Payer: Self-pay | Admitting: Internal Medicine

## 2021-01-21 ENCOUNTER — Encounter: Payer: Self-pay | Admitting: Internal Medicine

## 2021-01-21 ENCOUNTER — Other Ambulatory Visit: Payer: Self-pay | Admitting: Internal Medicine

## 2021-01-21 ENCOUNTER — Other Ambulatory Visit: Payer: Self-pay

## 2021-01-21 ENCOUNTER — Ambulatory Visit (INDEPENDENT_AMBULATORY_CARE_PROVIDER_SITE_OTHER): Payer: Medicare Other | Admitting: Internal Medicine

## 2021-01-21 VITALS — BP 110/60 | HR 65 | Temp 98.2°F | Ht 63.0 in | Wt 184.2 lb

## 2021-01-21 DIAGNOSIS — E1165 Type 2 diabetes mellitus with hyperglycemia: Secondary | ICD-10-CM

## 2021-01-21 DIAGNOSIS — Z0000 Encounter for general adult medical examination without abnormal findings: Secondary | ICD-10-CM

## 2021-01-21 DIAGNOSIS — E785 Hyperlipidemia, unspecified: Secondary | ICD-10-CM | POA: Diagnosis not present

## 2021-01-21 DIAGNOSIS — Z0001 Encounter for general adult medical examination with abnormal findings: Secondary | ICD-10-CM

## 2021-01-21 DIAGNOSIS — I10 Essential (primary) hypertension: Secondary | ICD-10-CM | POA: Diagnosis not present

## 2021-01-21 DIAGNOSIS — Z853 Personal history of malignant neoplasm of breast: Secondary | ICD-10-CM | POA: Diagnosis not present

## 2021-01-21 DIAGNOSIS — Z79899 Other long term (current) drug therapy: Secondary | ICD-10-CM

## 2021-01-21 DIAGNOSIS — G4733 Obstructive sleep apnea (adult) (pediatric): Secondary | ICD-10-CM

## 2021-01-21 DIAGNOSIS — Z9989 Dependence on other enabling machines and devices: Secondary | ICD-10-CM

## 2021-01-21 MED ORDER — DAPAGLIFLOZIN PROPANEDIOL 5 MG PO TABS: 5.0000 mg | ORAL_TABLET | Freq: Every day | ORAL | 3 refills | Status: DC

## 2021-01-21 MED ORDER — ALPRAZOLAM 0.25 MG PO TABS: ORAL_TABLET | ORAL | 0 refills | Status: DC

## 2021-01-21 NOTE — Patient Instructions (Addendum)
Continue   efforts  For  lifestyle intervention healthy eating and exercise .   ldl goal 70 range   Add medication.   Kerry Dory. After you get back.   Healthy weight loss.   Can get  Pneumovax   When you get back or next visit  Shingles vaccine at  Your pharmacy.     Plan   Hg a1c in 4 months and fu then .     Health Maintenance, Female Adopting a healthy lifestyle and getting preventive care are important in promoting health and wellness. Ask your health care provider about:  The right schedule for you to have regular tests and exams.  Things you can do on your own to prevent diseases and keep yourself healthy. What should I know about diet, weight, and exercise? Eat a healthy diet  Eat a diet that includes plenty of vegetables, fruits, low-fat dairy products, and lean protein.  Do not eat a lot of foods that are high in solid fats, added sugars, or sodium.   Maintain a healthy weight Body mass index (BMI) is used to identify weight problems. It estimates body fat based on height and weight. Your health care provider can help determine your BMI and help you achieve or maintain a healthy weight. Get regular exercise Get regular exercise. This is one of the most important things you can do for your health. Most adults should:  Exercise for at least 150 minutes each week. The exercise should increase your heart rate and make you sweat (moderate-intensity exercise).  Do strengthening exercises at least twice a week. This is in addition to the moderate-intensity exercise.  Spend less time sitting. Even light physical activity can be beneficial. Watch cholesterol and blood lipids Have your blood tested for lipids and cholesterol at 74 years of age, then have this test every 5 years. Have your cholesterol levels checked more often if:  Your lipid or cholesterol levels are high.  You are older than 74 years of age.  You are at high risk for heart disease. What should I  know about cancer screening? Depending on your health history and family history, you may need to have cancer screening at various ages. This may include screening for:  Breast cancer.  Cervical cancer.  Colorectal cancer.  Skin cancer.  Lung cancer. What should I know about heart disease, diabetes, and high blood pressure? Blood pressure and heart disease  High blood pressure causes heart disease and increases the risk of stroke. This is more likely to develop in people who have high blood pressure readings, are of African descent, or are overweight.  Have your blood pressure checked: ? Every 3-5 years if you are 59-17 years of age. ? Every year if you are 24 years old or older. Diabetes Have regular diabetes screenings. This checks your fasting blood sugar level. Have the screening done:  Once every three years after age 52 if you are at a normal weight and have a low risk for diabetes.  More often and at a younger age if you are overweight or have a high risk for diabetes. What should I know about preventing infection? Hepatitis B If you have a higher risk for hepatitis B, you should be screened for this virus. Talk with your health care provider to find out if you are at risk for hepatitis B infection. Hepatitis C Testing is recommended for:  Everyone born from [birth year] through 1964.  Anyone with known risk factors for  hepatitis C. Sexually transmitted infections (STIs)  Get screened for STIs, including gonorrhea and chlamydia, if: ? You are sexually active and are younger than 74 years of age. ? You are older than 74 years of age and your health care provider tells you that you are at risk for this type of infection. ? Your sexual activity has changed since you were last screened, and you are at increased risk for chlamydia or gonorrhea. Ask your health care provider if you are at risk.  Ask your health care provider about whether you are at high risk for HIV. Your health  care provider may recommend a prescription medicine to help prevent HIV infection. If you choose to take medicine to prevent HIV, you should first get tested for HIV. You should then be tested every 3 months for as long as you are taking the medicine. Pregnancy  If you are about to stop having your period (premenopausal) and you may become pregnant, seek counseling before you get pregnant.  Take 400 to 800 micrograms (mcg) of folic acid every day if you become pregnant.  Ask for birth control (contraception) if you want to prevent pregnancy. Osteoporosis and menopause Osteoporosis is a disease in which the bones lose minerals and strength with aging. This can result in bone fractures. If you are 49 years old or older, or if you are at risk for osteoporosis and fractures, ask your health care provider if you should:  Be screened for bone loss.  Take a calcium or vitamin D supplement to lower your risk of fractures.  Be given hormone replacement therapy (HRT) to treat symptoms of menopause. Follow these instructions at home: Lifestyle  Do not use any products that contain nicotine or tobacco, such as cigarettes, e-cigarettes, and chewing tobacco. If you need help quitting, ask your health care provider.  Do not use street drugs.  Do not share needles.  Ask your health care provider for help if you need support or information about quitting drugs. Alcohol use  Do not drink alcohol if: ? Your health care provider tells you not to drink. ? You are pregnant, may be pregnant, or are planning to become pregnant.  If you drink alcohol: ? Limit how much you use to 0-1 drink a day. ? Limit intake if you are breastfeeding.  Be aware of how much alcohol is in your drink. In the [place] one drink equals one 12 oz bottle of beer (355 mL), one 5 oz glass of wine (148 mL), or one 1 oz glass of hard liquor (44 mL). General instructions  Schedule regular health, dental, and eye exams.  Stay  current with your vaccines.  Tell your health care provider if: ? You often feel depressed. ? You have ever been abused or do not feel safe at home. Summary  Adopting a healthy lifestyle and getting preventive care are important in promoting health and wellness.  Follow your health care provider's instructions about healthy diet, exercising, and getting tested or screened for diseases.  Follow your health care provider's instructions on monitoring your cholesterol and blood pressure. This information is not intended to replace advice given to you by your health care provider. Make sure you discuss any questions you have with your health care provider. Document Revised: 09/22/2018 Document Reviewed: 09/22/2018 Elsevier Patient Education  2021 [employer].

## 2021-01-21 NOTE — Progress Notes (Signed)
Chief Complaint  Patient presents with  . Annual Exam    HPI: Chelsey [name] 75 y.o. comes in today for Preventive Medicare exam/ wellness visit .Since last visit.  She is done okay no recurrence of breast cancer    To go on river cruise in [place]  After she saw her blood sugar numbers she is taken action to decrease intake and is already lost 5 pounds.  Taking Crestor every other day because when takes every day it causes muscle aches.  No new neuropathy symptoms has had a vision check up to date.  TakingAlprazolam   As needed .  Would like some for her overseas trip.  To have on hand.    Health Maintenance  Topic Date Due  . Hepatitis C Screening  Never done  . PNA vac Low Risk Adult (2 of 2 - PPSV23) 11/24/2014  . TETANUS/TDAP  10/03/2020  . COVID-19 Vaccine (4 - Booster for Pfizer series) 01/02/2021  . FOOT EXAM  01/19/2021  . OPHTHALMOLOGY EXAM  02/07/2021  . INFLUENZA VACCINE  05/13/2021  . HEMOGLOBIN A1C  07/13/2021  . COLONOSCOPY (Pts 45-61yrs Insurance coverage will need to be confirmed)  12/02/2021  . MAMMOGRAM  12/05/2021  . DEXA SCAN  Completed  . HPV VACCINES  Aged Out   Health Maintenance Review LIFESTYLE:  Exercise:  Not as much 0. 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 Last eye check UTD  Safety:  Has smoke detector and wears seat belts.  No excess sun exposure. Sees dentist regularly.  Falls: n  Depression: No anhedonia unusual crying or depressive symptoms  Nutrition: Eats well balanced diet; adequate calcium and vitamin D. No swallowing chewing problems.  Injury: no major injuries in the last six months.  Other healthcare providers:  Reviewed today .   Preventive parameters:   Reviewed  Will need shingrix and pneumovax  And will do  after getting back from travel  ADLS:   There are no problems or need for assistance  driving, feeding, obtaining food, dressing, toileting and bathing, managing money using phone. She is independent.    ROS:  GEN/ HEENT: No fever, significant weight changes sweats headaches vision problems hearing changes, CV/ PULM; No chest pain shortness of breath cough, syncope,edema  change in exercise tolerance. GI /GU: No adominal pain, vomiting, change in bowel habits. No blood in the stool. No significant GU symptoms. SKIN/HEME: ,no acute skin rashes suspicious lesions or bleeding. No lymphadenopathy, nodules, masses.  NEURO/ PSYCH:  No neurologic signs such as weakness numbness. No depression anxiety. IMM/ Allergy: No unusual infections.  Allergy .   REST of 12 system review negative except as per HPI   Past Medical History:  Diagnosis Date  . Allergy   . Aphthous ulcer of mouth 2010   related to chemotherapy   . Breast cancer ([place])   . Carpal tunnel syndrome of right wrist   . Colitis   . Colon polyp   . Diabetes mellitus   . Diverticulosis   . GERD (gastroesophageal reflux disease)   . Hyperlipidemia   . Hypertension   . Lymphedema 08-31-13   left arm  . Osteoarthritis   . Pneumonia 07/28/2011  . Sinusitis   . Sleep apnea    setting of  10  . Squamous cell carcinoma    left leg    Family History  Problem Relation Age of Onset  . Alzheimer's disease Mother   . Sleep apnea Mother   . Diabetes Mother   . Hypertension Mother   . Hyperlipidemia Mother   . Breast cancer Mother 52       triple negative  . Anxiety disorder Daughter   . Hypertension Son        pulmonary  . Congenital heart disease Son 0       TGA  died age 70     Social History   Socioeconomic History  . Marital status: Married    Spouse name: Not on file  . Number of children: 3  . Years of education: Not on file  . Highest education level: Not on file  Occupational History  . Occupation: Occupational hygienist: [employer]  . Occupation: Architectural technologist: [employer]  Tobacco Use  . Smoking status: Never Smoker  . Smokeless tobacco: Never Used  Vaping Use  . Vaping Use: Never used  Substance and Sexual Activity  . Alcohol use: Yes    Alcohol/week: 6.0 standard drinks    Types: 6 Glasses of wine per week    Comment: 1-2 glasses every other night  . Drug use: No  . Sexual activity: Yes    Birth control/protection: Post-menopausal, Surgical  Other Topics Concern  . Not on file  Social History Narrative   cb x 3   Realtor married    Bereaved  Parent   2 adult children   Mother with dementia and breast cancer   Passed away sept 22 15  Age 62   Social Determinants of Radio broadcast assistant Strain: Not on file  Food Insecurity: Not on file  Transportation Needs: Not on file  Physical Activity: Not on file  Stress: Not on file  Social Connections: Not on file    Outpatient Encounter Medications as of 01/21/2021  Medication Sig  . ALPRAZolam (XANAX) 0.25 MG tablet TAKE 1 TABLET(0.25 MG) BY MOUTH TWICE DAILY AS NEEDED FOR ANXIETY. AVOID REGULAR USE  . anastrozole (ARIMIDEX) 1 MG tablet Take 1 tablet (1 mg total) by mouth daily.  Marland Kitchen azelastine (ASTELIN) 0.1 % nasal spray INSTILL 1 TO 2 SPRAYS INTO EACH NOSTRIL TWICE DAILY AS NEEDED  . azithromycin (ZITHROMAX) 250 MG tablet 2 on day 1, then one daily  . dapagliflozin propanediol (FARXIGA) 5 MG TABS tablet Take 1 tablet (5 mg total) by mouth daily before breakfast.  . denosumab (PROLIA) 60 MG/ML SOLN injection Inject 60 mg into the skin every 6 (six) months. Administer in upper arm, thigh, or abdomen  . esomeprazole (NEXIUM) 40 MG capsule Take 40 mg by mouth as needed. Takes bedtime  . losartan (COZAAR) 50 MG tablet TAKE 1 TABLET(50 MG) BY MOUTH DAILY  . metFORMIN (GLUCOPHAGE-XR) 500 MG 24 hr tablet TAKE 2 TABLETS BY MOUTH TWICE DAILY WITH MEALS  . rosuvastatin (CRESTOR) 10 MG tablet TAKE 1 TABLET BY MOUTH EVERY DAY  (Patient taking differently: every other day. Patient reports taking every other day)  . sertraline (ZOLOFT) 25 MG tablet Take 1 tablet (25 mg total) by mouth daily.  . traZODone (DESYREL) 50 MG tablet Take 0.5-1 tablets (25-50 mg total) by mouth at bedtime as needed. for sleep  . [DISCONTINUED] methocarbamol (ROBAXIN) 500 MG tablet Take 1 tablet (500 mg total) by mouth every 8 (eight) hours  as needed for muscle spasms.  . [DISCONTINUED] Olopatadine HCl 0.2 % SOLN [place] daily   No facility-administered encounter medications on file as of 01/21/2021.    EXAM:  BP 110/60 (BP Location: Right Arm, Patient Position: Sitting, Cuff Size: Large)   Pulse 65   Temp 98.2 F (36.8 C) (Oral)   Ht 5\' 3"  (1.6 m)   Wt 184 lb 3.2 oz (83.6 kg)   SpO2 96%   BMI 32.63 kg/m   Body mass index is 32.63 kg/m.  Physical Exam: Vital signs reviewed XBM:WUXL is a well-developed well-nourished alert cooperative   who appears stated age in no acute distress.  HEENT: normocephalic atraumatic , Eyes: PERRL EOM's full, conjunctiva clear, Nares: paten,t no deformity discharge or tenderness., Ears: no deformity EAC's clear TMs with normal landmarks. Mouth:masked NECK: supple without masses, thyromegaly or bruits. CHEST/PULM:  Clear to auscultation and percussion breath sounds equal no wheeze , rales or rhonchi. No chest wall deformities or tenderness. CV: PMI is nondisplaced, S1 S2 no gallops, murmurs, rubs. Peripheral pulses are full without delay.No JVD .  ABDOMEN: Bowel sounds normal nontender  No guard or rebound, no hepato splenomegal no CVA tenderness.   Extremtities:  No clubbing cyanosis or edema, no acute joint swelling or redness no focal atrophy NEURO:  Oriented x3, cranial nerves 3-12 appear to be intact, no obvious focal weakness,gait within normal limits no abnormal reflexes or asymmetrical SKIN: No acute rashes normal turgor, color, no bruising or petechiae. PSYCH: Oriented, good eye contact, no  obvious depression anxiety, cognition and judgment appear normal. LN: no cervical axillary inguinal adenopathy No noted deficits in memory, attention, and speech. Diabetic Foot Exam - Simple   Simple Foot Form Visual Inspection No deformities, no ulcerations, no other skin breakdown bilaterally: Yes Sensation Testing Intact to touch and monofilament testing bilaterally: Yes Pulse Check Posterior Tibialis and Dorsalis pulse intact bilaterally: Yes Comments Some dry skin on sole    Wt Readings from Last 3 Encounters:  01/21/21 184 lb 3.2 oz (83.6 kg)  12/24/20 193 lb (87.5 kg)  08/07/20 187 lb 6.4 oz (85 kg)     Lab Results  Component Value Date   WBC 4.9 01/11/2021   HGB 13.4 01/11/2021   HCT 38.7 01/11/2021   PLT 188.0 01/11/2021   GLUCOSE 201 (H) 01/11/2021   CHOL 179 01/11/2021   TRIG 169.0 (H) 01/11/2021   HDL 60.40 01/11/2021   LDLDIRECT 173.9 11/09/2013   LDLCALC 85 01/11/2021   ALT 17 01/11/2021   AST 17 01/11/2021   NA 138 01/11/2021   K 4.6 01/11/2021   CL 101 01/11/2021   CREATININE 0.83 01/11/2021   BUN 16 01/11/2021   CO2 27 01/11/2021   TSH 2.40 01/11/2021   INR 0.93 08/31/2013   HGBA1C 7.6 (H) 01/11/2021   MICROALBUR 2.3 (H) 01/11/2021    ASSESSMENT AND PLAN:  Discussed the following assessment and plan:  Encounter for preventative adult health care exam with abnormal findings  Medication management  Essential hypertension  Hyperlipidemia, unspecified hyperlipidemia type  Type 2 diabetes mellitus with hyperglycemia, without long-term current use of insulin ([place]) - worsened control pat taking lsi action   HX: breast cancer  OSA on CPAP Options discussed as far as additive medicine we will add Farxiga low-dose 5 mg a day can check with her insurance whether Vania Rea is better covered.  Risk benefit of medication discussed.  She will start this after she gets back from her trip to [place] in case  there is side effects and will back off  during times of dehydration. Consider trying to take the Crestor an extra day a week LDL goal is about 70. Plan follow-up in 4 months we will do A1c at visit. At this time she is declining any medicine that is an injection such as Ozempic. We will refill alprazolam for her trip if needed. Will work on healthy weight loss. Patient Care Team: Tanee Henery, Standley Brooking, MD as PCP - Gaston Islam, MD as Attending Physician (Orthopedic Surgery) Lafayette Dragon, MD (Inactive) as Consulting Physician (Gastroenterology) Harvie Heck, MD as Physician Assistant (Internal Medicine) Deneise Lever, MD as Consulting Physician (Pulmonary Disease) Clent Jacks, MD as Consulting Physician (Ophthalmology) Audery Amel Sharma Covert, [facility] (Inactive) as Pharmacist (Pharmacist)  Patient Instructions   Continue   efforts  For  lifestyle intervention healthy eating and exercise .   ldl goal 70 range   Add medication.   Kerry Dory. After you get back.   Healthy weight loss.   Can get  Pneumovax   When you get back or next visit  Shingles vaccine at  Your pharmacy.     Plan   Hg a1c in 4 months and fu then .     Health Maintenance, Female Adopting a healthy lifestyle and getting preventive care are important in promoting health and wellness. Ask your health care provider about:  The right schedule for you to have regular tests and exams.  Things you can do on your own to prevent diseases and keep yourself healthy. What should I know about diet, weight, and exercise? Eat a healthy diet  Eat a diet that includes plenty of vegetables, fruits, low-fat dairy products, and lean protein.  Do not eat a lot of foods that are high in solid fats, added sugars, or sodium.   Maintain a healthy weight Body mass index (BMI) is used to identify weight problems. It estimates body fat based on height and weight. Your health care provider can help determine your BMI and help you achieve or maintain a healthy  weight. Get regular exercise Get regular exercise. This is one of the most important things you can do for your health. Most adults should:  Exercise for at least 150 minutes each week. The exercise should increase your heart rate and make you sweat (moderate-intensity exercise).  Do strengthening exercises at least twice a week. This is in addition to the moderate-intensity exercise.  Spend less time sitting. Even light physical activity can be beneficial. Watch cholesterol and blood lipids Have your blood tested for lipids and cholesterol at 74 years of age, then have this test every 5 years. Have your cholesterol levels checked more often if:  Your lipid or cholesterol levels are high.  You are older than 74 years of age.  You are at high risk for heart disease. What should I know about cancer screening? Depending on your health history and family history, you may need to have cancer screening at various ages. This may include screening for:  Breast cancer.  Cervical cancer.  Colorectal cancer.  Skin cancer.  Lung cancer. What should I know about heart disease, diabetes, and high blood pressure? Blood pressure and heart disease  High blood pressure causes heart disease and increases the risk of stroke. This is more likely to develop in people who have high blood pressure readings, are of African descent, or are overweight.  Have your blood pressure checked: ? Every 3-5 years if you are  40-49 years of age. ? Every year if you are 46 years old or older. Diabetes Have regular diabetes screenings. This checks your fasting blood sugar level. Have the screening done:  Once every three years after age 35 if you are at a normal weight and have a low risk for diabetes.  More often and at a younger age if you are overweight or have a high risk for diabetes. What should I know about preventing infection? Hepatitis B If you have a higher risk for hepatitis B, you should be  screened for this virus. Talk with your health care provider to find out if you are at risk for hepatitis B infection. Hepatitis C Testing is recommended for:  Everyone born from [birth year] through 1964.  Anyone with known risk factors for hepatitis C. Sexually transmitted infections (STIs)  Get screened for STIs, including gonorrhea and chlamydia, if: ? You are sexually active and are younger than 74 years of age. ? You are older than 74 years of age and your health care provider tells you that you are at risk for this type of infection. ? Your sexual activity has changed since you were last screened, and you are at increased risk for chlamydia or gonorrhea. Ask your health care provider if you are at risk.  Ask your health care provider about whether you are at high risk for HIV. Your health care provider may recommend a prescription medicine to help prevent HIV infection. If you choose to take medicine to prevent HIV, you should first get tested for HIV. You should then be tested every 3 months for as long as you are taking the medicine. Pregnancy  If you are about to stop having your period (premenopausal) and you may become pregnant, seek counseling before you get pregnant.  Take 400 to 800 micrograms (mcg) of folic acid every day if you become pregnant.  Ask for birth control (contraception) if you want to prevent pregnancy. Osteoporosis and menopause Osteoporosis is a disease in which the bones lose minerals and strength with aging. This can result in bone fractures. If you are 82 years old or older, or if you are at risk for osteoporosis and fractures, ask your health care provider if you should:  Be screened for bone loss.  Take a calcium or vitamin D supplement to lower your risk of fractures.  Be given hormone replacement therapy (HRT) to treat symptoms of menopause. Follow these instructions at home: Lifestyle  Do not use any products that contain nicotine or tobacco, such as  cigarettes, e-cigarettes, and chewing tobacco. If you need help quitting, ask your health care provider.  Do not use street drugs.  Do not share needles.  Ask your health care provider for help if you need support or information about quitting drugs. Alcohol use  Do not drink alcohol if: ? Your health care provider tells you not to drink. ? You are pregnant, may be pregnant, or are planning to become pregnant.  If you drink alcohol: ? Limit how much you use to 0-1 drink a day. ? Limit intake if you are breastfeeding.  Be aware of how much alcohol is in your drink. In the [place] one drink equals one 12 oz bottle of beer (355 mL), one 5 oz glass of wine (148 mL), or one 1 oz glass of hard liquor (44 mL). General instructions  Schedule regular health, dental, and eye exams.  Stay current with your vaccines.  Tell your health care provider if: ?  You often feel depressed. ? You have ever been abused or do not feel safe at home. Summary  Adopting a healthy lifestyle and getting preventive care are important in promoting health and wellness.  Follow your health care provider's instructions about healthy diet, exercising, and getting tested or screened for diseases.  Follow your health care provider's instructions on monitoring your cholesterol and blood pressure. This information is not intended to replace advice given to you by your health care provider. Make sure you discuss any questions you have with your health care provider. Document Revised: 09/22/2018 Document Reviewed: 09/22/2018 Elsevier Patient Education  2021 [place] K. Lynnelle Mesmer M.D.

## 2021-02-05 ENCOUNTER — Encounter: Payer: Medicare Other | Admitting: Internal Medicine

## 2021-02-08 ENCOUNTER — Ambulatory Visit: Payer: Medicare Other

## 2021-02-14 DIAGNOSIS — R590 Localized enlarged lymph nodes: Secondary | ICD-10-CM | POA: Diagnosis not present

## 2021-02-14 DIAGNOSIS — Z9889 Other specified postprocedural states: Secondary | ICD-10-CM | POA: Diagnosis not present

## 2021-02-16 ENCOUNTER — Other Ambulatory Visit: Payer: Self-pay | Admitting: Internal Medicine

## 2021-03-12 DIAGNOSIS — D225 Melanocytic nevi of trunk: Secondary | ICD-10-CM | POA: Diagnosis not present

## 2021-03-12 DIAGNOSIS — L281 Prurigo nodularis: Secondary | ICD-10-CM | POA: Diagnosis not present

## 2021-03-12 DIAGNOSIS — Z85828 Personal history of other malignant neoplasm of skin: Secondary | ICD-10-CM | POA: Diagnosis not present

## 2021-03-12 DIAGNOSIS — L821 Other seborrheic keratosis: Secondary | ICD-10-CM | POA: Diagnosis not present

## 2021-03-12 DIAGNOSIS — L814 Other melanin hyperpigmentation: Secondary | ICD-10-CM | POA: Diagnosis not present

## 2021-03-12 DIAGNOSIS — D1801 Hemangioma of skin and subcutaneous tissue: Secondary | ICD-10-CM | POA: Diagnosis not present

## 2021-03-12 DIAGNOSIS — L820 Inflamed seborrheic keratosis: Secondary | ICD-10-CM | POA: Diagnosis not present

## 2021-03-19 DIAGNOSIS — G4733 Obstructive sleep apnea (adult) (pediatric): Secondary | ICD-10-CM | POA: Diagnosis not present

## 2021-03-21 ENCOUNTER — Other Ambulatory Visit: Payer: Self-pay | Admitting: Internal Medicine

## 2021-05-04 NOTE — Assessment & Plan Note (Signed)
Suspect recurrent acute maxillary sinusitis Plan- ZPAk, refill azelastine nasal spray

## 2021-05-04 NOTE — Assessment & Plan Note (Signed)
Benefits with good compliance and control Staying with nasal mask she got at mask fitting Plan- continue auto 5-15

## 2021-05-31 DIAGNOSIS — R300 Dysuria: Secondary | ICD-10-CM | POA: Diagnosis not present

## 2021-06-02 NOTE — Progress Notes (Signed)
Chief Complaint  Patient presents with   Follow-up     HPI: Chelsey [name] 75 y.o. come in for Chronic disease management  dm  had been doing ok until last week and having problems  "Im a mess " Took  farxiga and decreased  to k qod cause of   yeast infection  gu irritation  .  Then last week  5 days ago noted  bruise on hands.  And then spots all over legs trunk at flap and arms  less on face and back . Then noted blood in urine 3 days ago  seen gyne  ua had wbc and blood   Septra  8 19 x 3 days   culture not notified yet.   Out of med  no fever thinks infection may have triggered this.  ? Be on more med?  No other new sx  .  She stopped the  farxiga last week when all began  No more vuisible  she sees in urine and irritation is better . Wonders if infection still present and causing the sx  No asa , other change in med   still low dose trazodone and sertraline  Has fu hem onc end of month ( [facility])  ROS: See pertinent positives and negatives per HPI.  Past Medical History:  Diagnosis Date   Allergy    Aphthous ulcer of mouth 2010   related to chemotherapy    Breast cancer ([place])    Carpal tunnel syndrome of right wrist    Colitis    Colon polyp    Diabetes mellitus    Diverticulosis    GERD (gastroesophageal reflux disease)    Hyperlipidemia    Hypertension    Lymphedema 08-31-13   left arm   Osteoarthritis    Pneumonia 07/28/2011   Sinusitis    Sleep apnea    setting of 10   Squamous cell carcinoma    left leg    Family History  Problem Relation Age of Onset   Alzheimer's disease Mother    Sleep apnea Mother    Diabetes Mother    Hypertension Mother    Hyperlipidemia Mother    Breast cancer Mother 59       triple negative   Anxiety disorder Daughter    Hypertension Son        pulmonary   Congenital heart disease Son 0       TGA  died age 91     Social History   Socioeconomic History   Marital status: Married    Spouse name: Not on file    Number of children: 3   Years of education: Not on file   Highest education level: Not on file  Occupational History   Occupation: Architectural technologist: [employer]   Occupation: Architectural technologist: [employer]  Tobacco Use   Smoking status: Never   Smokeless tobacco: Never  Vaping Use   Vaping Use: Never used  Substance and Sexual Activity   Alcohol use: Yes    Alcohol/week: 6.0 standard drinks    Types: 6 Glasses of wine per week    Comment: 1-2 glasses every other night   Drug use: No   Sexual activity: Yes    Birth control/protection: Post-menopausal, Surgical  Other Topics Concern   Not on file  Social History Narrative   cb x 3   Realtor married    Bereaved  Parent   2  adult children   Mother with dementia and breast cancer   Passed away sept 49 15  Age 60   Social Determinants of Radio broadcast assistant Strain: Not on file  Food Insecurity: Not on file  Transportation Needs: Not on file  Physical Activity: Not on file  Stress: Not on file  Social Connections: Not on file    Outpatient Medications Prior to Visit  Medication Sig Dispense Refill   ALPRAZolam (XANAX) 0.25 MG tablet TAKE 1 TABLET(0.25 MG) BY MOUTH TWICE DAILY AS NEEDED FOR ANXIETY. AVOID REGULAR USE 30 tablet 0   anastrozole (ARIMIDEX) 1 MG tablet Take 1 tablet (1 mg total) by mouth daily. 90 tablet 5   azelastine (ASTELIN) 0.1 % nasal spray INSTILL 1 TO 2 SPRAYS INTO EACH NOSTRIL TWICE DAILY AS NEEDED 30 mL 5   azithromycin (ZITHROMAX) 250 MG tablet 2 on day 1, then one daily 6 tablet 0   dapagliflozin propanediol (FARXIGA) 5 MG TABS tablet Take 1 tablet (5 mg total) by mouth daily before breakfast. 30 tablet 3   denosumab (PROLIA) 60 MG/ML SOLN injection Inject 60 mg into the skin every 6 (six) months. Administer in upper arm, thigh, or abdomen     esomeprazole (NEXIUM) 40 MG capsule Take 40 mg by mouth as needed. Takes bedtime     losartan (COZAAR) 50 MG tablet TAKE 1 TABLET(50 MG) BY  MOUTH DAILY 90 tablet 2   metFORMIN (GLUCOPHAGE-XR) 500 MG 24 hr tablet TAKE 2 TABLETS BY MOUTH TWICE DAILY WITH MEALS 360 tablet 1   rosuvastatin (CRESTOR) 10 MG tablet TAKE 1 TABLET BY MOUTH EVERY DAY 90 tablet 1   sertraline (ZOLOFT) 25 MG tablet TAKE 1 TABLET BY MOUTH EVERY DAY 90 tablet 0   sulfamethoxazole-trimethoprim (BACTRIM DS) 800-160 MG tablet Bactrim DS 800 mg-160 mg tablet  Take 1 tablet twice a day by oral route for 3 days.     traZODone (DESYREL) 50 MG tablet TAKE 1/2 TO 1 TABLET(25 TO 50 MG) BY MOUTH AT BEDTIME AS NEEDED FOR SLEEP 30 tablet 3   No facility-administered medications prior to visit.     EXAM:  BP 120/66 (BP Location: Right Arm, Patient Position: Sitting, Cuff Size: Normal)   Pulse 67   Temp 99 F (37.2 C) (Oral)   Ht '5\' 3"'$  (1.6 m)   Wt 182 lb 9.6 oz (82.8 kg)   SpO2 97%   BMI 32.35 kg/m   Body mass index is 32.35 kg/m.  GENERAL: vitals reviewed and listed above, alert, oriented, appears well hydrated and in no acute distress HEENT: atraumatic, conjunctiva  clear, no obvious abnormalities on inspection of external nose and ears OP masked  NECK: no obvious masses on inspection palpation  LUNGS: clear to auscultation bilaterally, no wheezes, rales or rhonchi, good air movement CV: HRRR, no clubbing cyanosis or  peripheral edema nl cap refill  Skin scattered small bruises on arm thigh and then petechial rash on arms and legs some on trunk none on face minimal back no purpura. MS: moves all extremities without noticeable focal  abnormality PSYCH: pleasant and cooperative, normal conversation. Lab Results  Component Value Date   WBC 4.9 01/11/2021   HGB 13.4 01/11/2021   HCT 38.7 01/11/2021   PLT 188.0 01/11/2021   GLUCOSE 201 (H) 01/11/2021   CHOL 179 01/11/2021   TRIG 169.0 (H) 01/11/2021   HDL 60.40 01/11/2021   LDLDIRECT 173.9 11/09/2013   LDLCALC 85 01/11/2021   ALT 17 01/11/2021  AST 17 01/11/2021   NA 138 01/11/2021   K 4.6 01/11/2021    CL 101 01/11/2021   CREATININE 0.83 01/11/2021   BUN 16 01/11/2021   CO2 27 01/11/2021   TSH 2.40 01/11/2021   INR 0.93 08/31/2013   HGBA1C 6.8 (A) 06/03/2021   MICROALBUR 2.3 (H) 01/11/2021   BP Readings from Last 3 Encounters:  06/03/21 120/66  01/21/21 110/60  12/24/20 130/68   Diabetic Foot Exam - Simple   No data filed      ASSESSMENT AND PLAN:  Discussed the following assessment and plan:  Bruising - Plan: CBC with Differential/Platelet, Hemoglobin A1c, Protime-INR, APTT, Comprehensive metabolic panel, Vitamin 123456, C-reactive protein, Lactate Dehydrogenase, Lactate Dehydrogenase, C-reactive protein, Vitamin B12, Comprehensive metabolic panel, APTT, Protime-INR, Hemoglobin A1c, CBC with Differential/Platelet, CANCELED: POCT urinalysis dipstick  Medication management - Plan: CBC with Differential/Platelet, Hemoglobin A1c, Protime-INR, APTT, Comprehensive metabolic panel, Vitamin 123456, C-reactive protein, Lactate Dehydrogenase, Lactate Dehydrogenase, C-reactive protein, Vitamin B12, Comprehensive metabolic panel, APTT, Protime-INR, Hemoglobin A1c, CBC with Differential/Platelet  Type 2 diabetes mellitus with hyperglycemia, without long-term current use of insulin (HCC) - Plan: POCT glycosylated hemoglobin (Hb A1C), CBC with Differential/Platelet, Hemoglobin A1c, Protime-INR, APTT, Comprehensive metabolic panel, Vitamin 123456, Vitamin B12, Comprehensive metabolic panel, APTT, Protime-INR, Hemoglobin A1c, CBC with Differential/Platelet  Essential hypertension - Plan: CBC with Differential/Platelet, Hemoglobin A1c, Protime-INR, APTT, Comprehensive metabolic panel, Comprehensive metabolic panel, APTT, Protime-INR, Hemoglobin A1c, CBC with Differential/Platelet  Petechial rash - Plan: Vitamin B12, C-reactive protein, Lactate Dehydrogenase, Lactate Dehydrogenase, C-reactive protein, Vitamin B12  HX: breast cancer - Plan: Comprehensive metabolic panel, Comprehensive metabolic  panel  Hematuria, unspecified type - Plan: CBC with Differential/Platelet, Hemoglobin A1c, Protime-INR, APTT, Comprehensive metabolic panel, Vitamin 123456, C-reactive protein, Lactate Dehydrogenase, Urine Culture, POCT Urinalysis Dipstick (Automated), Urine Culture, Lactate Dehydrogenase, C-reactive protein, Vitamin B12, Comprehensive metabolic panel, APTT, Protime-INR, Hemoglobin A1c, CBC with Differential/Platelet, CANCELED: POCT urinalysis dipstick Agree with stopping the farxiga   causing gu sx and  poss other .  No obv adenopathy HS megaly today   and no systemic sx  fever etc.  advised to return or notify health care team  if  new concerns arise. Note she was doing well getting her sugar and weight down on the [place]. A1c was better  Need to document UTI culture results  .  And platelet count     Patient Instructions  Let us know  what culture showed   repeating  still have some blood in urine.   Labs pending .  Then plan next step.    Standley Brooking. Jennye Runquist M.D.

## 2021-06-03 ENCOUNTER — Telehealth: Payer: Self-pay

## 2021-06-03 ENCOUNTER — Ambulatory Visit (INDEPENDENT_AMBULATORY_CARE_PROVIDER_SITE_OTHER): Payer: Medicare Other | Admitting: Internal Medicine

## 2021-06-03 ENCOUNTER — Encounter: Payer: Self-pay | Admitting: Internal Medicine

## 2021-06-03 ENCOUNTER — Other Ambulatory Visit: Payer: Self-pay

## 2021-06-03 VITALS — BP 120/66 | HR 67 | Temp 99.0°F | Ht 63.0 in | Wt 182.6 lb

## 2021-06-03 DIAGNOSIS — M545 Low back pain, unspecified: Secondary | ICD-10-CM | POA: Diagnosis not present

## 2021-06-03 DIAGNOSIS — I4510 Unspecified right bundle-branch block: Secondary | ICD-10-CM | POA: Diagnosis not present

## 2021-06-03 DIAGNOSIS — I10 Essential (primary) hypertension: Secondary | ICD-10-CM | POA: Diagnosis not present

## 2021-06-03 DIAGNOSIS — Z79811 Long term (current) use of aromatase inhibitors: Secondary | ICD-10-CM | POA: Diagnosis not present

## 2021-06-03 DIAGNOSIS — T148XXA Other injury of unspecified body region, initial encounter: Secondary | ICD-10-CM | POA: Diagnosis not present

## 2021-06-03 DIAGNOSIS — R233 Spontaneous ecchymoses: Secondary | ICD-10-CM | POA: Diagnosis not present

## 2021-06-03 DIAGNOSIS — Z9221 Personal history of antineoplastic chemotherapy: Secondary | ICD-10-CM | POA: Diagnosis not present

## 2021-06-03 DIAGNOSIS — R319 Hematuria, unspecified: Secondary | ICD-10-CM | POA: Diagnosis not present

## 2021-06-03 DIAGNOSIS — D7589 Other specified diseases of blood and blood-forming organs: Secondary | ICD-10-CM | POA: Diagnosis not present

## 2021-06-03 DIAGNOSIS — E785 Hyperlipidemia, unspecified: Secondary | ICD-10-CM | POA: Diagnosis not present

## 2021-06-03 DIAGNOSIS — Z96653 Presence of artificial knee joint, bilateral: Secondary | ICD-10-CM | POA: Diagnosis not present

## 2021-06-03 DIAGNOSIS — Z79899 Other long term (current) drug therapy: Secondary | ICD-10-CM | POA: Diagnosis not present

## 2021-06-03 DIAGNOSIS — Z853 Personal history of malignant neoplasm of breast: Secondary | ICD-10-CM

## 2021-06-03 DIAGNOSIS — D696 Thrombocytopenia, unspecified: Secondary | ICD-10-CM | POA: Diagnosis not present

## 2021-06-03 DIAGNOSIS — D693 Immune thrombocytopenic purpura: Secondary | ICD-10-CM | POA: Diagnosis not present

## 2021-06-03 DIAGNOSIS — Z170 Estrogen receptor positive status [ER+]: Secondary | ICD-10-CM | POA: Diagnosis not present

## 2021-06-03 DIAGNOSIS — Z9012 Acquired absence of left breast and nipple: Secondary | ICD-10-CM | POA: Diagnosis not present

## 2021-06-03 DIAGNOSIS — G4730 Sleep apnea, unspecified: Secondary | ICD-10-CM | POA: Diagnosis not present

## 2021-06-03 DIAGNOSIS — Z7984 Long term (current) use of oral hypoglycemic drugs: Secondary | ICD-10-CM | POA: Diagnosis not present

## 2021-06-03 DIAGNOSIS — E119 Type 2 diabetes mellitus without complications: Secondary | ICD-10-CM | POA: Diagnosis not present

## 2021-06-03 DIAGNOSIS — G4700 Insomnia, unspecified: Secondary | ICD-10-CM | POA: Diagnosis not present

## 2021-06-03 DIAGNOSIS — G4733 Obstructive sleep apnea (adult) (pediatric): Secondary | ICD-10-CM

## 2021-06-03 DIAGNOSIS — E1165 Type 2 diabetes mellitus with hyperglycemia: Secondary | ICD-10-CM

## 2021-06-03 LAB — PROTIME-INR
INR: 1 ratio (ref 0.8–1.0)
Prothrombin Time: 10.6 s (ref 9.6–13.1)

## 2021-06-03 LAB — COMPREHENSIVE METABOLIC PANEL
ALT: 13 U/L (ref 0–35)
AST: 16 U/L (ref 0–37)
Albumin: 4.5 g/dL (ref 3.5–5.2)
Alkaline Phosphatase: 48 U/L (ref 39–117)
BUN: 11 mg/dL (ref 6–23)
CO2: 24 mEq/L (ref 19–32)
Calcium: 9.6 mg/dL (ref 8.4–10.5)
Chloride: 104 mEq/L (ref 96–112)
Creatinine, Ser: 0.77 mg/dL (ref 0.40–1.20)
GFR: 76.14 mL/min (ref >60.00)
Glucose, Bld: 141 mg/dL — ABNORMAL HIGH (ref 70–99)
Potassium: 3.9 mEq/L (ref 3.5–5.1)
Sodium: 139 mEq/L (ref 135–145)
Total Bilirubin: 0.9 mg/dL (ref 0.2–1.2)
Total Protein: 7 g/dL (ref 6.0–8.3)

## 2021-06-03 LAB — POC URINALSYSI DIPSTICK (AUTOMATED)
Bilirubin, UA: NEGATIVE
Glucose, UA: NEGATIVE
Ketones, UA: NEGATIVE
Nitrite, UA: NEGATIVE
Protein, UA: NEGATIVE
Spec Grav, UA: 1.015 (ref 1.010–1.025)
Urobilinogen, UA: 0.2 E.U./dL
pH, UA: 5.5 (ref 5.0–8.0)

## 2021-06-03 LAB — CBC WITH DIFFERENTIAL/PLATELET
Basophils Absolute: 0 10*3/uL (ref 0.0–0.1)
Basophils Relative: 0.7 % (ref 0.0–3.0)
Eosinophils Absolute: 0.1 10*3/uL (ref 0.0–0.7)
Eosinophils Relative: 2.9 % (ref 0.0–5.0)
HCT: 37 % (ref 36.0–46.0)
Hemoglobin: 12.8 g/dL (ref 12.0–15.0)
Lymphocytes Relative: 31.2 % (ref 12.0–46.0)
Lymphs Abs: 1.6 10*3/uL (ref 0.7–4.0)
MCHC: 34.7 g/dL (ref 30.0–36.0)
MCV: 90.5 fl (ref 78.0–100.0)
Monocytes Absolute: 0.5 10*3/uL (ref 0.1–1.0)
Monocytes Relative: 8.9 % (ref 3.0–12.0)
Neutro Abs: 2.9 10*3/uL (ref 1.4–7.7)
Neutrophils Relative %: 56.3 % (ref 43.0–77.0)
Platelets: 3 10*3/uL — CL (ref 150.0–400.0)
RBC: 4.08 Mil/uL (ref 3.87–5.11)
RDW: 13.6 % (ref 11.5–15.5)
WBC: 5.1 10*3/uL (ref 4.0–10.5)

## 2021-06-03 LAB — HEMOGLOBIN A1C: Hgb A1c MFr Bld: 7.1 % — ABNORMAL HIGH (ref 4.6–6.5)

## 2021-06-03 LAB — C-REACTIVE PROTEIN: CRP: <1 mg/dL (ref 0.5–20.0)

## 2021-06-03 LAB — LACTATE DEHYDROGENASE: LDH: 172 U/L (ref 120–250)

## 2021-06-03 LAB — POCT GLYCOSYLATED HEMOGLOBIN (HGB A1C): Hemoglobin A1C: 6.8 % — AB (ref 4.0–5.6)

## 2021-06-03 LAB — VITAMIN B12: Vitamin B-12: 184 pg/mL — ABNORMAL LOW (ref 211–911)

## 2021-06-03 LAB — APTT: aPTT: 27.9 s (ref 23.4–32.7)

## 2021-06-03 NOTE — Progress Notes (Signed)
Spoke with patient this afternoon of her extremely low platelet count.  No new symptoms.  The level of her platelet count is an emergency if confirmed.( Risk of bleeding)  Advise she seek ED care prefer at [facility] since her oncologist is there however could do here to be confirmed and then hematology to assess.  It is reassuring that her hemoglobin and white cells levels are normal. B12 level is low needs to be taking B12 sublingual preferred every day 1000 to 3000 international units.  Plan follow-up after low platelet count is evaluated for other diabetes intervention.

## 2021-06-03 NOTE — Telephone Encounter (Signed)
CRITICAL VALUE STICKER  CRITICAL VALUE: Platelet count; 3000  RECEIVER (on-site recipient of call): [facility] Lab tech  DATE & TIME NOTIFIED: 06/03/2021 @ 3:18pm   MESSENGER (representative from lab):  MD NOTIFIED: Dr. Regis Bill  TIME OF NOTIFICATION: 3;:19pm   RESPONSE: Informed via verbal and epic

## 2021-06-03 NOTE — Patient Instructions (Addendum)
Let us know  what culture showed   repeating  still have some blood in urine.   Labs pending .  Then plan next step.

## 2021-06-04 DIAGNOSIS — D696 Thrombocytopenia, unspecified: Secondary | ICD-10-CM | POA: Diagnosis not present

## 2021-06-04 DIAGNOSIS — Z7984 Long term (current) use of oral hypoglycemic drugs: Secondary | ICD-10-CM | POA: Diagnosis not present

## 2021-06-04 DIAGNOSIS — I10 Essential (primary) hypertension: Secondary | ICD-10-CM | POA: Diagnosis not present

## 2021-06-04 DIAGNOSIS — Z79811 Long term (current) use of aromatase inhibitors: Secondary | ICD-10-CM | POA: Diagnosis not present

## 2021-06-04 DIAGNOSIS — E785 Hyperlipidemia, unspecified: Secondary | ICD-10-CM | POA: Diagnosis not present

## 2021-06-04 DIAGNOSIS — G4700 Insomnia, unspecified: Secondary | ICD-10-CM | POA: Diagnosis not present

## 2021-06-04 DIAGNOSIS — E119 Type 2 diabetes mellitus without complications: Secondary | ICD-10-CM | POA: Diagnosis not present

## 2021-06-04 DIAGNOSIS — Z853 Personal history of malignant neoplasm of breast: Secondary | ICD-10-CM | POA: Diagnosis not present

## 2021-06-04 LAB — URINE CULTURE
MICRO NUMBER:: 12273125
Result:: NO GROWTH
SPECIMEN QUALITY:: ADEQUATE

## 2021-06-04 NOTE — Telephone Encounter (Signed)
Pt notified yesterday and directed to ED  and has  been admitted to [place] for further eval .  Working dx poss ITP

## 2021-06-05 DIAGNOSIS — E785 Hyperlipidemia, unspecified: Secondary | ICD-10-CM | POA: Diagnosis not present

## 2021-06-05 DIAGNOSIS — D696 Thrombocytopenia, unspecified: Secondary | ICD-10-CM | POA: Diagnosis not present

## 2021-06-05 DIAGNOSIS — Z853 Personal history of malignant neoplasm of breast: Secondary | ICD-10-CM | POA: Diagnosis not present

## 2021-06-05 DIAGNOSIS — G4700 Insomnia, unspecified: Secondary | ICD-10-CM | POA: Diagnosis not present

## 2021-06-05 DIAGNOSIS — Z79811 Long term (current) use of aromatase inhibitors: Secondary | ICD-10-CM | POA: Diagnosis not present

## 2021-06-05 DIAGNOSIS — I10 Essential (primary) hypertension: Secondary | ICD-10-CM | POA: Diagnosis not present

## 2021-06-05 DIAGNOSIS — Z7984 Long term (current) use of oral hypoglycemic drugs: Secondary | ICD-10-CM | POA: Diagnosis not present

## 2021-06-05 DIAGNOSIS — E119 Type 2 diabetes mellitus without complications: Secondary | ICD-10-CM | POA: Diagnosis not present

## 2021-06-06 DIAGNOSIS — I10 Essential (primary) hypertension: Secondary | ICD-10-CM | POA: Diagnosis not present

## 2021-06-06 DIAGNOSIS — D696 Thrombocytopenia, unspecified: Secondary | ICD-10-CM | POA: Diagnosis not present

## 2021-06-06 DIAGNOSIS — G4733 Obstructive sleep apnea (adult) (pediatric): Secondary | ICD-10-CM | POA: Diagnosis not present

## 2021-06-06 DIAGNOSIS — Z9221 Personal history of antineoplastic chemotherapy: Secondary | ICD-10-CM | POA: Diagnosis not present

## 2021-06-06 DIAGNOSIS — Z853 Personal history of malignant neoplasm of breast: Secondary | ICD-10-CM | POA: Diagnosis not present

## 2021-06-06 DIAGNOSIS — Z9012 Acquired absence of left breast and nipple: Secondary | ICD-10-CM | POA: Diagnosis not present

## 2021-06-06 DIAGNOSIS — E119 Type 2 diabetes mellitus without complications: Secondary | ICD-10-CM | POA: Diagnosis not present

## 2021-06-06 DIAGNOSIS — Z79811 Long term (current) use of aromatase inhibitors: Secondary | ICD-10-CM | POA: Diagnosis not present

## 2021-06-06 DIAGNOSIS — Z170 Estrogen receptor positive status [ER+]: Secondary | ICD-10-CM | POA: Diagnosis not present

## 2021-06-06 DIAGNOSIS — E785 Hyperlipidemia, unspecified: Secondary | ICD-10-CM | POA: Diagnosis not present

## 2021-06-07 DIAGNOSIS — E119 Type 2 diabetes mellitus without complications: Secondary | ICD-10-CM | POA: Diagnosis not present

## 2021-06-07 DIAGNOSIS — D696 Thrombocytopenia, unspecified: Secondary | ICD-10-CM | POA: Diagnosis not present

## 2021-06-07 DIAGNOSIS — Z79811 Long term (current) use of aromatase inhibitors: Secondary | ICD-10-CM | POA: Diagnosis not present

## 2021-06-07 DIAGNOSIS — Z170 Estrogen receptor positive status [ER+]: Secondary | ICD-10-CM | POA: Diagnosis not present

## 2021-06-07 DIAGNOSIS — Z9012 Acquired absence of left breast and nipple: Secondary | ICD-10-CM | POA: Diagnosis not present

## 2021-06-07 DIAGNOSIS — Z9221 Personal history of antineoplastic chemotherapy: Secondary | ICD-10-CM | POA: Diagnosis not present

## 2021-06-07 DIAGNOSIS — Z853 Personal history of malignant neoplasm of breast: Secondary | ICD-10-CM | POA: Diagnosis not present

## 2021-06-07 DIAGNOSIS — E785 Hyperlipidemia, unspecified: Secondary | ICD-10-CM | POA: Diagnosis not present

## 2021-06-07 DIAGNOSIS — G4733 Obstructive sleep apnea (adult) (pediatric): Secondary | ICD-10-CM | POA: Diagnosis not present

## 2021-06-07 DIAGNOSIS — I10 Essential (primary) hypertension: Secondary | ICD-10-CM | POA: Diagnosis not present

## 2021-06-08 DIAGNOSIS — Z9012 Acquired absence of left breast and nipple: Secondary | ICD-10-CM | POA: Diagnosis not present

## 2021-06-08 DIAGNOSIS — Z170 Estrogen receptor positive status [ER+]: Secondary | ICD-10-CM | POA: Diagnosis not present

## 2021-06-08 DIAGNOSIS — E119 Type 2 diabetes mellitus without complications: Secondary | ICD-10-CM | POA: Diagnosis not present

## 2021-06-08 DIAGNOSIS — D696 Thrombocytopenia, unspecified: Secondary | ICD-10-CM | POA: Diagnosis not present

## 2021-06-08 DIAGNOSIS — Z9221 Personal history of antineoplastic chemotherapy: Secondary | ICD-10-CM | POA: Diagnosis not present

## 2021-06-08 DIAGNOSIS — Z79811 Long term (current) use of aromatase inhibitors: Secondary | ICD-10-CM | POA: Diagnosis not present

## 2021-06-08 DIAGNOSIS — I10 Essential (primary) hypertension: Secondary | ICD-10-CM | POA: Diagnosis not present

## 2021-06-08 DIAGNOSIS — I4510 Unspecified right bundle-branch block: Secondary | ICD-10-CM | POA: Diagnosis not present

## 2021-06-08 DIAGNOSIS — Z853 Personal history of malignant neoplasm of breast: Secondary | ICD-10-CM | POA: Diagnosis not present

## 2021-06-08 DIAGNOSIS — G4733 Obstructive sleep apnea (adult) (pediatric): Secondary | ICD-10-CM | POA: Diagnosis not present

## 2021-06-08 DIAGNOSIS — E785 Hyperlipidemia, unspecified: Secondary | ICD-10-CM | POA: Diagnosis not present

## 2021-06-09 DIAGNOSIS — G4733 Obstructive sleep apnea (adult) (pediatric): Secondary | ICD-10-CM | POA: Diagnosis not present

## 2021-06-09 DIAGNOSIS — Z9221 Personal history of antineoplastic chemotherapy: Secondary | ICD-10-CM | POA: Diagnosis not present

## 2021-06-09 DIAGNOSIS — Z9012 Acquired absence of left breast and nipple: Secondary | ICD-10-CM | POA: Diagnosis not present

## 2021-06-09 DIAGNOSIS — Z79811 Long term (current) use of aromatase inhibitors: Secondary | ICD-10-CM | POA: Diagnosis not present

## 2021-06-09 DIAGNOSIS — E785 Hyperlipidemia, unspecified: Secondary | ICD-10-CM | POA: Diagnosis not present

## 2021-06-09 DIAGNOSIS — Z170 Estrogen receptor positive status [ER+]: Secondary | ICD-10-CM | POA: Diagnosis not present

## 2021-06-09 DIAGNOSIS — E119 Type 2 diabetes mellitus without complications: Secondary | ICD-10-CM | POA: Diagnosis not present

## 2021-06-09 DIAGNOSIS — I10 Essential (primary) hypertension: Secondary | ICD-10-CM | POA: Diagnosis not present

## 2021-06-09 DIAGNOSIS — D696 Thrombocytopenia, unspecified: Secondary | ICD-10-CM | POA: Diagnosis not present

## 2021-06-09 DIAGNOSIS — Z853 Personal history of malignant neoplasm of breast: Secondary | ICD-10-CM | POA: Diagnosis not present

## 2021-06-10 DIAGNOSIS — Z853 Personal history of malignant neoplasm of breast: Secondary | ICD-10-CM | POA: Diagnosis not present

## 2021-06-10 DIAGNOSIS — E119 Type 2 diabetes mellitus without complications: Secondary | ICD-10-CM | POA: Diagnosis not present

## 2021-06-10 DIAGNOSIS — G4733 Obstructive sleep apnea (adult) (pediatric): Secondary | ICD-10-CM | POA: Diagnosis not present

## 2021-06-10 DIAGNOSIS — Z9012 Acquired absence of left breast and nipple: Secondary | ICD-10-CM | POA: Diagnosis not present

## 2021-06-10 DIAGNOSIS — D693 Immune thrombocytopenic purpura: Secondary | ICD-10-CM | POA: Diagnosis not present

## 2021-06-10 DIAGNOSIS — D696 Thrombocytopenia, unspecified: Secondary | ICD-10-CM | POA: Diagnosis not present

## 2021-06-10 DIAGNOSIS — I10 Essential (primary) hypertension: Secondary | ICD-10-CM | POA: Diagnosis not present

## 2021-06-10 DIAGNOSIS — E785 Hyperlipidemia, unspecified: Secondary | ICD-10-CM | POA: Diagnosis not present

## 2021-06-11 DIAGNOSIS — D693 Immune thrombocytopenic purpura: Secondary | ICD-10-CM | POA: Diagnosis not present

## 2021-06-11 DIAGNOSIS — E119 Type 2 diabetes mellitus without complications: Secondary | ICD-10-CM | POA: Diagnosis not present

## 2021-06-11 DIAGNOSIS — I10 Essential (primary) hypertension: Secondary | ICD-10-CM | POA: Diagnosis not present

## 2021-06-11 DIAGNOSIS — Z853 Personal history of malignant neoplasm of breast: Secondary | ICD-10-CM | POA: Diagnosis not present

## 2021-06-11 DIAGNOSIS — E785 Hyperlipidemia, unspecified: Secondary | ICD-10-CM | POA: Diagnosis not present

## 2021-06-11 DIAGNOSIS — D696 Thrombocytopenia, unspecified: Secondary | ICD-10-CM | POA: Diagnosis not present

## 2021-06-11 DIAGNOSIS — Z9012 Acquired absence of left breast and nipple: Secondary | ICD-10-CM | POA: Diagnosis not present

## 2021-06-11 DIAGNOSIS — G4733 Obstructive sleep apnea (adult) (pediatric): Secondary | ICD-10-CM | POA: Diagnosis not present

## 2021-06-18 DIAGNOSIS — M818 Other osteoporosis without current pathological fracture: Secondary | ICD-10-CM | POA: Diagnosis not present

## 2021-06-18 DIAGNOSIS — C50912 Malignant neoplasm of unspecified site of left female breast: Secondary | ICD-10-CM | POA: Diagnosis not present

## 2021-06-20 DIAGNOSIS — G4733 Obstructive sleep apnea (adult) (pediatric): Secondary | ICD-10-CM | POA: Diagnosis not present

## 2021-06-21 DIAGNOSIS — Z170 Estrogen receptor positive status [ER+]: Secondary | ICD-10-CM | POA: Diagnosis not present

## 2021-06-21 DIAGNOSIS — K639 Disease of intestine, unspecified: Secondary | ICD-10-CM | POA: Diagnosis not present

## 2021-06-21 DIAGNOSIS — C50912 Malignant neoplasm of unspecified site of left female breast: Secondary | ICD-10-CM | POA: Diagnosis not present

## 2021-06-21 DIAGNOSIS — D696 Thrombocytopenia, unspecified: Secondary | ICD-10-CM | POA: Diagnosis not present

## 2021-06-21 DIAGNOSIS — I890 Lymphedema, not elsewhere classified: Secondary | ICD-10-CM | POA: Diagnosis not present

## 2021-06-21 DIAGNOSIS — M810 Age-related osteoporosis without current pathological fracture: Secondary | ICD-10-CM | POA: Diagnosis not present

## 2021-06-21 DIAGNOSIS — R918 Other nonspecific abnormal finding of lung field: Secondary | ICD-10-CM | POA: Diagnosis not present

## 2021-06-21 DIAGNOSIS — E538 Deficiency of other specified B group vitamins: Secondary | ICD-10-CM | POA: Diagnosis not present

## 2021-06-21 DIAGNOSIS — Z79811 Long term (current) use of aromatase inhibitors: Secondary | ICD-10-CM | POA: Diagnosis not present

## 2021-06-25 DIAGNOSIS — D696 Thrombocytopenia, unspecified: Secondary | ICD-10-CM | POA: Diagnosis not present

## 2021-06-27 ENCOUNTER — Other Ambulatory Visit: Payer: Self-pay | Admitting: Internal Medicine

## 2021-06-27 MED ORDER — GLIMEPIRIDE 2 MG PO TABS: ORAL_TABLET | ORAL | 0 refills | Status: DC

## 2021-06-27 NOTE — Telephone Encounter (Unsigned)
As per our conversation yesterday If your sugars readings are 250 and above You can take 1 mg for that day. Of  glimepiride  We can increase the dose to 2 mg if needed. This is an oral medicine that brings the blood sugar down for just that day.  It lasts the whole day.  If your blood sugar goes too low you will have to supplement with sugar drink and stop. Do not think that will happen at this low dose. But we want to avoid low blood sugar and may have to tolerate slightly elevated blood sugar during your short-term steroid therapy.  (Dosage range for this medicine is anywhere from 1 mg to 6 mg per day )  Send a message next week regarding blood sugars and medication status. Have a good weekend

## 2021-07-02 DIAGNOSIS — D696 Thrombocytopenia, unspecified: Secondary | ICD-10-CM | POA: Diagnosis not present

## 2021-07-09 DIAGNOSIS — D696 Thrombocytopenia, unspecified: Secondary | ICD-10-CM | POA: Diagnosis not present

## 2021-07-16 DIAGNOSIS — D696 Thrombocytopenia, unspecified: Secondary | ICD-10-CM | POA: Diagnosis not present

## 2021-07-19 ENCOUNTER — Other Ambulatory Visit: Payer: Self-pay | Admitting: Internal Medicine

## 2021-07-22 DIAGNOSIS — G4733 Obstructive sleep apnea (adult) (pediatric): Secondary | ICD-10-CM | POA: Diagnosis not present

## 2021-07-23 DIAGNOSIS — Z9012 Acquired absence of left breast and nipple: Secondary | ICD-10-CM | POA: Diagnosis not present

## 2021-07-23 DIAGNOSIS — C50912 Malignant neoplasm of unspecified site of left female breast: Secondary | ICD-10-CM | POA: Diagnosis not present

## 2021-07-23 DIAGNOSIS — D696 Thrombocytopenia, unspecified: Secondary | ICD-10-CM | POA: Diagnosis not present

## 2021-07-23 DIAGNOSIS — Z8739 Personal history of other diseases of the musculoskeletal system and connective tissue: Secondary | ICD-10-CM | POA: Diagnosis not present

## 2021-07-23 DIAGNOSIS — I890 Lymphedema, not elsewhere classified: Secondary | ICD-10-CM | POA: Diagnosis not present

## 2021-07-23 DIAGNOSIS — I972 Postmastectomy lymphedema syndrome: Secondary | ICD-10-CM | POA: Diagnosis not present

## 2021-07-23 DIAGNOSIS — Z79899 Other long term (current) drug therapy: Secondary | ICD-10-CM | POA: Diagnosis not present

## 2021-07-23 DIAGNOSIS — Z170 Estrogen receptor positive status [ER+]: Secondary | ICD-10-CM | POA: Diagnosis not present

## 2021-07-23 DIAGNOSIS — Z5181 Encounter for therapeutic drug level monitoring: Secondary | ICD-10-CM | POA: Diagnosis not present

## 2021-07-23 DIAGNOSIS — M818 Other osteoporosis without current pathological fracture: Secondary | ICD-10-CM | POA: Diagnosis not present

## 2021-07-23 DIAGNOSIS — Z79811 Long term (current) use of aromatase inhibitors: Secondary | ICD-10-CM | POA: Diagnosis not present

## 2021-07-23 DIAGNOSIS — C773 Secondary and unspecified malignant neoplasm of axilla and upper limb lymph nodes: Secondary | ICD-10-CM | POA: Diagnosis not present

## 2021-07-23 DIAGNOSIS — K639 Disease of intestine, unspecified: Secondary | ICD-10-CM | POA: Diagnosis not present

## 2021-07-23 DIAGNOSIS — R918 Other nonspecific abnormal finding of lung field: Secondary | ICD-10-CM | POA: Diagnosis not present

## 2021-07-23 DIAGNOSIS — M8580 Other specified disorders of bone density and structure, unspecified site: Secondary | ICD-10-CM | POA: Diagnosis not present

## 2021-07-23 DIAGNOSIS — M810 Age-related osteoporosis without current pathological fracture: Secondary | ICD-10-CM | POA: Diagnosis not present

## 2021-07-30 DIAGNOSIS — Z8739 Personal history of other diseases of the musculoskeletal system and connective tissue: Secondary | ICD-10-CM | POA: Diagnosis not present

## 2021-07-30 DIAGNOSIS — M85852 Other specified disorders of bone density and structure, left thigh: Secondary | ICD-10-CM | POA: Diagnosis not present

## 2021-07-30 DIAGNOSIS — C50912 Malignant neoplasm of unspecified site of left female breast: Secondary | ICD-10-CM | POA: Diagnosis not present

## 2021-07-30 DIAGNOSIS — Z780 Asymptomatic menopausal state: Secondary | ICD-10-CM | POA: Diagnosis not present

## 2021-07-30 DIAGNOSIS — Z5181 Encounter for therapeutic drug level monitoring: Secondary | ICD-10-CM | POA: Diagnosis not present

## 2021-07-30 DIAGNOSIS — M8589 Other specified disorders of bone density and structure, multiple sites: Secondary | ICD-10-CM | POA: Diagnosis not present

## 2021-07-30 DIAGNOSIS — Z79899 Other long term (current) drug therapy: Secondary | ICD-10-CM | POA: Diagnosis not present

## 2021-07-30 DIAGNOSIS — Z79811 Long term (current) use of aromatase inhibitors: Secondary | ICD-10-CM | POA: Diagnosis not present

## 2021-07-30 DIAGNOSIS — D696 Thrombocytopenia, unspecified: Secondary | ICD-10-CM | POA: Diagnosis not present

## 2021-08-06 DIAGNOSIS — D696 Thrombocytopenia, unspecified: Secondary | ICD-10-CM | POA: Diagnosis not present

## 2021-08-13 DIAGNOSIS — D696 Thrombocytopenia, unspecified: Secondary | ICD-10-CM | POA: Diagnosis not present

## 2021-08-20 DIAGNOSIS — N390 Urinary tract infection, site not specified: Secondary | ICD-10-CM | POA: Diagnosis not present

## 2021-08-20 DIAGNOSIS — R8290 Unspecified abnormal findings in urine: Secondary | ICD-10-CM | POA: Diagnosis not present

## 2021-08-20 DIAGNOSIS — C50912 Malignant neoplasm of unspecified site of left female breast: Secondary | ICD-10-CM | POA: Diagnosis not present

## 2021-08-20 DIAGNOSIS — D693 Immune thrombocytopenic purpura: Secondary | ICD-10-CM | POA: Diagnosis not present

## 2021-08-20 DIAGNOSIS — R918 Other nonspecific abnormal finding of lung field: Secondary | ICD-10-CM | POA: Diagnosis not present

## 2021-08-20 DIAGNOSIS — M810 Age-related osteoporosis without current pathological fracture: Secondary | ICD-10-CM | POA: Diagnosis not present

## 2021-08-20 DIAGNOSIS — I890 Lymphedema, not elsewhere classified: Secondary | ICD-10-CM | POA: Diagnosis not present

## 2021-08-20 DIAGNOSIS — Z170 Estrogen receptor positive status [ER+]: Secondary | ICD-10-CM | POA: Diagnosis not present

## 2021-08-21 DIAGNOSIS — D696 Thrombocytopenia, unspecified: Secondary | ICD-10-CM | POA: Diagnosis not present

## 2021-09-01 ENCOUNTER — Other Ambulatory Visit: Payer: Self-pay | Admitting: Internal Medicine

## 2021-09-13 ENCOUNTER — Other Ambulatory Visit: Payer: Self-pay | Admitting: Internal Medicine

## 2021-09-24 DIAGNOSIS — Z87440 Personal history of urinary (tract) infections: Secondary | ICD-10-CM | POA: Diagnosis not present

## 2021-09-24 DIAGNOSIS — M810 Age-related osteoporosis without current pathological fracture: Secondary | ICD-10-CM | POA: Diagnosis not present

## 2021-09-24 DIAGNOSIS — E538 Deficiency of other specified B group vitamins: Secondary | ICD-10-CM | POA: Diagnosis not present

## 2021-09-24 DIAGNOSIS — I890 Lymphedema, not elsewhere classified: Secondary | ICD-10-CM | POA: Diagnosis not present

## 2021-09-24 DIAGNOSIS — Z170 Estrogen receptor positive status [ER+]: Secondary | ICD-10-CM | POA: Diagnosis not present

## 2021-09-24 DIAGNOSIS — D693 Immune thrombocytopenic purpura: Secondary | ICD-10-CM | POA: Diagnosis not present

## 2021-09-24 DIAGNOSIS — Z79811 Long term (current) use of aromatase inhibitors: Secondary | ICD-10-CM | POA: Diagnosis not present

## 2021-09-24 DIAGNOSIS — R918 Other nonspecific abnormal finding of lung field: Secondary | ICD-10-CM | POA: Diagnosis not present

## 2021-09-24 DIAGNOSIS — C773 Secondary and unspecified malignant neoplasm of axilla and upper limb lymph nodes: Secondary | ICD-10-CM | POA: Diagnosis not present

## 2021-09-24 DIAGNOSIS — C50912 Malignant neoplasm of unspecified site of left female breast: Secondary | ICD-10-CM | POA: Diagnosis not present

## 2021-09-24 DIAGNOSIS — Z23 Encounter for immunization: Secondary | ICD-10-CM | POA: Diagnosis not present

## 2021-10-08 ENCOUNTER — Telehealth (INDEPENDENT_AMBULATORY_CARE_PROVIDER_SITE_OTHER): Payer: Medicare Other | Admitting: Internal Medicine

## 2021-10-08 ENCOUNTER — Encounter: Payer: Self-pay | Admitting: Internal Medicine

## 2021-10-08 ENCOUNTER — Telehealth: Payer: Self-pay | Admitting: Internal Medicine

## 2021-10-08 DIAGNOSIS — Z79899 Other long term (current) drug therapy: Secondary | ICD-10-CM

## 2021-10-08 DIAGNOSIS — J0101 Acute recurrent maxillary sinusitis: Secondary | ICD-10-CM

## 2021-10-08 DIAGNOSIS — D693 Immune thrombocytopenic purpura: Secondary | ICD-10-CM | POA: Diagnosis not present

## 2021-10-08 MED ORDER — CEFDINIR 300 MG PO CAPS: 300.0000 mg | ORAL_CAPSULE | Freq: Two times a day (BID) | ORAL | 0 refills | Status: DC

## 2021-10-08 NOTE — Progress Notes (Signed)
Virtual Visit via Video Note  I connected with Chelsey [name] on 10/08/21 at 11:30 AM EST by a video enabled telemedicine application and verified that I am speaking with the correct person using two identifiers. Location patient: home Location provider:work office Persons participating in the virtual visit: patient, provider  [place] national recommendations  regarding COVID 19 pandemic   video visit is advised over in office visit for this patient.  Patient aware  of the limitations of evaluation and management by telemedicine and  availability of in person appointments. and agreed to proceed.   HPI: Chelsey [name] presents for video visit SDA because of sinus infection.  Onset 2 to 3 days ago of upper respiratory congestion and now left facial pain paranasal pain sinus infection that she used to have quite a bit in the past. She was recently treated for severe ITP her platelets have been stable but infection tends to trigger a lowering of her platelet count. Her granddaughter age 60 had a sinus infection with respiratory symptoms and sneezing exposure recently Chelsey [name] did do a home COVID test 2 days ago was negative. No significant coughing fever shortness of breath.  Nasal discharge now is discolored and some blood on the left.  ROS: See pertinent positives and negatives per HPI. Was able to take Keflex a  year ago for a finger infection without side effect reported Recently treated for UTI.  Cipro Past Medical History:  Diagnosis Date   Allergy    Aphthous ulcer of mouth 2010   related to chemotherapy    Breast cancer ([place])    Carpal tunnel syndrome of right wrist    Colitis    Colon polyp    Diabetes mellitus    Diverticulosis    GERD (gastroesophageal reflux disease)    Hyperlipidemia    Hypertension    Lymphedema 08-31-13   left arm   Osteoarthritis    Pneumonia 07/28/2011   Sinusitis    Sleep apnea    setting of 10   Squamous cell carcinoma    left leg    Past  Surgical History:  Procedure Laterality Date   AUGMENTATION MAMMAPLASTY Left    CESAREAN SECTION     x 3   MASTECTOMY Left 11/23   2010   RECONSTRUCTION BREAST IMMEDIATE / DELAYED W/ TISSUE EXPANDER Left    REDUCTION MAMMAPLASTY Right    SKIN GRAFT     SQUAMOUS CELL CARCINOMA EXCISION Left    Left lower extremity   TONSILLECTOMY     as child   TOTAL KNEE ARTHROPLASTY Left 12/20/2012   Procedure: TOTAL KNEE ARTHROPLASTY;  Surgeon: Gearlean Alf, MD;  Location: [facility];  Service: Orthopedics;  Laterality: Left;   TOTAL KNEE ARTHROPLASTY Right 09/12/2013   Procedure: RIGHT TOTAL KNEE ARTHROPLASTY;  Surgeon: Gearlean Alf, MD;  Location: [facility];  Service: Orthopedics;  Laterality: Right;   TUBAL LIGATION      Family History  Problem Relation Age of Onset   Alzheimer's disease Mother    Sleep apnea Mother    Diabetes Mother    Hypertension Mother    Hyperlipidemia Mother    Breast cancer Mother 71       triple negative   Anxiety disorder Daughter    Hypertension Son        pulmonary   Congenital heart disease Son 4       TGA  died age 88     Social History   Tobacco Use  Smoking status: Never   Smokeless tobacco: Never  Vaping Use   Vaping Use: Never used  Substance Use Topics   Alcohol use: Yes    Alcohol/week: 6.0 standard drinks    Types: 6 Glasses of wine per week    Comment: 1-2 glasses every other night   Drug use: No      Current Outpatient Medications:    ALPRAZolam (XANAX) 0.25 MG tablet, TAKE 1 TABLET(0.25 MG) BY MOUTH TWICE DAILY AS NEEDED FOR ANXIETY. AVOID REGULAR USE, Disp: 30 tablet, Rfl: 0   anastrozole (ARIMIDEX) 1 MG tablet, Take 1 tablet (1 mg total) by mouth daily., Disp: 90 tablet, Rfl: 5   azelastine (ASTELIN) 0.1 % nasal spray, INSTILL 1 TO 2 SPRAYS INTO EACH NOSTRIL TWICE DAILY AS NEEDED, Disp: 30 mL, Rfl: 5   cefdinir (OMNICEF) 300 MG capsule, Take 1 capsule (300 mg total) by mouth 2 (two) times daily. For sinusitis, Disp: 14 capsule,  Rfl: 0   dapagliflozin propanediol (FARXIGA) 5 MG TABS tablet, Take 1 tablet (5 mg total) by mouth daily before breakfast., Disp: 30 tablet, Rfl: 3   denosumab (PROLIA) 60 MG/ML SOLN injection, Inject 60 mg into the skin every 6 (six) months. Administer in upper arm, thigh, or abdomen, Disp: , Rfl:    esomeprazole (NEXIUM) 40 MG capsule, Take 40 mg by mouth as needed. Takes bedtime, Disp: , Rfl:    glimepiride (AMARYL) 2 MG tablet, Take 1 -2 mg per day for blood sugar 250 and above. As directed when on steroids, Disp: 30 tablet, Rfl: 0   losartan (COZAAR) 50 MG tablet, TAKE 1 TABLET(50 MG) BY MOUTH DAILY, Disp: 90 tablet, Rfl: 2   metFORMIN (GLUCOPHAGE-XR) 500 MG 24 hr tablet, TAKE 2 TABLETS BY MOUTH TWICE DAILY WITH MEALS, Disp: 360 tablet, Rfl: 1   rosuvastatin (CRESTOR) 10 MG tablet, TAKE 1 TABLET BY MOUTH EVERY DAY, Disp: 90 tablet, Rfl: 1   sertraline (ZOLOFT) 25 MG tablet, TAKE 1 TABLET BY MOUTH EVERY DAY, Disp: 90 tablet, Rfl: 0   traZODone (DESYREL) 50 MG tablet, TAKE 1/2 TO 1 TABLET(25 TO 50 MG) BY MOUTH AT BEDTIME AS NEEDED FOR SLEEP, Disp: 30 tablet, Rfl: 3  EXAM: BP Readings from Last 3 Encounters:  06/03/21 120/66  01/21/21 110/60  12/24/20 130/68    VITALS per patient if applicable:  GENERAL: alert, oriented, appears well and in no acute distress obvious congestion no facial edema touches left maxillary teeth area of tenderness.  HEENT: atraumatic, conjunttiva clear, no obvious abnormalities on inspection of external nose and ears  NECK: normal movements of the head and neck  LUNGS: on inspection no signs of respiratory distress, breathing rate appears normal, no obvious gross SOB, gasping or wheezing  CV: no obvious cyanosis  MS: moves all visible extremities without noticeable abnormality  PSYCH/NEURO: pleasant and cooperative, no obvious depression or anxiety, speech and thought processing grossly intact  Last platelet count was about 162.K  FYI she is off  prednisone and monitor. ASSESSMENT AND PLAN:  Discussed the following assessment and plan:    ICD-10-CM   1. Acute recurrent maxillary sinusitis  J01.01    left sx    2. Medication management  Z79.899     3. Idiopathic thrombocytopenic purpura (ITP) (HCC)  D69.3    recent nl plt count     Record review she is to get blood work done today for her platelet count. Record review shows no untoward side effects to the Keflex given year  ago. Rx Omnicef 300 twice daily for 7 days sinus hygiene saline Primary trigger may have been viral but currently appears to be localized and risk for bacterial Fortunately she is not on prednisone anymore and doing better. No active bleeding except some blood through the left nostril.  Counseled.   Expectant management and discussion of plan and treatment with opportunity to ask questions and all were answered. The patient agreed with the plan and demonstrated an understanding of the instructions.   Advised to call back or seek an in-person evaluation if worsening  or having  further concerns  in interim. Return if symptoms worsen or fail to improve as expected.    Shanon Ace, MD

## 2021-10-08 NOTE — Telephone Encounter (Signed)
Patient contacted me over weekend about sinus infection.  Please add her on to schedule at 11:30 for a virtual appt and let her know  about this appt .

## 2021-10-11 NOTE — Telephone Encounter (Signed)
Done

## 2021-10-21 DIAGNOSIS — G4733 Obstructive sleep apnea (adult) (pediatric): Secondary | ICD-10-CM | POA: Diagnosis not present

## 2021-10-22 DIAGNOSIS — D696 Thrombocytopenia, unspecified: Secondary | ICD-10-CM | POA: Diagnosis not present

## 2021-11-05 DIAGNOSIS — D693 Immune thrombocytopenic purpura: Secondary | ICD-10-CM | POA: Diagnosis not present

## 2021-11-05 DIAGNOSIS — R918 Other nonspecific abnormal finding of lung field: Secondary | ICD-10-CM | POA: Diagnosis not present

## 2021-11-05 DIAGNOSIS — D696 Thrombocytopenia, unspecified: Secondary | ICD-10-CM | POA: Diagnosis not present

## 2021-11-05 DIAGNOSIS — I890 Lymphedema, not elsewhere classified: Secondary | ICD-10-CM | POA: Diagnosis not present

## 2021-11-05 DIAGNOSIS — Z79811 Long term (current) use of aromatase inhibitors: Secondary | ICD-10-CM | POA: Diagnosis not present

## 2021-11-05 DIAGNOSIS — C50912 Malignant neoplasm of unspecified site of left female breast: Secondary | ICD-10-CM | POA: Diagnosis not present

## 2021-11-05 DIAGNOSIS — M810 Age-related osteoporosis without current pathological fracture: Secondary | ICD-10-CM | POA: Diagnosis not present

## 2021-11-05 DIAGNOSIS — Z170 Estrogen receptor positive status [ER+]: Secondary | ICD-10-CM | POA: Diagnosis not present

## 2021-11-05 DIAGNOSIS — Z9012 Acquired absence of left breast and nipple: Secondary | ICD-10-CM | POA: Diagnosis not present

## 2021-11-19 DIAGNOSIS — Z9889 Other specified postprocedural states: Secondary | ICD-10-CM | POA: Diagnosis not present

## 2021-12-05 DIAGNOSIS — D696 Thrombocytopenia, unspecified: Secondary | ICD-10-CM | POA: Diagnosis not present

## 2021-12-16 DIAGNOSIS — H10413 Chronic giant papillary conjunctivitis, bilateral: Secondary | ICD-10-CM | POA: Diagnosis not present

## 2021-12-16 DIAGNOSIS — H04123 Dry eye syndrome of bilateral lacrimal glands: Secondary | ICD-10-CM | POA: Diagnosis not present

## 2021-12-16 DIAGNOSIS — H5213 Myopia, bilateral: Secondary | ICD-10-CM | POA: Diagnosis not present

## 2021-12-16 DIAGNOSIS — H40013 Open angle with borderline findings, low risk, bilateral: Secondary | ICD-10-CM | POA: Diagnosis not present

## 2021-12-16 DIAGNOSIS — Z961 Presence of intraocular lens: Secondary | ICD-10-CM | POA: Diagnosis not present

## 2021-12-27 NOTE — Progress Notes (Signed)
HPI ?female never smoker here with husband. History OSA/CPAP, complicated by history breast cancer, HBP, allergic rhinitis, ITP,  ?NPSG 05/06/19 AHI 47.7/hr,  ? ?----------------------------------------------------------------------------------------------------- ? ? ?12/24/20- 76 year old female never smoker followed for history OSA/CPAP, Breast Cancer L, HTN, allergicRrhinitis, ? DM 2, GERD,  ? CPAP auto 5-15/ [facility] ?Download- compliance 100%, AHI 2.1/ hr ?Body weight today-193 lbs ?Covid vax-3 Phizer ?Flu vax-had ?-----Patient wants to see about getting antibiotic for sinus infection, and had CT scan done at [facility] ?CT report from [facility] 12/04/2020- Stable small nodules and radiation changes.  No cancer recurrence or mets. ? ?12/30/21- 76 year old female never smoker followed for history OSA/CPAP, Breast Cancer L, HTN, allergicRrhinitis, ITP,  ? DM 2, GERD,  ?-Trazodone 50 ? CPAP auto 5-15/ [facility] ?Download- compliance 100%, AHI 1.5/ hr ?Body weight today- ?Covid vax-3 Phizer, 1 Moderna ?Flu vax-had ?After a viral respiratory syndrome she developed ITP and was hospitalized [facility].  Still being followed by her oncologist with frequent blood work but platelet counts have come up. ?She continues annual CT ordered by her oncologist and will have results sent to [place] for history of lung nodule. ?Feels she cannot sleep/breathe at night without her CPAP but would like refitting of mask and asks referral.  Download reviewed showing good compliance and control. ?She has a mild dry cough that she and her husband refer to as "habit". ? ?ROS-see HPI     + = positive ?Constitutional:   No-   weight loss, night sweats, + fevers, chills, fatigue, lassitude. ?HEENT:   + headaches, difficulty swallowing, tooth/dental problems, sore throat,  ?     No-  sneezing, +itching, ear ache, +nasal congestion, +post nasal drip,  ?CV:  No-   chest pain, orthopnea, PND, swelling in lower extremities, anasarca, dizziness,  palpitations ?Resp: No-   shortness of breath with exertion or at rest.   ?         productive cough,  No non-productive cough,  No- coughing up of blood.   ?        change in color of mucus.  No- wheezing.   ?Skin: No-   rash or lesions. ?GI:  No-   heartburn, indigestion, abdominal pain, nausea, vomiting,  ?GU:  ?MS:  + joint pain or swelling.   ?Neuro-     nothing unusual ?Psych:  No- change in mood or affect. No depression or anxiety.  No memory loss. ? ?OBJ- Physical Exam    ?General- Alert, Oriented, Affect-appropriate, Distress- none acute, + overweight ?Skin- rash-none, lesions- none, excoriation- none ?Lymphadenopathy- none ?Head- atraumatic ?           Eyes- Gross vision intact, PERRLA, conjunctivae and secretions clear, ? + mild proptosis ?           Ears- Hearing, canals-normal ?           Nose- + prominent turbinates, Septal dev +mild, no-mucus, polyps, erosion, perforation  ?           Throat- Mallampati III-IV, mucosa clear , drainage- none, tonsils- atrophic, own teeth ?Neck- flexible , trachea midline, no stridor , thyroid nl, carotid no bruit ?Chest - symmetrical excursion , unlabored ?          Heart/CV- RRR , no murmur , no gallop  , no rub, nl s1 s2 ?                          - JVD-  none , edema- none, stasis changes- none, varices- none ?          Lung- clear, wheeze- none, cough+ mild dry throat clearing, dullness-none, rub- none ?          Chest wall-  ?Abd-  ?Br/ Gen/ Rectal- Not done, not indicated ?Extrem- cyanosis- none, clubbing, none, atrophy- none, strength- nl ?Neuro- grossly intact to observation ? ? ?

## 2021-12-30 ENCOUNTER — Encounter: Payer: Self-pay | Admitting: Internal Medicine

## 2021-12-30 ENCOUNTER — Ambulatory Visit: Payer: Medicare Other | Admitting: Internal Medicine

## 2021-12-30 ENCOUNTER — Other Ambulatory Visit: Payer: Self-pay

## 2021-12-30 VITALS — BP 122/70 | HR 59 | Temp 98.0°F | Ht 63.5 in | Wt 181.2 lb

## 2021-12-30 DIAGNOSIS — G4733 Obstructive sleep apnea (adult) (pediatric): Secondary | ICD-10-CM

## 2021-12-30 DIAGNOSIS — R918 Other nonspecific abnormal finding of lung field: Secondary | ICD-10-CM

## 2021-12-30 MED ORDER — AZELASTINE HCL 0.1 % NA SOLN: NASAL | 12 refills | Status: DC

## 2021-12-30 NOTE — Patient Instructions (Signed)
Order- DME [facility]- please replace old CPAP machine auto 5-15, mask of choice, humidifier, supplies, AirView/ card ? ?Order- schedule mask fitting at [facility] ?

## 2021-12-30 NOTE — Assessment & Plan Note (Signed)
Annual CT ordered by her oncologist at [facility] and patient says a copy of report is being sent to me. ?

## 2021-12-30 NOTE — Assessment & Plan Note (Signed)
Benefits from CPAP with good compliance and control. ?Plan-replace old machine, continuing auto 5-15.  Referral to sleep center for mask fitting at her request. ?

## 2022-01-01 DIAGNOSIS — D696 Thrombocytopenia, unspecified: Secondary | ICD-10-CM | POA: Diagnosis not present

## 2022-01-01 DIAGNOSIS — L720 Epidermal cyst: Secondary | ICD-10-CM | POA: Diagnosis not present

## 2022-01-08 ENCOUNTER — Ambulatory Visit (HOSPITAL_BASED_OUTPATIENT_CLINIC_OR_DEPARTMENT_OTHER): Payer: Medicare Other | Attending: Internal Medicine | Admitting: Internal Medicine

## 2022-01-08 DIAGNOSIS — G4733 Obstructive sleep apnea (adult) (pediatric): Secondary | ICD-10-CM

## 2022-01-10 ENCOUNTER — Other Ambulatory Visit: Payer: Self-pay | Admitting: Internal Medicine

## 2022-01-14 DIAGNOSIS — Z96651 Presence of right artificial knee joint: Secondary | ICD-10-CM | POA: Diagnosis not present

## 2022-01-14 DIAGNOSIS — R269 Unspecified abnormalities of gait and mobility: Secondary | ICD-10-CM | POA: Diagnosis not present

## 2022-01-14 DIAGNOSIS — G4733 Obstructive sleep apnea (adult) (pediatric): Secondary | ICD-10-CM | POA: Diagnosis not present

## 2022-01-14 NOTE — Telephone Encounter (Signed)
LVM instructions for pt to call & schedule cpe. Also need to know if patient still taking Trazodone.  ?

## 2022-01-20 DIAGNOSIS — M25561 Pain in right knee: Secondary | ICD-10-CM | POA: Diagnosis not present

## 2022-01-21 ENCOUNTER — Ambulatory Visit (AMBULATORY_SURGERY_CENTER): Payer: Medicare Other | Admitting: *Deleted

## 2022-01-21 VITALS — Ht 63.0 in | Wt 182.0 lb

## 2022-01-21 DIAGNOSIS — Z86010 Personal history of colonic polyps: Secondary | ICD-10-CM

## 2022-01-21 MED ORDER — PLENVU 140 G PO SOLR: 1.0000 | Freq: Once | ORAL | 0 refills | Status: AC

## 2022-01-21 NOTE — Progress Notes (Signed)
Patient's pre-visit was done today over the phone with the patient. Name,DOB and address verified. Patient denies any allergies to Eggs and Soy. Patient denies any problems with anesthesia/sedation. Patient is not taking any diet pills or blood thinners. No home Oxygen. Insurance confirmed with patient. ? ?Prep instructions sent to pt's MyChart (if available) & mailed to pt-pt is aware. Patient understands to call us back with any questions or concerns. Patient is aware of our care-partner policy. [employer] medicare coupon and copy of consent mailed to pt. Pt will let her dr know she is having the colon and will call us back if platelets are too low for the procedure she will get them checked 4/18. ? ?The patient is COVID-19 vaccinated.   ?

## 2022-01-24 DIAGNOSIS — R590 Localized enlarged lymph nodes: Secondary | ICD-10-CM | POA: Diagnosis not present

## 2022-01-24 DIAGNOSIS — R918 Other nonspecific abnormal finding of lung field: Secondary | ICD-10-CM | POA: Diagnosis not present

## 2022-01-24 DIAGNOSIS — Z9012 Acquired absence of left breast and nipple: Secondary | ICD-10-CM | POA: Diagnosis not present

## 2022-01-24 DIAGNOSIS — C50912 Malignant neoplasm of unspecified site of left female breast: Secondary | ICD-10-CM | POA: Diagnosis not present

## 2022-01-24 DIAGNOSIS — Z170 Estrogen receptor positive status [ER+]: Secondary | ICD-10-CM | POA: Diagnosis not present

## 2022-01-24 DIAGNOSIS — M899 Disorder of bone, unspecified: Secondary | ICD-10-CM | POA: Diagnosis not present

## 2022-01-24 DIAGNOSIS — C50412 Malignant neoplasm of upper-outer quadrant of left female breast: Secondary | ICD-10-CM | POA: Diagnosis not present

## 2022-01-28 DIAGNOSIS — M818 Other osteoporosis without current pathological fracture: Secondary | ICD-10-CM | POA: Diagnosis not present

## 2022-02-03 ENCOUNTER — Encounter: Payer: Self-pay | Admitting: Gastroenterology

## 2022-02-05 ENCOUNTER — Encounter: Payer: Self-pay | Admitting: Certified Registered Nurse Anesthetist

## 2022-02-10 DIAGNOSIS — C50912 Malignant neoplasm of unspecified site of left female breast: Secondary | ICD-10-CM | POA: Diagnosis not present

## 2022-02-10 DIAGNOSIS — Z9011 Acquired absence of right breast and nipple: Secondary | ICD-10-CM | POA: Diagnosis not present

## 2022-02-12 ENCOUNTER — Encounter: Payer: Self-pay | Admitting: Gastroenterology

## 2022-02-12 ENCOUNTER — Ambulatory Visit (AMBULATORY_SURGERY_CENTER): Payer: Medicare Other | Admitting: Gastroenterology

## 2022-02-12 VITALS — BP 137/67 | HR 61 | Temp 97.8°F | Resp 14 | Ht 63.0 in | Wt 182.0 lb

## 2022-02-12 DIAGNOSIS — D122 Benign neoplasm of ascending colon: Secondary | ICD-10-CM

## 2022-02-12 DIAGNOSIS — G4730 Sleep apnea, unspecified: Secondary | ICD-10-CM | POA: Diagnosis not present

## 2022-02-12 DIAGNOSIS — Z86010 Personal history of colonic polyps: Secondary | ICD-10-CM | POA: Diagnosis not present

## 2022-02-12 DIAGNOSIS — K635 Polyp of colon: Secondary | ICD-10-CM

## 2022-02-12 DIAGNOSIS — D123 Benign neoplasm of transverse colon: Secondary | ICD-10-CM | POA: Diagnosis not present

## 2022-02-12 DIAGNOSIS — D120 Benign neoplasm of cecum: Secondary | ICD-10-CM | POA: Diagnosis not present

## 2022-02-12 DIAGNOSIS — E119 Type 2 diabetes mellitus without complications: Secondary | ICD-10-CM | POA: Diagnosis not present

## 2022-02-12 DIAGNOSIS — E785 Hyperlipidemia, unspecified: Secondary | ICD-10-CM | POA: Diagnosis not present

## 2022-02-12 DIAGNOSIS — I10 Essential (primary) hypertension: Secondary | ICD-10-CM | POA: Diagnosis not present

## 2022-02-12 MED ORDER — SODIUM CHLORIDE 0.9 % IV SOLN: 500.0000 mL | Freq: Once | INTRAVENOUS | Status: AC

## 2022-02-12 NOTE — Op Note (Signed)
[facility] ?Patient Name: Chelsey [name] ?Procedure Date: 02/12/2022 9:25 AM ?MRN: [id number] ?Endoscopist: Thornton Park MD, MD ?Age: 76 ?Referring MD:  ?Date of Birth: [birth year] ?Gender: Female ?Account #: [id number] ?Procedure:                Colonoscopy ?Indications:              Surveillance: Personal history of adenomatous  ?                          polyps on last colonoscopy 3 years ago ?                          Multiple tubular adenomas and a sessile serrated  ?                          polyp removed on colonoscopy 2020 ?Medicines:                Monitored Anesthesia Care ?Procedure:                Pre-Anesthesia Assessment: ?                          - Prior to the procedure, a History and Physical  ?                          was performed, and patient medications and  ?                          allergies were reviewed. The patient's tolerance of  ?                          previous anesthesia was also reviewed. The risks  ?                          and benefits of the procedure and the sedation  ?                          options and risks were discussed with the patient.  ?                          All questions were answered, and informed consent  ?                          was obtained. Prior Anticoagulants: The patient has  ?                          taken no previous anticoagulant or antiplatelet  ?                          agents. ASA Grade Assessment: II - A patient with  ?                          mild systemic disease. After reviewing the risks  ?  and benefits, the patient was deemed in  ?                          satisfactory condition to undergo the procedure. ?                          After obtaining informed consent, the colonoscope  ?                          was passed under direct vision. Throughout the  ?                          procedure, the patient's blood pressure, pulse, and  ?                          oxygen saturations were monitored continuously.  The  ?                          Olympus PCF-H190DL (#[id number]) Colonoscope was  ?                          introduced through the anus and advanced to the 3  ?                          cm into the ileum. A second forward view of the  ?                          right colon. The colonoscopy was performed with  ?                          limited difficulty due to looping. The patient  ?                          tolerated the procedure well. The quality of the  ?                          bowel preparation was good. The terminal ileum,  ?                          ileocecal valve, appendiceal orifice, and rectum  ?                          were photographed. ?Scope In: 9:43:33 AM ?Scope Out: 43:15:40 AM ?Scope Withdrawal Time: 0 hours 12 minutes 55 seconds  ?Total Procedure Duration: 0 hours 22 minutes 35 seconds  ?Findings:                 The perianal and digital rectal examinations were  ?                          normal. ?                          Multiple small and large-mouthed diverticula were  ?  found in the sigmoid colon and descending colon. ?                          A 3 mm possible polyp was found in the splenic  ?                          flexure. The polyp was flat. The polyp was removed  ?                          with a cold snare. Resection and retrieval were  ?                          complete. Estimated blood loss was minimal. ?                          A 4 mm polyp was found in the ascending colon. The  ?                          polyp was sessile. The polyp was removed with a  ?                          cold snare. Resection and retrieval were complete.  ?                          Estimated blood loss was minimal. ?                          A 3 mm polyp was found in the cecum. The polyp was  ?                          sessile. The polyp was removed with a cold snare.  ?                          Resection and retrieval were complete. Estimated  ?                          blood  loss was minimal. ?                          The colon is tortuous and redundant. The exam was  ?                          otherwise without abnormality on direct and  ?                          retroflexion views. ?Complications:            No immediate complications. ?Estimated Blood Loss:     Estimated blood loss was minimal. ?Impression:               - Diverticulosis in the sigmoid colon and in the  ?  descending colon. ?                          - One 3 mm possible polyp at the splenic flexure,  ?                          removed with a cold snare. Resected and retrieved. ?                          - One 4 mm polyp in the ascending colon, removed  ?                          with a cold snare. Resected and retrieved. ?                          - One 3 mm polyp in the cecum, removed with a cold  ?                          snare. Resected and retrieved. ?                          - The examination was otherwise normal on direct  ?                          and retroflexion views. ?Recommendation:           - Patient has a contact number available for  ?                          emergencies. The signs and symptoms of potential  ?                          delayed complications were discussed with the  ?                          patient. Return to normal activities tomorrow.  ?                          Written discharge instructions were provided to the  ?                          patient. ?                          - High fiber diet. ?                          - Continue present medications. ?                          - Await pathology results. ?                          - Over age 32, the risks and benefits of  ?  colonoscopy for colon cancer screening are  ?                          carefully considered. We could consider another  ?                          colonoscopy based on the results of the polyps  ?                          removed today if it is clinically  appropriate at  ?                          that time. However, the overall risk for colon  ?                          cancer is very low. ?                          - Emerging evidence supports eating a diet of  ?                          fruits, vegetables, grains, calcium, and yogurt  ?                          while reducing red meat and alcohol may reduce the  ?                          risk of colon cancer. ?                          - Thank you for allowing me to be involved in your  ?                          colon cancer prevention. ?Thornton Park MD, MD ?02/12/2022 10:20:24 AM ?This report has been signed electronically. ?

## 2022-02-12 NOTE — Patient Instructions (Signed)
? ?Handout on polyps ,diverticulosis,& high fiber diet given to you today ? ?Await pathology results on polyps removed  ? ? ?YOU HAD AN ENDOSCOPIC PROCEDURE TODAY AT [facility]:   Refer to the procedure report that was given to you for any specific questions about what was found during the examination.  If the procedure report does not answer your questions, please call your gastroenterologist to clarify.  If you requested that your care partner not be given the details of your procedure findings, then the procedure report has been included in a sealed envelope for you to review at your convenience later. ? ?YOU SHOULD EXPECT: Some feelings of bloating in the abdomen. Passage of more gas than usual.  Walking can help get rid of the air that was put into your GI tract during the procedure and reduce the bloating. If you had a lower endoscopy (such as a colonoscopy or flexible sigmoidoscopy) you may notice spotting of blood in your stool or on the toilet paper. If you underwent a bowel prep for your procedure, you may not have a normal bowel movement for a few days. ? ?Please Note:  You might notice some irritation and congestion in your nose or some drainage.  This is from the oxygen used during your procedure.  There is no need for concern and it should clear up in a day or so. ? ?SYMPTOMS TO REPORT IMMEDIATELY: ? ?Following lower endoscopy (colonoscopy or flexible sigmoidoscopy): ? Excessive amounts of blood in the stool ? Significant tenderness or worsening of abdominal pains ? Swelling of the abdomen that is new, acute ? Fever of 100?F or higher ? ? ?For urgent or emergent issues, a gastroenterologist can be reached at any hour by calling [phone]. ?Do not use MyChart messaging for urgent concerns.  ? ? ?DIET:  We do recommend a small meal at first, but then you may proceed to your regular diet.  Drink plenty of fluids but you should avoid alcoholic beverages for 24 hours. ? ?ACTIVITY:   You should plan to take it easy for the rest of today and you should NOT DRIVE or use heavy machinery until tomorrow (because of the sedation medicines used during the test).   ? ?FOLLOW UP: ?Our staff will call the number listed on your records 48-72 hours following your procedure to check on you and address any questions or concerns that you may have regarding the information given to you following your procedure. If we do not reach you, we will leave a message.  We will attempt to reach you two times.  During this call, we will ask if you have developed any symptoms of COVID 19. If you develop any symptoms (ie: fever, flu-like symptoms, shortness of breath, cough etc.) before then, please call [phone].  If you test positive for Covid 19 in the 2 weeks post procedure, please call and report this information to [place].   ? ?If any biopsies were taken you will be contacted by phone or by letter within the next 1-3 weeks.  Please call us at [phone] if you have not heard about the biopsies in 3 weeks.  ? ? ?SIGNATURES/CONFIDENTIALITY: ?You and/or your care partner have signed paperwork which will be entered into your electronic medical record.  These signatures attest to the fact that that the information above on your After Visit Summary has been reviewed and is understood.  Full responsibility of the confidentiality of this discharge information lies with you  and/or your care-partner.  ? ? ? ? ? ?

## 2022-02-12 NOTE — Progress Notes (Signed)
Called to room to assist during endoscopic procedure.  Patient ID and intended procedure confirmed with present staff. Received instructions for my participation in the procedure from the performing physician.  

## 2022-02-12 NOTE — Progress Notes (Signed)
VS completed by DT.  Pt's states no medical or surgical changes since previsit or office visit.  

## 2022-02-12 NOTE — Progress Notes (Signed)
? ?Referring Provider: Burnis Medin, MD ?Primary Care Physician:  Burnis Medin, MD ? ?Indication for Procedure:  Colon cancer Surveillance ? ? ?IMPRESSION:  ?History of colon polyps ?Need for colon cancer surveillance ?Appropriate candidate for monitored anesthesia care ? ?PLAN: ?Colonoscopy in the [place] today ? ? ?HPI: Chelsey [name] is a 76 y.o. female presents for surveillance colonoscopy. ? ?Prior endoscopic history: ?Colonoscopy 08/17/2013: ?-Sigmoid diverticulosis ? ?Colonoscopy 12/02/18: ?- Diverticulosis in the sigmoid colon and in the descending colon. ?- Nodular mucosa at the appendiceal orifice. Biopsied. ?- Six 3 to 9 mm flat polyps in the descending colon and in the ascending colon, removed with ?a cold snare. Resected and retrieved. ?- No mass seen in the right colon. The CT scan findings may be due to her appendix. ?- The examination was otherwise normal on direct and retroflexion views. ? ?Pathology showed an SSP and tubular adenomas.  Surveillance colonoscopy recommended in 3 years. ? ?No baseline GI symptoms.  ? ?No known family history of colon cancer or polyps. No family history of uterine/endometrial cancer, pancreatic cancer or gastric/stomach cancer. ? ? ?Past Medical History:  ?Diagnosis Date  ? Allergy   ? Aphthous ulcer of mouth 2010  ? related to chemotherapy   ? Breast cancer [facility]) 2010  ? 2010 & 2011  ? Carpal tunnel syndrome of right wrist   ? Colitis   ? Colon polyp   ? Diabetes mellitus   ? Diverticulosis   ? GERD (gastroesophageal reflux disease)   ? Hyperlipidemia   ? Hypertension   ? Lymphedema 08/31/2013  ? left arm  ? Osteoarthritis   ? Pneumonia 07/28/2011  ? Sinusitis   ? Sleep apnea   ? CPAP-setting of 10  ? Squamous cell carcinoma   ? left leg  ? ? ?Past Surgical History:  ?Procedure Laterality Date  ? AUGMENTATION MAMMAPLASTY Left   ? CESAREAN SECTION    ? x 3  ? COLONOSCOPY  12/02/2018  ? Dr.Baylee Campus  ? MASTECTOMY Left 11/23  ? 2010  ? RECONSTRUCTION BREAST IMMEDIATE /  DELAYED W/ TISSUE EXPANDER Left   ? REDUCTION MAMMAPLASTY Right   ? SKIN GRAFT    ? SQUAMOUS CELL CARCINOMA EXCISION Left   ? Left lower extremity  ? TONSILLECTOMY    ? as child  ? TOTAL KNEE ARTHROPLASTY Left 12/20/2012  ? Procedure: TOTAL KNEE ARTHROPLASTY;  Surgeon: Gearlean Alf, MD;  Location: [facility];  Service: Orthopedics;  Laterality: Left;  ? TOTAL KNEE ARTHROPLASTY Right 09/12/2013  ? Procedure: RIGHT TOTAL KNEE ARTHROPLASTY;  Surgeon: Gearlean Alf, MD;  Location: [facility];  Service: Orthopedics;  Laterality: Right;  ? TUBAL LIGATION    ? ? ?Current Outpatient Medications  ?Medication Sig Dispense Refill  ? ALPRAZolam (XANAX) 0.25 MG tablet TAKE 1 TABLET(0.25 MG) BY MOUTH TWICE DAILY AS NEEDED FOR ANXIETY. AVOID REGULAR USE 30 tablet 0  ? cyanocobalamin 1000 MCG tablet Take 1 tablet by mouth daily.    ? losartan (COZAAR) 50 MG tablet TAKE 1 TABLET(50 MG) BY MOUTH DAILY 90 tablet 2  ? metFORMIN (GLUCOPHAGE-XR) 500 MG 24 hr tablet TAKE 2 TABLETS BY MOUTH TWICE DAILY WITH MEALS 360 tablet 1  ? rosuvastatin (CRESTOR) 10 MG tablet TAKE 1 TABLET BY MOUTH EVERY DAY 90 tablet 1  ? traZODone (DESYREL) 50 MG tablet TAKE 1/2 TO 1 TABLET(25 TO 50 MG) BY MOUTH AT BEDTIME AS NEEDED FOR SLEEP 30 tablet 0  ? anastrozole (ARIMIDEX) 1 MG tablet  Take 1 tablet (1 mg total) by mouth daily. (Patient not taking: Reported on 02/12/2022) 90 tablet 5  ? azelastine (ASTELIN) 0.1 % nasal spray INSTILL 1 TO 2 SPRAYS INTO EACH NOSTRIL TWICE DAILY AS NEEDED 30 mL 12  ? denosumab (PROLIA) 60 MG/ML SOLN injection Inject 60 mg into the skin every 6 (six) months. Administer in upper arm, thigh, or abdomen    ? esomeprazole (NEXIUM) 40 MG capsule Take 40 mg by mouth as needed. Takes bedtime    ? senna (SENOKOT) 8.6 MG tablet Take 1 tablet by mouth as needed for constipation.    ? ?Current Facility-Administered Medications  ?Medication Dose Route Frequency Provider Last Rate Last Admin  ? 0.9 %  sodium chloride infusion  500 mL Intravenous  Once Thornton Park, MD      ? ? ?Allergies as of 02/12/2022 - Review Complete 02/12/2022  ?Allergen Reaction Noted  ? Ambien [zolpidem tartrate]  06/08/2014  ? Amoxicillin  06/25/2005  ? Bactrim [sulfamethoxazole-trimethoprim] Other (See Comments) 01/21/2022  ? Levaquin [levofloxacin] Other (See Comments) 01/12/2017  ? Lipitor [atorvastatin calcium]  01/09/2011  ? Oxycodone  01/20/2020  ? Penicillins    ? Prednisone Itching 12/20/2012  ? Latex Rash 12/13/2012  ? Mupirocin Rash 04/28/2016  ? Other Rash 04/28/2016  ? ? ?Family History  ?Problem Relation Age of Onset  ? Alzheimer's disease Mother   ? Sleep apnea Mother   ? Diabetes Mother   ? Hypertension Mother   ? Hyperlipidemia Mother   ? Breast cancer Mother 3  ?     triple negative  ? Anxiety disorder Daughter   ? Hypertension Son   ?     pulmonary  ? Congenital heart disease Son 0  ?     TGA  died age 55   ? Colon cancer Neg Hx   ? Colon polyps Neg Hx   ? Esophageal cancer Neg Hx   ? Rectal cancer Neg Hx   ? Stomach cancer Neg Hx   ? ? ? ?Physical Exam: ?General:   Alert,  well-nourished, pleasant and cooperative in NAD ?Head:  Normocephalic and atraumatic. ?Eyes:  Sclera clear, no icterus.   Conjunctiva pink. ?Mouth:  No deformity or lesions.   ?Neck:  Supple; no masses or thyromegaly. ?Lungs:  Clear throughout to auscultation.   No wheezes. ?Heart:  Regular rate and rhythm; no murmurs. ?Abdomen:  Soft, non-tender, nondistended, normal bowel sounds, no rebound or guarding.  ?Msk:  Symmetrical. No boney deformities ?LAD: No inguinal or umbilical LAD ?Extremities:  No clubbing or edema. ?Neurologic:  Alert and  oriented x4;  grossly nonfocal ?Skin:  No obvious rash or bruise. ?Psych:  Alert and cooperative. Normal mood and affect. ? ? ? ? ?Studies/Results: ?No results found. ? ? ? ?[name], MD, MPH ?02/12/2022, 9:28 AM ? ? ?  ?

## 2022-02-12 NOTE — Progress Notes (Signed)
Report given to PACU, vss 

## 2022-02-13 ENCOUNTER — Other Ambulatory Visit: Payer: Self-pay | Admitting: Family Medicine

## 2022-02-13 ENCOUNTER — Other Ambulatory Visit: Payer: Self-pay | Admitting: Internal Medicine

## 2022-02-13 DIAGNOSIS — G4733 Obstructive sleep apnea (adult) (pediatric): Secondary | ICD-10-CM | POA: Diagnosis not present

## 2022-02-13 DIAGNOSIS — R269 Unspecified abnormalities of gait and mobility: Secondary | ICD-10-CM | POA: Diagnosis not present

## 2022-02-13 NOTE — Telephone Encounter (Signed)
Please refill enough for 6 months

## 2022-02-13 NOTE — Telephone Encounter (Signed)
Last Vv 10/08/21 ?Filled 01/14/22 ?Is it ok to refill? ?

## 2022-02-14 ENCOUNTER — Telehealth: Payer: Self-pay

## 2022-02-14 ENCOUNTER — Encounter: Payer: Self-pay | Admitting: Gastroenterology

## 2022-02-14 NOTE — Telephone Encounter (Signed)
?  Follow up Call- ? ? ?  02/12/2022  ?  8:52 AM  ?Call back number  ?Post procedure Call Back phone  # [phone]  ?Permission to leave phone message Yes  ?  ?Post op call attempted, no answer, left WM.  ?

## 2022-02-14 NOTE — Telephone Encounter (Signed)
Left message on follow up call. 

## 2022-02-18 DIAGNOSIS — C50912 Malignant neoplasm of unspecified site of left female breast: Secondary | ICD-10-CM | POA: Diagnosis not present

## 2022-02-18 DIAGNOSIS — Z9011 Acquired absence of right breast and nipple: Secondary | ICD-10-CM | POA: Diagnosis not present

## 2022-02-19 ENCOUNTER — Ambulatory Visit (INDEPENDENT_AMBULATORY_CARE_PROVIDER_SITE_OTHER): Payer: Medicare Other | Admitting: Internal Medicine

## 2022-02-19 ENCOUNTER — Encounter: Payer: Self-pay | Admitting: Internal Medicine

## 2022-02-19 VITALS — BP 110/66 | HR 61 | Temp 99.0°F | Ht 63.0 in | Wt 179.2 lb

## 2022-02-19 DIAGNOSIS — E785 Hyperlipidemia, unspecified: Secondary | ICD-10-CM | POA: Diagnosis not present

## 2022-02-19 DIAGNOSIS — Z0000 Encounter for general adult medical examination without abnormal findings: Secondary | ICD-10-CM

## 2022-02-19 DIAGNOSIS — Z79899 Other long term (current) drug therapy: Secondary | ICD-10-CM

## 2022-02-19 DIAGNOSIS — E1165 Type 2 diabetes mellitus with hyperglycemia: Secondary | ICD-10-CM | POA: Diagnosis not present

## 2022-02-19 DIAGNOSIS — D693 Immune thrombocytopenic purpura: Secondary | ICD-10-CM | POA: Diagnosis not present

## 2022-02-19 DIAGNOSIS — G4733 Obstructive sleep apnea (adult) (pediatric): Secondary | ICD-10-CM | POA: Diagnosis not present

## 2022-02-19 DIAGNOSIS — I10 Essential (primary) hypertension: Secondary | ICD-10-CM | POA: Diagnosis not present

## 2022-02-19 DIAGNOSIS — Z853 Personal history of malignant neoplasm of breast: Secondary | ICD-10-CM | POA: Diagnosis not present

## 2022-02-19 LAB — LIPID PANEL
Cholesterol: 177 mg/dL (ref 0–200)
HDL: 69.1 mg/dL (ref >39.00)
LDL Cholesterol: 77 mg/dL (ref 0–99)
NonHDL: 108.02
Total CHOL/HDL Ratio: 3
Triglycerides: 155 mg/dL — ABNORMAL HIGH (ref 0.0–149.0)
VLDL: 31 mg/dL (ref 0.0–40.0)

## 2022-02-19 LAB — MICROALBUMIN / CREATININE URINE RATIO
Creatinine,U: 35.4 mg/dL
Microalb Creat Ratio: 2 mg/g (ref 0.0–30.0)
Microalb, Ur: <0.7 mg/dL (ref 0.0–1.9)

## 2022-02-19 LAB — TSH: TSH: 1.18 u[IU]/mL (ref 0.35–5.50)

## 2022-02-19 LAB — HEMOGLOBIN A1C: Hgb A1c MFr Bld: 7.2 % — ABNORMAL HIGH (ref 4.6–6.5)

## 2022-02-19 NOTE — Patient Instructions (Addendum)
Good to see  you . ?Updated labs    ?Last b12  was good in December.  ?No change in medication at this time . ? ? ? ? ? ?

## 2022-02-19 NOTE — Progress Notes (Signed)
? ?Chief Complaint  ?Patient presents with  ? Annual Exam  ? ? ?HPI: ?Patient  Chelsey [name]  76 y.o. comes in today for Preventive Health Care visit  and med check  ?Has had an eventful past year medically but is doing better this time ?ITP see last notes with severe thrombocytopenia diagnosed with ITP treated at [facility] platelet count is now normal being followed every 3 months at this point she was treated with at least 2 courses of steroids. ? ?Sertraline was stopped as felt that could contribute it to the thrombocytopenia and bleeding she has not resumed it she is more energy and tends to get anxious more quickly but no specific panic attack. ? ?Blood pressure controlled on losartan ?DM taking metformin no side effects reported has not had an A1c in quite a while although she has had many other labs done ? ?Trazodone is quite helpful for her sleep ?Alprazolam to use as needed. ?Rosuvastatin for HLD is due for a lipid panel ?She has stopped the Arimidex this year she is 12 years out and there is no data at this far out but she has done well otherwise.  And is chosen to try off.  With some reluctance.  Her recent scans have all been negative. ?She is taking Prolia up-to-date ?Had a recent eye check by Dr. Carolynn Sayers because of blurry vision no eye disease but does wear glasses. ?Has had a colonoscopy this year. ?Has been taking B12 recently when her B12 level was noted to be low at last evaluation last B12 level in the care everywhere system was 788 in December 22. ? ?Health Maintenance  ?Topic Date Due  ? Pneumonia Vaccine 75+ Years old (2 - PPSV23 if available, else PCV20) 07/22/2022 (Originally 11/24/2014)  ? COVID-19 Vaccine (6 - Booster for [place] series) 07/22/2022 (Originally 02/19/2021)  ? FOOT EXAM  08/22/2022 (Originally 01/19/2021)  ? TETANUS/TDAP  08/22/2022 (Originally 10/03/2020)  ? Zoster Vaccines- Shingrix (1 of 2) 08/22/2022 (Originally 04/16/1965)  ? Hepatitis C Screening  10/22/2022  (Originally 04/16/1964)  ? INFLUENZA VACCINE  05/13/2022  ? HEMOGLOBIN A1C  08/22/2022  ? OPHTHALMOLOGY EXAM  01/12/2023  ? MAMMOGRAM  01/12/2024  ? COLONOSCOPY (Pts 45-15yr Insurance coverage will need to be confirmed)  02/12/2029  ? DEXA SCAN  Completed  ? HPV VACCINES  Aged Out  ? 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 ?ROS:  ?REST of 12 system review negative except as per HPI gets foot pain top of foot bump if wears shoes over that area has seen orthopedist out some situations follow-up knee but not about her feet may have some residual neuropathy from treatments of breast cancer. ? ? ?Past Medical History:  ?Diagnosis Date  ? Allergy   ? Aphthous ulcer of mouth 2010  ? related to chemotherapy   ? Breast cancer ([facility] 2010  ? 2010 & 2011  ? Carpal tunnel syndrome of right wrist   ? Colitis   ? Colon polyp   ? Diabetes mellitus   ? Diverticulosis   ? GERD (gastroesophageal reflux disease)   ? Hyperlipidemia   ? Hypertension   ? Lymphedema 08/31/2013  ? left arm  ? Osteoarthritis   ? Pneumonia 07/28/2011  ? Sinusitis   ? Sleep apnea   ? CPAP-setting of 10  ? Squamous cell carcinoma   ? left leg  ? ? ?  Past Surgical History:  ?Procedure Laterality Date  ? AUGMENTATION MAMMAPLASTY Left   ? CESAREAN SECTION    ? x 3  ? COLONOSCOPY  12/02/2018  ? Dr.Beavers  ? MASTECTOMY Left 11/23  ? 2010  ? RECONSTRUCTION BREAST IMMEDIATE / DELAYED W/ TISSUE EXPANDER Left   ? REDUCTION MAMMAPLASTY Right   ? SKIN GRAFT    ? SQUAMOUS CELL CARCINOMA EXCISION Left   ? Left lower extremity  ? TONSILLECTOMY    ? as child  ? TOTAL KNEE ARTHROPLASTY Left 12/20/2012  ? Procedure: TOTAL KNEE ARTHROPLASTY;  Surgeon: Gearlean Alf, MD;  Location: [facility];  Service: Orthopedics;  Laterality: Left;  ? TOTAL KNEE ARTHROPLASTY Right 09/12/2013  ? Procedure: RIGHT TOTAL KNEE ARTHROPLASTY;  Surgeon: Gearlean Alf, MD;  Location: [facility];  Service:  Orthopedics;  Laterality: Right;  ? TUBAL LIGATION    ? ? ?Family History  ?Problem Relation Age of Onset  ? Alzheimer's disease Mother   ? Sleep apnea Mother   ? Diabetes Mother   ? Hypertension Mother   ? Hyperlipidemia Mother   ? Breast cancer Mother 27  ?     triple negative  ? Anxiety disorder Daughter   ? Hypertension Son   ?     pulmonary  ? Congenital heart disease Son 0  ?     TGA  died age 98   ? Colon cancer Neg Hx   ? Colon polyps Neg Hx   ? Esophageal cancer Neg Hx   ? Rectal cancer Neg Hx   ? Stomach cancer Neg Hx   ? ? ?Social History  ? ?Socioeconomic History  ? Marital status: Married  ?  Spouse name: Not on file  ? Number of children: 3  ? Years of education: Not on file  ? Highest education level: Not on file  ?Occupational History  ? Occupation: realtor  ?  Employer: [employer]  ? Occupation: Realtor  ?  Employer: [employer]  ?Tobacco Use  ? Smoking status: Never  ? Smokeless tobacco: Never  ?Vaping Use  ? Vaping Use: Never used  ?Substance and Sexual Activity  ? Alcohol use: Yes  ?  Alcohol/week: 7.0 - 14.0 standard drinks  ?  Types: 7 - 14 Glasses of wine per week  ?  Comment: 1-2 glasses every other night  ? Drug use: No  ? Sexual activity: Yes  ?  Birth control/protection: Post-menopausal, Surgical  ?Other Topics Concern  ? Not on file  ?Social History Narrative  ? cb x 3  ? Realtor married   ? Bereaved  Parent  ? 2 adult children  ? Mother with dementia and breast cancer  ? Passed away sept 70 15  Age 8  ? ?Social Determinants of Health  ? ?Financial Resource Strain: Not on file  ?Food Insecurity: Not on file  ?Transportation Needs: Not on file  ?Physical Activity: Not on file  ?Stress: Not on file  ?Social Connections: Not on file  ? ? ?Outpatient Medications Prior to Visit  ?Medication Sig Dispense Refill  ? ALPRAZolam (XANAX) 0.25 MG tablet TAKE 1 TABLET(0.25 MG) BY MOUTH TWICE DAILY AS NEEDED FOR ANXIETY. AVOID REGULAR USE 30 tablet 0  ? azelastine (ASTELIN) 0.1 % nasal spray  INSTILL 1 TO 2 SPRAYS INTO EACH NOSTRIL TWICE DAILY AS NEEDED 30 mL 12  ? cyanocobalamin 1000 MCG tablet Take 1 tablet by mouth daily.    ? denosumab (PROLIA) 60 MG/ML SOLN injection Inject  60 mg into the skin every 6 (six) months. Administer in upper arm, thigh, or abdomen    ? esomeprazole (NEXIUM) 40 MG capsule Take 40 mg by mouth as needed. Takes bedtime    ? losartan (COZAAR) 50 MG tablet TAKE 1 TABLET(50 MG) BY MOUTH DAILY 90 tablet 2  ? metFORMIN (GLUCOPHAGE-XR) 500 MG 24 hr tablet TAKE 2 TABLETS BY MOUTH TWICE DAILY WITH MEALS 360 tablet 1  ? rosuvastatin (CRESTOR) 10 MG tablet TAKE 1 TABLET BY MOUTH EVERY DAY 90 tablet 1  ? senna (SENOKOT) 8.6 MG tablet Take 1 tablet by mouth as needed for constipation.    ? traZODone (DESYREL) 50 MG tablet TAKE 1/2 TO 1 TABLET(25 TO 50 MG) BY MOUTH AT BEDTIME AS NEEDED FOR SLEEP 30 tablet 3  ? anastrozole (ARIMIDEX) 1 MG tablet Take 1 tablet (1 mg total) by mouth daily. (Patient not taking: Reported on 02/19/2022) 90 tablet 5  ? ?Facility-Administered Medications Prior to Visit  ?Medication Dose Route Frequency Provider Last Rate Last Admin  ? 0.9 %  sodium chloride infusion  500 mL Intravenous Once Thornton Park, MD      ? ? ? ?EXAM: ? ?BP 110/66 (BP Location: Right Arm, Patient Position: Sitting, Cuff Size: Normal)   Pulse 61   Temp 99 ?F (37.2 ?C) (Oral)   Ht '5\' 3"'$  (1.6 m)   Wt 179 lb 3.2 oz (81.3 kg)   SpO2 99%   BMI 31.74 kg/m?  ? ?Body mass index is 31.74 kg/m?. ?Wt Readings from Last 3 Encounters:  ?02/19/22 179 lb 3.2 oz (81.3 kg)  ?02/12/22 182 lb (82.6 kg)  ?01/21/22 182 lb (82.6 kg)  ? ? ?Physical Exam: ?Vital signs reviewed ?YQM:VHQI is a well-developed well-nourished alert cooperative    who appearsr stated age in no acute distress.  ?HEENT: normocephalic atraumatic , Eyes: PERRL EOM's full, conjunctiva clear, glasses nares: paten,t no deformity discharge or tenderness., Ears: no deformity EAC's clear TMs with normal landmarks. Mouth: clear  ?NECK:  supple without masses, thyromegaly or bruits. ?CHEST/PULM:  Clear to auscultation and percussion breath sounds equal no wheeze , rales or rhonchi. No chest wall deformities or tenderness. ? ?CV: PMI is nondisplaced, S1 S2 n

## 2022-02-20 NOTE — Progress Notes (Signed)
A1c is stable at  7.2  ?ldl is at goal  ? no  elevated protein in urine.   ?Plan 6 months check a1c at OV.  ? ?Also noted ,looks like you are due for the second pneumonia shot, pneumovax 23  as you had the prevnar 13 . Ask your hematology onc if ok to proceed with this injection  and can get here or at your pharmacy  any time

## 2022-02-27 DIAGNOSIS — G4733 Obstructive sleep apnea (adult) (pediatric): Secondary | ICD-10-CM | POA: Diagnosis not present

## 2022-03-16 DIAGNOSIS — R269 Unspecified abnormalities of gait and mobility: Secondary | ICD-10-CM | POA: Diagnosis not present

## 2022-03-16 DIAGNOSIS — G4733 Obstructive sleep apnea (adult) (pediatric): Secondary | ICD-10-CM | POA: Diagnosis not present

## 2022-03-24 ENCOUNTER — Ambulatory Visit: Payer: Medicare Other | Admitting: Internal Medicine

## 2022-04-02 DIAGNOSIS — C44529 Squamous cell carcinoma of skin of other part of trunk: Secondary | ICD-10-CM | POA: Diagnosis not present

## 2022-04-15 DIAGNOSIS — R269 Unspecified abnormalities of gait and mobility: Secondary | ICD-10-CM | POA: Diagnosis not present

## 2022-04-15 DIAGNOSIS — G4733 Obstructive sleep apnea (adult) (pediatric): Secondary | ICD-10-CM | POA: Diagnosis not present

## 2022-04-21 ENCOUNTER — Ambulatory Visit: Payer: Medicare Other | Admitting: Internal Medicine

## 2022-04-21 DIAGNOSIS — C44529 Squamous cell carcinoma of skin of other part of trunk: Secondary | ICD-10-CM | POA: Diagnosis not present

## 2022-04-29 DIAGNOSIS — Z87440 Personal history of urinary (tract) infections: Secondary | ICD-10-CM | POA: Diagnosis not present

## 2022-04-29 DIAGNOSIS — I890 Lymphedema, not elsewhere classified: Secondary | ICD-10-CM | POA: Diagnosis not present

## 2022-04-29 DIAGNOSIS — Z9221 Personal history of antineoplastic chemotherapy: Secondary | ICD-10-CM | POA: Diagnosis not present

## 2022-04-29 DIAGNOSIS — M899 Disorder of bone, unspecified: Secondary | ICD-10-CM | POA: Diagnosis not present

## 2022-04-29 DIAGNOSIS — D693 Immune thrombocytopenic purpura: Secondary | ICD-10-CM | POA: Diagnosis not present

## 2022-04-29 DIAGNOSIS — C50412 Malignant neoplasm of upper-outer quadrant of left female breast: Secondary | ICD-10-CM | POA: Diagnosis not present

## 2022-04-29 DIAGNOSIS — M8580 Other specified disorders of bone density and structure, unspecified site: Secondary | ICD-10-CM | POA: Diagnosis not present

## 2022-04-29 DIAGNOSIS — R918 Other nonspecific abnormal finding of lung field: Secondary | ICD-10-CM | POA: Diagnosis not present

## 2022-04-29 DIAGNOSIS — C50912 Malignant neoplasm of unspecified site of left female breast: Secondary | ICD-10-CM | POA: Diagnosis not present

## 2022-04-29 DIAGNOSIS — D696 Thrombocytopenia, unspecified: Secondary | ICD-10-CM | POA: Diagnosis not present

## 2022-04-29 DIAGNOSIS — Z8719 Personal history of other diseases of the digestive system: Secondary | ICD-10-CM | POA: Diagnosis not present

## 2022-04-29 DIAGNOSIS — Z170 Estrogen receptor positive status [ER+]: Secondary | ICD-10-CM | POA: Diagnosis not present

## 2022-04-29 DIAGNOSIS — M818 Other osteoporosis without current pathological fracture: Secondary | ICD-10-CM | POA: Diagnosis not present

## 2022-04-29 DIAGNOSIS — Z9012 Acquired absence of left breast and nipple: Secondary | ICD-10-CM | POA: Diagnosis not present

## 2022-04-29 DIAGNOSIS — Z9882 Breast implant status: Secondary | ICD-10-CM | POA: Diagnosis not present

## 2022-04-29 DIAGNOSIS — K5790 Diverticulosis of intestine, part unspecified, without perforation or abscess without bleeding: Secondary | ICD-10-CM | POA: Diagnosis not present

## 2022-04-29 DIAGNOSIS — Z79620 Long term (current) use of immunosuppressive biologic: Secondary | ICD-10-CM | POA: Diagnosis not present

## 2022-04-29 DIAGNOSIS — Z923 Personal history of irradiation: Secondary | ICD-10-CM | POA: Diagnosis not present

## 2022-05-04 ENCOUNTER — Encounter: Payer: Self-pay | Admitting: Internal Medicine

## 2022-05-05 DIAGNOSIS — L570 Actinic keratosis: Secondary | ICD-10-CM | POA: Diagnosis not present

## 2022-05-05 DIAGNOSIS — D1801 Hemangioma of skin and subcutaneous tissue: Secondary | ICD-10-CM | POA: Diagnosis not present

## 2022-05-05 DIAGNOSIS — L814 Other melanin hyperpigmentation: Secondary | ICD-10-CM | POA: Diagnosis not present

## 2022-05-05 DIAGNOSIS — Z419 Encounter for procedure for purposes other than remedying health state, unspecified: Secondary | ICD-10-CM | POA: Diagnosis not present

## 2022-05-05 DIAGNOSIS — Z85828 Personal history of other malignant neoplasm of skin: Secondary | ICD-10-CM | POA: Diagnosis not present

## 2022-05-05 DIAGNOSIS — L821 Other seborrheic keratosis: Secondary | ICD-10-CM | POA: Diagnosis not present

## 2022-05-05 DIAGNOSIS — L918 Other hypertrophic disorders of the skin: Secondary | ICD-10-CM | POA: Diagnosis not present

## 2022-05-05 NOTE — Progress Notes (Signed)
HPI female never smoker here with husband. History OSA/CPAP, complicated by history breast cancer, HBP, allergic rhinitis, ITP,  NPSG 05/06/19 AHI 47.7/hr,  CT report from [facility] 12/04/2020- Stable small nodules and radiation changes.  No cancer recurrence or mets. -----------------------------------------------------------------------------------------------------  12/30/21- 76 year old female never smoker followed for history OSA/CPAP, Breast Cancer L, HTN, allergicRrhinitis, ITP,   DM 2, GERD,  -Trazodone 50  CPAP auto 5-15/ [facility] Download- compliance 100%, AHI 1.5/ hr Body weight today- Covid vax-3 Phizer, 1 Moderna Flu vax-had After a viral respiratory syndrome she developed ITP and was hospitalized at [facility] days.  Still being followed by her oncologist with frequent blood work but platelet counts have come up. She continues annual CT ordered by her oncologist and will have results sent to [place] for history of lung nodule. Feels she cannot sleep/breathe at night without her CPAP but would like refitting of mask and asks referral.  Download reviewed showing good compliance and control. She has a mild dry cough that she and her husband refer to as "habit".  05/06/22- - 76 year old female never smoker followed for history OSA/CPAP, Lung Nodule, complicated by  Breast Cancer L, HTN, allergic Rhinitis, ITP, DM 2, GERD,  -Trazodone 50  CPAP auto 5-15/ [facility] Download- compliance 100%, AHI 2.2/ hr Body weight today-180 lbs Covid vax-3 Phizer, 1 Moderna -----Pt f/u OSA, CPAP working well 7-8hrs/night of rest. No other questions or concerns today Lung nodule stable- followed at [facility] by Oncology ITP resolved. Download reviewed. Sleeps better- husband confirms.  ROS-see HPI     + = positive Constitutional:   No-   weight loss, night sweats, + fevers, chills, fatigue, lassitude. HEENT:   + headaches, difficulty swallowing, tooth/dental problems, sore throat,       No-  sneezing,  +itching, ear ache, +nasal congestion, +post nasal drip,  CV:  No-   chest pain, orthopnea, PND, swelling in lower extremities, anasarca, dizziness, palpitations Resp: No-   shortness of breath with exertion or at rest.            productive cough,  No non-productive cough,  No- coughing up of blood.           change in color of mucus.  No- wheezing.   Skin: No-   rash or lesions. GI:  No-   heartburn, indigestion, abdominal pain, nausea, vomiting,  GU:  MS:  + joint pain or swelling.   Neuro-     nothing unusual Psych:  No- change in mood or affect. No depression or anxiety.  No memory loss.  OBJ- Physical Exam    General- Alert, Oriented, Affect-appropriate, Distress- none acute, + overweight Skin- rash-none, lesions- none, excoriation- none Lymphadenopathy- none Head- atraumatic            Eyes- Gross vision intact, PERRLA, conjunctivae and secretions clear,  + mild proptosis            Ears- Hearing, canals-normal            Nose- + prominent turbinates, Septal dev +mild, no-mucus, polyps, erosion, perforation             Throat- Mallampati III-IV, mucosa clear , drainage- none, tonsils- atrophic, own teeth Neck- flexible , trachea midline, no stridor , thyroid nl, carotid no bruit Chest - symmetrical excursion , unlabored           Heart/CV- RRR , no murmur , no gallop  , no rub, nl s1 s2                           -  JVD- none , edema- none, stasis changes- none, varices- none           Lung- clear, wheeze- none, cough-none,  mild dry throat clearing, dullness-none, rub- none           Chest wall-  Abd-  Br/ Gen/ Rectal- Not done, not indicated Extrem- cyanosis- none, clubbing, none, atrophy- none, strength- nl Neuro- grossly intact to observation

## 2022-05-06 ENCOUNTER — Encounter: Payer: Self-pay | Admitting: Internal Medicine

## 2022-05-06 ENCOUNTER — Ambulatory Visit: Payer: Medicare Other | Admitting: Internal Medicine

## 2022-05-06 DIAGNOSIS — R918 Other nonspecific abnormal finding of lung field: Secondary | ICD-10-CM

## 2022-05-06 DIAGNOSIS — G4733 Obstructive sleep apnea (adult) (pediatric): Secondary | ICD-10-CM

## 2022-05-06 NOTE — Patient Instructions (Signed)
We can continue CPAP auto 5-15 ° °Please call if we can help °

## 2022-05-16 DIAGNOSIS — R269 Unspecified abnormalities of gait and mobility: Secondary | ICD-10-CM | POA: Diagnosis not present

## 2022-05-16 DIAGNOSIS — G4733 Obstructive sleep apnea (adult) (pediatric): Secondary | ICD-10-CM | POA: Diagnosis not present

## 2022-05-28 DIAGNOSIS — G4733 Obstructive sleep apnea (adult) (pediatric): Secondary | ICD-10-CM | POA: Diagnosis not present

## 2022-06-06 ENCOUNTER — Other Ambulatory Visit: Payer: Self-pay | Admitting: *Deleted

## 2022-06-06 NOTE — Patient Outreach (Signed)
  Care Coordination   06/06/2022 Name: Chelsey [name] MRN: [id number] DOB: 06-Nov-1945   Care Coordination Outreach Attempts:  An unsuccessful telephone outreach was attempted today to offer the patient information about available care coordination services as a benefit of their health plan.   Follow Up Plan:  Additional outreach attempts will be made to offer the patient care coordination information and services.   Encounter Outcome:  No Answer  Care Coordination Interventions Activated:  No   Care Coordination Interventions:  No, not indicated    Raina Mina, RN Care Management Coordinator [place] Office [phone]

## 2022-06-10 ENCOUNTER — Encounter: Payer: Self-pay | Admitting: Internal Medicine

## 2022-06-14 ENCOUNTER — Other Ambulatory Visit: Payer: Self-pay | Admitting: Internal Medicine

## 2022-06-14 ENCOUNTER — Encounter: Payer: Self-pay | Admitting: Internal Medicine

## 2022-06-14 NOTE — Assessment & Plan Note (Signed)
She reports stable on f/u at [facility]

## 2022-06-14 NOTE — Assessment & Plan Note (Signed)
Benefits from CPAP Plan- continue auto 5-15 

## 2022-06-16 DIAGNOSIS — R269 Unspecified abnormalities of gait and mobility: Secondary | ICD-10-CM | POA: Diagnosis not present

## 2022-06-16 DIAGNOSIS — G4733 Obstructive sleep apnea (adult) (pediatric): Secondary | ICD-10-CM | POA: Diagnosis not present

## 2022-06-17 MED ORDER — SERTRALINE HCL 25 MG PO TABS
25.0000 mg | ORAL_TABLET | Freq: Every day | ORAL | 1 refills | Status: DC
Start: 2022-06-17 — End: 2022-08-20

## 2022-06-17 NOTE — Telephone Encounter (Signed)
Notified patient of rx and to let us know how doing in a few weeks.  ( She has been on this med before.

## 2022-06-18 DIAGNOSIS — Z85828 Personal history of other malignant neoplasm of skin: Secondary | ICD-10-CM | POA: Diagnosis not present

## 2022-06-18 DIAGNOSIS — L910 Hypertrophic scar: Secondary | ICD-10-CM | POA: Diagnosis not present

## 2022-06-25 ENCOUNTER — Ambulatory Visit: Payer: Self-pay

## 2022-06-25 NOTE — Patient Outreach (Signed)
  Care Coordination   Initial Visit Note   06/25/2022 Name: Chelsey [name] MRN: [id number] DOB: [birth year]  Chelsey [name] is a 76 y.o. year old female who sees Panosh, Standley Brooking, MD for primary care. I spoke with  Chelsey [name] by phone today.  What matters to the patients health and wellness today?  Patient declined to participate in call.    Goals Addressed   None     SDOH assessments and interventions completed:  No     Care Coordination Interventions Activated:  No  Care Coordination Interventions:  No, not indicated   Follow up plan: No further intervention required.   Encounter Outcome:  Pt. Refused   Daneen Schick, BSW, CDP Social Worker, Certified Dementia Practitioner [place] [facility]  Care Coordination [phone]

## 2022-07-16 DIAGNOSIS — G4733 Obstructive sleep apnea (adult) (pediatric): Secondary | ICD-10-CM | POA: Diagnosis not present

## 2022-07-16 DIAGNOSIS — R269 Unspecified abnormalities of gait and mobility: Secondary | ICD-10-CM | POA: Diagnosis not present

## 2022-07-29 DIAGNOSIS — G4733 Obstructive sleep apnea (adult) (pediatric): Secondary | ICD-10-CM | POA: Diagnosis not present

## 2022-08-05 DIAGNOSIS — R918 Other nonspecific abnormal finding of lung field: Secondary | ICD-10-CM | POA: Diagnosis not present

## 2022-08-05 DIAGNOSIS — I890 Lymphedema, not elsewhere classified: Secondary | ICD-10-CM | POA: Diagnosis not present

## 2022-08-05 DIAGNOSIS — C50912 Malignant neoplasm of unspecified site of left female breast: Secondary | ICD-10-CM | POA: Diagnosis not present

## 2022-08-05 DIAGNOSIS — C50812 Malignant neoplasm of overlapping sites of left female breast: Secondary | ICD-10-CM | POA: Diagnosis not present

## 2022-08-05 DIAGNOSIS — D693 Immune thrombocytopenic purpura: Secondary | ICD-10-CM | POA: Diagnosis not present

## 2022-08-05 DIAGNOSIS — Z79899 Other long term (current) drug therapy: Secondary | ICD-10-CM | POA: Diagnosis not present

## 2022-08-05 DIAGNOSIS — Z862 Personal history of diseases of the blood and blood-forming organs and certain disorders involving the immune mechanism: Secondary | ICD-10-CM | POA: Diagnosis not present

## 2022-08-05 DIAGNOSIS — Z923 Personal history of irradiation: Secondary | ICD-10-CM | POA: Diagnosis not present

## 2022-08-05 DIAGNOSIS — Z170 Estrogen receptor positive status [ER+]: Secondary | ICD-10-CM | POA: Diagnosis not present

## 2022-08-05 DIAGNOSIS — M818 Other osteoporosis without current pathological fracture: Secondary | ICD-10-CM | POA: Diagnosis not present

## 2022-08-13 ENCOUNTER — Other Ambulatory Visit: Payer: Self-pay | Admitting: Internal Medicine

## 2022-08-16 DIAGNOSIS — G4733 Obstructive sleep apnea (adult) (pediatric): Secondary | ICD-10-CM | POA: Diagnosis not present

## 2022-08-16 DIAGNOSIS — R269 Unspecified abnormalities of gait and mobility: Secondary | ICD-10-CM | POA: Diagnosis not present

## 2022-08-18 ENCOUNTER — Telehealth: Payer: Self-pay

## 2022-08-18 DIAGNOSIS — Z780 Asymptomatic menopausal state: Secondary | ICD-10-CM | POA: Diagnosis not present

## 2022-08-18 NOTE — Telephone Encounter (Signed)
From [facility]: documented statin intolerance due to leg cramps next PCP appointment 08/20/2022; Please consider associating exclusion code G72.0 (myopathy) at the next office visit  Will send note for provider for 08/20/22

## 2022-08-19 ENCOUNTER — Encounter: Payer: Self-pay | Admitting: Pharmacist

## 2022-08-19 DIAGNOSIS — T466X5A Adverse effect of antihyperlipidemic and antiarteriosclerotic drugs, initial encounter: Secondary | ICD-10-CM

## 2022-08-19 DIAGNOSIS — L821 Other seborrheic keratosis: Secondary | ICD-10-CM | POA: Diagnosis not present

## 2022-08-19 DIAGNOSIS — Z85828 Personal history of other malignant neoplasm of skin: Secondary | ICD-10-CM | POA: Diagnosis not present

## 2022-08-19 DIAGNOSIS — L820 Inflamed seborrheic keratosis: Secondary | ICD-10-CM | POA: Diagnosis not present

## 2022-08-19 DIAGNOSIS — L570 Actinic keratosis: Secondary | ICD-10-CM | POA: Diagnosis not present

## 2022-08-19 NOTE — Progress Notes (Signed)
[facility])                                            [place] Team                                        Statin Quality Measure Assessment    08/19/2022  Chelsey [name] 1946-07-24 [id number]     Per review of chart and payor information, patient has a diagnosis of diabetes but is not currently filling a statin prescription.  This places patient into the SUPD (Statin Use In Patients with Diabetes) measure for CMS.    Patient has documented trials of statins with reported myopathy, but no corresponding CPT codes that would exclude patient from SUPD measure.  She has an upcoming appointment 08/20/22.  If deemed therapeutically appropriate, a statin exclusion code could be associated with the visit an alternative medications investigated.  The 10-year ASCVD risk score (Arnett DK, et al., 2019) is: 41.1%   Values used to calculate the score:     Age: 76 years     Sex: Female     Is Non-Hispanic African American: No     Diabetic: Yes     Tobacco smoker: No     Systolic Blood Pressure: 073 mmHg     Is BP treated: Yes     HDL Cholesterol: 69.1 mg/dL     Total Cholesterol: 177 mg/dL 02/19/2022     Component Value Date/Time   CHOL 177 02/19/2022 1155   TRIG 155.0 (H) 02/19/2022 1155   TRIG 71 09/15/2006 1055   HDL 69.10 02/19/2022 1155   CHOLHDL 3 02/19/2022 1155   VLDL 31.0 02/19/2022 1155   LDLCALC 77 02/19/2022 1155   LDLDIRECT 173.9 11/09/2013 0915    Please consider ONE of the following recommendations:  Initiate high intensity statin Atorvastatin '40mg'$  once daily, #90, 3 refills   Rosuvastatin '20mg'$  once daily, #90, 3 refills    Initiate moderate intensity          statin with reduced frequency if prior          statin intolerance 1x weekly, #13, 3 refills   2x weekly, #26, 3 refills   3x weekly, #39, 3 refills    Code for past statin intolerance or  other exclusions (required annually)   Provider  Requirements:  Associate code during an office visit or telehealth encounter  Drug Induced Myopathy G72.0   Myopathy, unspecified G72.9   Myositis, unspecified M60.9   Rhabdomyolysis X10.62   Alcoholic fatty liver I94.8   Cirrhosis of liver K74.69   Prediabetes R73.03   PCOS E28.2   Toxic liver disease, unspecified K71.9        Plan: Route note to provider prior to upcoming appointment.  Elayne Guerin, PharmD, [place] Clinical Pharmacist [phone]

## 2022-08-20 ENCOUNTER — Ambulatory Visit (INDEPENDENT_AMBULATORY_CARE_PROVIDER_SITE_OTHER): Payer: Medicare Other | Admitting: Internal Medicine

## 2022-08-20 ENCOUNTER — Encounter: Payer: Self-pay | Admitting: Internal Medicine

## 2022-08-20 VITALS — BP 120/76 | HR 62 | Temp 98.5°F | Wt 178.6 lb

## 2022-08-20 DIAGNOSIS — I10 Essential (primary) hypertension: Secondary | ICD-10-CM | POA: Diagnosis not present

## 2022-08-20 DIAGNOSIS — E785 Hyperlipidemia, unspecified: Secondary | ICD-10-CM

## 2022-08-20 DIAGNOSIS — Z853 Personal history of malignant neoplasm of breast: Secondary | ICD-10-CM

## 2022-08-20 DIAGNOSIS — F4322 Adjustment disorder with anxiety: Secondary | ICD-10-CM | POA: Diagnosis not present

## 2022-08-20 DIAGNOSIS — E1165 Type 2 diabetes mellitus with hyperglycemia: Secondary | ICD-10-CM | POA: Diagnosis not present

## 2022-08-20 DIAGNOSIS — Z23 Encounter for immunization: Secondary | ICD-10-CM

## 2022-08-20 DIAGNOSIS — Z79899 Other long term (current) drug therapy: Secondary | ICD-10-CM | POA: Diagnosis not present

## 2022-08-20 DIAGNOSIS — D693 Immune thrombocytopenic purpura: Secondary | ICD-10-CM

## 2022-08-20 LAB — POCT GLYCOSYLATED HEMOGLOBIN (HGB A1C): Hemoglobin A1C: 6.6 % — AB (ref 4.0–5.6)

## 2022-08-20 MED ORDER — SERTRALINE HCL 50 MG PO TABS: 75.0000 mg | ORAL_TABLET | Freq: Every day | ORAL | 1 refills | Status: DC

## 2022-08-20 NOTE — Patient Instructions (Signed)
Good to see you today .  Bp is good .  Ok to check future cbc and platelet . Get appt when needed.  A1c is 6.6

## 2022-08-20 NOTE — Progress Notes (Signed)
Chief Complaint  Patient presents with   Follow-up    HPI: Chelsey [name] 76 y.o. come in for Chronic disease management  DM metformin  doing ok BP losartan in range  Breast cancer new oncologist as prev retired  on going care   just over 10 years  Anxiety mood meds zokift  and alprazolam  and trazodone  now up to 50 mg   for anxiety.  Just gound out that daughters fiance   dx with bilateral renal cancer  consdier increase dose  Hx severe ITP  new oncologist .    Last platelets.  .    200k+   advised  pcp monitor  plts for now  no bleeding or petechia  Bone health on prolia .   ROS: See pertinent positives and negatives per HPI. No new cv pulm sx   Past Medical History:  Diagnosis Date   Allergy    Aphthous ulcer of mouth 2010   related to chemotherapy    Breast cancer [facility]) 2010   2010 & 2011   Carpal tunnel syndrome of right wrist    Colitis    Colon polyp    Diabetes mellitus    Diverticulosis    GERD (gastroesophageal reflux disease)    Hyperlipidemia    Hypertension    Lymphedema 08/31/2013   left arm   Osteoarthritis    Pneumonia 07/28/2011   Sinusitis    Sleep apnea    CPAP-setting of 10   Squamous cell carcinoma    left leg    Family History  Problem Relation Age of Onset   Alzheimer's disease Mother    Sleep apnea Mother    Diabetes Mother    Hypertension Mother    Hyperlipidemia Mother    Breast cancer Mother 84       triple negative   Anxiety disorder Daughter    Hypertension Son        pulmonary   Congenital heart disease Son 0       TGA  died age 69    Colon cancer Neg Hx    Colon polyps Neg Hx    Esophageal cancer Neg Hx    Rectal cancer Neg Hx    Stomach cancer Neg Hx     Social History   Socioeconomic History   Marital status: Married    Spouse name: Not on file   Number of children: 3   Years of education: Not on file   Highest education level: Not on file  Occupational History   Occupation: Architectural technologist: [employer]    Occupation: Architectural technologist: [employer]  Tobacco Use   Smoking status: Never   Smokeless tobacco: Never  Vaping Use   Vaping Use: Never used  Substance and Sexual Activity   Alcohol use: Yes    Alcohol/week: 7.0 - 14.0 standard drinks of alcohol    Types: 7 - 14 Glasses of wine per week    Comment: 1-2 glasses every other night   Drug use: No   Sexual activity: Yes    Birth control/protection: Post-menopausal, Surgical  Other Topics Concern   Not on file  Social History Narrative   cb x 3   Realtor married    Bereaved  Parent   2 adult children   Mother with dementia and breast cancer   Passed away sept 56 15  Age 16   Social Determinants of Radio broadcast assistant Strain:  Not on file  Food Insecurity: Not on file  Transportation Needs: Not on file  Physical Activity: Not on file  Stress: Not on file  Social Connections: Not on file    Outpatient Medications Prior to Visit  Medication Sig Dispense Refill   ALPRAZolam (XANAX) 0.25 MG tablet TAKE 1 TABLET(0.25 MG) BY MOUTH TWICE DAILY AS NEEDED FOR ANXIETY. AVOID REGULAR USE 30 tablet 0   azelastine (ASTELIN) 0.1 % nasal spray INSTILL 1 TO 2 SPRAYS INTO EACH NOSTRIL TWICE DAILY AS NEEDED 30 mL 12   cyanocobalamin 1000 MCG tablet Take 1 tablet by mouth daily.     denosumab (PROLIA) 60 MG/ML SOLN injection Inject 60 mg into the skin every 6 (six) months. Administer in upper arm, thigh, or abdomen     esomeprazole (NEXIUM) 40 MG capsule Take 40 mg by mouth as needed. Takes bedtime     losartan (COZAAR) 50 MG tablet TAKE 1 TABLET(50 MG) BY MOUTH DAILY 90 tablet 2   metFORMIN (GLUCOPHAGE-XR) 500 MG 24 hr tablet TAKE 2 TABLETS BY MOUTH TWICE DAILY WITH MEALS 360 tablet 1   rosuvastatin (CRESTOR) 10 MG tablet TAKE 1 TABLET BY MOUTH EVERY DAY 90 tablet 1   senna (SENOKOT) 8.6 MG tablet Take 1 tablet by mouth as needed for constipation.     traZODone (DESYREL) 50 MG tablet TAKE 1/2 TO 1 TABLET(25 TO 50 MG) BY MOUTH AT  BEDTIME AS NEEDED FOR SLEEP 30 tablet 3   sertraline (ZOLOFT) 25 MG tablet Take 1 tablet (25 mg total) by mouth daily. May increase to 50 mg per day after 3 weeks 90 tablet 1   Facility-Administered Medications Prior to Visit  Medication Dose Route Frequency Provider Last Rate Last Admin   0.9 %  sodium chloride infusion  500 mL Intravenous Once Thornton Park, MD         EXAM:  BP 120/76 (BP Location: Right Arm, Patient Position: Sitting, Cuff Size: Normal)   Pulse 62   Temp 98.5 F (36.9 C) (Oral)   Wt 178 lb 9.6 oz (81 kg)   SpO2 98%   BMI 31.14 kg/m   Body mass index is 31.14 kg/m. Wt Readings from Last 3 Encounters:  08/20/22 178 lb 9.6 oz (81 kg)  05/06/22 180 lb 9.6 oz (81.9 kg)  02/19/22 179 lb 3.2 oz (81.3 kg)    GENERAL: vitals reviewed and listed above, alert, oriented, appears well hydrated and in no acute distress HEENT: atraumatic, conjunctiva  clear, no obvious abnormalities on inspection of external nose and ears  NECK: no obvious masses on inspection palpation  LMS: moves all extremities without noticeable focal  abnormality PSYCH: pleasant and cooperative, no obvious depression or anxiety Lab Results  Component Value Date   WBC 5.1 06/03/2021   HGB 12.8 06/03/2021   HCT 37.0 06/03/2021   PLT 3.0 (LL) 06/03/2021   GLUCOSE 141 (H) 06/03/2021   CHOL 177 02/19/2022   TRIG 155.0 (H) 02/19/2022   HDL 69.10 02/19/2022   LDLDIRECT 173.9 11/09/2013   LDLCALC 77 02/19/2022   ALT 13 06/03/2021   AST 16 06/03/2021   NA 139 06/03/2021   K 3.9 06/03/2021   CL 104 06/03/2021   CREATININE 0.77 06/03/2021   BUN 11 06/03/2021   CO2 24 06/03/2021   TSH 1.18 02/19/2022   INR 1.0 06/03/2021   HGBA1C 6.6 (A) 08/20/2022   MICROALBUR <0.7 02/19/2022   BP Readings from Last 3 Encounters:  08/20/22 120/76  05/06/22 112/70  02/19/22 110/66   Cmp cr 0.74 lfts nll for [facility]  Last vit d was 69  ASSESSMENT AND PLAN:  Discussed the following assessment  and plan:  Type 2 diabetes mellitus with hyperglycemia, without long-term current use of insulin (HCC) - Plan: POC HgB A1c  Adjustment disorder with anxious mood - in c sertraline 75  fu if need to inc dose  external factors  Idiopathic thrombocytopenic purpura (ITP) (HCC) - hx of same  ok to monitor plt as indicated - Plan: CBC with Differential/Platelet  Essential hypertension  Medication management  Hyperlipidemia, unspecified hyperlipidemia type - at goal  HX: breast cancer Disease states are stable at this time  continue   Monitor cbcdiff plt  -Patient advised to return or notify health care team  if  new concerns arise.  Patient Instructions  Good to see you today .  Bp is good .  Ok to check future cbc and platelet . Get appt when needed.  A1c is 6.6      Iktan Aikman K. Deveon Kisiel M.D.

## 2022-09-03 NOTE — Addendum Note (Signed)
Addended by: Encarnacion Slates on: 09/03/2022 08:36 AM   Modules accepted: Orders

## 2022-09-15 DIAGNOSIS — G4733 Obstructive sleep apnea (adult) (pediatric): Secondary | ICD-10-CM | POA: Diagnosis not present

## 2022-09-15 DIAGNOSIS — R269 Unspecified abnormalities of gait and mobility: Secondary | ICD-10-CM | POA: Diagnosis not present

## 2022-10-12 ENCOUNTER — Other Ambulatory Visit: Payer: Self-pay | Admitting: Internal Medicine

## 2022-10-14 NOTE — Telephone Encounter (Signed)
Last OV-08/20/22 Last refill-06/17/22-30 tabs, 3 refill  No future OV scheduled

## 2022-10-16 DIAGNOSIS — R269 Unspecified abnormalities of gait and mobility: Secondary | ICD-10-CM | POA: Diagnosis not present

## 2022-10-16 DIAGNOSIS — G4733 Obstructive sleep apnea (adult) (pediatric): Secondary | ICD-10-CM | POA: Diagnosis not present

## 2022-10-23 DIAGNOSIS — G4733 Obstructive sleep apnea (adult) (pediatric): Secondary | ICD-10-CM | POA: Diagnosis not present

## 2022-10-28 ENCOUNTER — Other Ambulatory Visit: Payer: Self-pay | Admitting: Internal Medicine

## 2022-11-18 DIAGNOSIS — G4733 Obstructive sleep apnea (adult) (pediatric): Secondary | ICD-10-CM | POA: Diagnosis not present

## 2022-11-27 DIAGNOSIS — Z9889 Other specified postprocedural states: Secondary | ICD-10-CM | POA: Diagnosis not present

## 2022-12-09 ENCOUNTER — Other Ambulatory Visit (INDEPENDENT_AMBULATORY_CARE_PROVIDER_SITE_OTHER): Payer: Medicare Other

## 2022-12-09 DIAGNOSIS — D693 Immune thrombocytopenic purpura: Secondary | ICD-10-CM

## 2022-12-09 LAB — CBC WITH DIFFERENTIAL/PLATELET
Basophils Absolute: 0 10*3/uL (ref 0.0–0.1)
Basophils Relative: 0.8 % (ref 0.0–3.0)
Eosinophils Absolute: 0.1 10*3/uL (ref 0.0–0.7)
Eosinophils Relative: 1.8 % (ref 0.0–5.0)
HCT: 40.4 % (ref 36.0–46.0)
Hemoglobin: 13.7 g/dL (ref 12.0–15.0)
Lymphocytes Relative: 35.5 % (ref 12.0–46.0)
Lymphs Abs: 2.1 10*3/uL (ref 0.7–4.0)
MCHC: 33.9 g/dL (ref 30.0–36.0)
MCV: 90.8 fl (ref 78.0–100.0)
Monocytes Absolute: 0.5 10*3/uL (ref 0.1–1.0)
Monocytes Relative: 8.7 % (ref 3.0–12.0)
Neutro Abs: 3.2 10*3/uL (ref 1.4–7.7)
Neutrophils Relative %: 53.2 % (ref 43.0–77.0)
Platelets: 183 10*3/uL (ref 150.0–400.0)
RBC: 4.45 Mil/uL (ref 3.87–5.11)
RDW: 13.8 % (ref 11.5–15.5)
WBC: 6 10*3/uL (ref 4.0–10.5)

## 2022-12-11 ENCOUNTER — Telehealth: Payer: Self-pay | Admitting: Internal Medicine

## 2022-12-11 NOTE — Telephone Encounter (Signed)
Pt is returning karpuih call concerning blood work results

## 2022-12-11 NOTE — Telephone Encounter (Signed)
Spoke to pt. Pt is aware of her lab result. Pt request to speak with Dr. Regis Bill regarding to [place]. VV appt is scheduled.

## 2022-12-22 DIAGNOSIS — Z961 Presence of intraocular lens: Secondary | ICD-10-CM | POA: Diagnosis not present

## 2022-12-22 DIAGNOSIS — H04123 Dry eye syndrome of bilateral lacrimal glands: Secondary | ICD-10-CM | POA: Diagnosis not present

## 2022-12-22 DIAGNOSIS — H40013 Open angle with borderline findings, low risk, bilateral: Secondary | ICD-10-CM | POA: Diagnosis not present

## 2022-12-22 DIAGNOSIS — H10413 Chronic giant papillary conjunctivitis, bilateral: Secondary | ICD-10-CM | POA: Diagnosis not present

## 2022-12-22 LAB — HM DIABETES EYE EXAM

## 2022-12-24 ENCOUNTER — Telehealth (INDEPENDENT_AMBULATORY_CARE_PROVIDER_SITE_OTHER): Payer: Medicare Other | Admitting: Internal Medicine

## 2022-12-24 ENCOUNTER — Encounter: Payer: Self-pay | Admitting: Internal Medicine

## 2022-12-24 VITALS — Ht 63.5 in | Wt 175.0 lb

## 2022-12-24 DIAGNOSIS — Z862 Personal history of diseases of the blood and blood-forming organs and certain disorders involving the immune mechanism: Secondary | ICD-10-CM | POA: Diagnosis not present

## 2022-12-24 DIAGNOSIS — I10 Essential (primary) hypertension: Secondary | ICD-10-CM | POA: Diagnosis not present

## 2022-12-24 DIAGNOSIS — Z79899 Other long term (current) drug therapy: Secondary | ICD-10-CM

## 2022-12-24 DIAGNOSIS — Z853 Personal history of malignant neoplasm of breast: Secondary | ICD-10-CM | POA: Diagnosis not present

## 2022-12-24 DIAGNOSIS — E1165 Type 2 diabetes mellitus with hyperglycemia: Secondary | ICD-10-CM

## 2022-12-24 DIAGNOSIS — Z6830 Body mass index (BMI) 30.0-30.9, adult: Secondary | ICD-10-CM

## 2022-12-24 MED ORDER — TIRZEPATIDE 2.5 MG/0.5ML ~~LOC~~ SOAJ: 2.5000 mg | SUBCUTANEOUS | 0 refills | Status: DC

## 2022-12-24 NOTE — Progress Notes (Signed)
Virtual Visit via Video Note  I connected with Chelsey [name] on 12/24/22 at  4:00 PM EDT by a video enabled telemedicine application and verified that I am speaking with the correct person using two identifiers. Location patient: home Location provider:work office Persons participating in the virtual visit: patient, provider  Patient aware  of the limitations of evaluation and management by telemedicine and  availability of in person appointments. and agreed to proceed.   HPI: Chelsey [name] presents for video visit interested is  other meds for dm  not checking sugars but still on metformin  may also help central obesity .  Had se with farxiga see notes . Platelets are doing ok normal  No new neuro sx  bleeding  gi sx  ROS: See pertinent positives and negatives per HPI.  Past Medical History:  Diagnosis Date   Allergy    Aphthous ulcer of mouth 2010   related to chemotherapy    Breast cancer [facility]) 2010   2010 & 2011   Carpal tunnel syndrome of right wrist    Colitis    Colon polyp    Diabetes mellitus    Diverticulosis    GERD (gastroesophageal reflux disease)    Hyperlipidemia    Hypertension    Lymphedema 08/31/2013   left arm   Osteoarthritis    Pneumonia 07/28/2011   Sinusitis    Sleep apnea    CPAP-setting of 10   Squamous cell carcinoma    left leg    Past Surgical History:  Procedure Laterality Date   AUGMENTATION MAMMAPLASTY Left    CESAREAN SECTION     x 3   COLONOSCOPY  12/02/2018   Dr.Beavers   MASTECTOMY Left 11/23   2010   RECONSTRUCTION BREAST IMMEDIATE / DELAYED W/ TISSUE EXPANDER Left    REDUCTION MAMMAPLASTY Right    SKIN GRAFT     SQUAMOUS CELL CARCINOMA EXCISION Left    Left lower extremity   TONSILLECTOMY     as child   TOTAL KNEE ARTHROPLASTY Left 12/20/2012   Procedure: TOTAL KNEE ARTHROPLASTY;  Surgeon: Gearlean Alf, MD;  Location: [facility];  Service: Orthopedics;  Laterality: Left;   TOTAL KNEE ARTHROPLASTY Right 09/12/2013    Procedure: RIGHT TOTAL KNEE ARTHROPLASTY;  Surgeon: Gearlean Alf, MD;  Location: [facility];  Service: Orthopedics;  Laterality: Right;   TUBAL LIGATION      Family History  Problem Relation Age of Onset   Alzheimer's disease Mother    Sleep apnea Mother    Diabetes Mother    Hypertension Mother    Hyperlipidemia Mother    Breast cancer Mother 20       triple negative   Anxiety disorder Daughter    Hypertension Son        pulmonary   Congenital heart disease Son 0       TGA  died age 32    Colon cancer Neg Hx    Colon polyps Neg Hx    Esophageal cancer Neg Hx    Rectal cancer Neg Hx    Stomach cancer Neg Hx     Social History   Tobacco Use   Smoking status: Never   Smokeless tobacco: Never  Vaping Use   Vaping Use: Never used  Substance Use Topics   Alcohol use: Yes    Alcohol/week: 7.0 - 14.0 standard drinks of alcohol    Types: 7 - 14 Glasses of wine per week    Comment: 1-2  glasses every other night   Drug use: No      Current Outpatient Medications:    ALPRAZolam (XANAX) 0.25 MG tablet, TAKE 1 TABLET(0.25 MG) BY MOUTH TWICE DAILY AS NEEDED FOR ANXIETY. AVOID REGULAR USE, Disp: 30 tablet, Rfl: 0   azelastine (ASTELIN) 0.1 % nasal spray, INSTILL 1 TO 2 SPRAYS INTO EACH NOSTRIL TWICE DAILY AS NEEDED, Disp: 30 mL, Rfl: 12   calcium citrate (CALCITRATE - DOSED IN MG ELEMENTAL CALCIUM) 950 (200 Ca) MG tablet, Take 1 tablet by mouth. Every other day, Disp: , Rfl:    cyanocobalamin 1000 MCG tablet, Take 1 tablet by mouth daily., Disp: , Rfl:    denosumab (PROLIA) 60 MG/ML SOLN injection, Inject 60 mg into the skin every 6 (six) months. Administer in upper arm, thigh, or abdomen, Disp: , Rfl:    esomeprazole (NEXIUM) 40 MG capsule, Take 40 mg by mouth as needed. Takes bedtime, Disp: , Rfl:    losartan (COZAAR) 50 MG tablet, TAKE 1 TABLET(50 MG) BY MOUTH DAILY, Disp: 90 tablet, Rfl: 2   metFORMIN (GLUCOPHAGE-XR) 500 MG 24 hr tablet, TAKE 2 TABLETS BY MOUTH TWICE DAILY WITH  MEALS, Disp: 360 tablet, Rfl: 1   rosuvastatin (CRESTOR) 10 MG tablet, TAKE 1 TABLET BY MOUTH EVERY DAY, Disp: 90 tablet, Rfl: 0   senna (SENOKOT) 8.6 MG tablet, Take 1 tablet by mouth as needed for constipation., Disp: , Rfl:    sertraline (ZOLOFT) 50 MG tablet, Take 1.5 tablets (75 mg total) by mouth daily., Disp: 135 tablet, Rfl: 1   tirzepatide (MOUNJARO) 2.5 MG/0.5ML Pen, Inject 2.5 mg into the skin once a week., Disp: 2 mL, Rfl: 0   traZODone (DESYREL) 50 MG tablet, TAKE 1/2 TO 1 TABLET(25 TO 50 MG) BY MOUTH AT BEDTIME AS NEEDED FOR SLEEP, Disp: 30 tablet, Rfl: 3  Current Facility-Administered Medications:    0.9 %  sodium chloride infusion, 500 mL, Intravenous, Once, [place], MD  EXAM: BP Readings from Last 3 Encounters:  08/20/22 120/76  05/06/22 112/70  02/19/22 110/66    VITALS per patient if applicable:  GENERAL: alert, oriented, appears well and in no acute distress  HEENT: atraumatic, conjunttiva clear, no obvious abnormalities on inspection of external nose and ears  NECK: normal movements of the head and neck  LUNGS: on inspection no signs of respiratory distress, breathing rate appears normal, no obvious gross SOB, gasping or wheezing  CV: no obvious cyanosis  MS: moves all visible extremities without noticeable abnormality  PSYCH/NEURO: pleasant and cooperative, no obvious depression or anxiety, speech and thought processing grossly intact Lab Results  Component Value Date   WBC 6.0 12/09/2022   HGB 13.7 12/09/2022   HCT 40.4 12/09/2022   PLT 183.0 12/09/2022   GLUCOSE 141 (H) 06/03/2021   CHOL 177 02/19/2022   TRIG 155.0 (H) 02/19/2022   HDL 69.10 02/19/2022   LDLDIRECT 173.9 11/09/2013   LDLCALC 77 02/19/2022   ALT 13 06/03/2021   AST 16 06/03/2021   NA 139 06/03/2021   K 3.9 06/03/2021   CL 104 06/03/2021   CREATININE 0.77 06/03/2021   BUN 11 06/03/2021   CO2 24 06/03/2021   TSH 1.18 02/19/2022   INR 1.0 06/03/2021   HGBA1C 6.6 (A)  08/20/2022   MICROALBUR <0.7 02/19/2022    ASSESSMENT AND PLAN:  Discussed the following assessment and plan:    ICD-10-CM   1. Type 2 diabetes mellitus with hyperglycemia, without long-term current use of insulin (HCC)  E11.65  begin mounjaro    2. Medication management  Z79.899     3. Essential hypertension  I10     4. HX: breast cancer  Z85.3     5. BMI 30.0-30.9,adult  Z68.30     6. History of ITP  Z86.2      Good candidate for mounjaro  ;cont metformin   if pa not approved can try semiglutide  Had bad yeast infection with farxiga and then had severe ITp soon after so  avoiding this drug group.  Counseled.   Expectant management and discussion of plan and treatment with opportunity to ask questions and all were answered. The patient agreed with the plan and demonstrated an understanding of the instructions.   Advised to call back or seek an in-person evaluation if worsening  or having  further concerns  in interim. Return for contactt after first month  then assess for step up therapy  .    Shanon Ace, MD

## 2022-12-31 NOTE — Progress Notes (Signed)
HPI female never smoker here with husband. History OSA/CPAP, complicated by history breast cancer, HBP, allergic rhinitis, ITP,  NPSG 05/06/19 AHI 47.7/hr,  CT report from [facility] 12/04/2020- Stable small nodules and radiation changes.  No cancer recurrence or mets. -----------------------------------------------------------------------------------------------------   05/06/22- - 77 year old female never smoker followed for history OSA/CPAP, Lung Nodule, complicated by  Breast Cancer L, HTN, allergic Rhinitis, ITP, DM 2, GERD,  -Trazodone 50  CPAP auto 5-15/ [employer] Download- compliance 100%, AHI 2.2/ hr Body weight today-180 lbs Covid vax-3 Phizer, 1 Moderna -----Pt f/u OSA, CPAP working well 7-8hrs/night of rest. No other questions or concerns today Lung nodule stable- followed at [facility] by Oncology ITP resolved. Download reviewed. Sleeps better- husband confirms.  3/75/49-  77 year old female never smoker followed for history OSA/CPAP, Lung Nodule, complicated by  Breast Cancer L, HTN, allergic Rhinitis, ITP, DM 2, GERD,  -Trazodone 50  CPAP auto 5-15/ [employer] Download- compliance 97%, AHI 3.3/ hr Body weight today-179 lbs Covid vax-3 Phizer, 1 Moderna -----Patient thinks she has a sinus infection-a lot of drainage, nasal stuffiness, and occ dizziness  She often has nasal congestion and has been prone in the past to recurrent sinus infections.  Currently feels some retro-orbital pressure and postnasal drip.  She does not think she has an active infection now but is encouraged to call for antibiotic if it gets worse.  I offered referral to ENT and she declined.  We discussed use of nasal saline rinse. History of lung nodule.  Low-dose screening CT being done through her oncology office at [facility] in [place]. CPAP download reviewed.  She is comfortable and continues doing well.  ROS-see HPI     + = positive Constitutional:   No-   weight loss, night sweats, + fevers, chills, fatigue,  lassitude. HEENT:   + headaches, difficulty swallowing, tooth/dental problems, sore throat,       No-  sneezing, +itching, ear ache, +nasal congestion, +post nasal drip,  CV:  No-   chest pain, orthopnea, PND, swelling in lower extremities, anasarca, dizziness, palpitations Resp: No-   shortness of breath with exertion or at rest.            productive cough,  No non-productive cough,  No- coughing up of blood.           change in color of mucus.  No- wheezing.   Skin: No-   rash or lesions. GI:  No-   heartburn, indigestion, abdominal pain, nausea, vomiting,  GU:  MS:  + joint pain or swelling.   Neuro-     nothing unusual Psych:  No- change in mood or affect. No depression or anxiety.  No memory loss.  OBJ- Physical Exam    General- Alert, Oriented, Affect-appropriate, Distress- none acute, + overweight Skin- rash-none, lesions- none, excoriation- none Lymphadenopathy- none Head- atraumatic            Eyes- Gross vision intact, PERRLA, conjunctivae and secretions clear,  + mild proptosis            Ears- Hearing, canals-normal            Nose- + prominent turbinates, Septal dev +mild, no-mucus, polyps, erosion, perforation             Throat- Mallampati III-IV, mucosa clear , drainage- none, tonsils- atrophic, own teeth Neck- flexible , trachea midline, no stridor , thyroid nl, carotid no bruit Chest - symmetrical excursion , unlabored  Heart/CV- RRR , no murmur , no gallop  , no rub, nl s1 s2                           - JVD- none , edema- none, stasis changes- none, varices- none           Lung- clear, wheeze- none, cough-none,   dullness-none, rub- none           Chest wall-  Abd-  Br/ Gen/ Rectal- Not done, not indicated Extrem- cyanosis- none, clubbing, none, atrophy- none, strength- nl Neuro- grossly intact to observation

## 2023-01-01 ENCOUNTER — Encounter: Payer: Self-pay | Admitting: Internal Medicine

## 2023-01-01 ENCOUNTER — Ambulatory Visit: Payer: Medicare Other | Admitting: Internal Medicine

## 2023-01-01 VITALS — BP 122/60 | HR 73 | Ht 63.5 in | Wt 179.8 lb

## 2023-01-01 DIAGNOSIS — G4733 Obstructive sleep apnea (adult) (pediatric): Secondary | ICD-10-CM | POA: Diagnosis not present

## 2023-01-01 DIAGNOSIS — J310 Chronic rhinitis: Secondary | ICD-10-CM | POA: Diagnosis not present

## 2023-01-01 NOTE — Assessment & Plan Note (Signed)
Variable nasal stuffiness and postnasal drainage. Plan-Zyrtec, azelastine nasal spray.  Discussed trial of nasal saline rinse.

## 2023-01-01 NOTE — Assessment & Plan Note (Signed)
Benefits from CPAP with good compliance and control Plan- continue auto 5-15 

## 2023-01-01 NOTE — Patient Instructions (Addendum)
We can continue CPAP auto 5-15  Try otc nasal saline rinse (NeilMed or store brand)  Ok to continue annual low dose screening chest CT for lung nodules

## 2023-01-19 ENCOUNTER — Encounter: Payer: Self-pay | Admitting: Internal Medicine

## 2023-01-19 NOTE — Telephone Encounter (Signed)
Contact pt. Schedule a virtual visit on Thursday for follow up on Mounjaro.

## 2023-01-22 ENCOUNTER — Encounter: Payer: Self-pay | Admitting: Internal Medicine

## 2023-01-22 ENCOUNTER — Telehealth (INDEPENDENT_AMBULATORY_CARE_PROVIDER_SITE_OTHER): Payer: Medicare Other | Admitting: Internal Medicine

## 2023-01-22 VITALS — Ht 63.5 in | Wt 175.0 lb

## 2023-01-22 DIAGNOSIS — Z79899 Other long term (current) drug therapy: Secondary | ICD-10-CM

## 2023-01-22 DIAGNOSIS — E1165 Type 2 diabetes mellitus with hyperglycemia: Secondary | ICD-10-CM | POA: Diagnosis not present

## 2023-01-22 DIAGNOSIS — Z6830 Body mass index (BMI) 30.0-30.9, adult: Secondary | ICD-10-CM | POA: Diagnosis not present

## 2023-01-22 MED ORDER — MOUNJARO 5 MG/0.5ML ~~LOC~~ SOAJ: 5.0000 mg | SUBCUTANEOUS | 1 refills | Status: DC

## 2023-01-22 MED ORDER — METFORMIN HCL ER 500 MG PO TB24: 1000.0000 mg | ORAL_TABLET | Freq: Every day | ORAL | 1 refills | Status: DC

## 2023-01-22 NOTE — Progress Notes (Signed)
Virtual Visit via Video Note  I connected with Chelsey [name] on 01/22/23 at 11:00 AM EDT by a video enabled telemedicine application and verified that I am speaking with the correct person using two identifiers. Location patient: vehicle  Location provider:work office Persons participating in the virtual visit: patient, provider    Patient aware  of the limitations of evaluation and management by telemedicine and  availability of in person appointments. and agreed to proceed.   HPI: Chelsey [name] presents for video visit  fu  mounjaro med and dm  Has had some mild nausea if eats a lot  no vomiting   no sig weight loss at this time. Had cut back the metformin to 2 x 500 per day from  2 x 500 bid .  Plan  bg fu on schedule  May 24 .   ROS: See pertinent positives and negatives per HPI.  Past Medical History:  Diagnosis Date   Allergy    Aphthous ulcer of mouth 2010   related to chemotherapy    Breast cancer 2010   2010 & 2011   Carpal tunnel syndrome of right wrist    Colitis    Colon polyp    Diabetes mellitus    Diverticulosis    GERD (gastroesophageal reflux disease)    Hyperlipidemia    Hypertension    Lymphedema 08/31/2013   left arm   Osteoarthritis    Pneumonia 07/28/2011   Sinusitis    Sleep apnea    CPAP-setting of 10   Squamous cell carcinoma    left leg    Past Surgical History:  Procedure Laterality Date   AUGMENTATION MAMMAPLASTY Left    CESAREAN SECTION     x 3   COLONOSCOPY  12/02/2018   Dr.Beavers   MASTECTOMY Left 11/23   2010   RECONSTRUCTION BREAST IMMEDIATE / DELAYED W/ TISSUE EXPANDER Left    REDUCTION MAMMAPLASTY Right    SKIN GRAFT     SQUAMOUS CELL CARCINOMA EXCISION Left    Left lower extremity   TONSILLECTOMY     as child   TOTAL KNEE ARTHROPLASTY Left 12/20/2012   Procedure: TOTAL KNEE ARTHROPLASTY;  Surgeon: Loanne DrillingFrank V Aluisio, MD;  Location: [facility];  Service: Orthopedics;  Laterality: Left;   TOTAL KNEE ARTHROPLASTY Right  09/12/2013   Procedure: RIGHT TOTAL KNEE ARTHROPLASTY;  Surgeon: Loanne DrillingFrank V Aluisio, MD;  Location: [facility];  Service: Orthopedics;  Laterality: Right;   TUBAL LIGATION      Family History  Problem Relation Age of Onset   Alzheimer's disease Mother    Sleep apnea Mother    Diabetes Mother    Hypertension Mother    Hyperlipidemia Mother    Breast cancer Mother [id number]       triple negative   Anxiety disorder Daughter    Hypertension Son        pulmonary   Congenital heart disease Son 0       TGA  died age 76    Colon cancer Neg Hx    Colon polyps Neg Hx    Esophageal cancer Neg Hx    Rectal cancer Neg Hx    Stomach cancer Neg Hx     Social History   Tobacco Use   Smoking status: Never   Smokeless tobacco: Never  Vaping Use   Vaping Use: Never used  Substance Use Topics   Alcohol use: Yes    Alcohol/week: 7.0 - 14.0 standard drinks of alcohol  Types: 7 - 14 Glasses of wine per week    Comment: 1-2 glasses every other night   Drug use: No      Current Outpatient Medications:    ALPRAZolam (XANAX) 0.25 MG tablet, TAKE 1 TABLET(0.25 MG) BY MOUTH TWICE DAILY AS NEEDED FOR ANXIETY. AVOID REGULAR USE, Disp: 30 tablet, Rfl: 0   azelastine (ASTELIN) 0.1 % nasal spray, INSTILL 1 TO 2 SPRAYS INTO EACH NOSTRIL TWICE DAILY AS NEEDED, Disp: 30 mL, Rfl: 12   calcium citrate (CALCITRATE - DOSED IN MG ELEMENTAL CALCIUM) 950 (200 Ca) MG tablet, Take 1 tablet by mouth. Every other day, Disp: , Rfl:    cyanocobalamin 1000 MCG tablet, Take 1 tablet by mouth daily., Disp: , Rfl:    denosumab (PROLIA) 60 MG/ML SOLN injection, Inject 60 mg into the skin every 6 (six) months. Administer in upper arm, thigh, or abdomen, Disp: , Rfl:    esomeprazole (NEXIUM) 40 MG capsule, Take 40 mg by mouth as needed. Takes bedtime, Disp: , Rfl:    losartan (COZAAR) 50 MG tablet, TAKE 1 TABLET(50 MG) BY MOUTH DAILY, Disp: 90 tablet, Rfl: 2   rosuvastatin (CRESTOR) 10 MG tablet, TAKE 1 TABLET BY MOUTH EVERY DAY,  Disp: 90 tablet, Rfl: 0   senna (SENOKOT) 8.6 MG tablet, Take 1 tablet by mouth as needed for constipation., Disp: , Rfl:    sertraline (ZOLOFT) 50 MG tablet, Take 1.5 tablets (75 mg total) by mouth daily., Disp: 135 tablet, Rfl: 1   tirzepatide (MOUNJARO) 5 MG/0.5ML Pen, Inject 5 mg into the skin once a week., Disp: 2 mL, Rfl: 1   traZODone (DESYREL) 50 MG tablet, TAKE 1/2 TO 1 TABLET(25 TO 50 MG) BY MOUTH AT BEDTIME AS NEEDED FOR SLEEP, Disp: 30 tablet, Rfl: 3   metFORMIN (GLUCOPHAGE-XR) 500 MG 24 hr tablet, Take 2 tablets (1,000 mg total) by mouth daily with breakfast., Disp: 180 tablet, Rfl: 1  Current Facility-Administered Medications:    0.9 %  sodium chloride infusion, 500 mL, Intravenous, Once, Tressia Danas, MD  EXAM: BP Readings from Last 3 Encounters:  01/01/23 122/60  08/20/22 120/76  05/06/22 112/70    VITALS per patient if applicable: weight  175  Wt Readings from Last 3 Encounters:  01/22/23 175 lb (79.4 kg)  01/01/23 179 lb 12.8 oz (81.6 kg)  12/24/22 175 lb (79.4 kg)     GENERAL: alert, oriented, appears well and in no acute distress non toxic nl conversation  HEENT: atraumatic, conjunttiva clear, no obvious abnormalities on inspection of external nose and ears NECK: normal movements of the head and neck LUNGS: on inspection no signs of respiratory distress, breathing rate appears normal, no obvious gross SOB, gasping or wheezing CV: no obvious cyanosis PSYCH/NEURO: pleasant and cooperative, no obvious depression or anxiety, speech and thought processing grossly intact Lab Results  Component Value Date   WBC 6.0 12/09/2022   HGB 13.7 12/09/2022   HCT 40.4 12/09/2022   PLT 183.0 12/09/2022   GLUCOSE 141 (H) 06/03/2021   CHOL 177 02/19/2022   TRIG 155.0 (H) 02/19/2022   HDL 69.10 02/19/2022   LDLDIRECT 173.9 11/09/2013   LDLCALC 77 02/19/2022   ALT 13 06/03/2021   AST 16 06/03/2021   NA 139 06/03/2021   K 3.9 06/03/2021   CL 104 06/03/2021    CREATININE 0.77 06/03/2021   BUN 11 06/03/2021   CO2 24 06/03/2021   TSH 1.18 02/19/2022   INR 1.0 06/03/2021   HGBA1C 6.6 (A) 08/20/2022  MICROALBUR <0.7 02/19/2022   Labs [facility]  ASSESSMENT AND PLAN:  Discussed the following assessment and plan:    ICD-10-CM   1. Medication management  Z79.899     2. Type 2 diabetes mellitus with hyperglycemia, without long-term current use of insulin  E11.65     3. BMI 30.0-30.9,adult  Z68.30      Increase mounjaro to 5 mg per week  S/e precautions Try to stay on metformin 500 - 100 per day as tolerated  Keep appt May and will fu A1c depending  . Duaghter getting married this summer  Counseled.   Expectant management and discussion of plan and treatment with opportunity to ask questions and all were answered. The patient agreed with the plan and demonstrated an understanding of the instructions.   Advised to call back or seek an in-person evaluation if worsening  or having  further concerns  in interim. Return for as planned in May.    Berniece Andreas, MD

## 2023-01-23 DIAGNOSIS — C50912 Malignant neoplasm of unspecified site of left female breast: Secondary | ICD-10-CM | POA: Diagnosis not present

## 2023-02-03 DIAGNOSIS — Z9011 Acquired absence of right breast and nipple: Secondary | ICD-10-CM | POA: Diagnosis not present

## 2023-02-03 DIAGNOSIS — C50912 Malignant neoplasm of unspecified site of left female breast: Secondary | ICD-10-CM | POA: Diagnosis not present

## 2023-02-03 DIAGNOSIS — R918 Other nonspecific abnormal finding of lung field: Secondary | ICD-10-CM | POA: Diagnosis not present

## 2023-02-03 DIAGNOSIS — N6313 Unspecified lump in the right breast, lower outer quadrant: Secondary | ICD-10-CM | POA: Diagnosis not present

## 2023-02-09 ENCOUNTER — Other Ambulatory Visit: Payer: Self-pay | Admitting: Internal Medicine

## 2023-02-10 DIAGNOSIS — C50412 Malignant neoplasm of upper-outer quadrant of left female breast: Secondary | ICD-10-CM | POA: Diagnosis not present

## 2023-02-12 ENCOUNTER — Telehealth: Payer: Self-pay | Admitting: Internal Medicine

## 2023-02-12 ENCOUNTER — Other Ambulatory Visit: Payer: Self-pay | Admitting: Family

## 2023-02-12 ENCOUNTER — Other Ambulatory Visit: Payer: Self-pay | Admitting: Internal Medicine

## 2023-02-12 NOTE — Telephone Encounter (Signed)
Fix the sig med list to what she is tatking  and send in 90 days with 1 refill

## 2023-02-12 NOTE — Telephone Encounter (Signed)
Prescription Request  02/12/2023  LOV: 08/20/2022  What is the name of the medication or equipment? rosuvastatin (CRESTOR) 10 MG tablet   [employer] called to say the directions are not clear.  Pt states one pill every other day.  Rx says every day.  Please clarify.  Have you contacted your pharmacy to request a refill? Yes   Which pharmacy would you like this sent to?  [facility] [phone] - [place], [place] - [address] Phone: [phone]  Fax: [phone]       Patient notified that their request is being sent to the clinical staff for review and that they should receive a response within 2 business days.   Please advise at Mobile [phone] (mobile)

## 2023-02-13 MED ORDER — ROSUVASTATIN CALCIUM 10 MG PO TABS: 10.0000 mg | ORAL_TABLET | Freq: Every day | ORAL | 1 refills | Status: DC

## 2023-02-13 NOTE — Telephone Encounter (Signed)
Contacted [facility] to update on direction for atorvastatin. Spoke to [place]. He states he updated on pt's chart.   Rx sent in.

## 2023-02-16 DIAGNOSIS — N6313 Unspecified lump in the right breast, lower outer quadrant: Secondary | ICD-10-CM | POA: Diagnosis not present

## 2023-02-16 DIAGNOSIS — G4733 Obstructive sleep apnea (adult) (pediatric): Secondary | ICD-10-CM | POA: Diagnosis not present

## 2023-02-19 DIAGNOSIS — D1801 Hemangioma of skin and subcutaneous tissue: Secondary | ICD-10-CM | POA: Diagnosis not present

## 2023-02-19 DIAGNOSIS — L821 Other seborrheic keratosis: Secondary | ICD-10-CM | POA: Diagnosis not present

## 2023-02-19 DIAGNOSIS — Z85828 Personal history of other malignant neoplasm of skin: Secondary | ICD-10-CM | POA: Diagnosis not present

## 2023-02-19 DIAGNOSIS — L820 Inflamed seborrheic keratosis: Secondary | ICD-10-CM | POA: Diagnosis not present

## 2023-02-23 ENCOUNTER — Encounter: Payer: Self-pay | Admitting: Internal Medicine

## 2023-02-23 MED ORDER — SERTRALINE HCL 50 MG PO TABS: 75.0000 mg | ORAL_TABLET | Freq: Every day | ORAL | 1 refills | Status: DC

## 2023-02-23 MED ORDER — TRAZODONE HCL 50 MG PO TABS: ORAL_TABLET | ORAL | 3 refills | Status: DC

## 2023-02-24 ENCOUNTER — Other Ambulatory Visit: Payer: Self-pay

## 2023-02-24 MED ORDER — ROSUVASTATIN CALCIUM 10 MG PO TABS: 10.0000 mg | ORAL_TABLET | ORAL | 1 refills | Status: DC

## 2023-03-10 NOTE — Progress Notes (Signed)
Chief Complaint  Patient presents with   Annual Exam    HPI: Patient  Chelsey [name]  77 y.o. comes in today for Preventive Health Care visit  and med management   DM :  now on mounjaro  5mg     dec metformin on day of injectin and then 2 x 500 seems to be doing ok  not checking much. No numbness or eye sx   BP  ok on losartan  sometimes light headed when bending over and standing up.  Not checked BP  Last oncology check is clear  pulm nodule follow on ct   Wants platelet ct done in case although has been stable since rx for itp, no bleeding or petechiae  Health Maintenance  Topic Date Due   Medicare Annual Wellness (AWV)  07/24/2017   DTaP/Tdap/Td (3 - Td or Tdap) 10/03/2020   FOOT EXAM  01/19/2021   COVID-19 Vaccine (6 - 2023-24 season) 03/27/2023 (Originally 06/13/2022)   Zoster Vaccines- Shingrix (1 of 2) 06/11/2023 (Originally 04/16/1965)   Pneumonia Vaccine 65+ Years old (2 of 2 - PPSV23 or PCV20) 03/10/2024 (Originally 01/19/2014)   Hepatitis C Screening  03/10/2024 (Originally 04/16/1964)   INFLUENZA VACCINE  05/14/2023   HEMOGLOBIN A1C  09/11/2023   OPHTHALMOLOGY EXAM  12/22/2023   Diabetic kidney evaluation - eGFR measurement  03/10/2024   Diabetic kidney evaluation - Urine ACR  03/10/2024   Colonoscopy  02/12/2029   DEXA SCAN  Completed   HPV VACCINES  Aged Out   Daughter getting married in June     ROS:  notes seems to be still anxious   her daughter in on fluoxetine and seems to be much better  from her baseline. She is on sertraline  and as needed alprazolam  REST of 12 system review negative except as per HPI   Past Medical History:  Diagnosis Date   Allergy    Aphthous ulcer of mouth 2010   related to chemotherapy    Breast cancer [facility]) 2010   2010 & 2011   Carpal tunnel syndrome of right wrist    Colitis    Colon polyp    Diabetes mellitus    Diverticulosis    GERD (gastroesophageal reflux disease)    Hyperlipidemia    Hypertension    Lymphedema  08/31/2013   left arm   Osteoarthritis    Pneumonia 07/28/2011   Sinusitis    Sleep apnea    CPAP-setting of 10   Squamous cell carcinoma    left leg    Past Surgical History:  Procedure Laterality Date   AUGMENTATION MAMMAPLASTY Left    CESAREAN SECTION     x 3   COLONOSCOPY  12/02/2018   Dr.Beavers   MASTECTOMY Left 11/23   2010   RECONSTRUCTION BREAST IMMEDIATE / DELAYED W/ TISSUE EXPANDER Left    REDUCTION MAMMAPLASTY Right    SKIN GRAFT     SQUAMOUS CELL CARCINOMA EXCISION Left    Left lower extremity   TONSILLECTOMY     as child   TOTAL KNEE ARTHROPLASTY Left 12/20/2012   Procedure: TOTAL KNEE ARTHROPLASTY;  Surgeon: Loanne Drilling, MD;  Location: [facility];  Service: Orthopedics;  Laterality: Left;   TOTAL KNEE ARTHROPLASTY Right 09/12/2013   Procedure: RIGHT TOTAL KNEE ARTHROPLASTY;  Surgeon: Loanne Drilling, MD;  Location: [facility];  Service: Orthopedics;  Laterality: Right;   TUBAL LIGATION      Family History  Problem Relation Age of Onset  Alzheimer's disease Mother    Sleep apnea Mother    Diabetes Mother    Hypertension Mother    Hyperlipidemia Mother    Breast cancer Mother 52       triple negative   Anxiety disorder Daughter    Hypertension Son        pulmonary   Congenital heart disease Son 0       TGA  died age 64    Colon cancer Neg Hx    Colon polyps Neg Hx    Esophageal cancer Neg Hx    Rectal cancer Neg Hx    Stomach cancer Neg Hx     Social History   Socioeconomic History   Marital status: Married    Spouse name: Not on file   Number of children: 3   Years of education: Not on file   Highest education level: Not on file  Occupational History   Occupation: Engineer, drilling: [employer]   Occupation: Engineer, drilling: [employer]  Tobacco Use   Smoking status: Never   Smokeless tobacco: Never  Vaping Use   Vaping Use: Never used  Substance and Sexual Activity   Alcohol use: Yes    Alcohol/week: 7.0 - 14.0 standard  drinks of alcohol    Types: 7 - 14 Glasses of wine per week    Comment: 1-2 glasses every other night   Drug use: No   Sexual activity: Yes    Birth control/protection: Post-menopausal, Surgical  Other Topics Concern   Not on file  Social History Narrative   cb x 3   Realtor married    Bereaved  Parent   2 adult children   Mother with dementia and breast cancer   Passed away sept 44 15  Age 51   Social Determinants of Health   Financial Resource Strain: Low Risk  (03/11/2023)   Overall Financial Resource Strain (CARDIA)    Difficulty of Paying Living Expenses: Not hard at all  Food Insecurity: No Food Insecurity (03/11/2023)   Hunger Vital Sign    Worried About Running Out of Food in the Last Year: Never true    Ran Out of Food in the Last Year: Never true  Transportation Needs: No Transportation Needs (03/11/2023)   PRAPARE - Administrator, Civil Service (Medical): No    Lack of Transportation (Non-Medical): No  Physical Activity: Inactive (03/11/2023)   Exercise Vital Sign    Days of Exercise per Week: 0 days    Minutes of Exercise per Session: 0 min  Stress: Stress Concern Present (03/11/2023)   [employer] of Occupational Health - Occupational Stress Questionnaire    Feeling of Stress : Rather much  Social Connections: Moderately Integrated (03/11/2023)   Social Connection and Isolation Panel [NHANES]    Frequency of Communication with Friends and Family: More than three times a week    Frequency of Social Gatherings with Friends and Family: Once a week    Attends Religious Services: Never    Database administrator or Organizations: Yes    Attends Engineer, structural: More than 4 times per year    Marital Status: Married    Outpatient Medications Prior to Visit  Medication Sig Dispense Refill   azelastine (ASTELIN) 0.1 % nasal spray INSTILL 1 TO 2 SPRAYS INTO EACH NOSTRIL TWICE DAILY AS NEEDED 30 mL 12   calcium citrate (CALCITRATE - DOSED IN  MG ELEMENTAL CALCIUM) 950 (200  Ca) MG tablet Take 1 tablet by mouth. Every other day     cyanocobalamin 1000 MCG tablet Take 1 tablet by mouth daily.     esomeprazole (NEXIUM) 40 MG capsule Take 40 mg by mouth as needed. Takes bedtime     metFORMIN (GLUCOPHAGE-XR) 500 MG 24 hr tablet TAKE 2 TABLETS BY MOUTH TWICE DAILY WITH MEALS 360 tablet 0   rosuvastatin (CRESTOR) 10 MG tablet Take 1 tablet (10 mg total) by mouth every other day. 90 tablet 1   senna (SENOKOT) 8.6 MG tablet Take 1 tablet by mouth as needed for constipation.     traZODone (DESYREL) 50 MG tablet TAKE 1/2 TO 1 TABLET(25 TO 50 MG) BY MOUTH AT BEDTIME AS NEEDED FOR SLEEP 30 tablet 3   ALPRAZolam (XANAX) 0.25 MG tablet TAKE 1 TABLET(0.25 MG) BY MOUTH TWICE DAILY AS NEEDED FOR ANXIETY. AVOID REGULAR USE 30 tablet 0   losartan (COZAAR) 50 MG tablet TAKE 1 TABLET(50 MG) BY MOUTH DAILY 90 tablet 2   sertraline (ZOLOFT) 50 MG tablet Take 1.5 tablets (75 mg total) by mouth daily. 135 tablet 1   tirzepatide (MOUNJARO) 5 MG/0.5ML Pen Inject 5 mg into the skin once a week. 2 mL 1   denosumab (PROLIA) 60 MG/ML SOLN injection Inject 60 mg into the skin every 6 (six) months. Administer in upper arm, thigh, or abdomen (Patient not taking: Reported on 03/11/2023)     Facility-Administered Medications Prior to Visit  Medication Dose Route Frequency Provider Last Rate Last Admin   0.9 %  sodium chloride infusion  500 mL Intravenous Once Tressia Danas, MD         EXAM:  BP (!) 110/52 (BP Location: Right Arm, Patient Position: Sitting, Cuff Size: Large)   Pulse 68   Temp 98.3 F (36.8 C) (Oral)   Ht 5\' 3"  (1.6 m)   Wt 172 lb 3.2 oz (78.1 kg)   SpO2 98%   BMI 30.50 kg/m   Body mass index is 30.5 kg/m. Wt Readings from Last 3 Encounters:  03/11/23 172 lb 3.2 oz (78.1 kg)  01/22/23 175 lb (79.4 kg)  01/01/23 179 lb 12.8 oz (81.6 kg)   Wt Readings from Last 3 Encounters:  03/11/23 172 lb 3.2 oz (78.1 kg)  01/22/23 175 lb (79.4 kg)   01/01/23 179 lb 12.8 oz (81.6 kg)   BP Readings from Last 3 Encounters:  03/11/23 (!) 110/52  01/01/23 122/60  08/20/22 120/76     Physical Exam: Vital signs reviewed ZOX:WRUE is a well-developed well-nourished alert cooperative    who appearsr stated age in no acute distress.  HEENT: normocephalic atraumatic , Eyes: PERRL EOM's full, conjunctiva clear, Nares: paten,t no deformity discharge or tenderness., Ears: no deformity EAC's clear TMs with normal landmarks. Mouth: clear OP, no lesions, edema.  Moist mucous membranes. Dentition in adequate repair. NECK: supple without masses, thyromegaly or bruits. CHEST/PULM:  Clear to auscultation and percussion breath sounds equal no wheeze , rales or rhonchi. No chest wall deformities or tenderness. CV: PMI is nondisplaced, S1 S2 no gallops, murmurs, rubs. Peripheral pulses are full without delay.No JVD .  ABDOMEN: Bowel sounds normal nontender  No guard or rebound, no hepato splenomegal no CVA tenderness.   Extremtities:  No clubbing cyanosis or edema, no acute joint swelling or redness no focal atrophy NEURO:  Oriented x3, cranial nerves 3-12 appear to be intact, no obvious focal weakness,gait within normal limits no abnormal reflexes or asymmetrical SKIN: No acute rashes normal  turgor, color, no bruising or petechiae. PSYCH: Oriented, good eye contact, no obvious depression anxiety, cognition and judgment appear normal. LN: no cervical axillaryadenopathy Diabetic Foot Exam - Simple   No data filed   Feet sense grossly intact  pulses normal  no lesions  Lab Results  Component Value Date   WBC 5.9 03/11/2023   HGB 13.7 03/11/2023   HCT 41.4 03/11/2023   PLT 214.0 03/11/2023   GLUCOSE 97 03/11/2023   CHOL 161 03/11/2023   TRIG 105.0 03/11/2023   HDL 62.20 03/11/2023   LDLDIRECT 173.9 11/09/2013   LDLCALC 78 03/11/2023   ALT 10 03/11/2023   AST 15 03/11/2023   NA 140 03/11/2023   K 4.2 03/11/2023   CL 103 03/11/2023   CREATININE  0.72 03/11/2023   BUN 13 03/11/2023   CO2 28 03/11/2023   TSH 1.69 03/11/2023   INR 1.0 06/03/2021   HGBA1C 6.1 03/11/2023   MICROALBUR 1.0 03/11/2023    BP Readings from Last 3 Encounters:  03/11/23 (!) 110/52  01/01/23 122/60  08/20/22 120/76    Lab updated planned reviewed with patient   ASSESSMENT AND PLAN:  Discussed the following assessment and plan:    ICD-10-CM   1. Visit for preventive health examination  Z00.00     2. Essential hypertension  I10 Microalbumin/Creatinine Ratio, Urine    CBC with Differential/Platelet    Comprehensive metabolic panel    Lipid panel    TSH    Hemoglobin A1c    3. Adjustment disorder with anxious mood  F43.22     4. Medication management  Z79.899 Microalbumin/Creatinine Ratio, Urine    CBC with Differential/Platelet    Comprehensive metabolic panel    Lipid panel    TSH    Hemoglobin A1c    5. HX: breast cancer  Z85.3 Microalbumin/Creatinine Ratio, Urine    CBC with Differential/Platelet    Comprehensive metabolic panel    Lipid panel    TSH    Hemoglobin A1c    6. Type 2 diabetes mellitus with hyperglycemia, without long-term current use of insulin (HCC)  E11.65 Microalbumin/Creatinine Ratio, Urine    CBC with Differential/Platelet    Comprehensive metabolic panel    Lipid panel    TSH    Hemoglobin A1c    7. Obstructive sleep apnea  G47.33 Microalbumin/Creatinine Ratio, Urine    CBC with Differential/Platelet    Comprehensive metabolic panel    Lipid panel    TSH    Hemoglobin A1c    8. Hyperlipidemia, unspecified hyperlipidemia type  E78.5 Microalbumin/Creatinine Ratio, Urine    CBC with Differential/Platelet    Comprehensive metabolic panel    Lipid panel    TSH    Hemoglobin A1c    9. Orthostatic dizziness  R42 Microalbumin/Creatinine Ratio, Urine    CBC with Differential/Platelet    Comprehensive metabolic panel    Lipid panel    TSH    Hemoglobin A1c    Mounjaro has been helpful  Her sx seem more  like low bp orthostatics than  hypoglycemia .  Dec dose of losartan and check bp  and bg with sx .  Otherwise contact in one month about  meds and how doing  Change  sertraline to fluoxetine ( see below instructions ) to see if more helpful for anxiety  since daughter responded well to this med .  Return for contact in 1 months .  Patient Care Team: Madelin Headings, MD as PCP - General Aluisio,  Homero Fellers, MD as Attending Physician (Orthopedic Surgery) Waymon Budge, MD as Consulting Physician (Pulmonary Disease) Ernesto Rutherford, MD as Consulting Physician (Ophthalmology) Prudence Davidson, [facility] (Inactive) as Pharmacist (Pharmacist) Lennie Odor, MD as Physician Assistant (Internal Medicine) Woodroe Mode, MD as Referring Physician (Plastic Surgery) Lennie Odor, MD as Physician Assistant (Internal Medicine) Patient Instructions  Try decreasing  losartan to 25 mg per day.   Because of the orthostatic dizziness.  Decrease metformin 500 mg per day once a day .  We can switch from sertraline to prozac 20 mg . Per day.  Decrease sertraline 50 for a few days and then  change over. .  Will refill alprazolam   also .   Lab today nf  and go from there   Plan 3 -6 months .   Let us  know how doing in a month on new medication and dizziness episodes  or visit .      Neta Mends. Margueritte Guthridge M.D.

## 2023-03-11 ENCOUNTER — Other Ambulatory Visit: Payer: Self-pay | Admitting: Internal Medicine

## 2023-03-11 ENCOUNTER — Ambulatory Visit (INDEPENDENT_AMBULATORY_CARE_PROVIDER_SITE_OTHER): Payer: Medicare Other | Admitting: Internal Medicine

## 2023-03-11 ENCOUNTER — Encounter: Payer: Self-pay | Admitting: Internal Medicine

## 2023-03-11 VITALS — BP 110/52 | HR 68 | Temp 98.3°F | Ht 63.0 in | Wt 172.2 lb

## 2023-03-11 DIAGNOSIS — Z7984 Long term (current) use of oral hypoglycemic drugs: Secondary | ICD-10-CM | POA: Diagnosis not present

## 2023-03-11 DIAGNOSIS — E1165 Type 2 diabetes mellitus with hyperglycemia: Secondary | ICD-10-CM | POA: Diagnosis not present

## 2023-03-11 DIAGNOSIS — R42 Dizziness and giddiness: Secondary | ICD-10-CM | POA: Diagnosis not present

## 2023-03-11 DIAGNOSIS — G4733 Obstructive sleep apnea (adult) (pediatric): Secondary | ICD-10-CM

## 2023-03-11 DIAGNOSIS — E785 Hyperlipidemia, unspecified: Secondary | ICD-10-CM

## 2023-03-11 DIAGNOSIS — Z79899 Other long term (current) drug therapy: Secondary | ICD-10-CM | POA: Diagnosis not present

## 2023-03-11 DIAGNOSIS — Z853 Personal history of malignant neoplasm of breast: Secondary | ICD-10-CM | POA: Diagnosis not present

## 2023-03-11 DIAGNOSIS — Z7985 Long-term (current) use of injectable non-insulin antidiabetic drugs: Secondary | ICD-10-CM

## 2023-03-11 DIAGNOSIS — F4322 Adjustment disorder with anxiety: Secondary | ICD-10-CM | POA: Diagnosis not present

## 2023-03-11 DIAGNOSIS — Z0000 Encounter for general adult medical examination without abnormal findings: Secondary | ICD-10-CM | POA: Diagnosis not present

## 2023-03-11 DIAGNOSIS — I10 Essential (primary) hypertension: Secondary | ICD-10-CM | POA: Diagnosis not present

## 2023-03-11 LAB — CBC WITH DIFFERENTIAL/PLATELET
Basophils Absolute: 0 10*3/uL (ref 0.0–0.1)
Basophils Relative: 0.8 % (ref 0.0–3.0)
Eosinophils Absolute: 0.1 10*3/uL (ref 0.0–0.7)
Eosinophils Relative: 2.1 % (ref 0.0–5.0)
HCT: 41.4 % (ref 36.0–46.0)
Hemoglobin: 13.7 g/dL (ref 12.0–15.0)
Lymphocytes Relative: 35.3 % (ref 12.0–46.0)
Lymphs Abs: 2.1 10*3/uL (ref 0.7–4.0)
MCHC: 33.2 g/dL (ref 30.0–36.0)
MCV: 90.5 fl (ref 78.0–100.0)
Monocytes Absolute: 0.6 10*3/uL (ref 0.1–1.0)
Monocytes Relative: 10.7 % (ref 3.0–12.0)
Neutro Abs: 3 10*3/uL (ref 1.4–7.7)
Neutrophils Relative %: 51.1 % (ref 43.0–77.0)
Platelets: 214 10*3/uL (ref 150.0–400.0)
RBC: 4.57 Mil/uL (ref 3.87–5.11)
RDW: 13.4 % (ref 11.5–15.5)
WBC: 5.9 10*3/uL (ref 4.0–10.5)

## 2023-03-11 LAB — LIPID PANEL
Cholesterol: 161 mg/dL (ref 0–200)
HDL: 62.2 mg/dL (ref >39.00)
LDL Cholesterol: 78 mg/dL (ref 0–99)
NonHDL: 98.93
Total CHOL/HDL Ratio: 3
Triglycerides: 105 mg/dL (ref 0.0–149.0)
VLDL: 21 mg/dL (ref 0.0–40.0)

## 2023-03-11 LAB — COMPREHENSIVE METABOLIC PANEL
ALT: 10 U/L (ref 0–35)
AST: 15 U/L (ref 0–37)
Albumin: 4.5 g/dL (ref 3.5–5.2)
Alkaline Phosphatase: 60 U/L (ref 39–117)
BUN: 13 mg/dL (ref 6–23)
CO2: 28 mEq/L (ref 19–32)
Calcium: 10 mg/dL (ref 8.4–10.5)
Chloride: 103 mEq/L (ref 96–112)
Creatinine, Ser: 0.72 mg/dL (ref 0.40–1.20)
GFR: 81.52 mL/min (ref >60.00)
Glucose, Bld: 97 mg/dL (ref 70–99)
Potassium: 4.2 mEq/L (ref 3.5–5.1)
Sodium: 140 mEq/L (ref 135–145)
Total Bilirubin: 0.6 mg/dL (ref 0.2–1.2)
Total Protein: 7.6 g/dL (ref 6.0–8.3)

## 2023-03-11 LAB — MICROALBUMIN / CREATININE URINE RATIO
Creatinine,U: 117.4 mg/dL
Microalb Creat Ratio: 0.8 mg/g (ref 0.0–30.0)
Microalb, Ur: 1 mg/dL (ref 0.0–1.9)

## 2023-03-11 LAB — TSH: TSH: 1.69 u[IU]/mL (ref 0.35–5.50)

## 2023-03-11 LAB — HEMOGLOBIN A1C: Hgb A1c MFr Bld: 6.1 % (ref 4.6–6.5)

## 2023-03-11 MED ORDER — MOUNJARO 5 MG/0.5ML ~~LOC~~ SOAJ: 5.0000 mg | SUBCUTANEOUS | 1 refills | Status: DC

## 2023-03-11 MED ORDER — FLUOXETINE HCL 20 MG PO CAPS: 20.0000 mg | ORAL_CAPSULE | Freq: Every day | ORAL | 3 refills | Status: DC

## 2023-03-11 MED ORDER — ALPRAZOLAM 0.25 MG PO TABS: ORAL_TABLET | ORAL | 0 refills | Status: DC

## 2023-03-11 NOTE — Patient Instructions (Addendum)
Try decreasing  losartan to 25 mg per day.   Because of the orthostatic dizziness.  Decrease metformin 500 mg per day once a day .  We can switch from sertraline to prozac 20 mg . Per day.  Decrease sertraline 50 for a few days and then  change over. .  Will refill alprazolam   also .   Lab today nf  and go from there   Plan 3 -6 months .   Let us  know how doing in a month on new medication and dizziness episodes  or visit .

## 2023-03-14 NOTE — Progress Notes (Signed)
All results are good  platelets normal 214k....  NO worries about the lab results

## 2023-03-15 ENCOUNTER — Encounter: Payer: Self-pay | Admitting: Internal Medicine

## 2023-03-31 ENCOUNTER — Telehealth (INDEPENDENT_AMBULATORY_CARE_PROVIDER_SITE_OTHER): Payer: Medicare Other | Admitting: Internal Medicine

## 2023-03-31 ENCOUNTER — Encounter: Payer: Self-pay | Admitting: Internal Medicine

## 2023-03-31 VITALS — BP 102/65

## 2023-03-31 DIAGNOSIS — Z79899 Other long term (current) drug therapy: Secondary | ICD-10-CM | POA: Diagnosis not present

## 2023-03-31 DIAGNOSIS — E1165 Type 2 diabetes mellitus with hyperglycemia: Secondary | ICD-10-CM | POA: Diagnosis not present

## 2023-03-31 DIAGNOSIS — I10 Essential (primary) hypertension: Secondary | ICD-10-CM | POA: Diagnosis not present

## 2023-03-31 DIAGNOSIS — R42 Dizziness and giddiness: Secondary | ICD-10-CM

## 2023-03-31 MED ORDER — LOSARTAN POTASSIUM 25 MG PO TABS
12.5000 mg | ORAL_TABLET | Freq: Every day | ORAL | 1 refills | Status: DC
Start: 2023-03-31 — End: 2023-06-10

## 2023-03-31 NOTE — Progress Notes (Signed)
Virtual Visit via Video Note  I connected with Chelsey [name] on 03/31/23 at  1:30 PM EDT by a video enabled telemedicine application and verified that I am speaking with the correct person using two identifiers. Location patient: home Location provider:work office Persons participating in the virtual visit: patient, provider  Patient aware  of the limitations of evaluation and management by telemedicine and  availability of in person appointments. and agreed to proceed.   HPI: Chelsey [name] presents for video visit  still light headed  when stands up.  Now on losartan 25 per day sometimes rorar? In ear  like under chemo no falling  usually when getting up out of car when working  Metformin once in am  Bp June 130/68  111BG Then 102/65 and 110 bg.  Change sertraline to porzac ok still feels ocd obessive thoughts  and anxiety .    Now on 20 mg for about 2 weeks Wedding went well and still busy.  Moounjaro no concerning se  reported  ROS: See pertinent positives and negatives per HPI.  Past Medical History:  Diagnosis Date   Allergy    Aphthous ulcer of mouth 2010   related to chemotherapy    Breast cancer [facility]) 2010   2010 & 2011   Carpal tunnel syndrome of right wrist    Colitis    Colon polyp    Diabetes mellitus    Diverticulosis    GERD (gastroesophageal reflux disease)    Hyperlipidemia    Hypertension    Lymphedema 08/31/2013   left arm   Osteoarthritis    Pneumonia 07/28/2011   Sinusitis    Sleep apnea    CPAP-setting of 10   Squamous cell carcinoma    left leg    Past Surgical History:  Procedure Laterality Date   AUGMENTATION MAMMAPLASTY Left    CESAREAN SECTION     x 3   COLONOSCOPY  12/02/2018   Dr.Beavers   MASTECTOMY Left 11/23   2010   RECONSTRUCTION BREAST IMMEDIATE / DELAYED W/ TISSUE EXPANDER Left    REDUCTION MAMMAPLASTY Right    SKIN GRAFT     SQUAMOUS CELL CARCINOMA EXCISION Left    Left lower extremity   TONSILLECTOMY     as child    TOTAL KNEE ARTHROPLASTY Left 12/20/2012   Procedure: TOTAL KNEE ARTHROPLASTY;  Surgeon: Loanne Drilling, MD;  Location: [facility];  Service: Orthopedics;  Laterality: Left;   TOTAL KNEE ARTHROPLASTY Right 09/12/2013   Procedure: RIGHT TOTAL KNEE ARTHROPLASTY;  Surgeon: Loanne Drilling, MD;  Location: [facility];  Service: Orthopedics;  Laterality: Right;   TUBAL LIGATION      Family History  Problem Relation Age of Onset   Alzheimer's disease Mother    Sleep apnea Mother    Diabetes Mother    Hypertension Mother    Hyperlipidemia Mother    Breast cancer Mother 50       triple negative   Anxiety disorder Daughter    Hypertension Son        pulmonary   Congenital heart disease Son 0       TGA  died age 4    Colon cancer Neg Hx    Colon polyps Neg Hx    Esophageal cancer Neg Hx    Rectal cancer Neg Hx    Stomach cancer Neg Hx     Social History   Tobacco Use   Smoking status: Never   Smokeless tobacco: Never  Vaping Use   Vaping Use: Never used  Substance Use Topics   Alcohol use: Yes    Alcohol/week: 7.0 - 14.0 standard drinks of alcohol    Types: 7 - 14 Glasses of wine per week    Comment: 1-2 glasses every other night   Drug use: No      Current Outpatient Medications:    ALPRAZolam (XANAX) 0.25 MG tablet, TAKE 1 TABLET(0.25 MG) BY MOUTH TWICE DAILY AS NEEDED FOR ANXIETY. AVOID REGULAR USE, Disp: 30 tablet, Rfl: 0   azelastine (ASTELIN) 0.1 % nasal spray, INSTILL 1 TO 2 SPRAYS INTO EACH NOSTRIL TWICE DAILY AS NEEDED, Disp: 30 mL, Rfl: 12   calcium citrate (CALCITRATE - DOSED IN MG ELEMENTAL CALCIUM) 950 (200 Ca) MG tablet, Take 1 tablet by mouth. Every other day, Disp: , Rfl:    cyanocobalamin 1000 MCG tablet, Take 1 tablet by mouth daily., Disp: , Rfl:    esomeprazole (NEXIUM) 40 MG capsule, Take 40 mg by mouth as needed. Takes bedtime, Disp: , Rfl:    FLUoxetine (PROZAC) 20 MG capsule, Take 1 capsule (20 mg total) by mouth daily. Change from sertraline, Disp: 30  capsule, Rfl: 3   losartan (COZAAR) 25 MG tablet, Take 0.5 tablets (12.5 mg total) by mouth daily. Dosage change, Disp: 45 tablet, Rfl: 1   metFORMIN (GLUCOPHAGE-XR) 500 MG 24 hr tablet, TAKE 2 TABLETS BY MOUTH TWICE DAILY WITH MEALS, Disp: 360 tablet, Rfl: 0   rosuvastatin (CRESTOR) 10 MG tablet, Take 1 tablet (10 mg total) by mouth every other day., Disp: 90 tablet, Rfl: 1   senna (SENOKOT) 8.6 MG tablet, Take 1 tablet by mouth as needed for constipation., Disp: , Rfl:    tirzepatide (MOUNJARO) 5 MG/0.5ML Pen, Inject 5 mg into the skin once a week., Disp: 2 mL, Rfl: 1   traZODone (DESYREL) 50 MG tablet, TAKE 1/2 TO 1 TABLET(25 TO 50 MG) BY MOUTH AT BEDTIME AS NEEDED FOR SLEEP, Disp: 30 tablet, Rfl: 3  Current Facility-Administered Medications:    0.9 %  sodium chloride infusion, 500 mL, Intravenous, Once, Tressia Danas, MD  EXAM: BP Readings from Last 3 Encounters:  03/31/23 102/65  03/11/23 (!) 110/52  01/01/23 122/60   167.5  no clothes  Wt Readings from Last 3 Encounters:  03/11/23 172 lb 3.2 oz (78.1 kg)  01/22/23 175 lb (79.4 kg)  01/01/23 179 lb 12.8 oz (81.6 kg)  Weight down to 167 wp clothes at hokme   VITALS per patient if applicable:  GENERAL: alert, oriented, appears well and in no acute distress  HEENT: atraumatic, conjunttiva clear, no obvious abnormalities on inspection of external nose and ears  NECK: normal movements of the head and neck  LUNGS: on inspection no signs of respiratory distress, breathing rate appears normal, no obvious gross SOB, gasping or wheezing  CV: no obvious cyanosis  MS: moves all visible extremities without noticeable abnormality  PSYCH/NEURO: pleasant and cooperative, no obvious depression or anxiety, speech and thought processing grossly intact Lab Results  Component Value Date   WBC 5.9 03/11/2023   HGB 13.7 03/11/2023   HCT 41.4 03/11/2023   PLT 214.0 03/11/2023   GLUCOSE 97 03/11/2023   CHOL 161 03/11/2023   TRIG 105.0  03/11/2023   HDL 62.20 03/11/2023   LDLDIRECT 173.9 11/09/2013   LDLCALC 78 03/11/2023   ALT 10 03/11/2023   AST 15 03/11/2023   NA 140 03/11/2023   K 4.2 03/11/2023   CL 103 03/11/2023  CREATININE 0.72 03/11/2023   BUN 13 03/11/2023   CO2 28 03/11/2023   TSH 1.69 03/11/2023   INR 1.0 06/03/2021   HGBA1C 6.1 03/11/2023   MICROALBUR 1.0 03/11/2023    ASSESSMENT AND PLAN:  Discussed the following assessment and plan:    ICD-10-CM   1. Orthostatic dizziness  R42     2. Type 2 diabetes mellitus with hyperglycemia, without long-term current use of insulin (HCC)  E11.65     3. Medication management  Z79.899     4. Essential hypertension  I10      Suspect still bp  low on meds as orthostatic although some co of hearing sx( reminds her of  chemo se )  stop losartan until bp rises and then 12.5   send info in a few weeks about status  Stay on mounjharo   bg is much better as well as weight coming down  Anxiety obsession thoughts not worse uncertin next step when stable to poss increase fluoxetine  dose  Counseled.   Expectant management and discussion of plan and treatment with opportunity to ask questions and all were answered. The patient agreed with the plan and demonstrated an understanding of the instructions.  plan message about bp light headed ness  in a few weeks and go from there. May adjust fluoexetine does  she is still taking trazodone at night 50 mg  Advised to call back or seek an in-person evaluation if worsening  or having  further concerns  in interim. No follow-ups on file.    Berniece Andreas, MD

## 2023-03-31 NOTE — Patient Instructions (Addendum)
Stop losartan for 1 week and then can restart  at 12.5 mg per day .when creeps up to 135 -140 range   monitor   Stay hydrated .

## 2023-05-10 ENCOUNTER — Other Ambulatory Visit: Payer: Self-pay | Admitting: Family

## 2023-05-13 ENCOUNTER — Ambulatory Visit (INDEPENDENT_AMBULATORY_CARE_PROVIDER_SITE_OTHER): Payer: Medicare Other | Admitting: Internal Medicine

## 2023-05-13 ENCOUNTER — Encounter: Payer: Self-pay | Admitting: Internal Medicine

## 2023-05-13 VITALS — BP 126/64 | HR 58 | Temp 97.9°F | Ht 63.0 in | Wt 165.8 lb

## 2023-05-13 DIAGNOSIS — Z6829 Body mass index (BMI) 29.0-29.9, adult: Secondary | ICD-10-CM

## 2023-05-13 DIAGNOSIS — Z79899 Other long term (current) drug therapy: Secondary | ICD-10-CM | POA: Diagnosis not present

## 2023-05-13 DIAGNOSIS — R42 Dizziness and giddiness: Secondary | ICD-10-CM | POA: Diagnosis not present

## 2023-05-13 DIAGNOSIS — I10 Essential (primary) hypertension: Secondary | ICD-10-CM | POA: Diagnosis not present

## 2023-05-13 DIAGNOSIS — E1169 Type 2 diabetes mellitus with other specified complication: Secondary | ICD-10-CM

## 2023-05-13 DIAGNOSIS — Z7985 Long-term (current) use of injectable non-insulin antidiabetic drugs: Secondary | ICD-10-CM

## 2023-05-13 DIAGNOSIS — E669 Obesity, unspecified: Secondary | ICD-10-CM

## 2023-05-13 DIAGNOSIS — Z853 Personal history of malignant neoplasm of breast: Secondary | ICD-10-CM

## 2023-05-13 DIAGNOSIS — E1165 Type 2 diabetes mellitus with hyperglycemia: Secondary | ICD-10-CM

## 2023-05-13 MED ORDER — TIRZEPATIDE 7.5 MG/0.5ML ~~LOC~~ SOAJ: 7.5000 mg | SUBCUTANEOUS | 3 refills | Status: DC

## 2023-05-13 MED ORDER — FLUOXETINE HCL 20 MG PO CAPS: 20.0000 mg | ORAL_CAPSULE | Freq: Two times a day (BID) | ORAL | 3 refills | Status: DC

## 2023-05-13 NOTE — Patient Instructions (Addendum)
Try increase the   fluoxetine 20 mg 2 x   per day . Can take 40 at the same time.  BP seems better   Take 1/2   losartan 25 mg per day ( total 12.5)  Ok to increase to  mounjaro 7.5 weekly .       Wt Readings from Last 3 Encounters:  05/13/23 165 lb 12.8 oz (75.2 kg)  03/11/23 172 lb 3.2 oz (78.1 kg)  01/22/23 175 lb (79.4 kg)

## 2023-05-13 NOTE — Progress Notes (Signed)
Chief Complaint  Patient presents with   Medical Management of Chronic Issues    Follow up on mounjaro and prozac   Follow-up    Pt reports every now and then still feel lightheadedness. Not experiencing sx currently.      HPI: Chelsey [name] 77 y.o. come in for Chronic disease management  Now on mounjaro 5 mg  pleased with BG lower and weight loss  coming no sig se .   Dizziness is going away . After stopping the losartan a bit and adding back 12.5 when rising to the 130 range  Bp is good  119/72 at this time  Some readings  forgot to bring monitor to check   Prozac  doing better than sertraline.  Still has ocd checking family hx  still not suppressed ( father had this and daughter some) constant checking    Busy in relator business   ROS: See pertinent positives and negatives per HPI.  Past Medical History:  Diagnosis Date   Allergy    Aphthous ulcer of mouth 2010   related to chemotherapy    Breast cancer [facility]) 2010   2010 & 2011   Carpal tunnel syndrome of right wrist    Colitis    Colon polyp    Diabetes mellitus    Diverticulosis    GERD (gastroesophageal reflux disease)    Hyperlipidemia    Hypertension    Lymphedema 08/31/2013   left arm   Osteoarthritis    Pneumonia 07/28/2011   Sinusitis    Sleep apnea    CPAP-setting of 10   Squamous cell carcinoma    left leg    Family History  Problem Relation Age of Onset   Alzheimer's disease Mother    Sleep apnea Mother    Diabetes Mother    Hypertension Mother    Hyperlipidemia Mother    Breast cancer Mother 77       triple negative   Anxiety disorder Daughter    Hypertension Son        pulmonary   Congenital heart disease Son 0       TGA  died age 30    Colon cancer Neg Hx    Colon polyps Neg Hx    Esophageal cancer Neg Hx    Rectal cancer Neg Hx    Stomach cancer Neg Hx     Social History   Socioeconomic History   Marital status: Married    Spouse name: Not on file   Number of children: 3    Years of education: Not on file   Highest education level: Not on file  Occupational History   Occupation: Engineer, drilling: [employer]   Occupation: Engineer, drilling: [employer]  Tobacco Use   Smoking status: Never   Smokeless tobacco: Never  Vaping Use   Vaping status: Never Used  Substance and Sexual Activity   Alcohol use: Yes    Alcohol/week: 7.0 - 14.0 standard drinks of alcohol    Types: 7 - 14 Glasses of wine per week    Comment: 1-2 glasses every other night   Drug use: No   Sexual activity: Yes    Birth control/protection: Post-menopausal, Surgical  Other Topics Concern   Not on file  Social History Narrative   cb x 3   Realtor married    Bereaved  Parent   2 adult children   Mother with dementia and breast cancer   Passed  away sept 22 15  Age 21   Social Determinants of Health   Financial Resource Strain: Low Risk  (03/11/2023)   Overall Financial Resource Strain (CARDIA)    Difficulty of Paying Living Expenses: Not hard at all  Food Insecurity: No Food Insecurity (03/11/2023)   Hunger Vital Sign    Worried About Running Out of Food in the Last Year: Never true    Ran Out of Food in the Last Year: Never true  Transportation Needs: No Transportation Needs (03/11/2023)   PRAPARE - Administrator, Civil Service (Medical): No    Lack of Transportation (Non-Medical): No  Physical Activity: Inactive (03/11/2023)   Exercise Vital Sign    Days of Exercise per Week: 0 days    Minutes of Exercise per Session: 0 min  Stress: Stress Concern Present (03/11/2023)   [employer] of Occupational Health - Occupational Stress Questionnaire    Feeling of Stress : Rather much  Social Connections: Moderately Integrated (03/11/2023)   Social Connection and Isolation Panel [NHANES]    Frequency of Communication with Friends and Family: More than three times a week    Frequency of Social Gatherings with Friends and Family: Once a week    Attends  Religious Services: Never    Database administrator or Organizations: Yes    Attends Engineer, structural: More than 4 times per year    Marital Status: Married    Outpatient Medications Prior to Visit  Medication Sig Dispense Refill   ALPRAZolam (XANAX) 0.25 MG tablet TAKE 1 TABLET(0.25 MG) BY MOUTH TWICE DAILY AS NEEDED FOR ANXIETY. AVOID REGULAR USE 30 tablet 0   azelastine (ASTELIN) 0.1 % nasal spray INSTILL 1 TO 2 SPRAYS INTO EACH NOSTRIL TWICE DAILY AS NEEDED 30 mL 12   calcium citrate (CALCITRATE - DOSED IN MG ELEMENTAL CALCIUM) 950 (200 Ca) MG tablet Take 1 tablet by mouth. Every other day     cyanocobalamin 1000 MCG tablet Take 1 tablet by mouth daily.     esomeprazole (NEXIUM) 40 MG capsule Take 40 mg by mouth as needed. Takes bedtime     losartan (COZAAR) 25 MG tablet Take 0.5 tablets (12.5 mg total) by mouth daily. Dosage change 45 tablet 1   metFORMIN (GLUCOPHAGE-XR) 500 MG 24 hr tablet TAKE 2 TABLETS BY MOUTH TWICE DAILY WITH MEALS (Patient taking differently: Take 500 mg by mouth daily.) 360 tablet 0   rosuvastatin (CRESTOR) 10 MG tablet Take 1 tablet (10 mg total) by mouth every other day. 90 tablet 1   senna (SENOKOT) 8.6 MG tablet Take 1 tablet by mouth as needed for constipation.     traZODone (DESYREL) 50 MG tablet TAKE 1/2 TO 1 TABLET(25 TO 50 MG) BY MOUTH AT BEDTIME AS NEEDED FOR SLEEP 30 tablet 3   FLUoxetine (PROZAC) 20 MG capsule Take 1 capsule (20 mg total) by mouth daily. Change from sertraline 30 capsule 3   tirzepatide (MOUNJARO) 5 MG/0.5ML Pen Inject 5 mg into the skin once a week. 2 mL 1   Facility-Administered Medications Prior to Visit  Medication Dose Route Frequency Provider Last Rate Last Admin   0.9 %  sodium chloride infusion  500 mL Intravenous Once Tressia Danas, MD         EXAM:  BP 126/64 (BP Location: Right Arm, Patient Position: Sitting, Cuff Size: Large)   Pulse (!) 58   Temp 97.9 F (36.6 C) (Oral)   Ht 5\' 3"  (1.6 m)  Wt  165 lb 12.8 oz (75.2 kg)   SpO2 98%   BMI 29.37 kg/m   Body mass index is 29.37 kg/m.  GENERAL: vitals reviewed and listed above, alert, oriented, appears well hydrated and in no acute distress HEENT: atraumatic, conjunctiva  clear, no obvious abnormalities on inspection of external nose and ears O NECK: no obvious masses on inspection palpation  LMS: moves all extremities without noticeable focal  abnormality PSYCH: pleasant and cooperative, no obvious depression or anxiety Lab Results  Component Value Date   WBC 5.9 03/11/2023   HGB 13.7 03/11/2023   HCT 41.4 03/11/2023   PLT 214.0 03/11/2023   GLUCOSE 97 03/11/2023   CHOL 161 03/11/2023   TRIG 105.0 03/11/2023   HDL 62.20 03/11/2023   LDLDIRECT 173.9 11/09/2013   LDLCALC 78 03/11/2023   ALT 10 03/11/2023   AST 15 03/11/2023   NA 140 03/11/2023   K 4.2 03/11/2023   CL 103 03/11/2023   CREATININE 0.72 03/11/2023   BUN 13 03/11/2023   CO2 28 03/11/2023   TSH 1.69 03/11/2023   INR 1.0 06/03/2021   HGBA1C 6.1 03/11/2023   MICROALBUR 1.0 03/11/2023   BP Readings from Last 3 Encounters:  05/13/23 126/64  03/31/23 102/65  03/11/23 (!) 110/52    ASSESSMENT AND PLAN:  Discussed the following assessment and plan:  Type 2 diabetes mellitus with hyperglycemia, without long-term current use of insulin (HCC)  Essential hypertension  Medication management - ocd and anxiety  Orthostatic dizziness - improved on adj of losartan  HX: breast cancer  Type 2 diabetes mellitus with obesity (HCC) - improved bmp now under 30! Trial increase fluoxetine to 40 per day   In mounjaro 7.5 as tolerated weekly Stay on 12.5 losartan as tolerated Virtual visit in about a month on these changes and see how doing .  -Patient advised to return or notify health care team  if  new concerns arise.  Patient Instructions  Try increase the   fluoxetine 20 mg 2 x   per day . Can take 40 at the same time.  BP seems better   Take 1/2    losartan 25 mg per day ( total 12.5)  Ok to increase to  mounjaro 7.5 weekly .       Wt Readings from Last 3 Encounters:  05/13/23 165 lb 12.8 oz (75.2 kg)  03/11/23 172 lb 3.2 oz (78.1 kg)  01/22/23 175 lb (79.4 kg)      Oretta Berkland K. Graeden Bitner M.D.

## 2023-05-18 DIAGNOSIS — G4733 Obstructive sleep apnea (adult) (pediatric): Secondary | ICD-10-CM | POA: Diagnosis not present

## 2023-06-04 ENCOUNTER — Other Ambulatory Visit: Payer: Self-pay | Admitting: Internal Medicine

## 2023-06-10 ENCOUNTER — Encounter: Payer: Self-pay | Admitting: Internal Medicine

## 2023-06-10 ENCOUNTER — Telehealth (INDEPENDENT_AMBULATORY_CARE_PROVIDER_SITE_OTHER): Payer: Medicare Other | Admitting: Internal Medicine

## 2023-06-10 VITALS — Ht 63.0 in | Wt 165.0 lb

## 2023-06-10 DIAGNOSIS — Z79899 Other long term (current) drug therapy: Secondary | ICD-10-CM | POA: Diagnosis not present

## 2023-06-10 DIAGNOSIS — R42 Dizziness and giddiness: Secondary | ICD-10-CM

## 2023-06-10 DIAGNOSIS — I10 Essential (primary) hypertension: Secondary | ICD-10-CM | POA: Diagnosis not present

## 2023-06-10 DIAGNOSIS — E1165 Type 2 diabetes mellitus with hyperglycemia: Secondary | ICD-10-CM | POA: Diagnosis not present

## 2023-06-10 MED ORDER — LOSARTAN POTASSIUM 25 MG PO TABS: 12.5000 mg | ORAL_TABLET | Freq: Every day | ORAL | 1 refills | Status: DC

## 2023-06-10 NOTE — Progress Notes (Signed)
Virtual Visit via Video Note  I connected with Chelsey [name] on 06/10/23 at  4:00 PM EDT by a video enabled telemedicine application and verified that I am speaking with the correct person using two identifiers. Location patient: home Location provider:work office Persons participating in the virtual visit: patient, provider   Patient aware  of the limitations of evaluation and management by telemedicine and  availability of in person appointments. and agreed to proceed.   HPI: Chelsey [name] presents for video visit  fu of med changes regarding bp  mounharo dosing and fluoxetine meds for ocd anx mood   Try increase the   fluoxetine 20 mg 2 x   per day . Can take 40 at the same time.  BP seems better   Take 1/2   losartan 25 mg per day ( total 12.5)  Ok to increase to  mounjaro 7.5 weekly .  Doing better with 20 mg prozac 2 x per day with dec checking and ocass dizziness getting better . Less compulsion  Mounjaro : lost a pound no sig se   Hb ocass  takes tums. Pleased with med  weight without clothes 160.4  BG 103 rangdom no known lows. Bp in 125-139 range doing better   ROS: See pertinent positives and negatives per HPI.  Past Medical History:  Diagnosis Date   Allergy    Aphthous ulcer of mouth 2010   related to chemotherapy    Breast cancer [facility]) 2010   2010 & 2011   Carpal tunnel syndrome of right wrist    Colitis    Colon polyp    Diabetes mellitus    Diverticulosis    GERD (gastroesophageal reflux disease)    Hyperlipidemia    Hypertension    Lymphedema 08/31/2013   left arm   Osteoarthritis    Pneumonia 07/28/2011   Sinusitis    Sleep apnea    CPAP-setting of 10   Squamous cell carcinoma    left leg    Past Surgical History:  Procedure Laterality Date   AUGMENTATION MAMMAPLASTY Left    CESAREAN SECTION     x 3   COLONOSCOPY  12/02/2018   Dr.Beavers   MASTECTOMY Left 11/23   2010   RECONSTRUCTION BREAST IMMEDIATE / DELAYED W/ TISSUE EXPANDER Left     REDUCTION MAMMAPLASTY Right    SKIN GRAFT     SQUAMOUS CELL CARCINOMA EXCISION Left    Left lower extremity   TONSILLECTOMY     as child   TOTAL KNEE ARTHROPLASTY Left 12/20/2012   Procedure: TOTAL KNEE ARTHROPLASTY;  Surgeon: Loanne Drilling, MD;  Location: [facility];  Service: Orthopedics;  Laterality: Left;   TOTAL KNEE ARTHROPLASTY Right 09/12/2013   Procedure: RIGHT TOTAL KNEE ARTHROPLASTY;  Surgeon: Loanne Drilling, MD;  Location: [facility];  Service: Orthopedics;  Laterality: Right;   TUBAL LIGATION      Family History  Problem Relation Age of Onset   Alzheimer's disease Mother    Sleep apnea Mother    Diabetes Mother    Hypertension Mother    Hyperlipidemia Mother    Breast cancer Mother 20       triple negative   Anxiety disorder Daughter    Hypertension Son        pulmonary   Congenital heart disease Son 0       TGA  died age 66    Colon cancer Neg Hx    Colon polyps Neg Hx  Esophageal cancer Neg Hx    Rectal cancer Neg Hx    Stomach cancer Neg Hx     Social History   Tobacco Use   Smoking status: Never   Smokeless tobacco: Never  Vaping Use   Vaping status: Never Used  Substance Use Topics   Alcohol use: Yes    Alcohol/week: 7.0 - 14.0 standard drinks of alcohol    Types: 7 - 14 Glasses of wine per week    Comment: 1-2 glasses every other night   Drug use: No      Current Outpatient Medications:    ALPRAZolam (XANAX) 0.25 MG tablet, TAKE 1 TABLET(0.25 MG) BY MOUTH TWICE DAILY AS NEEDED FOR ANXIETY. AVOID REGULAR USE, Disp: 30 tablet, Rfl: 0   azelastine (ASTELIN) 0.1 % nasal spray, INSTILL 1 TO 2 SPRAYS INTO EACH NOSTRIL TWICE DAILY AS NEEDED, Disp: 30 mL, Rfl: 12   calcium citrate (CALCITRATE - DOSED IN MG ELEMENTAL CALCIUM) 950 (200 Ca) MG tablet, Take 1 tablet by mouth. Every other day, Disp: , Rfl:    cyanocobalamin 1000 MCG tablet, Take 1 tablet by mouth daily., Disp: , Rfl:    esomeprazole (NEXIUM) 40 MG capsule, Take 40 mg by mouth as needed.  Takes bedtime, Disp: , Rfl:    FLUoxetine (PROZAC) 20 MG capsule, Take 1 capsule (20 mg total) by mouth 2 (two) times daily. Dosage change, Disp: 60 capsule, Rfl: 3   metFORMIN (GLUCOPHAGE-XR) 500 MG 24 hr tablet, TAKE 2 TABLETS BY MOUTH TWICE DAILY WITH MEALS (Patient taking differently: Take 500 mg by mouth daily.), Disp: 360 tablet, Rfl: 0   rosuvastatin (CRESTOR) 10 MG tablet, Take 1 tablet (10 mg total) by mouth every other day., Disp: 90 tablet, Rfl: 1   senna (SENOKOT) 8.6 MG tablet, Take 1 tablet by mouth as needed for constipation., Disp: , Rfl:    tirzepatide (MOUNJARO) 7.5 MG/0.5ML Pen, Inject 7.5 mg into the skin once a week. Dosage change, Disp: 6 mL, Rfl: 3   traZODone (DESYREL) 50 MG tablet, TAKE 1/2 TO 1 TABLET(25 TO 50 MG) BY MOUTH AT BEDTIME AS NEEDED FOR SLEEP, Disp: 30 tablet, Rfl: 3   losartan (COZAAR) 25 MG tablet, Take 0.5 tablets (12.5 mg total) by mouth daily. Dosage change, Disp: 45 tablet, Rfl: 1  Current Facility-Administered Medications:    0.9 %  sodium chloride infusion, 500 mL, Intravenous, Once, Tressia Danas, MD  EXAM: BP Readings from Last 3 Encounters:  05/13/23 126/64  03/31/23 102/65  03/11/23 (!) 110/52   Wt Readings from Last 3 Encounters:  06/10/23 165 lb (74.8 kg)  05/13/23 165 lb 12.8 oz (75.2 kg)  03/11/23 172 lb 3.2 oz (78.1 kg)     VITALS per patient if applicable:  GENERAL: alert, oriented, appears well and in no acute distress  HEENT: atraumatic, conjunttiva clear, no obvious abnormalities on inspection of external nose and ears  NECK: normal movements of the head and neck  LUNGS: on inspection no signs of respiratory distress, breathing rate appears normal, no obvious gross SOB, gasping or wheezing  CV: no obvious cyanosis  PSYCH/NEURO: pleasant and cooperative, no obvious depression or anxiety, speech and thought processing grossly intact Lab Results  Component Value Date   WBC 5.9 03/11/2023   HGB 13.7 03/11/2023   HCT  41.4 03/11/2023   PLT 214.0 03/11/2023   GLUCOSE 97 03/11/2023   CHOL 161 03/11/2023   TRIG 105.0 03/11/2023   HDL 62.20 03/11/2023   LDLDIRECT 173.9 11/09/2013  LDLCALC 78 03/11/2023   ALT 10 03/11/2023   AST 15 03/11/2023   NA 140 03/11/2023   K 4.2 03/11/2023   CL 103 03/11/2023   CREATININE 0.72 03/11/2023   BUN 13 03/11/2023   CO2 28 03/11/2023   TSH 1.69 03/11/2023   INR 1.0 06/03/2021   HGBA1C 6.1 03/11/2023   MICROALBUR 1.0 03/11/2023    ASSESSMENT AND PLAN:  Discussed the following assessment and plan:      ICD-10-CM   1. Type 2 diabetes mellitus with hyperglycemia, without long-term current use of insulin (HCC)  E11.65     2. Essential hypertension  I10     3. Medication management  Z79.899     4. Orthostatic dizziness  R42      Doing better   continue plan   same med and fu in about 2 months  Refilled losartan today 12.5 per day  Counseled.   Expectant management and discussion of plan and treatment with opportunity to ask questions and all were answered. The patient agreed with the plan and demonstrated an understanding of the instructions.   Advised to call back or seek an in-person evaluation if worsening  or having  further concerns  in interim. Return in about 2 months (around 08/10/2023) for medication etc .  Berniece Andreas, MD

## 2023-06-16 ENCOUNTER — Other Ambulatory Visit: Payer: Self-pay | Admitting: Internal Medicine

## 2023-06-30 ENCOUNTER — Ambulatory Visit: Payer: Medicare Other | Admitting: Internal Medicine

## 2023-06-30 ENCOUNTER — Encounter: Payer: Self-pay | Admitting: Internal Medicine

## 2023-06-30 VITALS — BP 110/70 | HR 59 | Temp 97.4°F | Ht 63.0 in | Wt 163.4 lb

## 2023-06-30 DIAGNOSIS — G4733 Obstructive sleep apnea (adult) (pediatric): Secondary | ICD-10-CM | POA: Diagnosis not present

## 2023-06-30 DIAGNOSIS — R918 Other nonspecific abnormal finding of lung field: Secondary | ICD-10-CM | POA: Diagnosis not present

## 2023-06-30 DIAGNOSIS — J310 Chronic rhinitis: Secondary | ICD-10-CM | POA: Diagnosis not present

## 2023-06-30 MED ORDER — AZITHROMYCIN 250 MG PO TABS: ORAL_TABLET | ORAL | 0 refills | Status: DC

## 2023-06-30 MED ORDER — AZELASTINE HCL 0.1 % NA SOLN: NASAL | 12 refills | Status: AC

## 2023-06-30 NOTE — Patient Instructions (Addendum)
Script sent for Zpak  We can continue CPAP auto 5-15  Ok to keep appointment in March

## 2023-06-30 NOTE — Assessment & Plan Note (Signed)
Exacerbation of rhinosinusitis Plan-Z-Pak.

## 2023-06-30 NOTE — Assessment & Plan Note (Signed)
Continues follow-up with [facility]

## 2023-06-30 NOTE — Assessment & Plan Note (Signed)
Continues to do well with CPAP and will continue auto 5-15.  Continues to benefit.

## 2023-06-30 NOTE — Progress Notes (Signed)
HPI female never smoker here with husband. History OSA/CPAP, complicated by history breast cancer, HBP, allergic rhinitis, ITP,  NPSG 05/06/19 AHI 47.7/hr,  CT report from [facility] 12/04/2020- Stable small nodules and radiation changes.  No cancer recurrence or mets. -----------------------------------------------------------------------------------------------------   [birth year]-  77 year old female never smoker followed for history OSA/CPAP, Lung Nodule, complicated by  Breast Cancer L, HTN, allergic Rhinitis, ITP, DM 2, GERD,  -Trazodone 50  CPAP auto 5-15/ [employer] Download- compliance 97%, AHI 3.3/ hr Body weight today-179 lbs Covid vax-3 Phizer, 1 Moderna -----Patient thinks she has a sinus infection-a lot of drainage, nasal stuffiness, and occ dizziness  She often has nasal congestion and has been prone in the past to recurrent sinus infections.  Currently feels some retro-orbital pressure and postnasal drip.  She does not think she has an active infection now but is encouraged to call for antibiotic if it gets worse.  I offered referral to ENT and she declined.  We discussed use of nasal saline rinse. History of lung nodule.  Low-dose screening CT being done through her oncology office at [facility] in [place]. CPAP download reviewed.  She is comfortable and continues doing well.  06/30/23- 77 year old female never smoker followed for history OSA/CPAP, Lung Nodule, complicated by  Breast Cancer L, HTN, allergic Rhinitis, ITP, DM 2, GERD,  -Trazodone 50  CPAP auto 5-15/ [employer] Download- compliance 100%, AHI 1.1/hr Body weight today-163 lbs Cough,sinus pressure x4-5 days. Taking cleocin from dentist 150 mg x 4 days. Onset 5 days ago of malaise with sinus pressure, slight green, postnasal drip, morning cough. Has taken 4 days of left-over/ near expiration date cleocin. Oncologist wasn't sure if covid in past had something to do with ITP then, so she is unsure about vaccination. Pending a new  oncologist at [facility] to continue following lung nodule with hx breast cancer.  ROS-see HPI     + = positive Constitutional:   No-   weight loss, night sweats, + fevers, chills, fatigue, lassitude. HEENT:   + headaches, difficulty swallowing, tooth/dental problems, sore throat,       No-  sneezing, +itching, ear ache, +nasal congestion, +post nasal drip,  CV:  No-   chest pain, orthopnea, PND, swelling in lower extremities, anasarca, dizziness, palpitations Resp: No-   shortness of breath with exertion or at rest.            productive cough,  No non-productive cough,  No- coughing up of blood.           change in color of mucus.  No- wheezing.   Skin: No-   rash or lesions. GI:  No-   heartburn, indigestion, abdominal pain, nausea, vomiting,  GU:  MS:  + joint pain or swelling.   Neuro-     nothing unusual Psych:  No- change in mood or affect. No depression or anxiety.  No memory loss.  OBJ- Physical Exam    General- Alert, Oriented, Affect-appropriate, Distress- none acute, + overweight Skin- rash-none, lesions- none, excoriation- none Lymphadenopathy- none Head- atraumatic            Eyes- Gross vision intact, PERRLA, conjunctivae and secretions clear,  + mild proptosis            Ears- Hearing, canals-normal            Nose- + prominent turbinates, Septal dev +mild, no-mucus, polyps, erosion, perforation             Throat- Mallampati III-IV, mucosa clear , drainage-  none, tonsils- atrophic, own teeth Neck- flexible , trachea midline, no stridor , thyroid nl, carotid no bruit Chest - symmetrical excursion , unlabored           Heart/CV- RRR , no murmur , no gallop  , no rub, nl s1 s2                           - JVD- none , edema- none, stasis changes- none, varices- none           Lung- clear, wheeze- none, cough-none,   dullness-none, rub- none           Chest wall-  Abd-  Br/ Gen/ Rectal- Not done, not indicated Extrem- cyanosis- none, clubbing, none, atrophy- none, strength-  nl Neuro- grossly intact to observation

## 2023-08-05 ENCOUNTER — Other Ambulatory Visit: Payer: Self-pay | Admitting: Family

## 2023-08-10 ENCOUNTER — Other Ambulatory Visit: Payer: Self-pay

## 2023-08-10 MED ORDER — ROSUVASTATIN CALCIUM 10 MG PO TABS: 10.0000 mg | ORAL_TABLET | ORAL | 1 refills | Status: DC

## 2023-08-10 NOTE — Progress Notes (Unsigned)
No chief complaint on file.   HPI: Chelsey [name] 77 y.o. come in for Chronic disease management   Dm meds   Platelets :  hx of severe ITP and feels best to follow   but dced from hematology ROS: See pertinent positives and negatives per HPI.  Past Medical History:  Diagnosis Date   Allergy    Aphthous ulcer of mouth 2010   related to chemotherapy    Breast cancer [facility]) 2010   2010 & 2011   Carpal tunnel syndrome of right wrist    Colitis    Colon polyp    Diabetes mellitus    Diverticulosis    GERD (gastroesophageal reflux disease)    Hyperlipidemia    Hypertension    Lymphedema 08/31/2013   left arm   Osteoarthritis    Pneumonia 07/28/2011   Sinusitis    Sleep apnea    CPAP-setting of 10   Squamous cell carcinoma    left leg    Family History  Problem Relation Age of Onset   Alzheimer's disease Mother    Sleep apnea Mother    Diabetes Mother    Hypertension Mother    Hyperlipidemia Mother    Breast cancer Mother 48       triple negative   Anxiety disorder Daughter    Hypertension Son        pulmonary   Congenital heart disease Son 0       TGA  died age 73    Colon cancer Neg Hx    Colon polyps Neg Hx    Esophageal cancer Neg Hx    Rectal cancer Neg Hx    Stomach cancer Neg Hx     Social History   Socioeconomic History   Marital status: Married    Spouse name: Not on file   Number of children: 3   Years of education: Not on file   Highest education level: Not on file  Occupational History   Occupation: Engineer, drilling: [employer]   Occupation: Engineer, drilling: [employer]  Tobacco Use   Smoking status: Never   Smokeless tobacco: Never  Vaping Use   Vaping status: Never Used  Substance and Sexual Activity   Alcohol use: Yes    Alcohol/week: 7.0 - 14.0 standard drinks of alcohol    Types: 7 - 14 Glasses of wine per week    Comment: 1-2 glasses every other night   Drug use: No   Sexual activity: Yes    Birth  control/protection: Post-menopausal, Surgical  Other Topics Concern   Not on file  Social History Narrative   cb x 3   Realtor married    Bereaved  Parent   2 adult children   Mother with dementia and breast cancer   Passed away sept 74 15  Age 26   Social Determinants of Health   Financial Resource Strain: Low Risk  (03/11/2023)   Overall Financial Resource Strain (CARDIA)    Difficulty of Paying Living Expenses: Not hard at all  Food Insecurity: No Food Insecurity (03/11/2023)   Hunger Vital Sign    Worried About Running Out of Food in the Last Year: Never true    Ran Out of Food in the Last Year: Never true  Transportation Needs: No Transportation Needs (03/11/2023)   PRAPARE - Administrator, Civil Service (Medical): No    Lack of Transportation (Non-Medical): No  Physical Activity: Inactive (03/11/2023)  Exercise Vital Sign    Days of Exercise per Week: 0 days    Minutes of Exercise per Session: 0 min  Stress: Stress Concern Present (03/11/2023)   [employer] of Occupational Health - Occupational Stress Questionnaire    Feeling of Stress : Rather much  Social Connections: Moderately Integrated (03/11/2023)   Social Connection and Isolation Panel [NHANES]    Frequency of Communication with Friends and Family: More than three times a week    Frequency of Social Gatherings with Friends and Family: Once a week    Attends Religious Services: Never    Database administrator or Organizations: Yes    Attends Engineer, structural: More than 4 times per year    Marital Status: Married    Outpatient Medications Prior to Visit  Medication Sig Dispense Refill   ALPRAZolam (XANAX) 0.25 MG tablet TAKE 1 TABLET(0.25 MG) BY MOUTH TWICE DAILY AS NEEDED FOR ANXIETY. AVOID REGULAR USE 30 tablet 0   azelastine (ASTELIN) 0.1 % nasal spray INSTILL 1 TO 2 SPRAYS INTO EACH NOSTRIL TWICE DAILY AS NEEDED 30 mL 12   azithromycin (ZITHROMAX) 250 MG tablet 2 today then one  daily 6 tablet 0   calcium citrate (CALCITRATE - DOSED IN MG ELEMENTAL CALCIUM) 950 (200 Ca) MG tablet Take 1 tablet by mouth. Every other day     clindamycin (CLEOCIN) 150 MG capsule      cyanocobalamin 1000 MCG tablet Take 1 tablet by mouth daily.     esomeprazole (NEXIUM) 40 MG capsule Take 40 mg by mouth as needed. Takes bedtime     FLUoxetine (PROZAC) 20 MG capsule TAKE 1 CAPSULE (20 MG TOTAL) BY MOUTH 2 (TWO) TIMES DAILY. DOSAGE CHANGE 180 capsule 2   losartan (COZAAR) 25 MG tablet Take 0.5 tablets (12.5 mg total) by mouth daily. Dosage change 45 tablet 1   metFORMIN (GLUCOPHAGE-XR) 500 MG 24 hr tablet TAKE 2 TABLETS BY MOUTH TWICE DAILY WITH MEALS (Patient taking differently: Take 500 mg by mouth daily.) 360 tablet 0   rosuvastatin (CRESTOR) 10 MG tablet Take 1 tablet (10 mg total) by mouth every other day. 90 tablet 1   senna (SENOKOT) 8.6 MG tablet Take 1 tablet by mouth as needed for constipation.     tirzepatide (MOUNJARO) 7.5 MG/0.5ML Pen Inject 7.5 mg into the skin once a week. Dosage change 6 mL 3   traZODone (DESYREL) 50 MG tablet TAKE 1/2 TO 1 TABLET(25 TO 50 MG) BY MOUTH AT BEDTIME AS NEEDED FOR SLEEP 30 tablet 3   Facility-Administered Medications Prior to Visit  Medication Dose Route Frequency Provider Last Rate Last Admin   0.9 %  sodium chloride infusion  500 mL Intravenous Once Tressia Danas, MD         EXAM:  There were no vitals taken for this visit.  There is no height or weight on file to calculate BMI.  GENERAL: vitals reviewed and listed above, alert, oriented, appears well hydrated and in no acute distress HEENT: atraumatic, conjunctiva  clear, no obvious abnormalities on inspection of external nose and ears OP : no lesion edema or exudate  NECK: no obvious masses on inspection palpation  LUNGS: clear to auscultation bilaterally, no wheezes, rales or rhonchi, good air movement CV: HRRR, no clubbing cyanosis or  peripheral edema nl cap refill  MS: moves  all extremities without noticeable focal  abnormality PSYCH: pleasant and cooperative, no obvious depression or anxiety Lab Results  Component Value Date  WBC 5.9 03/11/2023   HGB 13.7 03/11/2023   HCT 41.4 03/11/2023   PLT 214.0 03/11/2023   GLUCOSE 97 03/11/2023   CHOL 161 03/11/2023   TRIG 105.0 03/11/2023   HDL 62.20 03/11/2023   LDLDIRECT 173.9 11/09/2013   LDLCALC 78 03/11/2023   ALT 10 03/11/2023   AST 15 03/11/2023   NA 140 03/11/2023   K 4.2 03/11/2023   CL 103 03/11/2023   CREATININE 0.72 03/11/2023   BUN 13 03/11/2023   CO2 28 03/11/2023   TSH 1.69 03/11/2023   INR 1.0 06/03/2021   HGBA1C 6.1 03/11/2023   MICROALBUR 1.0 03/11/2023   BP Readings from Last 3 Encounters:  06/30/23 110/70  05/13/23 126/64  03/31/23 102/65    ASSESSMENT AND PLAN:  Discussed the following assessment and plan:  Type 2 diabetes mellitus with hyperglycemia, without long-term current use of insulin (HCC)  Essential hypertension  Medication management  BMI 30.0-30.9,adult A1c and platelets  -Patient advised to return or notify health care team  if  new concerns arise.  There are no Patient Instructions on file for this visit.   Neta Mends. Jilliane Kazanjian M.D.

## 2023-08-11 ENCOUNTER — Ambulatory Visit (INDEPENDENT_AMBULATORY_CARE_PROVIDER_SITE_OTHER): Payer: Medicare Other | Admitting: Internal Medicine

## 2023-08-11 ENCOUNTER — Encounter: Payer: Self-pay | Admitting: Internal Medicine

## 2023-08-11 VITALS — BP 112/66 | HR 58 | Temp 97.7°F | Ht 63.0 in | Wt 159.0 lb

## 2023-08-11 DIAGNOSIS — Z6830 Body mass index (BMI) 30.0-30.9, adult: Secondary | ICD-10-CM | POA: Diagnosis not present

## 2023-08-11 DIAGNOSIS — I10 Essential (primary) hypertension: Secondary | ICD-10-CM | POA: Diagnosis not present

## 2023-08-11 DIAGNOSIS — Z862 Personal history of diseases of the blood and blood-forming organs and certain disorders involving the immune mechanism: Secondary | ICD-10-CM | POA: Diagnosis not present

## 2023-08-11 DIAGNOSIS — Z23 Encounter for immunization: Secondary | ICD-10-CM | POA: Diagnosis not present

## 2023-08-11 DIAGNOSIS — E1165 Type 2 diabetes mellitus with hyperglycemia: Secondary | ICD-10-CM | POA: Diagnosis not present

## 2023-08-11 DIAGNOSIS — Z853 Personal history of malignant neoplasm of breast: Secondary | ICD-10-CM | POA: Diagnosis not present

## 2023-08-11 DIAGNOSIS — Z79899 Other long term (current) drug therapy: Secondary | ICD-10-CM

## 2023-08-11 LAB — POCT GLYCOSYLATED HEMOGLOBIN (HGB A1C): Hemoglobin A1C: 5.5 % (ref 4.0–5.6)

## 2023-08-11 MED ORDER — AZITHROMYCIN 250 MG PO TABS: ORAL_TABLET | ORAL | 0 refills | Status: AC

## 2023-08-11 MED ORDER — LOSARTAN POTASSIUM 25 MG PO TABS: 12.5000 mg | ORAL_TABLET | Freq: Every day | ORAL | 1 refills | Status: DC

## 2023-08-11 NOTE — Patient Instructions (Addendum)
Good to see  you   today .  Bp is good  Ok to get  covid and flu before travel.   Will order z pack if needed  for travel ( sinusitis).   Contact [place] before next refill of  mounjaro about dosing.  Make sure  adding resistance exercise to keep muscle mass  .  No change in meds at this time.

## 2023-08-14 DIAGNOSIS — C50412 Malignant neoplasm of upper-outer quadrant of left female breast: Secondary | ICD-10-CM | POA: Diagnosis not present

## 2023-08-20 DIAGNOSIS — Z9011 Acquired absence of right breast and nipple: Secondary | ICD-10-CM | POA: Diagnosis not present

## 2023-08-20 DIAGNOSIS — C50912 Malignant neoplasm of unspecified site of left female breast: Secondary | ICD-10-CM | POA: Diagnosis not present

## 2023-09-08 DIAGNOSIS — C50912 Malignant neoplasm of unspecified site of left female breast: Secondary | ICD-10-CM | POA: Diagnosis not present

## 2023-09-24 DIAGNOSIS — C50912 Malignant neoplasm of unspecified site of left female breast: Secondary | ICD-10-CM | POA: Diagnosis not present

## 2023-09-25 DIAGNOSIS — Z853 Personal history of malignant neoplasm of breast: Secondary | ICD-10-CM | POA: Diagnosis not present

## 2023-09-25 DIAGNOSIS — C50412 Malignant neoplasm of upper-outer quadrant of left female breast: Secondary | ICD-10-CM | POA: Diagnosis not present

## 2023-09-25 DIAGNOSIS — Z803 Family history of malignant neoplasm of breast: Secondary | ICD-10-CM | POA: Diagnosis not present

## 2023-10-02 ENCOUNTER — Other Ambulatory Visit: Payer: Self-pay | Admitting: Internal Medicine

## 2023-11-06 ENCOUNTER — Other Ambulatory Visit: Payer: Self-pay | Admitting: Family

## 2023-11-10 NOTE — Progress Notes (Unsigned)
No chief complaint on file.   HPI: Chelsey [name] 78 y.o. come in for Chronic disease management  ROS: See pertinent positives and negatives per HPI.  Past Medical History:  Diagnosis Date   Allergy    Aphthous ulcer of mouth 2010   related to chemotherapy    Breast cancer [facility]) 2010   2010 & 2011   Carpal tunnel syndrome of right wrist    Colitis    Colon polyp    Diabetes mellitus    Diverticulosis    GERD (gastroesophageal reflux disease)    Hyperlipidemia    Hypertension    Lymphedema 08/31/2013   left arm   Osteoarthritis    Pneumonia 07/28/2011   Sinusitis    Sleep apnea    CPAP-setting of 10   Squamous cell carcinoma    left leg    Family History  Problem Relation Age of Onset   Alzheimer's disease Mother    Sleep apnea Mother    Diabetes Mother    Hypertension Mother    Hyperlipidemia Mother    Breast cancer Mother 17       triple negative   Anxiety disorder Daughter    Hypertension Son        pulmonary   Congenital heart disease Son 0       TGA  died age 35    Colon cancer Neg Hx    Colon polyps Neg Hx    Esophageal cancer Neg Hx    Rectal cancer Neg Hx    Stomach cancer Neg Hx     Social History   Socioeconomic History   Marital status: Married    Spouse name: Not on file   Number of children: 3   Years of education: Not on file   Highest education level: Not on file  Occupational History   Occupation: Engineer, 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[employer]   Occupation: Engineer, drilling: [employer]  Tobacco Use   Smoking status: Never   Smokeless tobacco: Never  Vaping Use   Vaping status: Never Used  Substance and Sexual Activity   Alcohol use: Yes    Alcohol/week: 7.0 - 14.0 standard drinks of alcohol    Types: 7 - 14 Glasses of wine per week    Comment: 1-2 glasses every other night   Drug use: No   Sexual activity: Yes    Birth control/protection: Post-menopausal, Surgical  Other Topics Concern   Not on file  Social History Narrative    cb x 3   Realtor married    Bereaved  Parent   2 adult children   Mother with dementia and breast cancer   Passed away sept 30 15  Age 55   Social Drivers of Corporate investment banker Strain: Low Risk  (03/11/2023)   Overall Financial Resource Strain (CARDIA)    Difficulty of Paying Living Expenses: Not hard at all  Food Insecurity: No Food Insecurity (03/11/2023)   Hunger Vital Sign    Worried About Running Out of Food in the Last Year: Never true    Ran Out of Food in the Last Year: Never true  Transportation Needs: No Transportation Needs (03/11/2023)   PRAPARE - Administrator, Civil Service (Medical): No    Lack of Transportation (Non-Medical): No  Physical Activity: Inactive (03/11/2023)   Exercise Vital Sign    Days of Exercise per Week: 0 days    Minutes of Exercise per Session:  0 min  Stress: Stress Concern Present (03/11/2023)   [employer] of Occupational Health - Occupational Stress Questionnaire    Feeling of Stress : Rather much  Social Connections: Moderately Integrated (03/11/2023)   Social Connection and Isolation Panel [NHANES]    Frequency of Communication with Friends and Family: More than three times a week    Frequency of Social Gatherings with Friends and Family: Once a week    Attends Religious Services: Never    Database administrator or Organizations: Yes    Attends Engineer, structural: More than 4 times per year    Marital Status: Married    Outpatient Medications Prior to Visit  Medication Sig Dispense Refill   ALPRAZolam (XANAX) 0.25 MG tablet TAKE 1 TABLET(0.25 MG) BY MOUTH TWICE DAILY AS NEEDED FOR ANXIETY. AVOID REGULAR USE 30 tablet 0   azelastine (ASTELIN) 0.1 % nasal spray INSTILL 1 TO 2 SPRAYS INTO EACH NOSTRIL TWICE DAILY AS NEEDED 30 mL 12   calcium citrate (CALCITRATE - DOSED IN MG ELEMENTAL CALCIUM) 950 (200 Ca) MG tablet Take 1 tablet by mouth. Every other day     clindamycin (CLEOCIN) 150 MG capsule   (Patient not taking: Reported on 08/11/2023)     cyanocobalamin 1000 MCG tablet Take 1 tablet by mouth daily.     esomeprazole (NEXIUM) 40 MG capsule Take 40 mg by mouth as needed. Takes bedtime     FLUoxetine (PROZAC) 20 MG capsule TAKE 1 CAPSULE (20 MG TOTAL) BY MOUTH 2 (TWO) TIMES DAILY. DOSAGE CHANGE 180 capsule 2   losartan (COZAAR) 25 MG tablet Take 0.5 tablets (12.5 mg total) by mouth daily. Dosage change 45 tablet 1   metFORMIN (GLUCOPHAGE-XR) 500 MG 24 hr tablet TAKE 2 TABLETS BY MOUTH TWICE DAILY WITH MEALS (Patient taking differently: Take 500 mg by mouth daily.) 360 tablet 0   rosuvastatin (CRESTOR) 10 MG tablet Take 1 tablet (10 mg total) by mouth every other day. 90 tablet 1   senna (SENOKOT) 8.6 MG tablet Take 1 tablet by mouth as needed for constipation.     tirzepatide (MOUNJARO) 7.5 MG/0.5ML Pen Inject 7.5 mg into the skin once a week. Dosage change 6 mL 3   traZODone (DESYREL) 50 MG tablet TAKE 1/2 TO 1 TABLET(25 TO 50 MG) BY MOUTH AT BEDTIME AS NEEDED FOR SLEEP 30 tablet 3   Facility-Administered Medications Prior to Visit  Medication Dose Route Frequency Provider Last Rate Last Admin   0.9 %  sodium chloride infusion  500 mL Intravenous Once Tressia Danas, MD         EXAM:  There were no vitals taken for this visit.  There is no height or weight on file to calculate BMI. Wt Readings from Last 3 Encounters:  08/11/23 159 lb (72.1 kg)  06/30/23 163 lb 6.4 oz (74.1 kg)  06/10/23 165 lb (74.8 kg)    GENERAL: vitals reviewed and listed above, alert, oriented, appears well hydrated and in no acute distress HEENT: atraumatic, conjunctiva  clear, no obvious abnormalities on inspection of external nose and ears OP : no lesion edema or exudate  NECK: no obvious masses on inspection palpation  LUNGS: clear to auscultation bilaterally, no wheezes, rales or rhonchi, good air movement CV: HRRR, no clubbing cyanosis or  peripheral edema nl cap refill  MS: moves all  extremities without noticeable focal  abnormality PSYCH: pleasant and cooperative, no obvious depression or anxiety Lab Results  Component Value Date   WBC 5.9  03/11/2023   HGB 13.7 03/11/2023   HCT 41.4 03/11/2023   PLT 214.0 03/11/2023   GLUCOSE 97 03/11/2023   CHOL 161 03/11/2023   TRIG 105.0 03/11/2023   HDL 62.20 03/11/2023   LDLDIRECT 173.9 11/09/2013   LDLCALC 78 03/11/2023   ALT 10 03/11/2023   AST 15 03/11/2023   NA 140 03/11/2023   K 4.2 03/11/2023   CL 103 03/11/2023   CREATININE 0.72 03/11/2023   BUN 13 03/11/2023   CO2 28 03/11/2023   TSH 1.69 03/11/2023   INR 1.0 06/03/2021   HGBA1C 5.5 08/11/2023   MICROALBUR 1.0 03/11/2023   BP Readings from Last 3 Encounters:  08/11/23 112/66  06/30/23 110/70  05/13/23 126/64    ASSESSMENT AND PLAN:  Discussed the following assessment and plan:  Type 2 diabetes mellitus with hyperglycemia, without long-term current use of insulin (HCC)  Essential hypertension  Medication management  HX: breast cancer  -Patient advised to return or notify health care team  if  new concerns arise.  There are no Patient Instructions on file for this visit.   Neta Mends. Marieta Markov M.D.

## 2023-11-11 ENCOUNTER — Ambulatory Visit (INDEPENDENT_AMBULATORY_CARE_PROVIDER_SITE_OTHER): Payer: Medicare Other | Admitting: Internal Medicine

## 2023-11-11 ENCOUNTER — Other Ambulatory Visit: Payer: Self-pay | Admitting: Internal Medicine

## 2023-11-11 ENCOUNTER — Encounter: Payer: Self-pay | Admitting: Internal Medicine

## 2023-11-11 VITALS — BP 114/60 | HR 59 | Temp 97.7°F | Ht 63.0 in | Wt 156.2 lb

## 2023-11-11 DIAGNOSIS — Z853 Personal history of malignant neoplasm of breast: Secondary | ICD-10-CM

## 2023-11-11 DIAGNOSIS — Z7985 Long-term (current) use of injectable non-insulin antidiabetic drugs: Secondary | ICD-10-CM | POA: Diagnosis not present

## 2023-11-11 DIAGNOSIS — I10 Essential (primary) hypertension: Secondary | ICD-10-CM

## 2023-11-11 DIAGNOSIS — E1165 Type 2 diabetes mellitus with hyperglycemia: Secondary | ICD-10-CM

## 2023-11-11 DIAGNOSIS — Z79899 Other long term (current) drug therapy: Secondary | ICD-10-CM

## 2023-11-11 DIAGNOSIS — E785 Hyperlipidemia, unspecified: Secondary | ICD-10-CM

## 2023-11-11 DIAGNOSIS — Z862 Personal history of diseases of the blood and blood-forming organs and certain disorders involving the immune mechanism: Secondary | ICD-10-CM

## 2023-11-11 LAB — POCT GLYCOSYLATED HEMOGLOBIN (HGB A1C): Hemoglobin A1C: 5.5 % (ref 4.0–5.6)

## 2023-11-11 MED ORDER — LOSARTAN POTASSIUM 25 MG PO TABS: 12.5000 mg | ORAL_TABLET | Freq: Every day | ORAL | 1 refills | Status: DC

## 2023-11-11 MED ORDER — ROSUVASTATIN CALCIUM 10 MG PO TABS: 10.0000 mg | ORAL_TABLET | ORAL | 1 refills | Status: DC

## 2023-11-11 NOTE — Patient Instructions (Addendum)
Good to see you  Have a good trip. If recurring  problem we may decrease   dose of mounjaro   Check on immunization  requirements   and travel   Future orders  for May June  for cholesterol and diabetes monitoring. And then cpe

## 2023-11-25 ENCOUNTER — Telehealth: Payer: Self-pay | Admitting: Internal Medicine

## 2023-11-25 DIAGNOSIS — G4733 Obstructive sleep apnea (adult) (pediatric): Secondary | ICD-10-CM

## 2023-11-25 NOTE — Telephone Encounter (Signed)
PT called [employer] for her and her husbands  CPAP supplies. They told her they are "not doing that anymore" . They gave her another number ( she no longer has that #) and when they called that number that company said they "could not find them in the system". I asked them if it was because [facility] did not take their ins anymore and she said no. She was last seen 06/30/23 by Dr. Maple Hudson.  [phone] is her #  Husband is: KELLEEN STOLZE 09/13/1946

## 2023-11-25 NOTE — Telephone Encounter (Signed)
Patient needs order for CPAP supplies sent to [facility]. Please advise

## 2023-11-26 NOTE — Telephone Encounter (Signed)
Order sent. Pt informed. NFN

## 2023-11-26 NOTE — Telephone Encounter (Signed)
Called [facility] and they stated patient is through [place] there phone number is [phone] but we will need an order

## 2023-12-28 ENCOUNTER — Telehealth: Payer: Self-pay | Admitting: Internal Medicine

## 2023-12-28 NOTE — Telephone Encounter (Signed)
 Patient will be going to [place] and needs a zpak to take with her.    Pharmacy: [facility] at [address]

## 2023-12-29 DIAGNOSIS — H40013 Open angle with borderline findings, low risk, bilateral: Secondary | ICD-10-CM | POA: Diagnosis not present

## 2023-12-29 DIAGNOSIS — Z961 Presence of intraocular lens: Secondary | ICD-10-CM | POA: Diagnosis not present

## 2023-12-29 DIAGNOSIS — H04123 Dry eye syndrome of bilateral lacrimal glands: Secondary | ICD-10-CM | POA: Diagnosis not present

## 2023-12-29 DIAGNOSIS — H10413 Chronic giant papillary conjunctivitis, bilateral: Secondary | ICD-10-CM | POA: Diagnosis not present

## 2023-12-29 NOTE — Telephone Encounter (Signed)
 Called patient.  Patient is going to [place].  Patient states Dr. Maple Hudson informed her he would  give her a Zpak to have on hand in case she gets sick while over seas.  Dr. Maple Hudson, please advise.  Current Outpatient Medications on File Prior to Visit  Medication Sig Dispense Refill   ALPRAZolam (XANAX) 0.25 MG tablet TAKE 1 TABLET(0.25 MG) BY MOUTH TWICE DAILY AS NEEDED FOR ANXIETY. AVOID REGULAR USE 30 tablet 0   azelastine (ASTELIN) 0.1 % nasal spray INSTILL 1 TO 2 SPRAYS INTO EACH NOSTRIL TWICE DAILY AS NEEDED 30 mL 12   calcium citrate (CALCITRATE - DOSED IN MG ELEMENTAL CALCIUM) 950 (200 Ca) MG tablet Take 1 tablet by mouth. Every other day     clindamycin (CLEOCIN) 150 MG capsule      cyanocobalamin 1000 MCG tablet Take 1 tablet by mouth daily.     esomeprazole (NEXIUM) 40 MG capsule Take 40 mg by mouth as needed. Takes bedtime     FLUoxetine (PROZAC) 20 MG capsule TAKE 1 CAPSULE (20 MG TOTAL) BY MOUTH 2 (TWO) TIMES DAILY. DOSAGE CHANGE 180 capsule 2   losartan (COZAAR) 25 MG tablet Take 0.5 tablets (12.5 mg total) by mouth daily. 45 tablet 1   metFORMIN (GLUCOPHAGE-XR) 500 MG 24 hr tablet TAKE 2 TABLETS BY MOUTH TWICE DAILY WITH MEALS (Patient taking differently: Take 500 mg by mouth daily.) 360 tablet 0   rosuvastatin (CRESTOR) 10 MG tablet Take 1 tablet (10 mg total) by mouth every other day. 90 tablet 1   senna (SENOKOT) 8.6 MG tablet Take 1 tablet by mouth as needed for constipation.     tirzepatide (MOUNJARO) 7.5 MG/0.5ML Pen Inject 7.5 mg into the skin once a week. Dosage change 6 mL 3   traZODone (DESYREL) 50 MG tablet TAKE 1/2 TO 1 TABLET(25 TO 50 MG) BY MOUTH AT BEDTIME AS NEEDED FOR SLEEP 30 tablet 3   Current Facility-Administered Medications on File Prior to Visit  Medication Dose Route Frequency Provider Last Rate Last Admin   0.9 %  sodium chloride infusion  500 mL Intravenous Once Tressia Danas, MD        Allergies  Allergen Reactions   Ambien [Zolpidem Tartrate]      Felt bad hard to function   Amoxicillin     REACTION: unspecified   Bactrim [Sulfamethoxazole-Trimethoprim] Other (See Comments)    Lower platelets    Farxiga [Dapagliflozin] Other (See Comments)    Sever yeast infection and after had severe ITP    Levaquin [Levofloxacin] Other (See Comments)    Myalgias   Lipitor [Atorvastatin Calcium]     Leg cramps   Oxycodone    Penicillins     As child   Prednisone Itching   Latex Rash   Mupirocin Rash   Other Rash    use PAPER tape

## 2023-12-30 MED ORDER — AZITHROMYCIN 250 MG PO TABS: ORAL_TABLET | ORAL | 0 refills | Status: DC

## 2023-12-30 NOTE — Telephone Encounter (Signed)
 Zpak script sent

## 2023-12-31 NOTE — Telephone Encounter (Signed)
 Left message on VM that Zpak was sent in per Dr. Maple Hudson.

## 2024-01-04 ENCOUNTER — Ambulatory Visit: Payer: Medicare Other | Admitting: Internal Medicine

## 2024-01-07 DIAGNOSIS — Z421 Encounter for breast reconstruction following mastectomy: Secondary | ICD-10-CM | POA: Diagnosis not present

## 2024-01-11 DIAGNOSIS — Z09 Encounter for follow-up examination after completed treatment for conditions other than malignant neoplasm: Secondary | ICD-10-CM | POA: Diagnosis not present

## 2024-01-11 DIAGNOSIS — Z9889 Other specified postprocedural states: Secondary | ICD-10-CM | POA: Diagnosis not present

## 2024-01-11 DIAGNOSIS — Z9882 Breast implant status: Secondary | ICD-10-CM | POA: Diagnosis not present

## 2024-01-13 ENCOUNTER — Telehealth: Payer: Self-pay | Admitting: Internal Medicine

## 2024-01-13 NOTE — Telephone Encounter (Signed)
 Copied from CRM [id number]. Topic: Clinical - Medication Question >> [name] 13, 2024  3:59 PM [place] E wrote: Reason for CRM: Patient is headed to [place] on April 14th and questioning if she can get "Malaria pills" and anti-diarrheal medication just in case she has stomach issues while travelling.

## 2024-01-17 NOTE — Telephone Encounter (Signed)
 Need more specific information when someone asks for malaria prophylaxis how many days of travel   and  most specific location ( country  not just continent )   Meds Usually begin  days before traveling and continue after return     we can order daily doxycycline but need to know above dates   Also assuming asking for  travelers diarrhea  ( usually use azithromycin ) and she should take immodium with her also . Since she is leaving in  a week  please get this infor asap.  So we can send in.

## 2024-01-18 NOTE — Telephone Encounter (Signed)
 Reach out to pt. And went over provider's message.   Pt reports she will be leaving on April 14 and come back on April 28. She continues she will be going to [place]- area: [place], [place], [place].   Inform pt this message will be forward to provider then will update pt.

## 2024-01-19 MED ORDER — DOXYCYCLINE HYCLATE 100 MG PO TABS: ORAL_TABLET | ORAL | 0 refills | Status: DC

## 2024-01-19 MED ORDER — AZITHROMYCIN 500 MG PO TABS: 500.0000 mg | ORAL_TABLET | Freq: Every day | ORAL | 0 refills | Status: AC

## 2024-01-19 NOTE — Addendum Note (Signed)
 Addended byMadelin Headings on: 01/19/2024 07:21 PM   Modules accepted: Orders

## 2024-01-23 ENCOUNTER — Other Ambulatory Visit: Payer: Self-pay | Admitting: Internal Medicine

## 2024-01-23 MED ORDER — DOXYCYCLINE HYCLATE 100 MG PO TABS: ORAL_TABLET | ORAL | 0 refills | Status: DC

## 2024-01-23 MED ORDER — AZITHROMYCIN 500 MG PO TABS: 500.0000 mg | ORAL_TABLET | Freq: Every day | ORAL | 0 refills | Status: DC

## 2024-01-23 NOTE — Progress Notes (Signed)
 Message on call    never got the malaria prevention med    Record reviw shows that it  [facility]  Sent in 2 meds to [facility] [address]

## 2024-01-25 ENCOUNTER — Telehealth: Payer: Self-pay

## 2024-01-25 NOTE — Telephone Encounter (Signed)
 Contacted pt to follow up on Rx for traveling- doxycycline and azithromycin.   Pt reports she got both prescription yesterday. No further action is needed.

## 2024-02-02 ENCOUNTER — Other Ambulatory Visit: Payer: Self-pay | Admitting: Family

## 2024-02-10 ENCOUNTER — Ambulatory Visit: Admitting: Primary Care

## 2024-02-10 ENCOUNTER — Encounter: Payer: Self-pay | Admitting: Primary Care

## 2024-02-10 ENCOUNTER — Other Ambulatory Visit: Payer: Self-pay

## 2024-02-10 ENCOUNTER — Ambulatory Visit (INDEPENDENT_AMBULATORY_CARE_PROVIDER_SITE_OTHER)

## 2024-02-10 VITALS — BP 124/72 | HR 61 | Temp 97.8°F | Ht 63.5 in | Wt 152.4 lb

## 2024-02-10 DIAGNOSIS — J209 Acute bronchitis, unspecified: Secondary | ICD-10-CM

## 2024-02-10 DIAGNOSIS — R0981 Nasal congestion: Secondary | ICD-10-CM

## 2024-02-10 DIAGNOSIS — R918 Other nonspecific abnormal finding of lung field: Secondary | ICD-10-CM | POA: Diagnosis not present

## 2024-02-10 DIAGNOSIS — I700 Atherosclerosis of aorta: Secondary | ICD-10-CM | POA: Diagnosis not present

## 2024-02-10 DIAGNOSIS — R0982 Postnasal drip: Secondary | ICD-10-CM | POA: Diagnosis not present

## 2024-02-10 MED ORDER — PROMETHAZINE-DM 6.25-15 MG/5ML PO SYRP: 5.0000 mL | ORAL_SOLUTION | Freq: Four times a day (QID) | ORAL | 0 refills | Status: DC | PRN

## 2024-02-10 MED ORDER — GUAIFENESIN ER 600 MG PO TB12: 600.0000 mg | ORAL_TABLET | Freq: Two times a day (BID) | ORAL | 0 refills | Status: DC | PRN

## 2024-02-10 MED ORDER — ALBUTEROL SULFATE HFA 108 (90 BASE) MCG/ACT IN AERS
2.0000 | INHALATION_SPRAY | Freq: Four times a day (QID) | RESPIRATORY_TRACT | 2 refills | Status: AC | PRN
Start: 2024-02-10 — End: ?

## 2024-02-10 NOTE — Progress Notes (Signed)
 Called patient, no answer. CXR showed possible infiltrate/pneumonia left lung base. Please ask if she can take levaquin, it is on her allergy list sayings it causes muscle soreness.

## 2024-02-10 NOTE — Patient Instructions (Addendum)
 -  ACUTE BRONCHITIS: Acute bronchitis is an inflammation of the bronchial tubes in the lungs, often causing coughing and congestion. We will order a chest x-ray to check for pneumonia or bronchitis. You are prescribed an albuterol inhaler to use every six hours as needed for breathing difficulties, Mucinex for chest congestion, and a cough medication with an antihistamine and cough suppressant. Continue taking doxycycline  for malaria prevention and potential bronchitis coverage. Discontinue Advil Sinus and use Tylenol  for headaches. Apply warm compresses for sinus pressure. Follow up if symptoms do not improve in 3-5 days.  -SINUS CONGESTION: Sinus congestion is the blockage of the nasal passages usually due to inflammation. Continue using Astelin  nasal spray and apply warm compresses for sinus pressure. You can also use Tylenol  for headaches. Follow up if symptoms do not improve in 3-5 days.   -PNEUMONIA: Pneumonia is an infection that inflames the air sacs in one or both lungs. We are concerned about a potential recurrence due to your symptoms of wheezing and congestion. A chest x-ray will help rule out pneumonia.  Orders: CXR today   INSTRUCTIONS: Please get a chest x-ray to assess for pneumonia or bronchitis. Follow up if your symptoms do not improve in 3-5 days.

## 2024-02-10 NOTE — Telephone Encounter (Signed)
 Copied from CRM [id number]. Topic: Clinical - Lab/Test Results >> Feb 10, 2024  2:11 PM Justina Oman C wrote: Reason for CRM: Patient [phone] is returning the office call on xray results. Please call back.  This is sent to me via result note. Completing CRM as I do not need a duplicate.

## 2024-02-10 NOTE — Progress Notes (Signed)
 @Patient  ID: Chelsey [name], female    DOB: 12-02-1945, 78 y.o.   MRN: [id number]  No chief complaint on file.   Referring provider: Reginal Capra, MD  HPI: 78 year old female, never smoked. PMH significant for HTN, OSA, allergic rhinitis, lung nodule, type 2 diabetes, osteoarthritis, breast cancer, obesity. Patient of Dr. Linder Revere. Maintained on auto CPAP    02/10/2024 Discussed the use of AI scribe software for clinical note transcription with the patient, who gave verbal consent to proceed.  History of Present Illness   Chelsey [name] "Chelsey [name]" is a 78 year old female who presents with symptoms suggestive of a sinus infection.  She returned from a trip to [place] two days ago and began experiencing symptoms consistent with a sinus infection, including sinus congestion, rattling in her chest, drainage, and a persistent cough. These symptoms have persisted for about two weeks, with intermittent improvement after taking a Z-Pak (azithromycin ).  She has a history of sinus infections associated with travel and typically uses a Z-Pak for treatment. During this episode, she also experienced ear congestion, with one ear still feeling stopped up, and a sensation of pressure in her head. She feels 'rotten' and 'exhausted', with a need to take deep breaths.  She has been using over-the-counter medications, including Advil Sinus, which she purchased in [place], to manage her symptoms. She also uses Astelin  nasal spray regularly. She is currently taking doxycycline  for malaria prevention, which she started prior to her trip and continues to take.  No fever, hemoptysis, or chest pain. She reports fatigue and a history of pneumonia. Her taste is off and she describes feeling 'in a fog'. She has not experienced gastrointestinal issues during her trip.  She has a history of sleep apnea and uses a CPAP machine, which she found helpful during her recent illness. She has previously used an albuterol inhaler but does not  currently have one. She denies any current use of antihistamines like Zyrtec.       Allergies  Allergen Reactions   Ambien  [Zolpidem  Tartrate]     Felt bad hard to function   Amoxicillin     REACTION: unspecified   Bactrim  [Sulfamethoxazole -Trimethoprim ] Other (See Comments)    Lower platelets    Farxiga  [Dapagliflozin ] Other (See Comments)    Sever yeast infection and after had severe ITP    Levaquin [Levofloxacin] Other (See Comments)    Myalgias   Lipitor [Atorvastatin  Calcium ]     Leg cramps   Oxycodone     Penicillins     As child   Prednisone  Itching   Latex Rash   Mupirocin  Rash   Other Rash    use PAPER tape    Immunization History  Administered Date(s) Administered   Fluad Quad(high Dose 65+) 08/30/2019, 08/07/2020, 08/20/2022   Fluad Trivalent(High Dose 65+) 08/11/2023   Influenza, High Dose Seasonal PF 07/02/2015, 07/24/2016, 08/04/2017, 08/26/2018, 09/24/2021   Influenza,inj,Quad PF,6+ Mos 06/23/2013   Moderna Sars-Covid-2 Vaccination 12/25/2020   PFIZER(Purple Top)SARS-COV-2 Vaccination 11/19/2019, 12/13/2019, 07/05/2020   Pneumococcal Conjugate-13 11/24/2013   Td 10/03/2010   Tdap 10/03/2010   Unspecified SARS-COV-2 Vaccination 07/05/2020    Past Medical History:  Diagnosis Date   Allergy    Aphthous ulcer of mouth 2010   related to chemotherapy    Breast cancer [facility]) 2010   2010 & 2011   Carpal tunnel syndrome of right wrist    Colitis    Colon polyp    Diabetes mellitus    Diverticulosis  GERD (gastroesophageal reflux disease)    Hyperlipidemia    Hypertension    Lymphedema 08/31/2013   left arm   Osteoarthritis    Pneumonia 07/28/2011   Sinusitis    Sleep apnea    CPAP-setting of 10   Squamous cell carcinoma    left leg    Tobacco History: Social History   Tobacco Use  Smoking Status Never  Smokeless Tobacco Never   Counseling given: Not Answered   Outpatient Medications Prior to Visit  Medication Sig Dispense Refill    ALPRAZolam  (XANAX ) 0.25 MG tablet TAKE 1 TABLET(0.25 MG) BY MOUTH TWICE DAILY AS NEEDED FOR ANXIETY. AVOID REGULAR USE 30 tablet 0   azelastine  (ASTELIN ) 0.1 % nasal spray INSTILL 1 TO 2 SPRAYS INTO EACH NOSTRIL TWICE DAILY AS NEEDED 30 mL 12   azithromycin  (ZITHROMAX ) 500 MG tablet Take 1 tablet (500 mg total) by mouth daily. If needed for travelers diarrhea 3 tablet 0   calcium  citrate (CALCITRATE - DOSED IN MG ELEMENTAL CALCIUM ) 950 (200 Ca) MG tablet Take 1 tablet by mouth. Every other day     cyanocobalamin  1000 MCG tablet Take 1 tablet by mouth daily.     doxycycline  (VIBRA -TABS) 100 MG tablet 100 mg orally once daily beginning 1 to 2 days prior to travel, continued during travel, and for 4 weeks after leaving malarious area . 45 tablet 0   esomeprazole  (NEXIUM ) 40 MG capsule Take 40 mg by mouth as needed. Takes bedtime     FLUoxetine  (PROZAC ) 20 MG capsule TAKE 1 CAPSULE (20 MG TOTAL) BY MOUTH 2 (TWO) TIMES DAILY. DOSAGE CHANGE 180 capsule 2   losartan  (COZAAR ) 25 MG tablet Take 0.5 tablets (12.5 mg total) by mouth daily. 45 tablet 1   metFORMIN  (GLUCOPHAGE -XR) 500 MG 24 hr tablet TAKE 2 TABLETS BY MOUTH TWICE DAILY WITH MEALS (Patient taking differently: Take 500 mg by mouth daily.) 360 tablet 0   rosuvastatin  (CRESTOR ) 10 MG tablet Take 1 tablet (10 mg total) by mouth every other day. 90 tablet 1   senna (SENOKOT) 8.6 MG tablet Take 1 tablet by mouth as needed for constipation.     tirzepatide  (MOUNJARO ) 7.5 MG/0.5ML Pen Inject 7.5 mg into the skin once a week. Dosage change 6 mL 3   traZODone  (DESYREL ) 50 MG tablet TAKE 1/2 TO 1 TABLET(25 TO 50 MG) BY MOUTH AT BEDTIME AS NEEDED FOR SLEEP 30 tablet 3   Facility-Administered Medications Prior to Visit  Medication Dose Route Frequency Provider Last Rate Last Admin   0.9 %  sodium chloride  infusion  500 mL Intravenous Once Beavers, Kimberly, MD       Review of Systems  Review of Systems  Constitutional:  Positive for fatigue.  HENT:   Positive for congestion and postnasal drip.   Respiratory:  Positive for cough.   Cardiovascular: Negative.    Physical Exam  There were no vitals taken for this visit. Physical Exam Constitutional:      General: She is not in acute distress.    Appearance: Normal appearance. She is not ill-appearing.  HENT:     Head: Normocephalic and atraumatic.     Mouth/Throat:     Mouth: Mucous membranes are moist.     Pharynx: Oropharynx is clear.  Cardiovascular:     Rate and Rhythm: Normal rate and regular rhythm.  Pulmonary:     Effort: Pulmonary effort is normal.     Breath sounds: Normal breath sounds.  Musculoskeletal:  General: Normal range of motion.  Skin:    General: Skin is warm and dry.  Neurological:     General: No focal deficit present.     Mental Status: She is alert and oriented to person, place, and time. Mental status is at baseline.  Psychiatric:        Mood and Affect: Mood normal.        Behavior: Behavior normal.        Thought Content: Thought content normal.        Judgment: Judgment normal.      Lab Results:  CBC    Component Value Date/Time   WBC 5.9 03/11/2023 1136   RBC 4.57 03/11/2023 1136   HGB 13.7 03/11/2023 1136   HGB 12.9 03/30/2013 1020   HCT 41.4 03/11/2023 1136   HCT 37.4 03/30/2013 1020   PLT 214.0 03/11/2023 1136   PLT 206 03/30/2013 1020   MCV 90.5 03/11/2023 1136   MCV 83.9 03/30/2013 1020   MCH 30.5 09/14/2013 0457   MCHC 33.2 03/11/2023 1136   RDW 13.4 03/11/2023 1136   RDW 15.3 (H) 03/30/2013 1020   LYMPHSABS 2.1 03/11/2023 1136   LYMPHSABS 1.6 03/30/2013 1020   MONOABS 0.6 03/11/2023 1136   MONOABS 0.3 03/30/2013 1020   EOSABS 0.1 03/11/2023 1136   EOSABS 0.1 03/30/2013 1020   BASOSABS 0.0 03/11/2023 1136   BASOSABS 0.0 03/30/2013 1020    BMET    Component Value Date/Time   NA 140 03/11/2023 1136   NA 141 03/30/2013 1020   K 4.2 03/11/2023 1136   K 3.9 03/30/2013 1020   CL 103 03/11/2023 1136   CL 105  03/30/2013 1020   CO2 28 03/11/2023 1136   CO2 25 03/30/2013 1020   GLUCOSE 97 03/11/2023 1136   GLUCOSE 180 (H) 03/30/2013 1020   BUN 13 03/11/2023 1136   BUN 10.3 03/30/2013 1020   CREATININE 0.72 03/11/2023 1136   CREATININE 0.7 03/30/2013 1020   CALCIUM  10.0 03/11/2023 1136   CALCIUM  10.0 03/30/2013 1020   GFRNONAA >90 09/14/2013 0457   GFRAA >90 09/14/2013 0457    BNP No results found for: "BNP"  ProBNP No results found for: "PROBNP"  Imaging: No results found.   Assessment & Plan:   1. Acute bronchitis, unspecified organism (Primary) - DG Chest 2 View; Future  Assessment and Plan    Acute bronchitis Acute bronchitis with sinus congestion and cough persisting for two weeks post-travel. History of pneumonia. Current symptoms include fatigue, wheezing, and lung congestion. Currently taking doxycycline  for malaria prophylaxis. Prednisone  avoided due to rash history. Afebrile, VSS. Likely viral etiology.  - Order chest x-ray to assess for pneumonia  - Prescribe albuterol inhaler, use every six hours as needed for dyspnea, wheezing, or coughing fits - Prescribe Mucinex 600mg  twice daily for chest congestion - Prescribe promethazine -dm for cough  - Continue doxycycline  for malaria prophylaxis and potential bronchitis coverage - Discontinue Advil sinus - Recommend Tylenol  for headache - Advise warm compresses for sinus pressure - Follow up if symptoms do not improve in 3-5 days  Sinus congestion Sinus congestion with postnasal drip and pressure, likely exacerbated by travel and altitude changes. Symptoms include ear congestion and altered taste. Current management includes Astelin  nasal spray and OTC medications.  - Continue Astelin  nasal spray - Advise warm compresses for sinus pressure - Consider Tylenol  for headache - Follow up if symptoms do not improve in 3-5 days  Pneumonia Concern for potential pneumonia recurrence due to  wheezing and congestion. Chest x-ray  will help rule out pneumonia.   Antonio Baumgarten, NP 02/10/2024

## 2024-02-10 NOTE — Progress Notes (Signed)
 Due to allergy to PCN and Levaquin not may more options. Continue Doxycyline, make sure she is taking 100mg  twice daily. When did she take zpack?  Likely most benefit will be from adding albuterol and mucinex   Will need to repeat CXR in 4 weeks

## 2024-02-11 DIAGNOSIS — D485 Neoplasm of uncertain behavior of skin: Secondary | ICD-10-CM | POA: Diagnosis not present

## 2024-02-11 DIAGNOSIS — Z85828 Personal history of other malignant neoplasm of skin: Secondary | ICD-10-CM | POA: Diagnosis not present

## 2024-02-11 DIAGNOSIS — L2989 Other pruritus: Secondary | ICD-10-CM | POA: Diagnosis not present

## 2024-02-11 DIAGNOSIS — D2261 Melanocytic nevi of right upper limb, including shoulder: Secondary | ICD-10-CM | POA: Diagnosis not present

## 2024-02-11 DIAGNOSIS — L814 Other melanin hyperpigmentation: Secondary | ICD-10-CM | POA: Diagnosis not present

## 2024-02-11 DIAGNOSIS — D1801 Hemangioma of skin and subcutaneous tissue: Secondary | ICD-10-CM | POA: Diagnosis not present

## 2024-02-11 DIAGNOSIS — L821 Other seborrheic keratosis: Secondary | ICD-10-CM | POA: Diagnosis not present

## 2024-02-12 ENCOUNTER — Other Ambulatory Visit: Payer: Self-pay | Admitting: Internal Medicine

## 2024-02-12 ENCOUNTER — Encounter: Payer: Self-pay | Admitting: Internal Medicine

## 2024-02-12 MED ORDER — TRAZODONE HCL 50 MG PO TABS: ORAL_TABLET | ORAL | 3 refills | Status: DC

## 2024-02-12 NOTE — Telephone Encounter (Signed)
 Copied from CRM [id number]. Topic: Clinical - Medication Refill >> Feb 12, 2024 11:44 AM Howard Macho wrote: Most Recent Primary Care Visit:  Provider: Daphine Eagle K  Department: [facility]  Visit Type: OFFICE VISIT  Date: 11/11/2023  Medication: traZODone  (DESYREL ) 50 MG tablet  Has the patient contacted their pharmacy? Yes (Agent: If no, request that the patient contact the pharmacy for the refill. If patient does not wish to contact the pharmacy document the reason why and proceed with request.) (Agent: If yes, when and what did the pharmacy advise?)  Is this the correct pharmacy for this prescription? Yes If no, delete pharmacy and type the correct one.  This is the patient's preferred pharmacy:  [id number] - [place], [place] - [address] [place] [address] Phone: [phone] Fax: [phone]  Has the prescription been filled recently? Yes  Is the patient out of the medication? Yes  Has the patient been seen for an appointment in the last year OR does the patient have an upcoming appointment? Yes  Can we respond through MyChart? Yes  Agent: Please be advised that Rx refills may take up to 3 business days. We ask that you follow-up with your pharmacy.

## 2024-02-15 ENCOUNTER — Ambulatory Visit: Payer: Self-pay

## 2024-02-15 ENCOUNTER — Encounter: Payer: Self-pay | Admitting: Primary Care

## 2024-02-15 ENCOUNTER — Ambulatory Visit: Admitting: Primary Care

## 2024-02-15 DIAGNOSIS — J209 Acute bronchitis, unspecified: Secondary | ICD-10-CM | POA: Diagnosis not present

## 2024-02-15 MED ORDER — PREDNISONE 10 MG PO TABS: ORAL_TABLET | ORAL | 0 refills | Status: DC

## 2024-02-15 NOTE — Telephone Encounter (Signed)
**Note De-identified  [place] Obfuscation** Please advise 

## 2024-02-15 NOTE — Patient Instructions (Signed)
-  BRONCHITIS WITH POSSIBLE PNEUMONIA: You have chronic bronchitis with a possible pneumonia, which is an infection in the lungs. We will start you on prednisone  to help with the wheezing and congestion. Continue using Mucinex  and your cough suppressant, and use albuterol  as needed. Stop using the Astelin  nasal spray and switch to a saline nasal spray. If your symptoms do not improve by the end of the week, we may start you on an antibiotic called cefdinir . If you develop a rash, stop taking cefdinir  and take Benadryl .  INSTRUCTIONS: Please follow the medication plan as discussed. If you experience any side effects or a rash, discontinue the medication and notify us  immediately. If your symptoms do not improve by the end of the week, please contact us  for further evaluation.

## 2024-02-15 NOTE — Progress Notes (Signed)
 Virtual Visit via Video Note  I connected with Chelsey [name] on 02/15/24 at  4:00 PM EDT by a video enabled telemedicine application and verified that I am speaking with the correct person using two identifiers.  Location: Patient: Home Provider: Office    I discussed the limitations of evaluation and management by telemedicine and the availability of in person appointments. The patient expressed understanding and agreed to proceed.  History of Present Illness: 78 year old female, never smoked. PMH significant for HTN, OSA, allergic rhinitis, lung nodule, type 2 diabetes, osteoarthritis, breast cancer, obesity. Patient of Dr. Linder Revere. Maintained on auto CPAP.  02/15/2024 Discussed the use of AI scribe software for clinical note transcription with the patient, who gave verbal consent to proceed.  History of Present Illness   Chelsey [name] "[name]" is a 78 year old female who presents with persistent cough and wheezing.  She was previously seen on April 30th for bronchitis and has been on antibiotics, including a Z-Pak during her travel to [place] and doxycycline  for malaria prevention. Despite these treatments, she continues to experience a persistent cough and wheezing, particularly in the afternoon when taking deep breaths. The cough is described as 'hacky' and is accompanied by wheezing.  A chest x-ray showed a subtle opacity or possible infiltrate, along with chronic bronchitic changes. She has been using Mucinex , albuterol , and promethazine -dm, but the cough persists. Her sinuses are bleeding on the left side and her ears feel clogged.  She has a history of allergies to Levaquin and penicillin, with the latter causing a rash during childhood. She also had ITP three years ago and was treated with steroids at that time. She can take prednisone , which she associates with a rash in the past.  She is currently using a prescription nasal spray, Astelin , twice a day.       Observations/Objective:  Appears well without overt respiratory symptoms  Assessment and Plan:  1. Acute bronchitis, unspecified organism (Primary)  Assessment and Plan    Bronchitis with possible pneumonia Acute bronchitis with possible pneumonia indicated by subtle opacity or infiltrate on chest x-ray 4/30. Persistent cough with wheezing, especially in the afternoon. No colored mucus production. Previous treatment with Mucinex , albuterol , promethazine -dm has not resolved symptoms. Prednisone  considered due to wheezing and congestion. Discussion of potential use of cephalosporin antibiotics if symptoms persist, given penicillin allergy history without anaphylaxis.  - Prescribe prednisone  20 mg for 5 days, then taper to 10 mg for 5 days, and 5 mg for another 5 days. - Instruct to discontinue prednisone  and notify if any side effects or rash develop. - Continue Mucinex  and cough suppressant. - Use albuterol  as needed 2 puffs every 4-6 hours  - Discontinue Astelin  nasal spray and use saline nasal spray instead. - If no improvement by end of the week, consider prescribing cefdinir , a cephalosporin antibiotic. - Instruct to discontinue cefdinir  and take Benadryl  if rash develops.  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 Unclear history of penicillin allergy, possibly a rash as a child, but no known anaphylaxis. Discussion of potential cross-sensitivity with cephalosporins, but deemed acceptable to use if needed due to lack of anaphylaxis history.  Idiopathic thrombocytopenic purpura (ITP) history History of ITP, previously treated with steroids. She can tolerate prednisone  despite allergy listed    Follow Up Instructions:  -Notify office if cough not better in 3-5 days while taking prednisone    I discussed the assessment and treatment plan with the patient. The patient was provided  an opportunity to ask questions and all were  answered. The patient agreed with the plan and demonstrated an understanding of the instructions.   The patient was advised to call back or seek an in-person evaluation if the symptoms worsen or if the condition fails to improve as anticipated.  I provided 20 minutes of non-face-to-face time during this encounter.   Antonio Baumgarten, NP

## 2024-02-15 NOTE — Telephone Encounter (Signed)
 She has a visit with me at 4pm- virtual

## 2024-02-15 NOTE — Telephone Encounter (Signed)
 Pt reports she was seen last week but is not experiencing any improvement in symptoms, she continues to report a "chest rattle" with  minimally productive cough. Pt notes at recent OV she was advised she may need additional abx if not improving. OV scheduled today. Pt advised if abx can be called in she would like to cancel appt. This RN educated pt on home care, new-worsening symptoms, when to call back/seek emergent care. Pt verbalized understanding and agrees to plan.   E2C2 Pulmonary Triage - Initial Assessment Questions "Chief Complaint (e.g., cough, sob, wheezing, fever, chills, sweat or additional symptoms) *Go to specific symptom protocol after initial questions. "Chest rattle" bleeding sinuses  "How long have symptoms been present?" X 3 weeks  Have you tested for COVID or Flu? Note: If not, ask patient if a home test can be taken. If so, instruct patient to call back for positive results. NO  MEDICINES:   "Have you used any OTC meds to help with symptoms?" Yes If yes, ask "What medications?" Mucinex  for congestion/flu  "Have you used your inhalers/maintenance medication?" Yes If yes, "What medications?" Albuterol  "mostly in afternoon"  If inhaler, ask "How many puffs and how often?" Note: Review instructions on medication in the chart. Primarily PM  OXYGEN: "Do you wear supplemental oxygen?" No If yes, "How many liters are you supposed to use?" NA  "Do you monitor your oxygen levels?" No If yes, "What is your reading (oxygen level) today?" NA  "What is your usual oxygen saturation reading?"  (Note: Pulmonary O2 sats should be 90% or greater) NA   Copied from CRM #[id number]. Topic: Clinical - Red Word Triage >> Feb 15, 2024 10:07 AM Crist Dominion wrote: Red Word that prompted transfer to Nurse Triage: Bleeding sinus's and the feeling of "chest rattle" Reason for Disposition  [1] MILD difficulty breathing (e.g., minimal/no SOB at rest, SOB with walking, pulse <100) AND [2]  still present when not coughing  Answer Assessment - Initial Assessment Questions 1. ONSET: "When did the cough begin?"      X 3 weeks  3. SPUTUM: "Describe the color of your sputum" (none, dry cough; clear, white, yellow, green)     Yellow/clear 4. HEMOPTYSIS: "Are you coughing up any blood?" If so ask: "How much?" (flecks, streaks, tablespoons, etc.)     None 5. DIFFICULTY BREATHING: "Are you having difficulty breathing?" If Yes, ask: "How bad is it?" (e.g., mild, moderate, severe)    - MILD: No SOB at rest, mild SOB with walking, speaks normally in sentences, can lie down, no retractions, pulse < 100.    - MODERATE: SOB at rest, SOB with minimal exertion and prefers to sit, cannot lie down flat, speaks in phrases, mild retractions, audible wheezing, pulse 100-120.    - SEVERE: Very SOB at rest, speaks in single words, struggling to breathe, sitting hunched forward, retractions, pulse > 120      Mild, with exertion 6. FEVER: "Do you have a fever?" If Yes, ask: "What is your temperature, how was it measured, and when did it start?"     None 10. OTHER SYMPTOMS: "Do you have any other symptoms?" (e.g., runny nose, wheezing, chest pain)       Bloody mucous from nose 12. TRAVEL: "Have you traveled out of the country in the last month?" (e.g., travel history, exposures)       Pt recently returned from [place] about 2 weeks ago  Protocols used: Cough - Acute Productive-A-AH

## 2024-02-19 DIAGNOSIS — R918 Other nonspecific abnormal finding of lung field: Secondary | ICD-10-CM | POA: Diagnosis not present

## 2024-02-19 DIAGNOSIS — C50412 Malignant neoplasm of upper-outer quadrant of left female breast: Secondary | ICD-10-CM | POA: Diagnosis not present

## 2024-02-19 DIAGNOSIS — K8020 Calculus of gallbladder without cholecystitis without obstruction: Secondary | ICD-10-CM | POA: Diagnosis not present

## 2024-02-19 DIAGNOSIS — K5730 Diverticulosis of large intestine without perforation or abscess without bleeding: Secondary | ICD-10-CM | POA: Diagnosis not present

## 2024-02-25 ENCOUNTER — Ambulatory Visit: Admitting: Internal Medicine

## 2024-02-26 DIAGNOSIS — C50412 Malignant neoplasm of upper-outer quadrant of left female breast: Secondary | ICD-10-CM | POA: Diagnosis not present

## 2024-03-02 ENCOUNTER — Other Ambulatory Visit: Payer: Self-pay | Admitting: Family

## 2024-03-09 ENCOUNTER — Ambulatory Visit (INDEPENDENT_AMBULATORY_CARE_PROVIDER_SITE_OTHER)

## 2024-03-09 DIAGNOSIS — J209 Acute bronchitis, unspecified: Secondary | ICD-10-CM | POA: Diagnosis not present

## 2024-03-09 DIAGNOSIS — I700 Atherosclerosis of aorta: Secondary | ICD-10-CM | POA: Diagnosis not present

## 2024-03-09 DIAGNOSIS — K8020 Calculus of gallbladder without cholecystitis without obstruction: Secondary | ICD-10-CM | POA: Diagnosis not present

## 2024-03-09 NOTE — Progress Notes (Signed)
 HPI female never smoker here with husband. History OSA/CPAP, complicated by history breast cancer, HBP, allergic rhinitis, ITP,  NPSG 05/06/19 AHI 47.7/hr,  CT report from [facility] 12/04/2020- Stable small nodules and radiation changes.  No cancer recurrence or mets. -----------------------------------------------------------------------------------------------------   06/30/23- 78 year old female never smoker followed for history OSA/CPAP, Lung Nodule, complicated by  Breast Cancer L, HTN, allergic Rhinitis, ITP, DM 2, GERD,  -Trazodone  50  CPAP auto 5-15/ [employer] Download- compliance 100%, AHI 1.1/hr Body weight today-163 lbs Cough,sinus pressure x4-5 days. Taking cleocin from dentist 150 mg x 4 days. Onset 5 days ago of malaise with sinus pressure, slight green, postnasal drip, morning cough. Has taken 4 days of left-over/ near expiration date cleocin. Oncologist wasn't sure if covid in past had something to do with ITP then, so she is unsure about vaccination. Pending a new oncologist at [facility] to continue following lung nodule with hx breast cancer.  03/10/24- 78 year old female never smoker followed for history OSA/CPAP, Lung Nodule, complicated by  hx Breast Cancer L, HTN, allergic Rhinitis, ITP, DM 2, GERD,  -Trazodone  50  CPAP auto 5-15/ [employer] Download- compliance 97%, AHI 0.6/hr Body weight today-154 lbs [facility] following lung nodule w hx breast cancer. Televist 5/5[name] NP-  Had gone to [place]. acute bronchitis> Zpak then doxy 4/12, then prednisone . Considered cefdinir , noting hx pcn allergy, w instruction to dc and take benadryl  if rash. [facility] CT 02/19/24-No new suspicious mass, nodule, nodes, effusion or ascites.  CXR 03/09/24- my read-persistent shadowing/ infiltrate L lung ? XRT fibrosis, report pending    Discussed the use of AI scribe software for clinical note transcription with the patient, who gave verbal consent to proceed.  History of Present Illness   Chelsey [name]  [name] is a 78 year old female followed for OSA,  with chronic sinusitis who presents with a persistent cough and sinus issues. She is followed by [facility] after L Breast CA/ XRT/ reconstruction.  She experiences a persistent cough that began after a trip to [place], where she developed a sinus infection. Her symptoms were exacerbated by long flights. Upon returning, she continued to feel unwell and underwent a chest x-ray.  She was treated with an inhaler and antibiotics, including a Z-Pak and took doxycycline  100 mg daily for malaria prophy.. She reports significant improvement in her cough and breathing but discontinued antibiotics due to prolonged use and side effects.  She has hx penicillin allergy from childhood, described as a rash, and was advised about the potential use of cephalosporins if needed. Her sinus issues are chronic, with a history of inflammation noted on an x-ray a few years ago. She describes her sinuses as always feeling heavy, sometimes causing headaches or dizziness.  Her family history includes chronic sinus problems, as her father had similar issues, and bronchiectasis, as both her father and aunt had the condition.   Obstructive sleep apnea doing well. Download reviewed. No concerns.    Assessment and Plan:   Obstructive sleep apnea -benefits from CPAP with good compliance and control  Chronic sinusitis Subacute bronchitis Chronic sinusitis with improving symptoms, no acute intervention needed. - Monitor symptoms, reassess if worsening or persistent. - Consider further treatment if no improvement.  Breast cancer with radiation therapy Breast cancer treated with radiation therapy in 2010. Imaging shows a shadow in the left chest and small nodules, possibly related to past radiation. Monitoring required for progression. - Await radiology report for chest x-ray evaluation. - Continue regular follow-up with oncologist  for lung nodules monitoring.  Allergy to  penicillin Reported childhood penicillin allergy, likely rash. Discussed cephalosporin use with 10% cross-reactivity risk. - Consider cephalosporin if needed, monitor for rash, use Benadryl  if necessary.     ROS-see HPI     + = positive Constitutional:   No-   weight loss, night sweats, + fevers, chills, fatigue, lassitude. HEENT:   + headaches, difficulty swallowing, tooth/dental problems, sore throat,       No-  sneezing, +itching, ear ache, +nasal congestion, +post nasal drip,  CV:  No-   chest pain, orthopnea, PND, swelling in lower extremities, anasarca, dizziness, palpitations Resp: No-   shortness of breath with exertion or at rest.            productive cough,  No non-productive cough,  No- coughing up of blood.           change in color of mucus.  No- wheezing.   Skin: No-   rash or lesions. GI:  No-   heartburn, indigestion, abdominal pain, nausea, vomiting,  GU:  MS:  + joint pain or swelling.   Neuro-     nothing unusual Psych:  No- change in mood or affect. No depression or anxiety.  No memory loss.  OBJ- Physical Exam    General- Alert, Oriented, Affect-appropriate, Distress- none acute, + overweight Skin- rash-none, lesions- none, excoriation- none Lymphadenopathy- none Head- atraumatic            Eyes- Gross vision intact, PERRLA, conjunctivae and secretions clear,  + mild proptosis            Ears- Hearing, canals-normal            Nose- + prominent turbinates, Septal dev +mild, no-mucus, polyps, erosion, perforation             Throat- Mallampati III-IV, mucosa clear , drainage- none, tonsils- atrophic, own teeth Neck- flexible , trachea midline, no stridor , thyroid  nl, carotid no bruit Chest - symmetrical excursion , unlabored           Heart/CV- RRR , no murmur , no gallop  , no rub, nl s1 s2                           - JVD- none , edema- none, stasis changes- none, varices- none           Lung- clear, wheeze- none, cough-none,   dullness-none, rub- none            Chest wall-  Abd-  Br/ Gen/ Rectal- Not done, not indicated Extrem- cyanosis- none, clubbing, none, atrophy- none, strength- nl Neuro- grossly intact to observation

## 2024-03-10 ENCOUNTER — Encounter: Payer: Self-pay | Admitting: Internal Medicine

## 2024-03-10 ENCOUNTER — Ambulatory Visit: Admitting: Internal Medicine

## 2024-03-10 VITALS — BP 137/79 | HR 60 | Temp 98.3°F | Ht 63.0 in | Wt 154.2 lb

## 2024-03-10 DIAGNOSIS — Z853 Personal history of malignant neoplasm of breast: Secondary | ICD-10-CM

## 2024-03-10 DIAGNOSIS — L988 Other specified disorders of the skin and subcutaneous tissue: Secondary | ICD-10-CM | POA: Diagnosis not present

## 2024-03-10 DIAGNOSIS — J329 Chronic sinusitis, unspecified: Secondary | ICD-10-CM

## 2024-03-10 DIAGNOSIS — G4733 Obstructive sleep apnea (adult) (pediatric): Secondary | ICD-10-CM

## 2024-03-10 DIAGNOSIS — D485 Neoplasm of uncertain behavior of skin: Secondary | ICD-10-CM | POA: Diagnosis not present

## 2024-03-10 NOTE — Patient Instructions (Signed)
 We can continue CPAP auto 5-15  We will call your CXR report when it comes from radiology.  If you don't feel well, let us  know!

## 2024-03-11 ENCOUNTER — Ambulatory Visit: Payer: Self-pay | Admitting: Primary Care

## 2024-03-11 DIAGNOSIS — R9389 Abnormal findings on diagnostic imaging of other specified body structures: Secondary | ICD-10-CM

## 2024-03-11 NOTE — Addendum Note (Signed)
 Addended by: Antonio Baumgarten on: 03/11/2024 02:49 PM   Modules accepted: Orders

## 2024-03-11 NOTE — Progress Notes (Signed)
 Please let patient know CXR showed similar appearing scarring to left lung. Could be from prior radiation but difficult to tell, needs HRCT to better evaluate. Please order re: abnormal CXR, pneumonia

## 2024-03-30 ENCOUNTER — Encounter: Payer: Self-pay | Admitting: Internal Medicine

## 2024-04-05 ENCOUNTER — Encounter: Payer: Self-pay | Admitting: Internal Medicine

## 2024-04-05 ENCOUNTER — Ambulatory Visit (INDEPENDENT_AMBULATORY_CARE_PROVIDER_SITE_OTHER): Payer: Medicare Other | Admitting: Internal Medicine

## 2024-04-05 VITALS — BP 124/70 | HR 52 | Temp 98.1°F | Ht 63.0 in | Wt 151.4 lb

## 2024-04-05 DIAGNOSIS — Z853 Personal history of malignant neoplasm of breast: Secondary | ICD-10-CM

## 2024-04-05 DIAGNOSIS — Z862 Personal history of diseases of the blood and blood-forming organs and certain disorders involving the immune mechanism: Secondary | ICD-10-CM | POA: Diagnosis not present

## 2024-04-05 DIAGNOSIS — Z0000 Encounter for general adult medical examination without abnormal findings: Secondary | ICD-10-CM | POA: Diagnosis not present

## 2024-04-05 DIAGNOSIS — Z79899 Other long term (current) drug therapy: Secondary | ICD-10-CM

## 2024-04-05 DIAGNOSIS — E1165 Type 2 diabetes mellitus with hyperglycemia: Secondary | ICD-10-CM | POA: Diagnosis not present

## 2024-04-05 DIAGNOSIS — E785 Hyperlipidemia, unspecified: Secondary | ICD-10-CM

## 2024-04-05 DIAGNOSIS — Z7985 Long-term (current) use of injectable non-insulin antidiabetic drugs: Secondary | ICD-10-CM | POA: Diagnosis not present

## 2024-04-05 DIAGNOSIS — I10 Essential (primary) hypertension: Secondary | ICD-10-CM | POA: Diagnosis not present

## 2024-04-05 LAB — MICROALBUMIN / CREATININE URINE RATIO
Creatinine,U: 189.9 mg/dL
Microalb Creat Ratio: 11.3 mg/g (ref 0.0–30.0)
Microalb, Ur: 2.1 mg/dL — ABNORMAL HIGH (ref 0.0–1.9)

## 2024-04-05 LAB — HEPATIC FUNCTION PANEL
ALT: 12 U/L (ref 0–35)
AST: 17 U/L (ref 0–37)
Albumin: 4.6 g/dL (ref 3.5–5.2)
Alkaline Phosphatase: 73 U/L (ref 39–117)
Bilirubin, Direct: 0.1 mg/dL (ref 0.0–0.3)
Total Bilirubin: 0.8 mg/dL (ref 0.2–1.2)
Total Protein: 7.3 g/dL (ref 6.0–8.3)

## 2024-04-05 LAB — CBC WITH DIFFERENTIAL/PLATELET
Basophils Absolute: 0 10*3/uL (ref 0.0–0.1)
Basophils Relative: 0.8 % (ref 0.0–3.0)
Eosinophils Absolute: 0.1 10*3/uL (ref 0.0–0.7)
Eosinophils Relative: 2.3 % (ref 0.0–5.0)
HCT: 42.7 % (ref 36.0–46.0)
Hemoglobin: 14.3 g/dL (ref 12.0–15.0)
Lymphocytes Relative: 30 % (ref 12.0–46.0)
Lymphs Abs: 1.6 10*3/uL (ref 0.7–4.0)
MCHC: 33.4 g/dL (ref 30.0–36.0)
MCV: 90.6 fl (ref 78.0–100.0)
Monocytes Absolute: 0.5 10*3/uL (ref 0.1–1.0)
Monocytes Relative: 9 % (ref 3.0–12.0)
Neutro Abs: 3 10*3/uL (ref 1.4–7.7)
Neutrophils Relative %: 57.9 % (ref 43.0–77.0)
Platelets: 177 10*3/uL (ref 150.0–400.0)
RBC: 4.71 Mil/uL (ref 3.87–5.11)
RDW: 14.4 % (ref 11.5–15.5)
WBC: 5.2 10*3/uL (ref 4.0–10.5)

## 2024-04-05 LAB — BASIC METABOLIC PANEL WITH GFR
BUN: 12 mg/dL (ref 6–23)
CO2: 28 meq/L (ref 19–32)
Calcium: 9.9 mg/dL (ref 8.4–10.5)
Chloride: 102 meq/L (ref 96–112)
Creatinine, Ser: 0.72 mg/dL (ref 0.40–1.20)
GFR: 80.9 mL/min (ref >60.00)
Glucose, Bld: 100 mg/dL — ABNORMAL HIGH (ref 70–99)
Potassium: 4.4 meq/L (ref 3.5–5.1)
Sodium: 138 meq/L (ref 135–145)

## 2024-04-05 LAB — LIPID PANEL
Cholesterol: 190 mg/dL (ref 0–200)
HDL: 79.7 mg/dL (ref >39.00)
LDL Cholesterol: 91 mg/dL (ref 0–99)
NonHDL: 110.59
Total CHOL/HDL Ratio: 2
Triglycerides: 96 mg/dL (ref 0.0–149.0)
VLDL: 19.2 mg/dL (ref 0.0–40.0)

## 2024-04-05 LAB — TSH: TSH: 1.71 u[IU]/mL (ref 0.35–5.50)

## 2024-04-05 LAB — HEMOGLOBIN A1C: Hgb A1c MFr Bld: 5.7 % (ref 4.6–6.5)

## 2024-04-05 MED ORDER — ALPRAZOLAM 0.25 MG PO TABS: ORAL_TABLET | ORAL | 0 refills | Status: DC

## 2024-04-05 MED ORDER — TIRZEPATIDE 7.5 MG/0.5ML ~~LOC~~ SOAJ: 7.5000 mg | SUBCUTANEOUS | 3 refills | Status: AC

## 2024-04-05 MED ORDER — LOSARTAN POTASSIUM 25 MG PO TABS: 12.5000 mg | ORAL_TABLET | Freq: Every day | ORAL | 1 refills | Status: DC

## 2024-04-05 MED ORDER — FLUOXETINE HCL 20 MG PO CAPS: 20.0000 mg | ORAL_CAPSULE | Freq: Two times a day (BID) | ORAL | 2 refills | Status: AC

## 2024-04-05 MED ORDER — ROSUVASTATIN CALCIUM 10 MG PO TABS: 10.0000 mg | ORAL_TABLET | ORAL | 1 refills | Status: DC

## 2024-04-05 MED ORDER — METFORMIN HCL ER 500 MG PO TB24: 1000.0000 mg | ORAL_TABLET | Freq: Every day | ORAL | 3 refills | Status: AC

## 2024-04-05 NOTE — Patient Instructions (Signed)
 Good to see y ou today  refilled meds . Lab today . Then plan fu  4-6 months

## 2024-04-05 NOTE — Progress Notes (Signed)
 Chief Complaint  Patient presents with   Annual Exam    Pt reports she is fasting and would like refill on her Rx. Rx pended. [facility]  [facility] [address]     HPI: Patient  Chelsey [name]  78 y.o. comes in today for Preventive Health Care visit  Med refill check  Had trip from [place] in may and  was a positive experience but got resp infection after returning home and rx buy dr Neysa.  DM weight   needs refill mounjaro   tolerating well  no sig se .  Mood stable  usin  infrequent alprazolam  if needed  last rx was over a year ago  cont ssri  Had updated ct chest no new concerning findings   Health Maintenance  Topic Date Due   Medicare Annual Wellness (AWV)  07/24/2017   FOOT EXAM  01/19/2021   COVID-19 Vaccine (6 - 2024-25 season) 04/21/2024 (Originally 06/14/2023)   Zoster Vaccines- Shingrix (1 of 2) 07/06/2024 (Originally 04/16/1965)   OPHTHALMOLOGY EXAM  07/26/2024 (Originally 12/22/2023)   Pneumococcal Vaccine: 50+ Years (2 of 2 - PPSV23, PCV20, or PCV21) 04/05/2025 (Originally 01/19/2014)   Hepatitis C Screening  04/05/2025 (Originally 04/16/1964)   INFLUENZA VACCINE  05/13/2024   HEMOGLOBIN A1C  10/05/2024   Diabetic kidney evaluation - eGFR measurement  04/05/2025   Diabetic kidney evaluation - Urine ACR  04/05/2025   Colonoscopy  02/12/2029   DTaP/Tdap/Td (4 - Td or Tdap) 12/07/2033   DEXA SCAN  Completed   Hepatitis B Vaccines  Aged Out   HPV VACCINES  Aged Out   Meningococcal B Vaccine  Aged Out   Health Maintenance Review LIFESTYLE:  Exercise:   active on trip Tobacco/ETS:n Alcohol: s Sugar beverages:n Sleep:osa Drug use: no HH of 2 Work: Veterinary surgeon     ROS:  no numbenss feet  or claudication GEN/ HEENT: No fever, significant weight changes sweats headaches vision problems hearing changes, CV/ PULM; No chest pain shortness of breath cough, syncope,edema  change in exercise tolerance. GI /GU: No adominal pain,  vomiting, change in bowel habits. No blood in the stool. No significant GU symptoms. SKIN/HEME: ,no acute skin rashes suspicious lesions or bleeding. No lymphadenopathy, nodules, masses.  NEURO/ PSYCH:  No neurologic signs such as weakness numbness. No depression anxiety. IMM/ Allergy: No unusual infections.  Allergy .   REST of 12 system review negative except as per HPI   Past Medical History:  Diagnosis Date   Allergy    Aphthous ulcer of mouth 2010   related to chemotherapy    Breast cancer [facility]) 2010   2010 & 2011   Carpal tunnel syndrome of right wrist    Colitis    Colon polyp    Diabetes mellitus    Diverticulosis    GERD (gastroesophageal reflux disease)    Hyperlipidemia    Hypertension    Lymphedema 08/31/2013   left arm   Osteoarthritis    Pneumonia 07/28/2011   Sinusitis    Sleep apnea    CPAP-setting of 10   Squamous cell carcinoma    left leg    Past Surgical History:  Procedure Laterality Date   AUGMENTATION MAMMAPLASTY Left    CESAREAN SECTION     x 3   COLONOSCOPY  12/02/2018   Dr.Beavers   MASTECTOMY Left 11/23   2010   RECONSTRUCTION BREAST IMMEDIATE / DELAYED W/ TISSUE EXPANDER Left    REDUCTION  MAMMAPLASTY Right    SKIN GRAFT     SQUAMOUS CELL CARCINOMA EXCISION Left    Left lower extremity   TONSILLECTOMY     as child   TOTAL KNEE ARTHROPLASTY Left 12/20/2012   Procedure: TOTAL KNEE ARTHROPLASTY;  Surgeon: Dempsey LULLA Moan, MD;  Location: [facility];  Service: Orthopedics;  Laterality: Left;   TOTAL KNEE ARTHROPLASTY Right 09/12/2013   Procedure: RIGHT TOTAL KNEE ARTHROPLASTY;  Surgeon: Dempsey LULLA Moan, MD;  Location: [facility];  Service: Orthopedics;  Laterality: Right;   TUBAL LIGATION      Family History  Problem Relation Age of Onset   Alzheimer's disease Mother    Sleep apnea Mother    Diabetes Mother    Hypertension Mother    Hyperlipidemia Mother    Breast cancer Mother 59       triple negative   Anxiety disorder Daughter     Hypertension Son        pulmonary   Congenital heart disease Son 0       TGA  died age 57    Colon cancer Neg Hx    Colon polyps Neg Hx    Esophageal cancer Neg Hx    Rectal cancer Neg Hx    Stomach cancer Neg Hx     Social History   Socioeconomic History   Marital status: Married    Spouse name: Not on file   Number of children: 3   Years of education: Not on file   Highest education level: Not on file  Occupational History   Occupation: Engineer, drilling: [employer]   Occupation: Engineer, drilling: [employer]  Tobacco Use   Smoking status: Never   Smokeless tobacco: Never  Vaping Use   Vaping status: Never Used  Substance and Sexual Activity   Alcohol use: Yes    Alcohol/week: 7.0 - 14.0 standard drinks of alcohol    Types: 7 - 14 Glasses of wine per week    Comment: 1-2 glasses every other night   Drug use: No   Sexual activity: Yes    Birth control/protection: Post-menopausal, Surgical  Other Topics Concern   Not on file  Social History Narrative   cb x 3   Realtor married    Bereaved  Parent   2 adult children   Mother with dementia and breast cancer   Passed away sept 63 15  Age 24   Social Drivers of Corporate investment banker Strain: Low Risk  (03/11/2023)   Overall Financial Resource Strain (CARDIA)    Difficulty of Paying Living Expenses: Not hard at all  Food Insecurity: No Food Insecurity (03/11/2023)   Hunger Vital Sign    Worried About Running Out of Food in the Last Year: Never true    Ran Out of Food in the Last Year: Never true  Transportation Needs: No Transportation Needs (03/11/2023)   PRAPARE - Administrator, Civil Service (Medical): No    Lack of Transportation (Non-Medical): No  Physical Activity: Inactive (03/11/2023)   Exercise Vital Sign    Days of Exercise per Week: 0 days    Minutes of Exercise per Session: 0 min  Stress: Stress Concern Present (03/11/2023)   [employer] of Occupational Health -  Occupational Stress Questionnaire    Feeling of Stress : Rather much  Social Connections: Moderately Integrated (03/11/2023)   Social Connection and Isolation Panel    Frequency of Communication with Friends  and Family: More than three times a week    Frequency of Social Gatherings with Friends and Family: Once a week    Attends Religious Services: Never    Database administrator or Organizations: Yes    Attends Engineer, structural: More than 4 times per year    Marital Status: Married    Outpatient Medications Prior to Visit  Medication Sig Dispense Refill   albuterol  (VENTOLIN  HFA) 108 (90 Base) MCG/ACT inhaler Inhale 2 puffs into the lungs every 6 (six) hours as needed for wheezing or shortness of breath. 8 g 2   azelastine  (ASTELIN ) 0.1 % nasal spray INSTILL 1 TO 2 SPRAYS INTO EACH NOSTRIL TWICE DAILY AS NEEDED 30 mL 12   calcium  citrate (CALCITRATE - DOSED IN MG ELEMENTAL CALCIUM ) 950 (200 Ca) MG tablet Take 1 tablet by mouth. Every other day     cyanocobalamin  1000 MCG tablet Take 1 tablet by mouth daily.     esomeprazole  (NEXIUM ) 40 MG capsule Take 40 mg by mouth as needed. Takes bedtime     traZODone  (DESYREL ) 50 MG tablet TAKE 1/2 TO 1 TABLET(25 TO 50 MG) BY MOUTH AT BEDTIME AS NEEDED FOR SLEEP 30 tablet 3   senna (SENOKOT) 8.6 MG tablet Take 1 tablet by mouth as needed for constipation. (Patient not taking: Reported on 04/05/2024)     ALPRAZolam  (XANAX ) 0.25 MG tablet TAKE 1 TABLET(0.25 MG) BY MOUTH TWICE DAILY AS NEEDED FOR ANXIETY. AVOID REGULAR USE 30 tablet 0   azithromycin  (ZITHROMAX ) 500 MG tablet Take 1 tablet (500 mg total) by mouth daily. If needed for travelers diarrhea (Patient not taking: Reported on 04/05/2024) 3 tablet 0   doxycycline  (VIBRA -TABS) 100 MG tablet 100 mg orally once daily beginning 1 to 2 days prior to travel, continued during travel, and for 4 weeks after leaving malarious area . (Patient not taking: Reported on 03/10/2024) 45 tablet 0   FLUoxetine   (PROZAC ) 20 MG capsule TAKE 1 CAPSULE (20 MG TOTAL) BY MOUTH 2 (TWO) TIMES DAILY. DOSAGE CHANGE 180 capsule 2   guaiFENesin  (MUCINEX ) 600 MG 12 hr tablet Take 1 tablet (600 mg total) by mouth 2 (two) times daily as needed for to loosen phlegm or cough. (Patient not taking: Reported on 04/05/2024) 30 tablet 0   losartan  (COZAAR ) 25 MG tablet Take 0.5 tablets (12.5 mg total) by mouth daily. 45 tablet 1   metFORMIN  (GLUCOPHAGE -XR) 500 MG 24 hr tablet TAKE 2 TABLETS BY MOUTH TWICE DAILY WITH MEALS (Patient taking differently: Take 500 mg by mouth daily.) 360 tablet 0   predniSONE  (DELTASONE ) 10 MG tablet Take 2 tablets x 5 days; 1 tablet x 5 days; then stop 15 tablet 0   promethazine -dextromethorphan (PROMETHAZINE -DM) 6.25-15 MG/5ML syrup Take 5 mLs by mouth 4 (four) times daily as needed. 240 mL 0   rosuvastatin  (CRESTOR ) 10 MG tablet Take 1 tablet (10 mg total) by mouth every other day. 90 tablet 1   tirzepatide  (MOUNJARO ) 7.5 MG/0.5ML Pen Inject 7.5 mg into the skin once a week. Dosage change 6 mL 3   Facility-Administered Medications Prior to Visit  Medication Dose Route Frequency Provider Last Rate Last Admin   0.9 %  sodium chloride  infusion  500 mL Intravenous Once Beavers, Kimberly, MD         EXAM:  BP 124/70   Pulse (!) 52   Temp 98.1 F (36.7 C)   Ht 5' 3 (1.6 m)   Wt 151 lb 6.4 oz (68.7  kg)   SpO2 98%   BMI 26.82 kg/m   Body mass index is 26.82 kg/m. Wt Readings from Last 3 Encounters:  04/05/24 151 lb 6.4 oz (68.7 kg)  03/10/24 154 lb 3.2 oz (69.9 kg)  02/10/24 152 lb 6.4 oz (69.1 kg)    Physical Exam: Vital signs reviewed HZW:Uypd is a well-developed well-nourished alert cooperative    who appearsr stated age in no acute distress.  HEENT: normocephalic atraumatic , Eyes: PERRL EOM's full, conjunctiva clear, Nares: paten,t no deformity discharge or tenderness., Ears: no deformity EAC's clear TMs with normal landmarks. Mouth: clear OP, no lesions, edema.  Moist mucous  membranes. Dentition in adequate repair. NECK: supple without masses, thyromegaly or bruits. CHEST/PULM:  Clear to auscultation and percussion breath sounds equal no wheeze , rales or rhonchi. No chest wall deformities or tenderness. CV: PMI is nondisplaced, S1 S2 no gallops, murmurs, rubs. Peripheral pulses are full without delay.No JVD .  ABDOMEN: Bowel sounds normal nontender  No guard or rebound, no hepato splenomegal no CVA tenderness.  Extremtities:  No clubbing cyanosis or edema, no acute joint swelling or redness no focal atrophy healed knee scar  NEURO:  Oriented x3, cranial nerves 3-12 appear to be intact, no obvious focal weakness,gait within normal limits no abnormal reflexes or asymmetrical SKIN: No acute rashes normal turgor, color, no bruising or petechiae. PSYCH: Oriented, good eye contact, no obvious depression anxiety, cognition and judgment appear normal. LN: no cervical axillary adenopathy   Diabetic foot exam was performed with the following findings:   No deformities, ulcerations, or other skin breakdown Normal sensation of 10g monofilament Intact posterior tibialis and dorsalis pedis pulses      Lab Results  Component Value Date   WBC 5.2 04/05/2024   HGB 14.3 04/05/2024   HCT 42.7 04/05/2024   PLT 177.0 04/05/2024   GLUCOSE 100 (H) 04/05/2024   CHOL 190 04/05/2024   TRIG 96.0 04/05/2024   HDL 79.70 04/05/2024   LDLDIRECT 173.9 11/09/2013   LDLCALC 91 04/05/2024   ALT 12 04/05/2024   AST 17 04/05/2024   NA 138 04/05/2024   K 4.4 04/05/2024   CL 102 04/05/2024   CREATININE 0.72 04/05/2024   BUN 12 04/05/2024   CO2 28 04/05/2024   TSH 1.71 04/05/2024   INR 1.0 06/03/2021   HGBA1C 5.7 04/05/2024   MICROALBUR 2.1 (H) 04/05/2024    BP Readings from Last 3 Encounters:  04/05/24 124/70  03/10/24 137/79  02/10/24 124/72    Lab plan reviewed with patient   ASSESSMENT AND PLAN:  Discussed the following assessment and plan:    ICD-10-CM   1.  Visit for preventive health examination  Z00.00     2. Type 2 diabetes mellitus with hyperglycemia, without long-term current use of insulin  (HCC)  E11.65 Microalbumin/Creatinine Ratio, Urine    Basic metabolic panel with GFR    CBC with Differential/Platelet    Hemoglobin A1c    Hepatic function panel    Lipid panel    TSH    Microalbumin / creatinine urine ratio    TSH    Lipid panel    Hepatic function panel    Hemoglobin A1c    Basic metabolic panel   good response  dm and weight  no sig se refill medication mounjaro     3. Essential hypertension  I10 Basic metabolic panel with GFR    CBC with Differential/Platelet    Hemoglobin A1c    Hepatic function panel  Lipid panel    TSH    Microalbumin / creatinine urine ratio    TSH    Lipid panel    Hepatic function panel    Hemoglobin A1c    Basic metabolic panel   controlled    4. Medication management  Z79.899 Basic metabolic panel with GFR    CBC with Differential/Platelet    Hemoglobin A1c    Hepatic function panel    Lipid panel    TSH    Microalbumin / creatinine urine ratio    TSH    Lipid panel    Hepatic function panel    Hemoglobin A1c    Basic metabolic panel    5. Hyperlipidemia, unspecified hyperlipidemia type  E78.5 Basic metabolic panel with GFR    CBC with Differential/Platelet    Hemoglobin A1c    Hepatic function panel    Lipid panel    TSH    Microalbumin / creatinine urine ratio    TSH    Lipid panel    Hepatic function panel    Hemoglobin A1c    Basic metabolic panel   crestor  to continue    6. HX: breast cancer  Z85.3 Microalbumin / creatinine urine ratio    TSH    Lipid panel    Hepatic function panel    Hemoglobin A1c    Basic metabolic panel    7. History of ITP  Z86.2 Microalbumin / creatinine urine ratio    TSH    Lipid panel    Hepatic function panel    Hemoglobin A1c    Basic metabolic panel     No follow-ups on file.  Patient Care Team: Nekesha Font, Apolinar POUR, MD as PCP  - Diedre Melodi Lerner, MD as Attending Physician (Orthopedic Surgery) Neysa Reggy BIRCH, MD as Consulting Physician (Pulmonary Disease) Octavia Charleston, MD as Consulting Physician (Ophthalmology) Al Marcus GAILS, [facility] (Inactive) as Pharmacist (Pharmacist) Siri Pulling, MD as Physician Assistant (Internal Medicine) Alm Olam SAUNDERS, MD as Referring Physician (Plastic Surgery) Siri Pulling, MD as Physician Assistant (Internal Medicine) Octavia Charlie Hamilton, MD as Consulting Physician (Ophthalmology) Patient Instructions  Good to see y ou today  refilled meds . Lab today . Then plan fu  4-6 months    Maizey Menendez K. Donie Lemelin M.D.

## 2024-04-06 ENCOUNTER — Ambulatory Visit: Payer: Self-pay | Admitting: Internal Medicine

## 2024-04-06 NOTE — Progress Notes (Signed)
 Results in range  or   clinically insignificant   A1c is good  Ldl up some  gaol is 70 and below currently 90 range ( possibly effect  of diet with recent travel) but   we can follow .

## 2024-05-02 DIAGNOSIS — S233XXA Sprain of ligaments of thoracic spine, initial encounter: Secondary | ICD-10-CM | POA: Diagnosis not present

## 2024-05-02 DIAGNOSIS — M9902 Segmental and somatic dysfunction of thoracic region: Secondary | ICD-10-CM | POA: Diagnosis not present

## 2024-05-03 DIAGNOSIS — M9902 Segmental and somatic dysfunction of thoracic region: Secondary | ICD-10-CM | POA: Diagnosis not present

## 2024-05-03 DIAGNOSIS — S233XXA Sprain of ligaments of thoracic spine, initial encounter: Secondary | ICD-10-CM | POA: Diagnosis not present

## 2024-05-04 DIAGNOSIS — S233XXA Sprain of ligaments of thoracic spine, initial encounter: Secondary | ICD-10-CM | POA: Diagnosis not present

## 2024-05-04 DIAGNOSIS — M9902 Segmental and somatic dysfunction of thoracic region: Secondary | ICD-10-CM | POA: Diagnosis not present

## 2024-05-09 DIAGNOSIS — M9902 Segmental and somatic dysfunction of thoracic region: Secondary | ICD-10-CM | POA: Diagnosis not present

## 2024-05-09 DIAGNOSIS — S233XXA Sprain of ligaments of thoracic spine, initial encounter: Secondary | ICD-10-CM | POA: Diagnosis not present

## 2024-05-10 DIAGNOSIS — S233XXA Sprain of ligaments of thoracic spine, initial encounter: Secondary | ICD-10-CM | POA: Diagnosis not present

## 2024-05-10 DIAGNOSIS — M9902 Segmental and somatic dysfunction of thoracic region: Secondary | ICD-10-CM | POA: Diagnosis not present

## 2024-05-11 DIAGNOSIS — S233XXA Sprain of ligaments of thoracic spine, initial encounter: Secondary | ICD-10-CM | POA: Diagnosis not present

## 2024-05-11 DIAGNOSIS — M9902 Segmental and somatic dysfunction of thoracic region: Secondary | ICD-10-CM | POA: Diagnosis not present

## 2024-05-13 NOTE — Procedures (Signed)
Mask fit

## 2024-05-16 DIAGNOSIS — M9902 Segmental and somatic dysfunction of thoracic region: Secondary | ICD-10-CM | POA: Diagnosis not present

## 2024-05-16 DIAGNOSIS — S233XXA Sprain of ligaments of thoracic spine, initial encounter: Secondary | ICD-10-CM | POA: Diagnosis not present

## 2024-05-17 DIAGNOSIS — M9902 Segmental and somatic dysfunction of thoracic region: Secondary | ICD-10-CM | POA: Diagnosis not present

## 2024-05-17 DIAGNOSIS — S233XXA Sprain of ligaments of thoracic spine, initial encounter: Secondary | ICD-10-CM | POA: Diagnosis not present

## 2024-05-18 DIAGNOSIS — M9902 Segmental and somatic dysfunction of thoracic region: Secondary | ICD-10-CM | POA: Diagnosis not present

## 2024-05-18 DIAGNOSIS — S233XXA Sprain of ligaments of thoracic spine, initial encounter: Secondary | ICD-10-CM | POA: Diagnosis not present

## 2024-05-23 DIAGNOSIS — M9902 Segmental and somatic dysfunction of thoracic region: Secondary | ICD-10-CM | POA: Diagnosis not present

## 2024-05-23 DIAGNOSIS — S233XXA Sprain of ligaments of thoracic spine, initial encounter: Secondary | ICD-10-CM | POA: Diagnosis not present

## 2024-05-24 DIAGNOSIS — S233XXA Sprain of ligaments of thoracic spine, initial encounter: Secondary | ICD-10-CM | POA: Diagnosis not present

## 2024-05-24 DIAGNOSIS — M9902 Segmental and somatic dysfunction of thoracic region: Secondary | ICD-10-CM | POA: Diagnosis not present

## 2024-05-30 DIAGNOSIS — S233XXA Sprain of ligaments of thoracic spine, initial encounter: Secondary | ICD-10-CM | POA: Diagnosis not present

## 2024-05-30 DIAGNOSIS — M9902 Segmental and somatic dysfunction of thoracic region: Secondary | ICD-10-CM | POA: Diagnosis not present

## 2024-06-01 DIAGNOSIS — M9902 Segmental and somatic dysfunction of thoracic region: Secondary | ICD-10-CM | POA: Diagnosis not present

## 2024-06-01 DIAGNOSIS — S233XXA Sprain of ligaments of thoracic spine, initial encounter: Secondary | ICD-10-CM | POA: Diagnosis not present

## 2024-06-06 DIAGNOSIS — M9902 Segmental and somatic dysfunction of thoracic region: Secondary | ICD-10-CM | POA: Diagnosis not present

## 2024-06-06 DIAGNOSIS — S233XXA Sprain of ligaments of thoracic spine, initial encounter: Secondary | ICD-10-CM | POA: Diagnosis not present

## 2024-06-07 ENCOUNTER — Other Ambulatory Visit: Payer: Self-pay | Admitting: Internal Medicine

## 2024-06-08 DIAGNOSIS — M9902 Segmental and somatic dysfunction of thoracic region: Secondary | ICD-10-CM | POA: Diagnosis not present

## 2024-06-08 DIAGNOSIS — S233XXA Sprain of ligaments of thoracic spine, initial encounter: Secondary | ICD-10-CM | POA: Diagnosis not present

## 2024-06-15 DIAGNOSIS — S233XXA Sprain of ligaments of thoracic spine, initial encounter: Secondary | ICD-10-CM | POA: Diagnosis not present

## 2024-06-15 DIAGNOSIS — M9902 Segmental and somatic dysfunction of thoracic region: Secondary | ICD-10-CM | POA: Diagnosis not present

## 2024-06-29 DIAGNOSIS — S233XXA Sprain of ligaments of thoracic spine, initial encounter: Secondary | ICD-10-CM | POA: Diagnosis not present

## 2024-07-03 ENCOUNTER — Other Ambulatory Visit: Payer: Self-pay | Admitting: Internal Medicine

## 2024-07-25 DIAGNOSIS — M9902 Segmental and somatic dysfunction of thoracic region: Secondary | ICD-10-CM | POA: Diagnosis not present

## 2024-07-25 DIAGNOSIS — S233XXA Sprain of ligaments of thoracic spine, initial encounter: Secondary | ICD-10-CM | POA: Diagnosis not present

## 2024-09-05 ENCOUNTER — Telehealth: Payer: Self-pay | Admitting: Internal Medicine

## 2024-09-05 NOTE — Telephone Encounter (Signed)
 Fax received from Dr. Selinda Gosling with [facility] to perform a right reverse shoulder arthroplasty with choice anesthesia  on patient.  Patient needs surgery clearance. Surgery is . Patient was seen on 03/10/24 . Office protocol is a risk assessment can be sent to surgeon if patient has been seen in 60 days or less.   Needs appt- called pt and there was no answer- LMTCB

## 2024-09-06 ENCOUNTER — Telehealth: Payer: Self-pay

## 2024-09-06 NOTE — Telephone Encounter (Signed)
 Received a surgical clearance form from [facility].  Attempted to reach pt. Left a voicemail to call us  back.

## 2024-09-06 NOTE — Telephone Encounter (Signed)
 Patient called back and I read the note from leslie to get her scheduled . So I got the patient scheduled with almarie ferrari January 14th . If there was anything else please give patient a call back and I spoke with Mr almeda and he told me to send a crm Sonny is with a patient

## 2024-09-12 NOTE — Telephone Encounter (Signed)
 Spoke to pt last week. Appt was made.

## 2024-09-30 ENCOUNTER — Other Ambulatory Visit: Payer: Self-pay | Admitting: Internal Medicine

## 2024-10-25 ENCOUNTER — Other Ambulatory Visit: Payer: Self-pay | Admitting: Orthopedic Surgery

## 2024-10-25 DIAGNOSIS — M25511 Pain in right shoulder: Secondary | ICD-10-CM

## 2024-10-26 ENCOUNTER — Ambulatory Visit: Admitting: Primary Care

## 2024-10-26 ENCOUNTER — Encounter: Payer: Self-pay | Admitting: Primary Care

## 2024-10-26 VITALS — BP 138/84 | HR 67 | Temp 97.6°F | Ht 63.5 in | Wt 142.8 lb

## 2024-10-26 DIAGNOSIS — R911 Solitary pulmonary nodule: Secondary | ICD-10-CM | POA: Diagnosis not present

## 2024-10-26 DIAGNOSIS — G4733 Obstructive sleep apnea (adult) (pediatric): Secondary | ICD-10-CM

## 2024-10-26 DIAGNOSIS — Z23 Encounter for immunization: Secondary | ICD-10-CM

## 2024-10-26 DIAGNOSIS — J329 Chronic sinusitis, unspecified: Secondary | ICD-10-CM

## 2024-10-26 DIAGNOSIS — Z01811 Encounter for preprocedural respiratory examination: Secondary | ICD-10-CM | POA: Diagnosis not present

## 2024-10-26 NOTE — Patient Instructions (Addendum)
" °  VISIT SUMMARY: During your follow-up visit, we discussed your well-controlled sleep apnea, chronic sinusitis, and the monitoring of your pulmonary nodule. Cleared from pulmonary standpoint for shoulder surgery, low risk for prolonged mechanical ventilation and/or post op pulmonary complications.  YOUR PLAN: -OBSTRUCTIVE SLEEP APNEA: Obstructive sleep apnea is a condition where your breathing repeatedly stops and starts during sleep. Your sleep apnea is well-controlled with your CPAP machine, and your current settings are appropriate despite your recent weight loss. We have ordered CPAP supplies through [employer] and enrolled you in auto-renewal. If you experience regular discomfort, we may consider adjusting the pressure settings. Ensure early movement after your surgery to reduce the risk of blood clots.  -CHRONIC SINUSITIS: Chronic sinusitis is a condition where the cavities around your nasal passages become inflamed and swollen for at least 12 weeks, despite treatment attempts. You experience sinus infections, especially after air travel. We discussed the potential use of cefdinir  for sinus infections, and you should discontinue it and take Benadryl  if you develop a rash.  -PULMONARY NODULE, RIGHT LUNG: A pulmonary nodule is a small, round growth in the lung. Given your history of breast cancer, we are monitoring the nodule in your right lung with regular CT scans. There are no new findings on your recent scan, and you have no current respiratory symptoms.  INSTRUCTIONS: Continue using your CPAP machine with the current settings. If you experience regular discomfort, we may consider adjusting the pressure settings. Ensure early movement after your shoulder surgery to reduce the risk of blood clots. For sinus infections, consider using cefdinir  and discontinue it if you develop a rash, taking Benadryl  as needed. Continue monitoring your pulmonary nodule with regular CT scans.  Follow-up 6 months with  Beth NP or sooner if needed   "

## 2024-10-26 NOTE — Progress Notes (Signed)
 "  @Patient  ID: Chelsey [name], female    DOB: [birth year], 79 y.o.   MRN: [id number]  Chief Complaint  Patient presents with   Medical Management of Chronic Issues    Right Reverse shoulder arthroplasty with [facility]. Not scheduled yet. DR Catha   Obstructive Sleep Apnea    Referring provider: Charlett Apolinar POUR, MD  HPI:  79 year old female, never smoked. PMH significant for HTN, OSA, allergic rhinitis, lung nodule, type 2 diabetes, osteoarthritis, breast cancer, obesity. Patient of Dr. Neysa. Maintained on auto CPAP.  02/15/2024 Discussed the use of AI scribe software for clinical note transcription with the patient, who gave verbal consent to proceed.  History of Present Illness   Chelsey [name] is a 79 year old female who presents with persistent cough and wheezing.  She was previously seen on April 30th for bronchitis and has been on antibiotics, including a Z-Pak during her travel to [place] and doxycycline  for malaria prevention. Despite these treatments, she continues to experience a persistent cough and wheezing, particularly in the afternoon when taking deep breaths. The cough is described as 'hacky' and is accompanied by wheezing.  A chest x-ray showed a subtle opacity or possible infiltrate, along with chronic bronchitic changes. She has been using Mucinex , albuterol , and promethazine -dm, but the cough persists. Her sinuses are bleeding on the left side and her ears feel clogged.  She has a history of allergies to Levaquin and penicillin, with the latter causing a rash during childhood. She also had ITP three years ago and was treated with steroids at that time. She can take prednisone , which she associates with a rash in the past.  She is currently using a prescription nasal spray, Astelin , twice a day.      10/26/2024- Interim hx  Discussed the use of AI scribe software for clinical note transcription with the patient, who gave verbal consent to proceed.  History of  Present Illness Chelsey [name] is a 79 year old female who presents for a follow-up visit and planning for right shoulder arthroplasty.  She has severe sleep apnea diagnosed in July 2020 with an apnea-hypopnea index (AHI) of 47. She uses a CPAP machine consistently, with 100% compliance over the last 30 days, averaging 8 hours and 40 minutes of use per night. The CPAP is set on auto settings with a pressure range of 5 to 15, and her current AHI is 1.1, indicating well-controlled sleep apnea. No issues with the CPAP machine, which was replaced in March 2023, but she mentions difficulties with obtaining supplies, requiring her to contact the supplier directly. She has experienced significant weight loss and questions whether this might affect her CPAP settings. Occasionally, she feels the CPAP pressure is too high, causing discomfort, but this is infrequent. She uses trazodone  at night to aid sleep.  She is planning to undergo right shoulder arthroplasty with Dr. Sharl at [facility], with the surgery likely scheduled for the end of February. She has no known issues with anesthesia except for a past experience where local anesthetic caused a drop in blood pressure during eyelid surgery.  She has a history of chronic sinusitis, which tends to flare up when traveling, particularly by plane, leading to sinus infections. She recalls a sinus infection after a trip to [place] in April of the previous year. She has a penicillin allergy and has been advised to use Benadryl  if a rash develops.  She has a history of breast cancer, for which she  underwent radiation, chemotherapy, mastectomy, and reconstruction. She has a nodule in her right lung that is monitored with CT scans due to the potential for metastasis from her previous breast cancer. No current breathing difficulties.      Allergies[1]  Immunization History  Administered Date(s) Administered   Fluad Quad(high Dose 65+) 08/30/2019, 08/07/2020,  08/20/2022   Fluad Trivalent(High Dose 65+) 08/11/2023   Hepatitis A, Adult 01/09/2024   INFLUENZA, HIGH DOSE SEASONAL PF 07/02/2015, 07/24/2016, 08/04/2017, 08/26/2018, 09/24/2021   Influenza,inj,Quad PF,6+ Mos 06/23/2013   Moderna Sars-Covid-2 Vaccination 12/25/2020   PFIZER(Purple Top)SARS-COV-2 Vaccination 11/19/2019, 12/13/2019, 07/05/2020   Pneumococcal Conjugate-13 11/24/2013   Td 10/03/2010   Tdap 10/03/2010, 12/08/2023   Unspecified SARS-COV-2 Vaccination 07/05/2020    Past Medical History:  Diagnosis Date   Allergy    Aphthous ulcer of mouth 2010   related to chemotherapy    Breast cancer [facility]) 2010   2010 & 2011   Carpal tunnel syndrome of right wrist    Colitis    Colon polyp    Diabetes mellitus    Diverticulosis    GERD (gastroesophageal reflux disease)    Hyperlipidemia    Hypertension    Lymphedema 08/31/2013   left arm   Osteoarthritis    Pneumonia 07/28/2011   Sinusitis    Sleep apnea    CPAP-setting of 10   Squamous cell carcinoma    left leg    Tobacco History: Tobacco Use History[2] Counseling given: Not Answered   Outpatient Medications Prior to Visit  Medication Sig Dispense Refill   albuterol  (VENTOLIN  HFA) 108 (90 Base) MCG/ACT inhaler Inhale 2 puffs into the lungs every 6 (six) hours as needed for wheezing or shortness of breath. 8 g 2   ALPRAZolam  (XANAX ) 0.25 MG tablet TAKE 1 TABLET(0.25 MG) BY MOUTH TWICE DAILY AS NEEDED FOR ANXIETY. AVOID REGULAR USE 30 tablet 0   azelastine  (ASTELIN ) 0.1 % nasal spray INSTILL 1 TO 2 SPRAYS INTO EACH NOSTRIL TWICE DAILY AS NEEDED 30 mL 12   calcium  citrate (CALCITRATE - DOSED IN MG ELEMENTAL CALCIUM ) 950 (200 Ca) MG tablet Take 1 tablet by mouth. Every other day     cyanocobalamin  1000 MCG tablet Take 1 tablet by mouth daily.     esomeprazole  (NEXIUM ) 40 MG capsule Take 40 mg by mouth as needed. Takes bedtime     FLUoxetine  (PROZAC ) 20 MG capsule Take 1 capsule (20 mg total) by mouth 2 (two) times  daily. Dosage change 180 capsule 2   losartan  (COZAAR ) 25 MG tablet Take 0.5 tablets (12.5 mg total) by mouth daily. 45 tablet 1   metFORMIN  (GLUCOPHAGE -XR) 500 MG 24 hr tablet Take 2 tablets (1,000 mg total) by mouth daily with breakfast. 180 tablet 3   rosuvastatin  (CRESTOR ) 10 MG tablet Take 1 tablet (10 mg total) by mouth every other day. 90 tablet 1   senna (SENOKOT) 8.6 MG tablet Take 1 tablet by mouth as needed for constipation.     tirzepatide  (MOUNJARO ) 7.5 MG/0.5ML Pen Inject 7.5 mg into the skin once a week. Dosage change 6 mL 3   traZODone  (DESYREL ) 50 MG tablet TAKE 1/2 TO 1 TABLET(25 TO 50 MG) BY MOUTH AT BEDTIME AS NEEDED FOR SLEEP 30 tablet 3   Facility-Administered Medications Prior to Visit  Medication Dose Route Frequency Provider Last Rate Last Admin   0.9 %  sodium chloride  infusion  500 mL Intravenous Once Beavers, Kimberly, MD        Review of Systems  Review of Systems  Constitutional: Negative.   HENT: Negative.    Respiratory: Negative.    Cardiovascular: Negative.      Physical Exam  BP 138/84   Pulse 67   Temp 97.6 F (36.4 C)   Ht 5' 3.5 (1.613 m) Comment: pt stated  Wt 142 lb 12.8 oz (64.8 kg)   SpO2 100% Comment: ra  BMI 24.90 kg/m  Physical Exam Constitutional:      Appearance: Normal appearance. She is well-developed.  HENT:     Head: Normocephalic and atraumatic.     Mouth/Throat:     Mouth: Mucous membranes are moist.     Pharynx: Oropharynx is clear.  Eyes:     Pupils: Pupils are equal, round, and reactive to light.  Cardiovascular:     Rate and Rhythm: Normal rate and regular rhythm.     Heart sounds: Normal heart sounds. No murmur heard. Pulmonary:     Effort: Pulmonary effort is normal. No respiratory distress.     Breath sounds: Normal breath sounds. No wheezing or rhonchi.  Musculoskeletal:        General: Normal range of motion.     Cervical back: Normal range of motion and neck supple.  Skin:    General: Skin is warm  and dry.     Findings: No erythema or rash.  Neurological:     General: No focal deficit present.     Mental Status: She is alert and oriented to person, place, and time. Mental status is at baseline.  Psychiatric:        Mood and Affect: Mood normal.        Behavior: Behavior normal.        Thought Content: Thought content normal.        Judgment: Judgment normal.      Lab Results:  CBC    Component Value Date/Time   WBC 5.2 04/05/2024 1117   RBC 4.71 04/05/2024 1117   HGB 14.3 04/05/2024 1117   HGB 12.9 03/30/2013 1020   HCT 42.7 04/05/2024 1117   HCT 37.4 03/30/2013 1020   PLT 177.0 04/05/2024 1117   PLT 206 03/30/2013 1020   MCV 90.6 04/05/2024 1117   MCV 83.9 03/30/2013 1020   MCH 30.5 09/14/2013 0457   MCHC 33.4 04/05/2024 1117   RDW 14.4 04/05/2024 1117   RDW 15.3 (H) 03/30/2013 1020   LYMPHSABS 1.6 04/05/2024 1117   LYMPHSABS 1.6 03/30/2013 1020   MONOABS 0.5 04/05/2024 1117   MONOABS 0.3 03/30/2013 1020   EOSABS 0.1 04/05/2024 1117   EOSABS 0.1 03/30/2013 1020   BASOSABS 0.0 04/05/2024 1117   BASOSABS 0.0 03/30/2013 1020    BMET    Component Value Date/Time   NA 138 04/05/2024 1117   NA 141 03/30/2013 1020   K 4.4 04/05/2024 1117   K 3.9 03/30/2013 1020   CL 102 04/05/2024 1117   CL 105 03/30/2013 1020   CO2 28 04/05/2024 1117   CO2 25 03/30/2013 1020   GLUCOSE 100 (H) 04/05/2024 1117   GLUCOSE 180 (H) 03/30/2013 1020   BUN 12 04/05/2024 1117   BUN 10.3 03/30/2013 1020   CREATININE 0.72 04/05/2024 1117   CREATININE 0.7 03/30/2013 1020   CALCIUM  9.9 04/05/2024 1117   CALCIUM  10.0 03/30/2013 1020   GFRNONAA >90 09/14/2013 0457   GFRAA >90 09/14/2013 0457    BNP No results found for: BNP  ProBNP No results found for: PROBNP  Imaging: No results found.  Assessment & Plan:    1. Obstructive sleep apnea (Primary) - Ambulatory Referral for DME   Assessment and Plan Assessment & Plan Obstructive sleep apnea Severe obstructive  sleep apnea, well-controlled with CPAP therapy. Current pressure 5-15cm h20. Apnea-hypopnea index reduced from 47 to 1.1 with consistent CPAP use. No issues with CPAP machine, though supply renewal has been a hassle. Occasional discomfort with pressure settings, but no significant issues warranting change. Weight loss noted, but CPAP settings remain appropriate - Continue CPAP therapy with current settings. - Ordered CPAP supplies through [employer] and enrolled in auto-renewal. - Consider set pressure of 12 if discomfort becomes regular.  Pre-op respiratory exam Low risk for prolonged mechanical ventilation or post op respiratory complications due to well-controlled sleep apnea.  Chronic sinusitis Episodes of sinus infections, particularly after air travel. CPAP does not alleviate sinus symptoms. Previous CT scan in May showed masses, nodules, effusion or ascites. Discussed potential use of cefdinir  for sinus infections, considering penicillin allergy. - Consider cefdinir  for sinus infections, with instructions to discontinue and take Benadryl  if rash develops.  Pulmonary nodule, right lung Pulmonary nodule in the right lung, monitored due to history of breast cancer. No new findings on recent CT scan. No current respiratory symptoms. - Continue monitoring pulmonary nodule with regular CT scans from outside source     Almarie LELON Ferrari, NP 10/26/2024     [1]  Allergies Allergen Reactions   Ambien  [Zolpidem  Tartrate]     Felt bad hard to function   Amoxicillin     REACTION: unspecified   Bactrim  [Sulfamethoxazole -Trimethoprim ] Other (See Comments)    Lower platelets    Farxiga  [Dapagliflozin ] Other (See Comments)    Sever yeast infection and after had severe ITP    Levaquin [Levofloxacin] Other (See Comments)    Myalgias   Lipitor [Atorvastatin  Calcium ]     Leg cramps   Oxycodone     Penicillins     As child   Prednisone  Itching   Latex Rash   Mupirocin  Rash   Other Rash    use  PAPER tape  [2]  Social History Tobacco Use  Smoking Status Never  Smokeless Tobacco Never   "

## 2024-10-27 ENCOUNTER — Ambulatory Visit: Admitting: Internal Medicine

## 2024-10-27 ENCOUNTER — Encounter: Payer: Self-pay | Admitting: Internal Medicine

## 2024-10-27 VITALS — BP 117/72 | HR 63 | Temp 97.5°F | Ht 63.5 in | Wt 141.8 lb

## 2024-10-27 DIAGNOSIS — Z853 Personal history of malignant neoplasm of breast: Secondary | ICD-10-CM

## 2024-10-27 DIAGNOSIS — Z79899 Other long term (current) drug therapy: Secondary | ICD-10-CM

## 2024-10-27 DIAGNOSIS — M19011 Primary osteoarthritis, right shoulder: Secondary | ICD-10-CM

## 2024-10-27 DIAGNOSIS — Z01818 Encounter for other preprocedural examination: Secondary | ICD-10-CM

## 2024-10-27 DIAGNOSIS — Z862 Personal history of diseases of the blood and blood-forming organs and certain disorders involving the immune mechanism: Secondary | ICD-10-CM

## 2024-10-27 DIAGNOSIS — E1165 Type 2 diabetes mellitus with hyperglycemia: Secondary | ICD-10-CM

## 2024-10-27 DIAGNOSIS — I10 Essential (primary) hypertension: Secondary | ICD-10-CM

## 2024-10-27 LAB — BASIC METABOLIC PANEL WITH GFR
BUN: 19 mg/dL (ref 6–23)
CO2: 29 meq/L (ref 19–32)
Calcium: 10 mg/dL (ref 8.4–10.5)
Chloride: 104 meq/L (ref 96–112)
Creatinine, Ser: 0.78 mg/dL (ref 0.40–1.20)
GFR: 73.21 mL/min
Glucose, Bld: 101 mg/dL — ABNORMAL HIGH (ref 70–99)
Potassium: 4.3 meq/L (ref 3.5–5.1)
Sodium: 140 meq/L (ref 135–145)

## 2024-10-27 LAB — CBC WITH DIFFERENTIAL/PLATELET
Basophils Absolute: 0 K/uL (ref 0.0–0.1)
Basophils Relative: 0.6 % (ref 0.0–3.0)
Eosinophils Absolute: 0.1 K/uL (ref 0.0–0.7)
Eosinophils Relative: 1.9 % (ref 0.0–5.0)
HCT: 44.1 % (ref 36.0–46.0)
Hemoglobin: 15.1 g/dL — ABNORMAL HIGH (ref 12.0–15.0)
Lymphocytes Relative: 17.8 % (ref 12.0–46.0)
Lymphs Abs: 1.2 K/uL (ref 0.7–4.0)
MCHC: 34.3 g/dL (ref 30.0–36.0)
MCV: 91.2 fl (ref 78.0–100.0)
Monocytes Absolute: 0.5 K/uL (ref 0.1–1.0)
Monocytes Relative: 7.7 % (ref 3.0–12.0)
Neutro Abs: 4.8 K/uL (ref 1.4–7.7)
Neutrophils Relative %: 72 % (ref 43.0–77.0)
Platelets: 211 K/uL (ref 150.0–400.0)
RBC: 4.83 Mil/uL (ref 3.87–5.11)
RDW: 13.8 % (ref 11.5–15.5)
WBC: 6.6 K/uL (ref 4.0–10.5)

## 2024-10-27 LAB — POCT GLYCOSYLATED HEMOGLOBIN (HGB A1C): Hemoglobin A1C: 5.2 % (ref 4.0–5.6)

## 2024-10-27 MED ORDER — ALPRAZOLAM 0.25 MG PO TABS: ORAL_TABLET | ORAL | 0 refills | Status: AC

## 2024-10-27 MED ORDER — ROSUVASTATIN CALCIUM 10 MG PO TABS: 10.0000 mg | ORAL_TABLET | ORAL | 1 refills | Status: AC

## 2024-10-27 MED ORDER — LOSARTAN POTASSIUM 25 MG PO TABS: 12.5000 mg | ORAL_TABLET | Freq: Every day | ORAL | 1 refills | Status: AC

## 2024-10-27 NOTE — Patient Instructions (Addendum)
 Good to see you today  Optimize muscle exercises 1 gram protein per kg of body weight  per day 65 grams goal  Check cbc  pt and cmp today  You may require further lab as per surgeon and anesthesia

## 2024-10-27 NOTE — Telephone Encounter (Signed)
 Beth's note from 10/26/24 was faxed to [facility].

## 2024-10-27 NOTE — Progress Notes (Unsigned)
 "  Chief Complaint  Patient presents with   Pre-op Exam    Pt is here for pre-op exam for R Shoulder.    Medication Refill    HPI: Patient  Chelsey [name]  79 y.o. comes in today for pre operative evauation for optimization  CV DM Med refill   R shoulder   reverse .   Fell once  and step at end of curb  no fx .     ROS:  GEN/ HEENT: No fever, significant weight changes sweats headaches vision problems hearing changes, CV/ PULM; No chest pain shortness of breath cough, syncope,edema  change in exercise tolerance. GI /GU: No adominal pain, vomiting, change in bowel habits. No blood in the stool. No significant GU symptoms. SKIN/HEME: ,no acute skin rashes suspicious lesions or bleeding. No lymphadenopathy, nodules, masses.  NEURO/ PSYCH:  No neurologic signs such as weakness numbness. No depression anxiety. IMM/ Allergy: No unusual infections.  Allergy .   REST of 12 system review negative except as per HPI   Past Medical History:  Diagnosis Date   Allergy    Aphthous ulcer of mouth 2010   related to chemotherapy    Breast cancer [facility]) 2010   2010 & 2011   Carpal tunnel syndrome of right wrist    Colitis    Colon polyp    Diabetes mellitus    Diverticulosis    GERD (gastroesophageal reflux disease)    Hyperlipidemia    Hypertension    Lymphedema 08/31/2013   left arm   Osteoarthritis    Pneumonia 07/28/2011   Sinusitis    Sleep apnea    CPAP-setting of 10   Squamous cell carcinoma    left leg    Past Surgical History:  Procedure Laterality Date   AUGMENTATION MAMMAPLASTY Left    CESAREAN SECTION     x 3   COLONOSCOPY  12/02/2018   Dr.Beavers   MASTECTOMY Left 11/23   2010   RECONSTRUCTION BREAST IMMEDIATE / DELAYED W/ TISSUE EXPANDER Left    REDUCTION MAMMAPLASTY Right    SKIN GRAFT     SQUAMOUS CELL CARCINOMA EXCISION Left    Left lower extremity   TONSILLECTOMY     as child   TOTAL KNEE ARTHROPLASTY Left 12/20/2012   Procedure: TOTAL KNEE  ARTHROPLASTY;  Surgeon: Dempsey LULLA Moan, MD;  Location: [facility];  Service: Orthopedics;  Laterality: Left;   TOTAL KNEE ARTHROPLASTY Right 09/12/2013   Procedure: RIGHT TOTAL KNEE ARTHROPLASTY;  Surgeon: Dempsey LULLA Moan, MD;  Location: [facility];  Service: Orthopedics;  Laterality: Right;   TUBAL LIGATION      Family History  Problem Relation Age of Onset   Alzheimer's disease Mother    Sleep apnea Mother    Diabetes Mother    Hypertension Mother    Hyperlipidemia Mother    Breast cancer Mother 67       triple negative   Anxiety disorder Daughter    Hypertension Son        pulmonary   Congenital heart disease Son 0       TGA  died age 15    Colon cancer Neg Hx    Colon polyps Neg Hx    Esophageal cancer Neg Hx    Rectal cancer Neg Hx    Stomach cancer Neg Hx     Social History   Socioeconomic History   Marital status: Married    Spouse name: Not on file   Number of  children: 3   Years of education: Not on file   Highest education level: Not on file  Occupational History   Occupation: realtor    Employer: [employer]   Occupation: Engineer, Drilling: [employer]  Tobacco Use   Smoking status: Never   Smokeless tobacco: Never  Vaping Use   Vaping status: Never Used  Substance and Sexual Activity   Alcohol use: Yes    Alcohol/week: 7.0 - 14.0 standard drinks of alcohol    Types: 7 - 14 Glasses of wine per week    Comment: 1-2 glasses every other night   Drug use: No   Sexual activity: Yes    Birth control/protection: Post-menopausal, Surgical  Other Topics Concern   Not on file  Social History Narrative   cb x 3   Realtor married    Bereaved  Parent   2 adult children   Mother with dementia and breast cancer   Passed away sept 59 15  Age 42   Social Drivers of Health   Tobacco Use: Low Risk (10/27/2024)   Patient History    Smoking Tobacco Use: Never    Smokeless Tobacco Use: Never    Passive Exposure: Not on file  Financial Resource Strain: Low Risk  (03/11/2023)   Overall Financial Resource Strain (CARDIA)    Difficulty of Paying Living Expenses: Not hard at all  Food Insecurity: No Food Insecurity (03/11/2023)   Hunger Vital Sign    Worried About Running Out of Food in the Last Year: Never true    Ran Out of Food in the Last Year: Never true  Transportation Needs: No Transportation Needs (03/11/2023)   PRAPARE - Administrator, Civil Service (Medical): No    Lack of Transportation (Non-Medical): No  Physical Activity: Inactive (03/11/2023)   Exercise Vital Sign    Days of Exercise per Week: 0 days    Minutes of Exercise per Session: 0 min  Stress: Stress Concern Present (03/11/2023)   [employer] of Occupational Health - Occupational Stress Questionnaire    Feeling of Stress : Rather much  Social Connections: Moderately Integrated (03/11/2023)   Social Connection and Isolation Panel    Frequency of Communication with Friends and Family: More than three times a week    Frequency of Social Gatherings with Friends and Family: Once a week    Attends Religious Services: Never    Database Administrator or Organizations: Yes    Attends Engineer, Structural: More than 4 times per year    Marital Status: Married  Depression (PHQ2-9): Low Risk (10/27/2024)   Depression (PHQ2-9)    PHQ-2 Score: 1  Alcohol Screen: Low Risk (03/11/2023)   Alcohol Screen    Last Alcohol Screening Score (AUDIT): 4  Housing: Low Risk (03/11/2023)   Housing    Last Housing Risk Score: 0  Utilities: Not At Risk (03/11/2023)   AHC Utilities    Threatened with loss of utilities: No  Health Literacy: Not on file    Outpatient Medications Prior to Visit  Medication Sig Dispense Refill   albuterol  (VENTOLIN  HFA) 108 (90 Base) MCG/ACT inhaler Inhale 2 puffs into the lungs every 6 (six) hours as needed for wheezing or shortness of breath. 8 g 2   azelastine  (ASTELIN ) 0.1 % nasal spray INSTILL 1 TO 2 SPRAYS INTO EACH NOSTRIL TWICE DAILY AS  NEEDED 30 mL 12   calcium  citrate (CALCITRATE - DOSED IN MG ELEMENTAL CALCIUM ) 950 (200  Ca) MG tablet Take 1 tablet by mouth. Every other day     cyanocobalamin  1000 MCG tablet Take 1 tablet by mouth daily.     esomeprazole  (NEXIUM ) 40 MG capsule Take 40 mg by mouth as needed. Takes bedtime     FLUoxetine  (PROZAC ) 20 MG capsule Take 1 capsule (20 mg total) by mouth 2 (two) times daily. Dosage change 180 capsule 2   metFORMIN  (GLUCOPHAGE -XR) 500 MG 24 hr tablet Take 2 tablets (1,000 mg total) by mouth daily with breakfast. 180 tablet 3   senna (SENOKOT) 8.6 MG tablet Take 1 tablet by mouth as needed for constipation.     tirzepatide  (MOUNJARO ) 7.5 MG/0.5ML Pen Inject 7.5 mg into the skin once a week. Dosage change 6 mL 3   traZODone  (DESYREL ) 50 MG tablet TAKE 1/2 TO 1 TABLET(25 TO 50 MG) BY MOUTH AT BEDTIME AS NEEDED FOR SLEEP 30 tablet 3   ALPRAZolam  (XANAX ) 0.25 MG tablet TAKE 1 TABLET(0.25 MG) BY MOUTH TWICE DAILY AS NEEDED FOR ANXIETY. AVOID REGULAR USE 30 tablet 0   losartan  (COZAAR ) 25 MG tablet Take 0.5 tablets (12.5 mg total) by mouth daily. 45 tablet 1   rosuvastatin  (CRESTOR ) 10 MG tablet Take 1 tablet (10 mg total) by mouth every other day. 90 tablet 1   Facility-Administered Medications Prior to Visit  Medication Dose Route Frequency Provider Last Rate Last Admin   0.9 %  sodium chloride  infusion  500 mL Intravenous Once Beavers, Kimberly, MD         EXAM:  BP 117/72 (BP Location: Right Arm, Patient Position: Sitting, Cuff Size: Normal)   Pulse 63   Temp (!) 97.5 F (36.4 C) (Oral)   Ht 5' 3.5 (1.613 m)   Wt 141 lb 12.8 oz (64.3 kg)   SpO2 99%   BMI 24.72 kg/m   Body mass index is 24.72 kg/m. Wt Readings from Last 3 Encounters:  10/27/24 141 lb 12.8 oz (64.3 kg)  10/26/24 142 lb 12.8 oz (64.8 kg)  04/05/24 151 lb 6.4 oz (68.7 kg)    Physical Exam: Vital signs reviewed HZW:Uypd is a well-developed well-nourished alert cooperative    who appearsr stated age in no  acute distress.  HEENT: normocephalic atraumatic , Eyes: PERRL EOM's full, conjunctiva clear, Nares: paten,t no deformity discharge or tenderness., Ears: no deformity EAC's clear TMs with normal landmarks. Mouth: clear OP, no lesions, edema.  Moist mucous membranes. Dentition in adequate repair. NECK: supple without masses, thyromegaly or bruits. CHEST/PULM:  Clear to auscultation and percussion breath sounds equal no wheeze , rales or rhonchi. No chest wall deformities or tenderness. Breast: normal by inspection . No dimpling, discharge, masses, tenderness or discharge . CV: PMI is nondisplaced, S1 S2 no gallops, murmurs, rubs. Peripheral pulses are full without delay.No JVD .  ABDOMEN: Bowel sounds normal nontender  No guard or rebound, no hepato splenomegal no CVA tenderness.  No hernia. Extremtities:  No clubbing cyanosis or edema, no acute joint swelling or redness no focal atrophy NEURO:  Oriented x3, cranial nerves 3-12 appear to be intact, no obvious focal weakness,gait within normal limits no abnormal reflexes or asymmetrical SKIN: No acute rashes normal turgor, color, no bruising or petechiae. PSYCH: Oriented, good eye contact, no obvious depression anxiety, cognition and judgment appear normal. LN: no cervical axillary iadenopathy  Lab Results  Component Value Date   WBC 5.2 04/05/2024   HGB 14.3 04/05/2024   HCT 42.7 04/05/2024   PLT 177.0 04/05/2024   GLUCOSE  100 (H) 04/05/2024   CHOL 190 04/05/2024   TRIG 96.0 04/05/2024   HDL 79.70 04/05/2024   LDLDIRECT 173.9 11/09/2013   LDLCALC 91 04/05/2024   ALT 12 04/05/2024   AST 17 04/05/2024   NA 138 04/05/2024   K 4.4 04/05/2024   CL 102 04/05/2024   CREATININE 0.72 04/05/2024   BUN 12 04/05/2024   CO2 28 04/05/2024   TSH 1.71 04/05/2024   INR 1.0 06/03/2021   HGBA1C 5.2 10/27/2024   MICROALBUR 2.1 (H) 04/05/2024    BP Readings from Last 3 Encounters:  10/27/24 117/72  10/26/24 138/84  04/05/24 124/70    Lab  results reviewed with patient   ASSESSMENT AND PLAN:  Discussed the following assessment and plan:    ICD-10-CM   1. Pre-op exam  Z01.818     2. Type 2 diabetes mellitus with hyperglycemia, without long-term current use of insulin  (HCC)  E11.65 POC HgB A1c    3. Medication management  Z79.899     4. Essential hypertension  I10     5. History of ITP  Z86.2      No follow-ups on file.  Patient Care Team: Daneshia Tavano, Apolinar POUR, MD as PCP - Diedre Melodi Lerner, MD as Attending Physician (Orthopedic Surgery) Neysa Reggy BIRCH, MD as Consulting Physician (Pulmonary Disease) Octavia Charleston, MD as Consulting Physician (Ophthalmology) Siri Pulling, MD as Physician Assistant (Internal Medicine) Alm Olam SAUNDERS, MD as Referring Physician (Plastic Surgery) Siri Pulling, MD as Physician Assistant (Internal Medicine) Octavia Charlie Hamilton, MD as Consulting Physician (Ophthalmology) Patient Instructions  Good to see you today  Optimize muscle exercises 1 gram protein per kg of body weight  per day 65 grams goal  Check cbclpt and cmp today  You may require furtherlab as per surgeon and anesthesia   Kaliyan Osbourn K. Shady Bradish M.D.  "

## 2024-10-30 ENCOUNTER — Ambulatory Visit: Payer: Self-pay | Admitting: Internal Medicine

## 2024-10-30 NOTE — Progress Notes (Signed)
 Platelet count and renal function are normal  ;forwarding results  to  Dr Sharl

## 2024-11-01 ENCOUNTER — Ambulatory Visit
Admission: RE | Admit: 2024-11-01 | Discharge: 2024-11-01 | Disposition: A | Source: Ambulatory Visit | Attending: Orthopedic Surgery

## 2024-11-01 DIAGNOSIS — M25511 Pain in right shoulder: Secondary | ICD-10-CM
